# Patient Record
Sex: Male | Born: 1945
Health system: Southern US, Community
[De-identification: ages and names within clinical notes are randomized; demographics above are authoritative.]

## PROBLEM LIST (undated history)

## (undated) DIAGNOSIS — M1990 Unspecified osteoarthritis, unspecified site: Secondary | ICD-10-CM

## (undated) DIAGNOSIS — I10 Essential (primary) hypertension: Secondary | ICD-10-CM

## (undated) DIAGNOSIS — K219 Gastro-esophageal reflux disease without esophagitis: Secondary | ICD-10-CM

## (undated) DIAGNOSIS — I2510 Atherosclerotic heart disease of native coronary artery without angina pectoris: Secondary | ICD-10-CM

## (undated) DIAGNOSIS — K449 Diaphragmatic hernia without obstruction or gangrene: Secondary | ICD-10-CM

## (undated) DIAGNOSIS — E785 Hyperlipidemia, unspecified: Secondary | ICD-10-CM

## (undated) DIAGNOSIS — J189 Pneumonia, unspecified organism: Secondary | ICD-10-CM

## (undated) DIAGNOSIS — R3911 Hesitancy of micturition: Secondary | ICD-10-CM

## (undated) DIAGNOSIS — K222 Esophageal obstruction: Secondary | ICD-10-CM

## (undated) DIAGNOSIS — K4090 Unilateral inguinal hernia, without obstruction or gangrene, not specified as recurrent: Secondary | ICD-10-CM

## (undated) DIAGNOSIS — D126 Benign neoplasm of colon, unspecified: Secondary | ICD-10-CM

## (undated) DIAGNOSIS — B958 Unspecified staphylococcus as the cause of diseases classified elsewhere: Secondary | ICD-10-CM

## (undated) DIAGNOSIS — I4891 Unspecified atrial fibrillation: Secondary | ICD-10-CM

## (undated) DIAGNOSIS — R51 Headache: Secondary | ICD-10-CM

## (undated) DIAGNOSIS — K5790 Diverticulosis of intestine, part unspecified, without perforation or abscess without bleeding: Secondary | ICD-10-CM

## (undated) DIAGNOSIS — I499 Cardiac arrhythmia, unspecified: Secondary | ICD-10-CM

## (undated) DIAGNOSIS — N400 Enlarged prostate without lower urinary tract symptoms: Secondary | ICD-10-CM

## (undated) DIAGNOSIS — R0600 Dyspnea, unspecified: Secondary | ICD-10-CM

## (undated) HISTORY — DX: Gastro-esophageal reflux disease without esophagitis: K21.9

## (undated) HISTORY — PX: CARDIOVASCULAR STRESS TEST: SHX262

## (undated) HISTORY — DX: Benign neoplasm of colon, unspecified: D12.6

## (undated) HISTORY — DX: Diaphragmatic hernia without obstruction or gangrene: K44.9

## (undated) HISTORY — DX: Atherosclerotic heart disease of native coronary artery without angina pectoris: I25.10

## (undated) HISTORY — PX: ESOPHAGEAL DILATION: SHX303

## (undated) HISTORY — PX: COLONOSCOPY: SHX174

## (undated) HISTORY — PX: POLYPECTOMY: SHX149

## (undated) HISTORY — DX: Essential (primary) hypertension: I10

## (undated) HISTORY — DX: Hyperlipidemia, unspecified: E78.5

## (undated) HISTORY — DX: Unspecified atrial fibrillation: I48.91

## (undated) HISTORY — DX: Esophageal obstruction: K22.2

## (undated) HISTORY — DX: Unspecified staphylococcus as the cause of diseases classified elsewhere: B95.8

## (undated) HISTORY — DX: Unspecified osteoarthritis, unspecified site: M19.90

## (undated) HISTORY — PX: US ECHOCARDIOGRAPHY: HXRAD669

## (undated) HISTORY — DX: Diverticulosis of intestine, part unspecified, without perforation or abscess without bleeding: K57.90

## (undated) HISTORY — PX: KNEE SURGERY: SHX244

## (undated) HISTORY — DX: Unilateral inguinal hernia, without obstruction or gangrene, not specified as recurrent: K40.90

---
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
## 2001-12-31 ENCOUNTER — Encounter: Payer: Self-pay | Admitting: Gastroenterology

## 2002-06-30 ENCOUNTER — Encounter: Payer: Self-pay | Admitting: Gastroenterology

## 2004-12-05 HISTORY — PX: ANKLE ARTHROPLASTY: SUR68

## 2004-12-24 ENCOUNTER — Ambulatory Visit: Payer: Self-pay | Admitting: Gastroenterology

## 2004-12-27 ENCOUNTER — Ambulatory Visit: Payer: Self-pay | Admitting: Gastroenterology

## 2004-12-28 ENCOUNTER — Encounter: Payer: Self-pay | Admitting: Gastroenterology

## 2005-03-05 HISTORY — PX: CORONARY ANGIOPLASTY WITH STENT PLACEMENT: SHX49

## 2005-03-10 ENCOUNTER — Ambulatory Visit (HOSPITAL_COMMUNITY): Admission: RE | Admit: 2005-03-10 | Discharge: 2005-03-11 | Payer: Self-pay | Admitting: *Deleted

## 2005-03-10 HISTORY — PX: CORONARY STENT PLACEMENT: SHX1402

## 2005-04-05 ENCOUNTER — Ambulatory Visit: Payer: Self-pay | Admitting: Podiatry

## 2005-11-30 ENCOUNTER — Ambulatory Visit: Admission: RE | Admit: 2005-11-30 | Discharge: 2005-11-30 | Payer: Self-pay | Admitting: Surgery

## 2005-11-30 HISTORY — PX: HERNIA REPAIR: SHX51

## 2006-01-24 ENCOUNTER — Ambulatory Visit: Payer: Self-pay | Admitting: Gastroenterology

## 2006-11-04 HISTORY — PX: UMBILICAL HERNIA REPAIR: SHX196

## 2007-12-17 ENCOUNTER — Emergency Department (HOSPITAL_COMMUNITY): Admission: EM | Admit: 2007-12-17 | Discharge: 2007-12-17 | Payer: Self-pay | Admitting: Emergency Medicine

## 2008-01-15 ENCOUNTER — Ambulatory Visit: Payer: Self-pay | Admitting: Gastroenterology

## 2009-01-22 HISTORY — PX: CARDIOVASCULAR STRESS TEST: SHX262

## 2009-02-10 ENCOUNTER — Telehealth: Payer: Self-pay | Admitting: Gastroenterology

## 2009-02-12 ENCOUNTER — Encounter: Payer: Self-pay | Admitting: Gastroenterology

## 2009-03-02 DIAGNOSIS — I10 Essential (primary) hypertension: Secondary | ICD-10-CM | POA: Insufficient documentation

## 2009-03-02 DIAGNOSIS — K649 Unspecified hemorrhoids: Secondary | ICD-10-CM | POA: Insufficient documentation

## 2009-03-02 DIAGNOSIS — K828 Other specified diseases of gallbladder: Secondary | ICD-10-CM

## 2009-03-02 DIAGNOSIS — K219 Gastro-esophageal reflux disease without esophagitis: Secondary | ICD-10-CM

## 2009-03-02 DIAGNOSIS — K449 Diaphragmatic hernia without obstruction or gangrene: Secondary | ICD-10-CM | POA: Insufficient documentation

## 2009-03-02 DIAGNOSIS — K222 Esophageal obstruction: Secondary | ICD-10-CM

## 2009-03-02 DIAGNOSIS — E119 Type 2 diabetes mellitus without complications: Secondary | ICD-10-CM

## 2009-03-02 DIAGNOSIS — I2510 Atherosclerotic heart disease of native coronary artery without angina pectoris: Secondary | ICD-10-CM | POA: Insufficient documentation

## 2009-03-03 ENCOUNTER — Ambulatory Visit: Payer: Self-pay | Admitting: Gastroenterology

## 2009-03-03 DIAGNOSIS — M129 Arthropathy, unspecified: Secondary | ICD-10-CM | POA: Insufficient documentation

## 2009-03-05 DIAGNOSIS — D126 Benign neoplasm of colon, unspecified: Secondary | ICD-10-CM

## 2009-03-05 HISTORY — DX: Benign neoplasm of colon, unspecified: D12.6

## 2009-04-01 ENCOUNTER — Encounter: Payer: Self-pay | Admitting: Gastroenterology

## 2009-04-01 ENCOUNTER — Ambulatory Visit: Payer: Self-pay | Admitting: Gastroenterology

## 2009-04-03 ENCOUNTER — Encounter: Payer: Self-pay | Admitting: Gastroenterology

## 2009-05-01 ENCOUNTER — Ambulatory Visit: Payer: Self-pay | Admitting: Gastroenterology

## 2011-01-21 ENCOUNTER — Ambulatory Visit (INDEPENDENT_AMBULATORY_CARE_PROVIDER_SITE_OTHER): Payer: BC Managed Care – PPO | Admitting: Cardiology

## 2011-01-21 DIAGNOSIS — E7800 Pure hypercholesterolemia, unspecified: Secondary | ICD-10-CM

## 2011-01-21 DIAGNOSIS — E119 Type 2 diabetes mellitus without complications: Secondary | ICD-10-CM

## 2011-01-21 DIAGNOSIS — I2510 Atherosclerotic heart disease of native coronary artery without angina pectoris: Secondary | ICD-10-CM

## 2011-03-16 LAB — GLUCOSE, CAPILLARY
Glucose-Capillary: 100 mg/dL — ABNORMAL HIGH (ref 70–99)
Glucose-Capillary: 116 mg/dL — ABNORMAL HIGH (ref 70–99)

## 2011-04-19 NOTE — Assessment & Plan Note (Signed)
[facility]                         GASTROENTEROLOGY OFFICE NOTE   NAME:[name], [name]                      MRN:          [id number]  DATE:01/15/2008                            DOB:          12-30-1945    HISTORY:  [name] is doing well, but since I saw him has developed  cardiovascular difficulties that required a cardiac stenting by Dr.  Peter M. Sw[place]iland in April 2006.  He continued with acid reflux symptoms  which is managed by taking Prevacid, but he has had problems with his  insurance company, and has recently been on Omeprazole 20 mg twice  daily, with break-through reflux symptoms.  He does have progressive  solid food dysphagia at this time.  His last endoscopy is greater than  five years ago, and he had esophageal dilatation at that time.   In addition to his hypertension and coronary artery disease, he does  have diabetes. He is on multiple medications, including daily aspirin  and NSAIDs.  I have reviewed his multiple medications, and they are  listed in his chart.  He denies lower GI problems at this time.  He is  up-to-date on his colonoscopy, which was last done in December 2003.   The patient denies any hepatobiliary complaints, melena or hematochezia.  His appetite is good and his weight is stable.   PHYSICAL EXAMINATION:  VITAL SIGNS:  Weight 182 pounds, which is his  normal weight.  Blood pressure 136/70, pulse 80 and regular.  I did not  perform a general physical exam at this time.   RECOMMENDATIONS:  1. Switch from Omeprazole to Prevacid 30 mg daily.  2. He is probably going to be a good candidate for a dual-action, time      release Capodex tablet coming out in the next few weeks from [employer].  3. Outpatient endoscopy and esophageal dilatation for his dysphagia.  4. Continue other multiple medications as listed above.     Vania Rea. Jarold Motto, MD, [name], FAGA  Electronically Signed    DRP/MedQ  DD:  01/15/2008  DT: 01/16/2008  Job #: [id number]   cc:   Peter M. [place]il[place], M.D.  Kari Baars, M.D.

## 2011-04-22 NOTE — Cardiovascular Report (Signed)
[name], [name]               ACCOUNT NO.:  [id number]   MEDICAL RECORD NO.:  [id number]          PATIENT TYPE:  OIB   LOCATION:  2899                         FACILITY:  [facility]   PHYSICIAN:  Elmore Guise., M.D.DATE OF BIRTH:  [birth year]   DATE OF PROCEDURE:  03/10/2005  DATE OF DISCHARGE:                              CARDIAC CATHETERIZATION   PROCEDURE:  Cardiac catheterization.   INDICATIONS FOR PROCEDURE:  Chest pain and abnormal stress test.   PROCEDURE DESCRIPTION:  The patient was brought to the cardiac  catheterization lab after appropriate informed consent.  He was prepped and  draped in a sterile fashion.  Approximately 10 cc of 1% lidocaine was used  for local anesthesia.  A 6 French sheath was placed in the right femoral  artery without difficulty.  Coronary angiography and LV angiography were  then performed.  The patient was given 10 mg of IV labetalol secondary to  blood pressure in the 190-200 range, with a significant improvement in his  blood pressure.   FINDINGS:  1.  Left main:  Normal.  2.  LAD:  Proximal luminal irregularities, with a mid 30-40 stenosis prior      to bifurcation with a large diagonal.  The mid-LAD after the bifurcation      has a 40-50% stenosis, and the diagonal after the bifurcation and has      approximately a 40-50% stenosis.  3.  LCX:  Is codominant.  Proximal to midvessel with mild luminal      irregularities.  Distal circumflex has a 50-60% stenosis prior to      bifurcation to a small PDA.  4.  OM1:  Is a large vessel, with no significant disease.  5.  OM2:  Small to moderate-size vessel, with no significant disease.  6.  RCA:  Is codominant, with proximal luminal irregularities, mid 70-80%      stenosis, followed by a mid to distal 95% stenosis.  PDA and distal RCA      fills retrogradely via left-to-right collaterals.  7.  LV:  EF 50-55%.  LVEDP was 18 mmHg.  No pullback gradient across the      aortic valve noted.   IMPRESSION:  1.  Obstructive RCA disease.  2.  Moderate nonobstructive LCX and LAD disease.  3.  Low normal LV systolic function.   PLAN:  Elective PCI of RCA, and aggressive risk factor modification and  medical therapy.      TWK/MEDQ  D:  03/10/2005  T:  03/10/2005  Job:  [id number]

## 2011-04-22 NOTE — Cardiovascular Report (Signed)
[name], [name]               ACCOUNT NO.:  [id number]   MEDICAL RECORD NO.:  [id number]          PATIENT TYPE:  OIB   LOCATION:  2899                         FACILITY:  [facility]   PHYSICIAN:  Peter M. Swaziland, M.D.  DATE OF BIRTH:  03/22/1946   DATE OF PROCEDURE:  03/10/2005  DATE OF DISCHARGE:                              CARDIAC CATHETERIZATION   INDICATIONS FOR PROCEDURE:  A 65 year old white male with recent symptoms of  atypical chest pain and abnormal Cardiolite study.  Cardiac catheterization  performed by Dr. Reyes Ivan demonstrates moderate nonobstructive disease in LAD  and left circumflex territory.  He has sequential high-grade lesions in the  mid right coronary artery and the crux of the right coronary artery that  appear to be suitable for stenting.   EQUIPMENT USED:  6-French FR4 guide, a 0.014 high-torque floppy wire, 2.5 x  15 mm Maverick balloon, a 3.0 x 13 mm CYPHER stent, and a 3.0 x 18 mm CYPHER  stent.   MEDICATIONS:  Double bolus Integrilin followed by continuous IV infusion.  Heparin 6200 units IV.   CONTRAST:  70 mL of Omnipaque.  The patient was hemodynamically stable  throughout.   The sequential lesions in the right coronary artery were crossed easily with  the wire.  We initially pre dilated both lesions using the 2.5 x 15 mm  Maverick balloon dilating the distal lesion to 6 atmospheres and the more  proximal lesion to 10 atmospheres.  We then proceeded to stent both these  lesions.  The more distal lesion was stented using a 3.0 x 13 mm CYPHER  stent deployed to 11 atmospheres and post dilated to 16 atmospheres.  We  then stented the more proximal lesion using a 3.0 x 18 mm CYPHER dilating to  11 atmospheres for deployment and 16 atmospheres post deployment.  This  yielded excellent angiographic result at both sites with 0% residual  stenosis and TIMI grade 3 flow.   FINAL INTERPRETATION:  Successful stenting of the mid and crux of the right  coronary artery.      PMJ/MEDQ  D:  03/10/2005  T:  03/10/2005  Job:  [id number]   cc:   Lina Sar, M.D.

## 2011-04-22 NOTE — Op Note (Signed)
NAMEMARIO, [name]               ACCOUNT NO.:  [id number]   MEDICAL RECORD NO.:  [id number]          PATIENT TYPE:  AMB   LOCATION:  DFTL                         FACILITY:  [facility]   PHYSICIAN:  Wilmon Arms. Corliss Skains, M.D. DATE OF BIRTH:  04/02/1946   DATE OF PROCEDURE:  11/30/2005  DATE OF DISCHARGE:                                 OPERATIVE REPORT   PREOPERATIVE DIAGNOSIS:  Umbilical hernia.   POSTOPERATIVE DIAGNOSIS:  Umbilical hernia.   PROCEDURE:  Umbilical hernia repair.   SURGEON:  Wilmon Arms. Tsuei, M.D.   ANESTHESIA:  General endotracheal.   INDICATIONS:  The patient is a 65 year old male who presents with a painful  bulge at his umbilicus. On examination, the patient had a small umbilical  hernia. The patient also has a upper midline rectus diastasis, but this does  not appear to be bothering him.   DESCRIPTION OF PROCEDURE:  The patient was brought to the operating room and  placed in the supine position on the operating room table. After an adequate  level of general endotracheal anesthesia was obtained, the patient's abdomen  was prepped with Betadine and draped in a sterile fashion. A time out was  then taken to ensure the proper patient and proper procedure. The area  around the umbilicus was infiltrated with 0.25% Marcaine. A curvilinear  incision was made just above his umbilicus. Dissection was carried down into  the fascia. A hernia sac was encountered, and this was dissected free from  the umbilical skin. The hernia sac was dissected free from the edge of the  fascia and was reduced. The defect was very small, measuring less than 1 cm  in diameter. This was then closed with three interrupted 0 Novofil sutures.  These were tied down, and the defect was felt to be firmly closed. The wound  was closed with a deep layer of 3-0 Vicryl and a subcuticular layer of 4-0  Monocryl. Steri-Strips and a clean dressing were applied. The patient was  extubated and brought to the  recovery room in stable condition. All sponge,  instrument, and needle  counts were correct.      Wilmon Arms. Tsuei, M.D.  Electronically Signed     MKT/MEDQ  D:  11/30/2005  T:  11/30/2005  Job:  [id number]

## 2011-04-22 NOTE — H&P (Signed)
[name], [name]               ACCOUNT NO.:  [id number]   MEDICAL RECORD NO.:  [id number]          PATIENT TYPE:  AMB   LOCATION:  DFTL                         FACILITY:  [facility]   PHYSICIAN:  Wilmon Arms. Corliss Skains, M.D. DATE OF BIRTH:  November 16, 1946   DATE OF ADMISSION:  11/30/2005  DATE OF DISCHARGE:                                HISTORY & PHYSICAL   CHIEF COMPLAINT:  Umbilical hernia.   HISTORY OF PRESENT ILLNESS:  The patient is a 65 year old male with a past  medical history significant for coronary artery disease and diabetes who  presents with an occasional painful bulge in his umbilicus.  Patient notices  this mostly when he is working and bending over.  Patient works for himself  and occasionally has to lift heavy things.  He denies any obstructive  symptoms.   MEDICATIONS:  Metformin, Glipizide, aspirin, Protonix, Toprol, lisinopril,  Zyrtec, Flonase, and Vytorin.   ALLERGIES:  Patient states that he is allergic to HYDROCODONE and OXYCODONE  which make him vomit.   PAST MEDICAL HISTORY:  1.  Hiatal hernia.  2.  Gastroesophageal reflux disease.  3.  Diabetes type 2.  4.  Hypertension.  5.  Angina.  6.  Seasonal allergies.  7.  Hypercholesterolemia.   PAST SURGICAL HISTORY:  Cardiac catheterization with placement with two  stents.   SOCIAL HISTORY:  Nonsmoker.  Nondrinker.   FAMILY HISTORY:  Patient states that his father is deceased with TB of the  bone.  Mother is alive with coronary artery disease and Parkinson's.   REVIEW OF SYSTEMS:  Please see the attached review of systems sheet.   PHYSICAL EXAMINATION:  VITAL SIGNS:  Height 5 feet 9 inches, weight 187,  pulse 42, blood pressure 135/79, temperature 97.5.  GENERAL:  This is a well-developed, well-nourished male in no apparent  distress.  HEENT:  EOMI.  Sclerae anicteric.  LUNGS:  Clear to auscultation.  HEART:  Regular rate and rhythm.  No murmurs.  ABDOMEN:  Soft, nontender.  Good bowel sounds.  Patient  has upper midline  rectus diastasis.  He had a small umbilical hernia which is easily reduced.  Defect feels like it is less than 1 cm in diameter.   IMPRESSION:  1.  Umbilical hernia.  2.  Rectus diastasis.  3.  Coronary artery disease.  4.  Diabetes.   PLAN:  I explained to the patient that there is no good surgery to correct  the rectus diastasis.  However, I think his symptoms are mostly from an  umbilical hernia that may have some incarcerated omental fat.  Recommend a  repair either with primary suture repair or a mesh.  I explained the  benefits, the risks of procedure to the patient.  He understands and wishes  to proceed.      Wilmon Arms. Tsuei, M.D.  Electronically Signed     MKT/MEDQ  D:  11/30/2005  T:  11/30/2005  Job:  [id number]

## 2011-04-22 NOTE — Discharge Summary (Signed)
[name], [name]               ACCOUNT NO.:  [id number]   MEDICAL RECORD NO.:  [id number]          PATIENT TYPE:  OIB   LOCATION:  6532                         FACILITY:  [facility]   PHYSICIAN:  Elmore Guise., M.D.DATE OF BIRTH:  [birth year]   DATE OF ADMISSION:  03/10/2005  DATE OF DISCHARGE:  03/11/2005                                 DISCHARGE SUMMARY   DISCHARGE DIAGNOSES:  1.  Coronary artery disease.  2.  Status post successful PCI and stent placement to the RCA.  3.  Diabetes mellitus.  4.  Dyslipidemia.  5.  Hypertension   HISTORY OF PRESENT ILLNESS:  The patient is a very pleasant 65 year old  white male with a past medical history of diabetes, hypertension,  dyslipidemia who presented for evaluation of off-and-on exertional chest  pain. The patient had cardiac workup including stress Cardiolite showing a  inferior wall defect. The patient was admitted for further observation.   HOSPITAL COURSE:  The patient underwent cardiac cath on March 10, 2005  showing moderate, however nonobstructive disease on the left system with  severe obstructive lesions noted in the mid and distal RCA. He underwent  successful PCI of his RCA lesions. Postprocedure he has done well.  He had  no significant chest pain or shortness of breath after his procedure was  complete. His right femoral area has healed nicely with no hematoma and no  bruit.  He will be discharged home to continue the following medications.   DISCHARGE MEDICATIONS:  1.  Aspirin 81 mg daily.  2.  Plavix 75 mg daily.  3.  Toprol XL 12.5 mg daily.  4.  Altace 2.5 mg daily.  5.  Vytorin mg daily.  6.  Nitroglycerin p.r.n.  7.  Pain management is Tylenol Extra Strength one to two every 6 hours as      needed.   ACTIVITY:  The patient was instructed in no strenuous activity or heavy  lifting times 48 hours.  His diet will be an ADA 2000 calorie diet.  He can  resume his Metformin after 48 hours at his prior dose.  He  will follow up in  two weeks at [facility] at a scheduled appointment on March 25, 2005.  Discharge labs shows a hemoglobin of 14.1, platelets of 127 and BUN  and creatinine of 10, 0.9.     TWK/MEDQ  D:  03/11/2005  T:  03/11/2005  Job:  [id number]

## 2011-06-13 ENCOUNTER — Ambulatory Visit (INDEPENDENT_AMBULATORY_CARE_PROVIDER_SITE_OTHER): Payer: BC Managed Care – PPO | Admitting: Gastroenterology

## 2011-06-13 ENCOUNTER — Encounter: Payer: Self-pay | Admitting: Gastroenterology

## 2011-06-13 VITALS — BP 142/76 | HR 88 | Ht 69.0 in | Wt 163.0 lb

## 2011-06-13 DIAGNOSIS — Z860100 Personal history of colon polyps, unspecified: Secondary | ICD-10-CM | POA: Insufficient documentation

## 2011-06-13 DIAGNOSIS — K219 Gastro-esophageal reflux disease without esophagitis: Secondary | ICD-10-CM

## 2011-06-13 DIAGNOSIS — R195 Other fecal abnormalities: Secondary | ICD-10-CM

## 2011-06-13 MED ORDER — PEG-KCL-NACL-NASULF-NA ASC-C 100 G PO SOLR: 1.0000 | Freq: Once | ORAL | Status: DC

## 2011-06-13 NOTE — Patient Instructions (Signed)
Your procedure has been scheduled for 07/27/2011, please follow the seperate instructions.  Your prescription(s) have been sent to you pharmacy.

## 2011-06-13 NOTE — Progress Notes (Signed)
This is a extremely pleasant 65 year old Caucasian male that'll onset diabetes, coronary artery disease with previous angioplasty, hyperlipidemia, chronic GERD, and a history of chronic colon polyposis. He had a removal of a large polyp 2 years ago. Recent Hemoccult cards were guaiac positive but the patient was not anemic denies any gastrointestinal issues. He has had some voluntary weight loss. His acid reflux is well controlled with Prevacid 15 mg a day.  Current Medications, Allergies, Past Medical History, Past Surgical History, Family History and Social History were reviewed in [employer] record.  Pertinent Review of Systems Negative   Physical Exam: I cannot appreciate stigmata of chronic liver disease. Chest is clear he appears to be in irregular rhythm without murmurs gallops or rubs. There is no organomegaly, abdominal masses or tenderness. Bowel sounds are normal. Rectal exam was deferred. Peripheral extremities are unremarkable mental status is normal.    Assessment and Plan: Probable recurrent colonic polyposis. Recently the patient did have some erosive gastritis at the time of endoscopy, but is not on NSAIDs at this time. I have scheduled colonoscopy at his convenience, and is to continue all other medications per Dr. Clelia Croft. Encounter Diagnosis  Name Primary?  . Personal history of colonic polyps Yes

## 2011-06-14 ENCOUNTER — Encounter: Payer: Self-pay | Admitting: Gastroenterology

## 2011-07-27 ENCOUNTER — Encounter: Payer: Self-pay | Admitting: Gastroenterology

## 2011-07-27 ENCOUNTER — Ambulatory Visit (AMBULATORY_SURGERY_CENTER): Payer: BC Managed Care – PPO | Admitting: Gastroenterology

## 2011-07-27 DIAGNOSIS — Z86010 Personal history of colonic polyps: Secondary | ICD-10-CM

## 2011-07-27 DIAGNOSIS — D126 Benign neoplasm of colon, unspecified: Secondary | ICD-10-CM

## 2011-07-27 DIAGNOSIS — K5730 Diverticulosis of large intestine without perforation or abscess without bleeding: Secondary | ICD-10-CM

## 2011-07-27 DIAGNOSIS — R195 Other fecal abnormalities: Secondary | ICD-10-CM

## 2011-07-27 LAB — GLUCOSE, CAPILLARY: Glucose-Capillary: 112 mg/dL — ABNORMAL HIGH (ref 70–99)

## 2011-07-27 MED ORDER — SODIUM CHLORIDE 0.9 % IV SOLN: 500.0000 mL | INTRAVENOUS | Status: DC

## 2011-07-27 NOTE — Patient Instructions (Signed)
Discharge instructions given with verbal understanding.  Handouts on polyps and diverticulosis given.  Resume previous medications. 

## 2011-07-28 ENCOUNTER — Telehealth: Payer: Self-pay | Admitting: *Deleted

## 2011-07-28 NOTE — Telephone Encounter (Signed)

## 2011-08-01 ENCOUNTER — Encounter: Payer: Self-pay | Admitting: Gastroenterology

## 2011-08-25 LAB — COMPREHENSIVE METABOLIC PANEL
Alkaline Phosphatase: 50
BUN: 15
Chloride: 99
Creatinine, Ser: 1.11
Glucose, Bld: 162 — ABNORMAL HIGH
Potassium: 4.1
Total Bilirubin: 0.8
Total Protein: 6.7

## 2011-08-25 LAB — URINALYSIS, ROUTINE W REFLEX MICROSCOPIC
Ketones, ur: NEGATIVE
Nitrite: NEGATIVE
Protein, ur: NEGATIVE
pH: 8

## 2011-10-17 ENCOUNTER — Encounter (INDEPENDENT_AMBULATORY_CARE_PROVIDER_SITE_OTHER): Payer: Self-pay | Admitting: General Surgery

## 2011-10-17 ENCOUNTER — Encounter (INDEPENDENT_AMBULATORY_CARE_PROVIDER_SITE_OTHER): Payer: Self-pay | Admitting: Surgery

## 2011-10-18 ENCOUNTER — Encounter (INDEPENDENT_AMBULATORY_CARE_PROVIDER_SITE_OTHER): Payer: Self-pay | Admitting: General Surgery

## 2011-10-18 ENCOUNTER — Encounter (INDEPENDENT_AMBULATORY_CARE_PROVIDER_SITE_OTHER): Payer: Self-pay | Admitting: Surgery

## 2011-10-18 ENCOUNTER — Ambulatory Visit (INDEPENDENT_AMBULATORY_CARE_PROVIDER_SITE_OTHER): Payer: BC Managed Care – PPO | Admitting: Surgery

## 2011-10-18 VITALS — BP 166/70 | HR 80 | Temp 97.6°F | Resp 20 | Ht 68.5 in | Wt 155.0 lb

## 2011-10-18 DIAGNOSIS — K4090 Unilateral inguinal hernia, without obstruction or gangrene, not specified as recurrent: Secondary | ICD-10-CM | POA: Insufficient documentation

## 2011-10-18 NOTE — Progress Notes (Signed)
Chief Complaint  Patient presents with  . New Evaluation    eval of RIH     HPI [name] is a 65 y.o. male. This is a former patient of mine status post umbilical hernia repair in 2007. He presents with a one-year history of an enlarging mass in his right groin. This has become very large and uncomfortable. It remains reducible. He denies any obstructive symptoms. The patient was recently seen by Dr. Clelia Croft who referred him back for surgical evaluation. HPI  Past Medical History  Diagnosis Date  . Arthritis   . Diabetes mellitus   . CAD (coronary artery disease)   . Hypertension   . Hemorrhoids   . Esophageal stricture   . GERD (gastroesophageal reflux disease)   . Hiatal hernia   . Arthritis   . 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 Diverticulosis   . Hyperlipidemia   . Staph infection     Left ankle  . Hiatal hernia   . CHF (congestive heart failure)   . Hernia, inguinal, right     Past Surgical History  Procedure Date  . Tonsillectomy 1958  . Coronary angioplasty with stent placement 03/2005  . Knee surgery     Right  . Umbilical hernia repair 11/2006  . Colonoscopy   . Polypectomy   . Hernia repair 11/30/2005    umbilical hernia    Family History  Problem Relation Age of Onset  . Heart disease Mother   . Heart disease Sister   . Heart disease Brother   . Heart disease Maternal Grandfather   . Ovarian cancer Paternal Grandmother   . Colon cancer Neg Hx   . Esophageal cancer Neg Hx   . Stomach cancer Neg Hx     Social History History  Substance Use Topics  . Smoking status: Former Games developer  . Smokeless tobacco: Never Used  . Alcohol Use: No    Allergies  Allergen Reactions  . Codeine   . Ramipril     Current Outpatient Prescriptions  Medication Sig Dispense Refill  . calcium gluconate 500 MG tablet Take 500 mg by mouth daily.        . cetirizine (ZYRTEC) 10 MG tablet Take 10 mg by mouth daily.        . Coenzyme Q10 (CO Q-10 PO) Take by  mouth daily.        Marland Kitchen ezetimibe (ZETIA) 10 MG tablet Take 10 mg by mouth daily.        . fluticasone (FLONASE) 50 MCG/ACT nasal spray Place 2 sprays into the nose daily.        Marland Kitchen glipiZIDE (GLUCOTROL) 5 MG tablet Take 5 mg by mouth 2 (two) times daily before a meal.        . Magnesium (MAGNACAPS) 100 MG CAPS Take 1 capsule by mouth daily.        . metFORMIN (GLUCOPHAGE) 1000 MG tablet Take 1,000 mg by mouth 2 (two) times daily with a meal.        . Tamsulosin HCl (FLOMAX) 0.4 MG CAPS Take 0.4 mg by mouth daily.        . vitamin C (ASCORBIC ACID) 500 MG tablet Take 500 mg by mouth daily.        Marland Kitchen zinc gluconate 50 MG tablet Take 50 mg by mouth daily.        . Aspirin-Acetaminophen (GOODYS BODY PAIN PO) Take by mouth as needed.        . lansoprazole (  PREVACID) 15 MG capsule Take 15 mg by mouth daily.          Review of Systems Review of Systems  Constitutional: Negative for fever, chills and unexpected weight change.  HENT: Negative for hearing loss, congestion, sore throat, trouble swallowing and voice change.   Eyes: Negative for visual disturbance.  Respiratory: Negative for cough and wheezing.   Cardiovascular: Negative for chest pain, palpitations and leg swelling.  Gastrointestinal: Positive for abdominal pain. Negative for nausea, vomiting, diarrhea, constipation, blood in stool, abdominal distention, anal bleeding and rectal pain.  Genitourinary: Positive for difficulty urinating. Negative for hematuria.  Musculoskeletal: Positive for arthralgias.  Skin: Negative for rash and wound.  Neurological: Positive for headaches. Negative for seizures, syncope and weakness.  Hematological: Negative for adenopathy. Bruises/bleeds easily.  Psychiatric/Behavioral: Negative for confusion.    Blood pressure 166/70, pulse 80, temperature 97.6 F (36.4 C), temperature source Temporal, resp. rate 20, height 5' 8.5" (1.74 m), weight 155 lb (70.308 kg).  Physical Exam Physical Exam WDWN in  NAD HEENT:  EOMI, sclera anicteric Neck:  No masses, no thyromegaly Lungs:  CTA bilaterally; normal respiratory effort CV:  Regular rate and rhythm; no murmurs Abd:  +bowel sounds, soft, non-tender, no masses No recurrent umbilical hernia GU:  Bilateral descended testes; very large reducible right inguinal hernia.  No sign of left inguinal hernia Ext:  Well-perfused; no edema Skin:  Warm, dry; no sign of jaundice  Data Reviewed   Assessment    Large reducible right inguinal hernia    Plan    Cardiac clearance from Dr. [place] Recommend right inguinal hernia repair with mesh.   I described the procedure in detail.  The patient was given educational material. We discussed the risks and benefits including but not limited to bleeding, infection, chronic inguinal pain, nerve entrapment, hernia recurrence, mesh complications, hematoma formation, urinary retention, injury to the testicles, numbness in the groin, blood clots, injury to the surrounding structures, and anesthesia risk. We also discussed the typical post operative recovery course.  The likelihood of successful hernia repair and return to normal activity is good.        [name] K. 10/18/2011, 5:18 PM

## 2011-10-18 NOTE — Patient Instructions (Signed)
Hold your aspirin and herbal supplements for five days before surgery.

## 2011-11-03 ENCOUNTER — Encounter: Payer: Self-pay | Admitting: Cardiology

## 2011-11-07 ENCOUNTER — Telehealth (INDEPENDENT_AMBULATORY_CARE_PROVIDER_SITE_OTHER): Payer: Self-pay | Admitting: General Surgery

## 2011-11-07 ENCOUNTER — Encounter: Payer: Self-pay | Admitting: *Deleted

## 2011-11-07 ENCOUNTER — Ambulatory Visit (INDEPENDENT_AMBULATORY_CARE_PROVIDER_SITE_OTHER): Payer: BC Managed Care – PPO | Admitting: Cardiology

## 2011-11-07 ENCOUNTER — Other Ambulatory Visit (INDEPENDENT_AMBULATORY_CARE_PROVIDER_SITE_OTHER): Payer: Self-pay | Admitting: General Surgery

## 2011-11-07 ENCOUNTER — Encounter: Payer: Self-pay | Admitting: Cardiology

## 2011-11-07 VITALS — BP 142/80 | HR 66 | Ht 68.0 in | Wt 166.1 lb

## 2011-11-07 DIAGNOSIS — E785 Hyperlipidemia, unspecified: Secondary | ICD-10-CM | POA: Insufficient documentation

## 2011-11-07 DIAGNOSIS — I10 Essential (primary) hypertension: Secondary | ICD-10-CM | POA: Insufficient documentation

## 2011-11-07 DIAGNOSIS — E119 Type 2 diabetes mellitus without complications: Secondary | ICD-10-CM | POA: Insufficient documentation

## 2011-11-07 DIAGNOSIS — I2510 Atherosclerotic heart disease of native coronary artery without angina pectoris: Secondary | ICD-10-CM

## 2011-11-07 NOTE — Assessment & Plan Note (Signed)
Blood pressure control is acceptable. 

## 2011-11-07 NOTE — Patient Instructions (Signed)
Continue your medications.  We will clear you for hernia surgery.  I will see you again in 1 year.

## 2011-11-07 NOTE — Progress Notes (Signed)
[name] Date of Birth: 10-19-1946 Medical Record #[id number]  History of Present Illness: [name] is seen today for yearly followup and for preoperative clearance for inguinal hernia repair. He reports that he is doing very well from a cardiac standpoint. He denies any chest pain, shortness of breath, or palpitations. He is very active doing his yardwork. He is status post stenting of the right coronary in 2006 with a Cypher stent. His last nuclear stress test in February 2010 showed no ischemia. Ejection fraction was 51%.  Current Outpatient Prescriptions on File Prior to Visit  Medication Sig Dispense Refill  . Aspirin-Acetaminophen (GOODYS BODY PAIN PO) Take by mouth as needed.        . calcium gluconate 500 MG tablet Take 500 mg by mouth daily.        . cetirizine (ZYRTEC) 10 MG tablet Take 10 mg by mouth daily.        . Coenzyme Q10 (CO Q-10 PO) Take by mouth daily.        Marland Kitchen ezetimibe (ZETIA) 10 MG tablet Take 10 mg by mouth daily.        . fluticasone (FLONASE) 50 MCG/ACT nasal spray Place 2 sprays into the nose daily.        Marland Kitchen glipiZIDE (GLUCOTROL) 5 MG tablet Take 5 mg by mouth 2 (two) times daily before a meal.        . lansoprazole (PREVACID) 15 MG capsule Take 15 mg by mouth daily.        . Magnesium (MAGNACAPS) 100 MG CAPS Take 1 capsule by mouth daily.        . metFORMIN (GLUCOPHAGE) 1000 MG tablet Take 1,000 mg by mouth 2 (two) times daily with a meal.        . Tamsulosin HCl (FLOMAX) 0.4 MG CAPS Take 0.4 mg by mouth daily.        . vitamin C (ASCORBIC ACID) 500 MG tablet Take 500 mg by mouth daily.        Marland Kitchen zinc gluconate 50 MG tablet Take 50 mg by mouth daily.          Allergies  Allergen Reactions  . Codeine   . Ramipril     Past Medical History  Diagnosis Date  . Arthritis   . Diabetes mellitus   . CAD (coronary artery disease)   . Hypertension   . Hemorrhoids   . Esophageal stricture   . GERD (gastroesophageal reflux disease)   . Hiatal hernia     . Arthritis   . 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 Diverticulosis   . Hyperlipidemia   . Staph infection     Left ankle  . Hernia, inguinal, right     Past Surgical History  Procedure Date  . Tonsillectomy 1958  . Coronary angioplasty with stent placement 03/2005  . Knee surgery     Right  . Umbilical hernia repair 11/2006  . Colonoscopy   . Polypectomy   . Hernia repair 11/30/2005    umbilical hernia  . Coronary stent placement 03/10/2005    SUCCESSFUL STENTING OF THE MID AND CRUX OF THE RCA  . US echocardiography 02/25/2005    EF 50-55%  . Cardiovascular stress test 01/22/2009    EF 51%, NO ISCHEMIA    History  Smoking status  . Former Smoker  Smokeless tobacco  . Never Used    History  Alcohol Use No    Family History  Problem Relation Age of  Onset  . Heart disease Mother   . Heart disease Sister   . Heart disease Brother   . Heart disease Maternal Grandfather   . Ovarian cancer Paternal Grandmother   . Colon cancer Neg Hx   . Esophageal cancer Neg Hx   . Stomach cancer Neg Hx     Review of Systems: The review of systems is positive for significant bulge from his inguinal hernia on the right. He reports his last cholesterol was 169 with an A1c of 5.7%.  All other systems were reviewed and are negative.  Physical Exam: BP 142/80  Pulse 66  Ht 5\' 8"  (1.727 m)  Wt 166 lb 1.9 oz (75.352 kg)  BMI 25.26 kg/m2 The patient is alert and oriented x 3.  The mood and affect are normal.  The skin is warm and dry.  Color is normal.  The HEENT exam reveals that the sclera are nonicteric.  The mucous membranes are moist.  The carotids are 2+ without bruits.  There is no thyromegaly.  There is no JVD.  The lungs are clear.  The chest wall is non tender.  The heart exam reveals a regular rate with a normal S1 and S2.  There are no murmurs, gallops, or rubs.  The PMI is not displaced.   Abdominal exam reveals good bowel sounds.  There is no guarding or rebound.  There  is no hepatosplenomegaly or tenderness.  There are no masses.  Exam of the legs reveal no clubbing, cyanosis, or edema.  The legs are without rashes.  The distal pulses are intact.  Cranial nerves II - XII are intact.  Motor and sensory functions are intact.  The gait is normal.  LABORATORY DATA: ECG today demonstrates normal sinus rhythm with Q waves in leads 3 and aVF. 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  Assessment / Plan:

## 2011-11-07 NOTE — Telephone Encounter (Signed)
Called pt and he is aware that I got cardiac clearance from Dr Swaziland and that I sent orders up to scheduling

## 2011-11-07 NOTE — Assessment & Plan Note (Signed)
He is status post remote stenting of the right coronary in 2006. His stress Myoview study in February 2010 was unremarkable. He is asymptomatic. I will recommend continued risk factor modification. He will be cleared for his planned hernia repair. I'll plan a followup again in one year.

## 2011-11-09 ENCOUNTER — Encounter: Payer: Self-pay | Admitting: *Deleted

## 2011-11-09 ENCOUNTER — Encounter (INDEPENDENT_AMBULATORY_CARE_PROVIDER_SITE_OTHER): Payer: Self-pay

## 2011-11-21 ENCOUNTER — Encounter (HOSPITAL_COMMUNITY)
Admission: RE | Admit: 2011-11-21 | Discharge: 2011-11-21 | Payer: BC Managed Care – PPO | Source: Ambulatory Visit | Attending: Surgery | Admitting: Surgery

## 2011-11-21 ENCOUNTER — Encounter (HOSPITAL_COMMUNITY): Payer: Self-pay | Admitting: Pharmacy Technician

## 2011-11-21 ENCOUNTER — Encounter (HOSPITAL_COMMUNITY): Payer: Self-pay

## 2011-11-21 ENCOUNTER — Encounter (HOSPITAL_COMMUNITY)
Admission: RE | Admit: 2011-11-21 | Discharge: 2011-11-21 | Disposition: A | Discharge status: Home / Self Care | Payer: BC Managed Care – PPO | Source: Ambulatory Visit | Attending: Anesthesiology | Admitting: Anesthesiology

## 2011-11-21 HISTORY — DX: Cardiac arrhythmia, unspecified: I49.9

## 2011-11-21 HISTORY — DX: Headache: R51

## 2011-11-21 HISTORY — DX: Hesitancy of micturition: R39.11

## 2011-11-21 HISTORY — DX: Benign prostatic hyperplasia without lower urinary tract symptoms: N40.0

## 2011-11-21 LAB — SURGICAL PCR SCREEN: MRSA, PCR: NEGATIVE

## 2011-11-21 LAB — CBC
MCH: 28.6 pg (ref 26.0–34.0)
Platelets: 174 10*3/uL (ref 150–400)
RBC: 4.75 MIL/uL (ref 4.22–5.81)
WBC: 6.8 10*3/uL (ref 4.0–10.5)

## 2011-11-21 LAB — BASIC METABOLIC PANEL
Calcium: 9.9 mg/dL (ref 8.4–10.5)
GFR calc non Af Amer: 86 mL/min — ABNORMAL LOW (ref >90)
Glucose, Bld: 76 mg/dL (ref 70–99)
Sodium: 142 mEq/L (ref 135–145)

## 2011-11-21 NOTE — Pre-Procedure Instructions (Addendum)
20 CHAVIS TESSLER  11/21/2011   Your procedure is scheduled on: 11-28-2011 @ 7:30 AM Report to [facility] at 5:30 AM .  Call this number if you have problems the morning of surgery: [phone]   Remember:   Do not eat food:After Midnight.  May have clear liquids: up to 4 Hours before arrival. Until 1:30AM  Clear liquids include soda, tea, black coffee, apple or grape juice, broth.  Take these medicines the morning of surgery with A SIP OF WATER:flonase, previcid, Metoprolol   Do not wear jewelry, make-up or nail polish.  Do not wear lotions, powders, or perfumes. You may wear deodorant.  Do not shave 48 hours prior to surgery.  Do not bring valuables to the hospital.  Contacts, dentures or bridgework may not be worn into surgery.  Leave suitcase in the car. After surgery it may be brought to your room.  For patients admitted to the hospital, checkout time is 11:00 AM the day of discharge.   Patients discharged the day of surgery will not be allowed to drive home.  Name and phone number of your driver:  Special Instructions: CHG Shower Use Special Wash: 1/2 bottle night before surgery and 1/2 bottle morning of surgery.   Please read over the following fact sheets that you were given: Pain Booklet, Coughing and Deep Breathing, MRSA Information and Surgical Site Infection Prevention

## 2011-11-21 NOTE — Progress Notes (Addendum)
Requested stress test from [facility]. Has cardiac clearance from Dr. Swaziland on file.   Left for anesthesiology to review.

## 2011-11-22 NOTE — Consult Note (Signed)
Anesthesia:  Patient is a 65 year old male scheduled for right IHR on 11/28/11.  His history includes DM2, HTN, esophageal stricture, GERD, HH, HLD, headaches, BPH, former smoker, and CAD s/p RCA Cypher stent 2006.  His last stress test was done in February 2010 and showed diaphragmatic attenuation artifact, primarily fixed inferior wall defect likely secondary to attenuation but old infarct cannot be excluded, normal LV systolic function with EF 51%.  His last echo and cath were in 2006 (copies on chart).  He saw Dr. Swaziland for routine follow-up and surgical clearance on 11/07/11.  EKG then showed SR with PACs and changes c/w inferior infarct, age undetermined.  Labs acceptable for OR.  CXR showing stable mild cardiomegaly and chronic bronchitis, but no definite  active process noted.  Plan to proceed.

## 2011-11-27 MED ORDER — CEFAZOLIN SODIUM 1-5 GM-% IV SOLN
1.0000 g | INTRAVENOUS | Status: AC
  Administered 2011-11-28: 1 g via INTRAVENOUS
  Filled 2011-11-27: qty 50

## 2011-11-28 ENCOUNTER — Other Ambulatory Visit (INDEPENDENT_AMBULATORY_CARE_PROVIDER_SITE_OTHER): Payer: Self-pay | Admitting: Surgery

## 2011-11-28 ENCOUNTER — Ambulatory Visit (HOSPITAL_COMMUNITY): Payer: BC Managed Care – PPO | Admitting: Vascular Surgery

## 2011-11-28 ENCOUNTER — Encounter (HOSPITAL_COMMUNITY): Payer: Self-pay | Admitting: Vascular Surgery

## 2011-11-28 ENCOUNTER — Ambulatory Visit (HOSPITAL_COMMUNITY)
Admission: RE | Admit: 2011-11-28 | Discharge: 2011-11-28 | Disposition: A | Discharge status: Home / Self Care | Payer: BC Managed Care – PPO | Source: Ambulatory Visit | Attending: Surgery | Admitting: Surgery

## 2011-11-28 ENCOUNTER — Encounter (HOSPITAL_COMMUNITY): Payer: Self-pay | Admitting: *Deleted

## 2011-11-28 ENCOUNTER — Encounter (HOSPITAL_COMMUNITY)
Admission: RE | Disposition: A | Discharge status: Home / Self Care | Payer: Self-pay | Source: Ambulatory Visit | Attending: Surgery

## 2011-11-28 DIAGNOSIS — I2510 Atherosclerotic heart disease of native coronary artery without angina pectoris: Secondary | ICD-10-CM | POA: Insufficient documentation

## 2011-11-28 DIAGNOSIS — I4891 Unspecified atrial fibrillation: Secondary | ICD-10-CM | POA: Insufficient documentation

## 2011-11-28 DIAGNOSIS — E119 Type 2 diabetes mellitus without complications: Secondary | ICD-10-CM | POA: Insufficient documentation

## 2011-11-28 DIAGNOSIS — Z01812 Encounter for preprocedural laboratory examination: Secondary | ICD-10-CM | POA: Insufficient documentation

## 2011-11-28 DIAGNOSIS — K449 Diaphragmatic hernia without obstruction or gangrene: Secondary | ICD-10-CM | POA: Insufficient documentation

## 2011-11-28 DIAGNOSIS — I10 Essential (primary) hypertension: Secondary | ICD-10-CM | POA: Insufficient documentation

## 2011-11-28 DIAGNOSIS — K4090 Unilateral inguinal hernia, without obstruction or gangrene, not specified as recurrent: Secondary | ICD-10-CM

## 2011-11-28 DIAGNOSIS — Z01818 Encounter for other preprocedural examination: Secondary | ICD-10-CM | POA: Insufficient documentation

## 2011-11-28 DIAGNOSIS — K219 Gastro-esophageal reflux disease without esophagitis: Secondary | ICD-10-CM | POA: Insufficient documentation

## 2011-11-28 HISTORY — PX: INGUINAL HERNIA REPAIR: SHX194

## 2011-11-28 LAB — GLUCOSE, CAPILLARY: Glucose-Capillary: 104 mg/dL — ABNORMAL HIGH (ref 70–99)

## 2011-11-28 SURGERY — REPAIR, HERNIA, INGUINAL, ADULT
Anesthesia: General | Site: Groin | Laterality: Right | Wound class: Clean

## 2011-11-28 MED ORDER — ONDANSETRON HCL 4 MG/2ML IJ SOLN
INTRAMUSCULAR | Status: DC | PRN
  Administered 2011-11-28: 4 mg via INTRAVENOUS

## 2011-11-28 MED ORDER — PROPOFOL 10 MG/ML IV EMUL
INTRAVENOUS | Status: DC | PRN
  Administered 2011-11-28: 40 mg via INTRAVENOUS
  Administered 2011-11-28: 50 mg via INTRAVENOUS
  Administered 2011-11-28: 200 mg via INTRAVENOUS

## 2011-11-28 MED ORDER — EPHEDRINE SULFATE 50 MG/ML IJ SOLN
INTRAMUSCULAR | Status: DC | PRN
  Administered 2011-11-28 (×5): 5 mg via INTRAVENOUS

## 2011-11-28 MED ORDER — LACTATED RINGERS IV SOLN
INTRAVENOUS | Status: DC | PRN
  Administered 2011-11-28 (×2): via INTRAVENOUS

## 2011-11-28 MED ORDER — OXYCODONE-ACETAMINOPHEN 5-325 MG PO TABS: 1.0000 | ORAL_TABLET | ORAL | Status: DC | PRN

## 2011-11-28 MED ORDER — DEXTROSE 50 % IV SOLN
INTRAVENOUS | Status: DC | PRN
  Administered 2011-11-28: 12.5 mL via INTRAVENOUS

## 2011-11-28 MED ORDER — OXYCODONE-ACETAMINOPHEN 5-325 MG PO TABS: 1.0000 | ORAL_TABLET | ORAL | Status: AC | PRN

## 2011-11-28 MED ORDER — HYDROMORPHONE HCL PF 1 MG/ML IJ SOLN: 1.0000 mg | INTRAMUSCULAR | Status: DC | PRN

## 2011-11-28 MED ORDER — DROPERIDOL 2.5 MG/ML IJ SOLN
INTRAMUSCULAR | Status: DC | PRN
  Administered 2011-11-28: 0.625 mg via INTRAVENOUS

## 2011-11-28 MED ORDER — TRAMADOL HCL 50 MG PO TABS: 50.0000 mg | ORAL_TABLET | Freq: Four times a day (QID) | ORAL | Status: DC | PRN

## 2011-11-28 MED ORDER — ONDANSETRON HCL 4 MG/2ML IJ SOLN: 4.0000 mg | INTRAMUSCULAR | Status: DC | PRN

## 2011-11-28 MED ORDER — ONDANSETRON HCL 4 MG/2ML IJ SOLN: 4.0000 mg | Freq: Once | INTRAMUSCULAR | Status: DC | PRN

## 2011-11-28 MED ORDER — LIDOCAINE HCL (CARDIAC) 20 MG/ML IV SOLN
INTRAVENOUS | Status: DC | PRN
  Administered 2011-11-28: 50 mg via INTRAVENOUS

## 2011-11-28 MED ORDER — BUPIVACAINE LIPOSOME 1.3 % IJ SUSP
20.0000 mL | INTRAMUSCULAR | Status: AC
  Administered 2011-11-28: 20 mL
  Filled 2011-11-28: qty 20

## 2011-11-28 MED ORDER — HYDROMORPHONE HCL PF 1 MG/ML IJ SOLN
0.2500 mg | INTRAMUSCULAR | Status: DC | PRN
  Administered 2011-11-28 (×2): 0.5 mg via INTRAVENOUS

## 2011-11-28 MED ORDER — 0.9 % SODIUM CHLORIDE (POUR BTL) OPTIME
TOPICAL | Status: DC | PRN
  Administered 2011-11-28: 1000 mL

## 2011-11-28 MED ORDER — FENTANYL CITRATE 0.05 MG/ML IJ SOLN
INTRAMUSCULAR | Status: DC | PRN
  Administered 2011-11-28: 25 ug via INTRAVENOUS
  Administered 2011-11-28: 50 ug via INTRAVENOUS
  Administered 2011-11-28: 25 ug via INTRAVENOUS
  Administered 2011-11-28 (×3): 50 ug via INTRAVENOUS

## 2011-11-28 MED ORDER — SCOPOLAMINE 1 MG/3DAYS TD PT72
MEDICATED_PATCH | TRANSDERMAL | Status: DC | PRN
  Administered 2011-11-28: 1 via TRANSDERMAL

## 2011-11-28 SURGICAL SUPPLY — 53 items
APL SKNCLS STERI-STRIP NONHPOA (GAUZE/BANDAGES/DRESSINGS) ×1
BENZOIN TINCTURE PRP APPL 2/3 (GAUZE/BANDAGES/DRESSINGS) ×2 IMPLANT
BLADE SURG 15 STRL LF DISP TIS (BLADE) ×1 IMPLANT
BLADE SURG 15 STRL SS (BLADE) ×2
BLADE SURG ROTATE 9660 (MISCELLANEOUS) ×1 IMPLANT
CHLORAPREP W/TINT 26ML (MISCELLANEOUS) ×2 IMPLANT
CLOTH BEACON ORANGE TIMEOUT ST (SAFETY) ×2 IMPLANT
COVER SURGICAL LIGHT HANDLE (MISCELLANEOUS) ×2 IMPLANT
DECANTER SPIKE VIAL GLASS SM (MISCELLANEOUS) ×2 IMPLANT
DRAIN PENROSE 1/2X12 LTX STRL (WOUND CARE) ×1 IMPLANT
DRAPE LAPAROSCOPIC ABDOMINAL (DRAPES) IMPLANT
DRAPE LAPAROTOMY TRNSV 102X78 (DRAPE) ×1 IMPLANT
DRAPE UTILITY 15X26 W/TAPE STR (DRAPE) ×4 IMPLANT
DRSG TEGADERM 4X4.75 (GAUZE/BANDAGES/DRESSINGS) ×2 IMPLANT
ELECT CAUTERY BLADE 6.4 (BLADE) ×2 IMPLANT
ELECT REM PT RETURN 9FT ADLT (ELECTROSURGICAL) ×2
ELECTRODE REM PT RTRN 9FT ADLT (ELECTROSURGICAL) ×1 IMPLANT
GAUZE SPONGE 4X4 16PLY XRAY LF (GAUZE/BANDAGES/DRESSINGS) ×2 IMPLANT
GLOVE BIO SURGEON STRL SZ7 (GLOVE) ×2 IMPLANT
GLOVE BIO SURGEON STRL SZ7.5 (GLOVE) ×2 IMPLANT
GLOVE BIOGEL PI IND STRL 7.0 (GLOVE) IMPLANT
GLOVE BIOGEL PI IND STRL 7.5 (GLOVE) ×2 IMPLANT
GLOVE BIOGEL PI INDICATOR 7.0 (GLOVE) ×1
GLOVE BIOGEL PI INDICATOR 7.5 (GLOVE) ×2
GLOVE SURG SS PI 6.5 STRL IVOR (GLOVE) ×1 IMPLANT
GOWN STRL NON-REIN LRG LVL3 (GOWN DISPOSABLE) ×5 IMPLANT
KIT BASIN OR (CUSTOM PROCEDURE TRAY) ×2 IMPLANT
KIT ROOM TURNOVER OR (KITS) ×2 IMPLANT
MESH ULTRAPRO 3X6 7.6X15CM (Mesh General) ×1 IMPLANT
NDL HYPO 25GX1X1/2 BEV (NEEDLE) ×1 IMPLANT
NEEDLE HYPO 25GX1X1/2 BEV (NEEDLE) ×2 IMPLANT
NS IRRIG 1000ML POUR BTL (IV SOLUTION) ×2 IMPLANT
PACK SURGICAL SETUP 50X90 (CUSTOM PROCEDURE TRAY) ×2 IMPLANT
PAD ARMBOARD 7.5X6 YLW CONV (MISCELLANEOUS) ×2 IMPLANT
PENCIL BUTTON HOLSTER BLD 10FT (ELECTRODE) ×2 IMPLANT
SPECIMEN JAR SMALL (MISCELLANEOUS) IMPLANT
SPONGE GAUZE 4X4 12PLY (GAUZE/BANDAGES/DRESSINGS) ×2 IMPLANT
SPONGE INTESTINAL PEANUT (DISPOSABLE) ×2 IMPLANT
STRIP CLOSURE SKIN 1/2X4 (GAUZE/BANDAGES/DRESSINGS) ×2 IMPLANT
SUT MNCRL AB 4-0 PS2 18 (SUTURE) ×2 IMPLANT
SUT PDS AB 0 CT 36 (SUTURE) IMPLANT
SUT PROLENE 2 0 SH DA (SUTURE) IMPLANT
SUT SILK 2 0 SH (SUTURE) IMPLANT
SUT SILK 3 0 (SUTURE) ×2
SUT SILK 3-0 18XBRD TIE 12 (SUTURE) ×1 IMPLANT
SUT VIC AB 2-0 SH 27 (SUTURE) ×4
SUT VIC AB 2-0 SH 27X BRD (SUTURE) ×2 IMPLANT
SUT VIC AB 3-0 SH 27 (SUTURE) ×2
SUT VIC AB 3-0 SH 27XBRD (SUTURE) ×1 IMPLANT
SUT VICRYL 0 UR6 27IN ABS (SUTURE) ×2 IMPLANT
SYR CONTROL 10ML LL (SYRINGE) ×2 IMPLANT
TOWEL OR 17X24 6PK STRL BLUE (TOWEL DISPOSABLE) ×2 IMPLANT
TOWEL OR 17X26 10 PK STRL BLUE (TOWEL DISPOSABLE) ×2 IMPLANT

## 2011-11-28 NOTE — Anesthesia Postprocedure Evaluation (Signed)
  Anesthesia Post-op Note  Patient: [name]  Procedure(s) Performed:  HERNIA REPAIR INGUINAL ADULT - right inguinal hernia repair with mesh  Patient Location: PACU  Anesthesia Type: General  Level of Consciousness: awake, alert , oriented and patient cooperative  Airway and Oxygen Therapy: Patient Spontanous Breathing and Patient connected to nasal cannula oxygen  Post-op Pain: mild  Post-op Assessment: Post-op Vital signs reviewed, Patient's Cardiovascular Status Stable, Respiratory Function Stable, Patent Airway, No signs of Nausea or vomiting and Pain level controlled  Post-op Vital Signs: stable  Complications: No apparent anesthesia complications

## 2011-11-28 NOTE — H&P (Signed)
H&P     Chief Complaint   Patient presents with   .  New Evaluation       eval of RIH         HPI [name] is a 65 y.o. male. This is a former patient of mine status post umbilical hernia repair in 2007. He presents with a one-year history of an enlarging mass in his right groin. This has become very large and uncomfortable. It remains reducible. He denies any obstructive symptoms. The patient was recently seen by Dr. Clelia Croft who referred him back for surgical evaluation. HPI    Past Medical History   Diagnosis  Date   .  Arthritis     .  Diabetes mellitus     .  CAD (coronary artery disease)     .  Hypertension     .  Hemorrhoids     .  Esophageal stricture     .  GERD (gastroesophageal reflux disease)     .  Hiatal hernia     .  Arthritis     .  Tubulovillous adenoma of colon  03/2009   .  Diverticulosis     .  Hyperlipidemia     .  Staph infection         Left ankle   .  Hiatal hernia     .  CHF (congestive heart failure)     .  Hernia, inguinal, right           Past Surgical History   Procedure  Date   .  Tonsillectomy  1958   .  Coronary angioplasty with stent placement  03/2005   .  Knee surgery         Right   .  Umbilical hernia repair  11/2006   .  Colonoscopy     .  Polypectomy     .  Hernia repair  11/30/2005       umbilical hernia         Family History   Problem  Relation  Age of Onset   .  Heart disease  Mother     .  Heart disease  Sister     .  Heart disease  Brother     .  Heart disease  Maternal Grandfather     .  Ovarian cancer  Paternal Grandmother     .  Colon cancer  Neg Hx     .  Esophageal cancer  Neg Hx     .  Stomach cancer  Neg Hx          Social History History   Substance Use Topics   .  Smoking status:  Former Games developer   .  Smokeless tobacco:  Never Used   .  Alcohol Use:  No         Allergies   Allergen  Reactions   .  Codeine     .  Ramipril           Current Outpatient Prescriptions     Medication  Sig  Dispense  Refill   .  calcium gluconate 500 MG tablet  Take 500 mg by mouth daily.           .  cetirizine (ZYRTEC) 10 MG tablet  Take 10 mg by mouth daily.           .  Coenzyme Q10 (CO Q-10 PO)  Take by mouth  daily.           .  ezetimibe (ZETIA) 10 MG tablet  Take 10 mg by mouth daily.           .  fluticasone (FLONASE) 50 MCG/ACT nasal spray  Place 2 sprays into the nose daily.           Marland Kitchen  glipiZIDE (GLUCOTROL) 5 MG tablet  Take 5 mg by mouth 2 (two) times daily before a meal.           .  Magnesium (MAGNACAPS) 100 MG CAPS  Take 1 capsule by mouth daily.           .  metFORMIN (GLUCOPHAGE) 1000 MG tablet  Take 1,000 mg by mouth 2 (two) times daily with a meal.           .  Tamsulosin HCl (FLOMAX) 0.4 MG CAPS  Take 0.4 mg by mouth daily.           .  vitamin C (ASCORBIC ACID) 500 MG tablet  Take 500 mg by mouth daily.           Marland Kitchen  zinc gluconate 50 MG tablet  Take 50 mg by mouth daily.           .  Aspirin-Acetaminophen (GOODYS BODY PAIN PO)  Take by mouth as needed.           .  lansoprazole (PREVACID) 15 MG capsule  Take 15 mg by mouth daily.                Review of Systems Review of Systems  Constitutional: Negative for fever, chills and unexpected weight change.  HENT: Negative for hearing loss, congestion, sore throat, trouble swallowing and voice change.   Eyes: Negative for visual disturbance.  Respiratory: Negative for cough and wheezing.   Cardiovascular: Negative for chest pain, palpitations and leg swelling.  Gastrointestinal: Positive for abdominal pain. Negative for nausea, vomiting, diarrhea, constipation, blood in stool, abdominal distention, anal bleeding and rectal pain.  Genitourinary: Positive for difficulty urinating. Negative for hematuria.  Musculoskeletal: Positive for arthralgias.  Skin: Negative for rash and wound.  Neurological: Positive for headaches. Negative for seizures, syncope and weakness.  Hematological: Negative for adenopathy.  Bruises/bleeds easily.  Psychiatric/Behavioral: Negative for confusion.     , height 5' 8.5" (1.74 m), weight 155 lb (70.308 kg).   Physical Exam WDWN in NAD HEENT:  EOMI, sclera anicteric Neck:  No masses, no thyromegaly Lungs:  CTA bilaterally; normal respiratory effort CV:  Regular rate and rhythm; no murmurs Abd:  +bowel sounds, soft, non-tender, no masses No recurrent umbilical hernia GU:  Bilateral descended testes; very large reducible right inguinal hernia.  No sign of left inguinal hernia Ext:  Well-perfused; no edema Skin:  Warm, dry; no sign of jaundice        Assessment    Large reducible right inguinal hernia     Plan    Cardiac clearance from Dr. Swaziland - received Recommend right inguinal hernia repair with mesh.   I described the procedure in detail.  The patient was given educational material. We discussed the risks and benefits including but not limited to bleeding, infection, chronic inguinal pain, nerve entrapment, hernia recurrence, mesh complications, hematoma formation, urinary retention, injury to the testicles, numbness in the groin, blood clots, injury to the surrounding structures, and anesthesia risk. We also discussed the typical post operative recovery course.  The likelihood of successful hernia repair and return  to normal activity is good.            [name]. Corliss Skains, MD, [facility]  11/28/2011 7:18 AM

## 2011-11-28 NOTE — Preoperative (Signed)
Beta Blockers   Reason not to administer Beta Blockers:Not Applicable 

## 2011-11-28 NOTE — Op Note (Signed)
Hernia, Open, Procedure Note  Indications: The patient presented with a history of a right, reducible inguinal hernia.    Pre-operative Diagnosis: right reducible inguinal hernia   Post-operative Diagnosis: same  Surgeon: Wynona Luna.   Assistants: none   Anesthesia: General LMA anesthesia  ASA Class: 3   Procedure Details  The patient was seen again in the Holding Room. The risks, benefits, complications, treatment options, and expected outcomes were discussed with the patient. The possibilities of reaction to medication, pulmonary aspiration, perforation of viscus, bleeding, recurrent infection, the need for additional procedures, and development of a complication requiring transfusion or further operation were discussed with the patient and/or family. The likelihood of success in repairing the hernia and returning the patient to their previous functional status is good.  There was concurrence with the proposed plan, and informed consent was obtained. The site of surgery was properly noted/marked. The patient was taken to the Operating Room, identified as [name], and the procedure verified as right inguinal hernia repair. A Time Out was held and the above information confirmed.  The patient was placed in the supine position and underwent induction of anesthesia. The lower abdomen and groin was prepped with Chloraprep and draped in the standard fashion, and 0.5% Marcaine with epinephrine was used to anesthetize the skin over the mid-portion of the inguinal canal. An oblique incision was made. Dissection was carried down through the subcutaneous tissue with cautery to the external oblique fascia.  We opened the external oblique fascia along the direction of its fibers to the external ring.  The spermatic cord was circumferentially dissected bluntly and retracted with a Penrose drain.  The floor of the inguinal canal was inspected and was lax.  We skeletonized the spermatic cord and  reduced a large indirect hernia sac.  The internal ring was tightened with a 2-0 Vicryl.  The floor of the inguinal canal was reinforced with a 0 Vicryl.  We used a 3 x 6 inch piece of Ultrapro mesh, which was cut into a keyhole shape.  This was secured with 2-0 Prolene, beginning at the pubic tubercle, running this along the internal oblique fascia superiorly and the shelving edge inferiorly.  The tails of the mesh were sutured together behind the spermatic cord.  The mesh was tucked underneath the external oblique fascia laterally.  The external oblique fascia was reapproximated with 2-0 Vicryl.  3-0 Vicryl was used to close the subcutaneous tissues and 4-0 Monocryl was used to close the skin in subcuticular fashion.  Benzoin and steri-strips were used to seal the incision.  A clean dressing was applied.  The patient was then extubated and brought to the recovery room in stable condition.  All sponge, instrument, and needle counts were correct prior to closure and at the conclusion of the case.   Estimated Blood Loss: Minimal                 Complications: None; patient tolerated the procedure well.         Disposition: PACU - hemodynamically stable.         Condition: stable

## 2011-11-28 NOTE — Anesthesia Preprocedure Evaluation (Addendum)
Anesthesia Evaluation  Patient identified by MRN, date of birth, ID band Patient awake and Patient confused    Reviewed: Allergy & Precautions, H&P , NPO status , Patient's Chart, lab work & pertinent test results, reviewed documented beta blocker date and time   History of Anesthesia Complications Negative for: history of anesthetic complications  Airway Mallampati: I TM Distance: >3 FB Neck ROM: full    Dental  (+) Teeth Intact and Dental Advisory Given   Pulmonary    Pulmonary exam normal       Cardiovascular hypertension, Pt. on home beta blockers + CAD (stents placed) + dysrhythmias Atrial Fibrillation regular Normal    Neuro/Psych  Headaches,    GI/Hepatic hiatal hernia, GERD-  Controlled,  Endo/Other  Diabetes mellitus-, Well Controlled, Type 2  Renal/GU      Musculoskeletal   Abdominal   Peds  Hematology   Anesthesia Other Findings   Reproductive/Obstetrics                         Anesthesia Physical Anesthesia Plan  ASA: III  Anesthesia Plan: General   Post-op Pain Management:    Induction: Intravenous  Airway Management Planned: LMA  Additional Equipment:   Intra-op Plan:   Post-operative Plan: Extubation in OR  Informed Consent:   Plan Discussed with: CRNA, Anesthesiologist and Surgeon  Anesthesia Plan Comments:         Anesthesia Quick Evaluation

## 2011-11-28 NOTE — Anesthesia Procedure Notes (Signed)
Procedure Name: LMA Insertion Date/Time: 11/28/2011 7:29 AM Performed by: Romie Minus Pre-anesthesia Checklist: Patient identified, Emergency Drugs available, Suction available and Patient being monitored Patient Re-evaluated:Patient Re-evaluated prior to inductionOxygen Delivery Method: Circle System Utilized Preoxygenation: Pre-oxygenation with 100% oxygen Intubation Type: IV induction Ventilation: Mask ventilation without difficulty LMA: LMA with gastric port inserted LMA Size: 4.0 Number of attempts: 1 Tube secured with: Tape Dental Injury: Teeth and Oropharynx as per pre-operative assessment

## 2011-11-28 NOTE — Progress Notes (Signed)
Dr. Jean Rosenthal notified of patient's CBG. OK to send to holding.

## 2011-11-28 NOTE — Transfer of Care (Signed)
Immediate Anesthesia Transfer of Care Note  Patient: [name]  Procedure(s) Performed:  HERNIA REPAIR INGUINAL ADULT - right inguinal hernia repair with mesh  Patient Location: PACU  Anesthesia Type: General  Level of Consciousness: awake  Airway & Oxygen Therapy: Patient Spontanous Breathing and Patient connected to nasal cannula oxygen  Post-op Assessment: Report given to PACU RN and Post -op Vital signs reviewed and stable  Post vital signs: stable  Complications: No apparent anesthesia complications

## 2011-11-30 LAB — GLUCOSE, CAPILLARY: Glucose-Capillary: 104 mg/dL — ABNORMAL HIGH (ref 70–99)

## 2011-12-01 ENCOUNTER — Encounter (HOSPITAL_COMMUNITY): Payer: Self-pay | Admitting: Surgery

## 2011-12-02 ENCOUNTER — Telehealth (INDEPENDENT_AMBULATORY_CARE_PROVIDER_SITE_OTHER): Payer: Self-pay

## 2011-12-02 NOTE — Telephone Encounter (Signed)
HERNIA REPAIR INGUINAL ON 11/28/2011- C/O pain at the right side of incision, a burning sensation-  patient reports being active, reduced swelling, no drainage, no bleeding, no fever. Bowel movements ok, urine stream ok but some residual.  (Reports taking  Flomax for urinary issues).  Patient does not take pain medicine due to nausea.  Per Dr. Johna Sheriff surgery still fresh expected pain. Patient advised to try OTC pain meds - tylenol, or  Advil continue with ice pack-- Patient advised to call back if further advise needed.

## 2011-12-14 ENCOUNTER — Other Ambulatory Visit: Payer: Self-pay | Admitting: *Deleted

## 2011-12-14 MED ORDER — METOPROLOL SUCCINATE ER 25 MG PO TB24: 37.5000 mg | ORAL_TABLET | Freq: Every day | ORAL | Status: DC

## 2011-12-20 ENCOUNTER — Ambulatory Visit (INDEPENDENT_AMBULATORY_CARE_PROVIDER_SITE_OTHER): Payer: BC Managed Care – PPO | Admitting: Surgery

## 2011-12-20 ENCOUNTER — Encounter (INDEPENDENT_AMBULATORY_CARE_PROVIDER_SITE_OTHER): Payer: Self-pay | Admitting: Surgery

## 2011-12-20 VITALS — BP 130/74 | HR 80 | Temp 97.8°F | Resp 18 | Ht 69.0 in | Wt 172.0 lb

## 2011-12-20 DIAGNOSIS — K4090 Unilateral inguinal hernia, without obstruction or gangrene, not specified as recurrent: Secondary | ICD-10-CM

## 2011-12-20 NOTE — Progress Notes (Signed)
S/p right inguinal hernia repair with mesh on 11/28/11 for an indirect hernia.  Doing well.  Minimal pain after surgery.  No sign of infection or recurrent hernia.  He may increase his level of activity slowly over the next 3 weeks and resume full activity in 3 weeks.  Follow-up PRN  Wilmon Arms. Corliss Skains, MD, [facility]  12/20/2011 11:22 AM

## 2011-12-20 NOTE — Patient Instructions (Signed)
You may slowly increase your level of activity over the next three weeks and resume full activity in 3 weeks.    Follow-up PRN

## 2012-08-14 ENCOUNTER — Other Ambulatory Visit: Payer: Self-pay | Admitting: Cardiology

## 2012-08-14 MED ORDER — METOPROLOL SUCCINATE ER 25 MG PO TB24: 37.5000 mg | ORAL_TABLET | Freq: Every day | ORAL | Status: DC

## 2012-08-23 ENCOUNTER — Other Ambulatory Visit: Payer: Self-pay | Admitting: *Deleted

## 2012-08-23 MED ORDER — METOPROLOL SUCCINATE ER 25 MG PO TB24: 37.5000 mg | ORAL_TABLET | Freq: Every day | ORAL | Status: DC

## 2012-08-23 NOTE — Telephone Encounter (Signed)
Refilled metoprolol 

## 2013-08-27 ENCOUNTER — Other Ambulatory Visit: Payer: Self-pay

## 2013-08-27 MED ORDER — METOPROLOL SUCCINATE ER 25 MG PO TB24: 37.5000 mg | ORAL_TABLET | Freq: Every day | ORAL | Status: DC

## 2014-03-08 ENCOUNTER — Other Ambulatory Visit: Payer: Self-pay | Admitting: Cardiology

## 2014-04-07 ENCOUNTER — Other Ambulatory Visit: Payer: Self-pay | Admitting: Cardiology

## 2014-05-09 ENCOUNTER — Telehealth: Payer: Self-pay | Admitting: *Deleted

## 2014-05-09 ENCOUNTER — Other Ambulatory Visit: Payer: Self-pay | Admitting: Cardiology

## 2014-05-09 NOTE — Telephone Encounter (Signed)
Can you please schedule this patient for an appointment so that I can refill her medication? Thanks, MI

## 2014-06-02 NOTE — Telephone Encounter (Signed)
New Message  I have called this pt and left numerous voicemails to schedule for refills. Pt has not returned my calls.

## 2014-07-18 ENCOUNTER — Encounter: Payer: Self-pay | Admitting: Internal Medicine

## 2014-11-17 ENCOUNTER — Ambulatory Visit (INDEPENDENT_AMBULATORY_CARE_PROVIDER_SITE_OTHER): Payer: BC Managed Care – PPO

## 2014-11-17 ENCOUNTER — Ambulatory Visit (INDEPENDENT_AMBULATORY_CARE_PROVIDER_SITE_OTHER): Payer: BC Managed Care – PPO | Admitting: Podiatry

## 2014-11-17 ENCOUNTER — Encounter: Payer: Self-pay | Admitting: Podiatry

## 2014-11-17 VITALS — BP 132/69 | HR 64 | Resp 16 | Ht 69.0 in | Wt 145.0 lb

## 2014-11-17 DIAGNOSIS — E119 Type 2 diabetes mellitus without complications: Secondary | ICD-10-CM

## 2014-11-17 DIAGNOSIS — L02619 Cutaneous abscess of unspecified foot: Secondary | ICD-10-CM

## 2014-11-17 DIAGNOSIS — L600 Ingrowing nail: Secondary | ICD-10-CM

## 2014-11-17 DIAGNOSIS — L03119 Cellulitis of unspecified part of limb: Secondary | ICD-10-CM

## 2014-11-17 DIAGNOSIS — Q828 Other specified congenital malformations of skin: Secondary | ICD-10-CM

## 2014-11-17 MED ORDER — CEPHALEXIN 500 MG PO CAPS: 500.0000 mg | ORAL_CAPSULE | Freq: Three times a day (TID) | ORAL | Status: DC

## 2014-11-17 MED ORDER — NEOMYCIN-POLYMYXIN-HC 3.5-10000-1 OT SOLN: OTIC | Status: DC

## 2014-11-17 NOTE — Progress Notes (Signed)
   Subjective:    Patient ID: [name], male    DOB: 1946/07/04, 68 y.o.   MRN: [id number]  HPI Comments: My right foot pain, i have something on the bottom of the heel , porokeratosis. And my  Two toes hurt . Right #2 and Right # 3 toenail , i think it is ingrown. Diabetes . Last a1c 6.1   Foot Pain      Review of Systems  HENT: Positive for hearing loss.        Sinus problems  Ringing in ears   Gastrointestinal: Positive for diarrhea.  Endocrine:       Diabetes   Musculoskeletal: Positive for back pain.  Allergic/Immunologic: Positive for environmental allergies and food allergies.  Hematological: Bruises/bleeds easily.  All other systems reviewed and are negative.      Objective:   Physical Exam: I have reviewed his past medical history medications out surgery social history reduces. Pulses are strongly palpable. Neurologic sensory is intact for some lusty monofilament. Degenerative flexors are intact bilateral muscles and was 5 over 5 dorsiflexion plantar flexion and inversion everters on physical musculatures intact. Orthopedic evaluation shows all joints is full range of motion without crepitation. 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  Plan: Chemical matrixectomy fibular border third digit right foot tolerated well after local anesthesia was administered. Debridement reactive hyperkeratosis plantar aspect of the left midfoot. A salicylic acid under occlusion will follow-up with him in 1 week.        Assessment & Plan:

## 2014-11-17 NOTE — Patient Instructions (Signed)

## 2014-11-26 ENCOUNTER — Ambulatory Visit (INDEPENDENT_AMBULATORY_CARE_PROVIDER_SITE_OTHER): Payer: BC Managed Care – PPO | Admitting: Podiatry

## 2014-11-26 VITALS — BP 131/69 | HR 59 | Resp 16

## 2014-11-26 DIAGNOSIS — Z9889 Other specified postprocedural states: Secondary | ICD-10-CM

## 2014-11-26 DIAGNOSIS — E119 Type 2 diabetes mellitus without complications: Secondary | ICD-10-CM

## 2014-11-26 DIAGNOSIS — Q828 Other specified congenital malformations of skin: Secondary | ICD-10-CM

## 2014-11-26 NOTE — Progress Notes (Signed)
He presents today for follow-up of matrixectomy third digit of the right foot. He denies fever chills nausea vomiting muscle aches and pains that the toe is quite tender. He states that he is continuing to soak on a regular basis.  Objective: Vital signs are stable he is alert and oriented 3 no erythema edema cellulitis drainage or odor third digit right foot. Appears to be healing normally.  Assessment: Well-healing surgical toe third digit right foot mild tenderness status post matrixectomy.  Plan: Discontinue Betadine so with Epsom salts warm water soaks covering the daily living at night.

## 2015-02-18 ENCOUNTER — Other Ambulatory Visit: Payer: Self-pay | Admitting: Physical Medicine and Rehabilitation

## 2015-02-18 DIAGNOSIS — M545 Low back pain: Secondary | ICD-10-CM

## 2015-03-06 ENCOUNTER — Ambulatory Visit
Admission: RE | Admit: 2015-03-06 | Discharge: 2015-03-06 | Disposition: A | Discharge status: Home / Self Care | Payer: BC Managed Care – PPO | Source: Ambulatory Visit | Attending: Physical Medicine and Rehabilitation | Admitting: Physical Medicine and Rehabilitation

## 2015-03-06 DIAGNOSIS — M545 Low back pain: Secondary | ICD-10-CM

## 2015-08-19 ENCOUNTER — Encounter: Payer: Self-pay | Admitting: Internal Medicine

## 2015-12-17 ENCOUNTER — Encounter: Payer: Self-pay | Admitting: Gastroenterology

## 2016-03-30 ENCOUNTER — Encounter: Payer: Self-pay | Admitting: Podiatry

## 2016-03-30 ENCOUNTER — Ambulatory Visit (INDEPENDENT_AMBULATORY_CARE_PROVIDER_SITE_OTHER): Payer: BC Managed Care – PPO | Admitting: Podiatry

## 2016-03-30 VITALS — BP 177/88 | HR 68 | Resp 18

## 2016-03-30 DIAGNOSIS — M79673 Pain in unspecified foot: Secondary | ICD-10-CM

## 2016-03-30 DIAGNOSIS — G5762 Lesion of plantar nerve, left lower limb: Secondary | ICD-10-CM

## 2016-03-30 NOTE — Progress Notes (Signed)
He presents today with chief complaint of pain on the plantar aspect of his left foot. He states that radiates out into his toes he states this been present for the past few weeks.  Objective: Vital signs are stable alert and oriented 3. Pulses are palpable. Neurologic sensorium does demonstrate a palpable Mulder's click third interdigital space left foot.  Assessment: Neuroma third interspace left foot.  Plan: I injected the area today third interspace left foot with Kenalog and local anesthetic discussed appropriate shoe gear stretching exercises and ice therapy follow-up with him in 1 month.

## 2016-04-10 ENCOUNTER — Encounter (HOSPITAL_COMMUNITY): Payer: Self-pay | Admitting: Family Medicine

## 2016-04-10 ENCOUNTER — Observation Stay (HOSPITAL_COMMUNITY)
Admission: EM | Admit: 2016-04-10 | Discharge: 2016-04-11 | Disposition: A | Discharge status: Home / Self Care | Payer: BC Managed Care – PPO | Attending: Internal Medicine | Admitting: Internal Medicine

## 2016-04-10 ENCOUNTER — Observation Stay (HOSPITAL_COMMUNITY): Payer: BC Managed Care – PPO

## 2016-04-10 DIAGNOSIS — E785 Hyperlipidemia, unspecified: Secondary | ICD-10-CM | POA: Diagnosis not present

## 2016-04-10 DIAGNOSIS — R55 Syncope and collapse: Secondary | ICD-10-CM | POA: Diagnosis not present

## 2016-04-10 DIAGNOSIS — Z955 Presence of coronary angioplasty implant and graft: Secondary | ICD-10-CM | POA: Insufficient documentation

## 2016-04-10 DIAGNOSIS — Z7982 Long term (current) use of aspirin: Secondary | ICD-10-CM | POA: Diagnosis not present

## 2016-04-10 DIAGNOSIS — Z7984 Long term (current) use of oral hypoglycemic drugs: Secondary | ICD-10-CM | POA: Insufficient documentation

## 2016-04-10 DIAGNOSIS — Z87891 Personal history of nicotine dependence: Secondary | ICD-10-CM | POA: Insufficient documentation

## 2016-04-10 DIAGNOSIS — E119 Type 2 diabetes mellitus without complications: Secondary | ICD-10-CM | POA: Diagnosis not present

## 2016-04-10 DIAGNOSIS — I10 Essential (primary) hypertension: Secondary | ICD-10-CM | POA: Insufficient documentation

## 2016-04-10 DIAGNOSIS — K219 Gastro-esophageal reflux disease without esophagitis: Secondary | ICD-10-CM | POA: Insufficient documentation

## 2016-04-10 DIAGNOSIS — I2510 Atherosclerotic heart disease of native coronary artery without angina pectoris: Secondary | ICD-10-CM | POA: Insufficient documentation

## 2016-04-10 LAB — URINALYSIS, ROUTINE W REFLEX MICROSCOPIC
Bilirubin Urine: NEGATIVE
GLUCOSE, UA: NEGATIVE mg/dL
HGB URINE DIPSTICK: NEGATIVE
Ketones, ur: 15 mg/dL — AB
Leukocytes, UA: NEGATIVE
NITRITE: NEGATIVE
PROTEIN: NEGATIVE mg/dL
Specific Gravity, Urine: 1.024 (ref 1.005–1.030)
pH: 5.5 (ref 5.0–8.0)

## 2016-04-10 LAB — BASIC METABOLIC PANEL
Anion gap: 16 — ABNORMAL HIGH (ref 5–15)
BUN: 17 mg/dL (ref 6–20)
CALCIUM: 9.1 mg/dL (ref 8.9–10.3)
CO2: 24 mmol/L (ref 22–32)
CREATININE: 1.18 mg/dL (ref 0.61–1.24)
Chloride: 97 mmol/L — ABNORMAL LOW (ref 101–111)
GFR calc Af Amer: >60 mL/min (ref >60)
GLUCOSE: 219 mg/dL — AB (ref 65–99)
Potassium: 4 mmol/L (ref 3.5–5.1)
SODIUM: 137 mmol/L (ref 135–145)

## 2016-04-10 LAB — TROPONIN I
Troponin I: <0.03 ng/mL (ref <0.031)
Troponin I: <0.03 ng/mL (ref <0.031)

## 2016-04-10 LAB — CBC
HEMATOCRIT: 42.1 % (ref 39.0–52.0)
Hemoglobin: 14.2 g/dL (ref 13.0–17.0)
MCH: 29.7 pg (ref 26.0–34.0)
MCHC: 33.7 g/dL (ref 30.0–36.0)
MCV: 88.1 fL (ref 78.0–100.0)
PLATELETS: 153 10*3/uL (ref 150–400)
RBC: 4.78 MIL/uL (ref 4.22–5.81)
RDW: 12.7 % (ref 11.5–15.5)
WBC: 8.9 10*3/uL (ref 4.0–10.5)

## 2016-04-10 LAB — I-STAT TROPONIN, ED: Troponin i, poc: 0 ng/mL (ref 0.00–0.08)

## 2016-04-10 LAB — CBG MONITORING, ED
Glucose-Capillary: 190 mg/dL — ABNORMAL HIGH (ref 65–99)
Glucose-Capillary: 205 mg/dL — ABNORMAL HIGH (ref 65–99)

## 2016-04-10 LAB — GLUCOSE, CAPILLARY: GLUCOSE-CAPILLARY: 136 mg/dL — AB (ref 65–99)

## 2016-04-10 MED ORDER — SODIUM CHLORIDE 0.9 % IV SOLN
INTRAVENOUS | Status: DC
  Administered 2016-04-10: 20:00:00 via INTRAVENOUS

## 2016-04-10 MED ORDER — ONDANSETRON HCL 4 MG/2ML IJ SOLN: 4.0000 mg | Freq: Four times a day (QID) | INTRAMUSCULAR | Status: DC | PRN

## 2016-04-10 MED ORDER — SODIUM CHLORIDE 0.9% FLUSH
3.0000 mL | Freq: Two times a day (BID) | INTRAVENOUS | Status: DC
  Administered 2016-04-11: 3 mL via INTRAVENOUS

## 2016-04-10 MED ORDER — ONDANSETRON HCL 4 MG PO TABS: 4.0000 mg | ORAL_TABLET | Freq: Four times a day (QID) | ORAL | Status: DC | PRN

## 2016-04-10 MED ORDER — ACETAMINOPHEN 650 MG RE SUPP: 650.0000 mg | Freq: Four times a day (QID) | RECTAL | Status: DC | PRN

## 2016-04-10 MED ORDER — SENNOSIDES-DOCUSATE SODIUM 8.6-50 MG PO TABS
1.0000 | ORAL_TABLET | Freq: Every evening | ORAL | Status: DC | PRN
  Administered 2016-04-11: 1 via ORAL
  Filled 2016-04-10: qty 1

## 2016-04-10 MED ORDER — COLESEVELAM HCL 625 MG PO TABS
1250.0000 mg | ORAL_TABLET | Freq: Three times a day (TID) | ORAL | Status: DC
  Administered 2016-04-10 – 2016-04-11 (×2): 1250 mg via ORAL
  Filled 2016-04-10 (×2): qty 2

## 2016-04-10 MED ORDER — ACETAMINOPHEN 325 MG PO TABS: 650.0000 mg | ORAL_TABLET | Freq: Four times a day (QID) | ORAL | Status: DC | PRN

## 2016-04-10 MED ORDER — METOPROLOL SUCCINATE ER 25 MG PO TB24
25.0000 mg | ORAL_TABLET | Freq: Every day | ORAL | Status: DC
  Administered 2016-04-10 – 2016-04-11 (×2): 25 mg via ORAL
  Filled 2016-04-10 (×2): qty 1

## 2016-04-10 MED ORDER — EZETIMIBE 10 MG PO TABS
10.0000 mg | ORAL_TABLET | Freq: Every day | ORAL | Status: DC
  Administered 2016-04-11: 10 mg via ORAL
  Filled 2016-04-10: qty 1

## 2016-04-10 MED ORDER — PANTOPRAZOLE SODIUM 20 MG PO TBEC
20.0000 mg | DELAYED_RELEASE_TABLET | Freq: Every day | ORAL | Status: DC
  Administered 2016-04-10 – 2016-04-11 (×2): 20 mg via ORAL
  Filled 2016-04-10 (×2): qty 1

## 2016-04-10 MED ORDER — TRAZODONE HCL 50 MG PO TABS: 25.0000 mg | ORAL_TABLET | Freq: Every evening | ORAL | Status: DC | PRN

## 2016-04-10 MED ORDER — SODIUM CHLORIDE 0.9 % IV BOLUS (SEPSIS)
1000.0000 mL | Freq: Once | INTRAVENOUS | Status: AC
Start: 2016-04-10 — End: 2016-04-10
  Administered 2016-04-10: 1000 mL via INTRAVENOUS

## 2016-04-10 MED ORDER — INSULIN ASPART 100 UNIT/ML ~~LOC~~ SOLN
0.0000 [IU] | Freq: Three times a day (TID) | SUBCUTANEOUS | Status: DC
Start: 2016-04-10 — End: 2016-04-11
  Administered 2016-04-11 (×2): 1 [IU] via SUBCUTANEOUS

## 2016-04-10 NOTE — ED Provider Notes (Signed)
CSN: [id number]     Arrival date & time 04/10/16  1231 History   First MD Initiated Contact with Patient 04/10/16 1347     Chief Complaint  Patient presents with  . Loss of Consciousness     (Consider location/radiation/quality/duration/timing/severity/associated sxs/prior Treatment) HPI  70 year old male presents after a syncopal episode. 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 Patient states that he all of a sudden felt dizzy which lasted a few minutes and then he passed out briefly. Patient states he thinks he was out for only 1 or 2 seconds if at all. Witnesses state that he grazed his head on the door and lowered himself to the ground. No headaches or obvious head injury. Patient is not sure where he hit his head. He denies any palpitations or feeling like his heart skipped beats. No chest pain or shortness of breath. No weakness or numbness. No blurry vision. Patient is a diabetic but this feels different than whenever his sugar goes low. He was given candy and a Coke after he woke up his sugar was not checked. Newly recent medication change is he was put on oral acyclovir about one week ago for a blister on his lip. Has been taking this somewhat but does not like taking a because it causes of indigestion. Patient states that regularly he has skipping heartbeats and his typical pulse rate is in the 50s-60s. Feels normal now.  Past Medical History  Diagnosis Date  . Arthritis   . Diabetes mellitus   . Hypertension   . Hemorrhoids   . Esophageal stricture   . GERD (gastroesophageal reflux disease)   . Hiatal hernia   . Arthritis   . 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 Diverticulosis   . Hyperlipidemia   . Staph infection     Left ankle  . Hernia, inguinal, right   . CAD (coronary artery disease)     stents placed  . Dysrhythmia     "extra beats"  . Headache(784.0)   . Urinary hesitancy   . BPH (benign prostatic hypertrophy)    Past Surgical History   Procedure Laterality Date  . Tonsillectomy  1958  . Coronary angioplasty with stent placement  03/2005  . Knee surgery      Right  . Umbilical hernia repair  11/2006  . Colonoscopy    . Polypectomy    . Hernia repair  [id number]    umbilical hernia  . Coronary stent placement  03/10/2005    SUCCESSFUL STENTING OF THE MID AND CRUX OF THE RCA  . Cardiovascular stress test  01/22/2009    EF 51%, NO ISCHEMIA  . US echocardiography    . Cardiovascular stress test    . Ankle arthroplasty  2006  . Esophageal dilation      multiple procedures  . Inguinal hernia repair  11/28/2011    Procedure: HERNIA REPAIR INGUINAL ADULT;  Surgeon: Imogene Burn. Georgette Dover, MD;  Location: [facility];  Service: General;  Laterality: Right;  right inguinal hernia repair with mesh   Family History  Problem Relation Age of Onset  . Heart disease Mother   . Heart disease Sister   . Heart disease Brother   . Heart disease Maternal Grandfather   . Ovarian cancer Paternal Grandmother   . Cancer Paternal Grandmother     cervical  . Colon cancer Neg Hx   . Esophageal cancer Neg Hx   . Stomach cancer Neg Hx    Social History  Substance Use Topics  . Smoking status: Former Research scientist (life sciences)  . Smokeless tobacco: Never Used  . Alcohol Use: No    Review of Systems  Respiratory: Negative for shortness of breath.   Cardiovascular: Negative for chest pain.  Gastrointestinal: Negative for vomiting and abdominal pain.  Musculoskeletal: Negative for neck pain.  Neurological: Positive for dizziness and syncope. Negative for weakness, numbness and headaches.  All other systems reviewed and are negative.     Allergies  Bismuth subsalicylate; Codeine; Morphine and related; and Ramipril  Home Medications   Prior to Admission medications   Medication Sig Start Date End Date Taking? Authorizing Provider  Aspirin-Acetaminophen (GOODYS BODY PAIN PO) Take 1 packet by mouth daily as needed.    Yes Historical Provider, MD  calcium  gluconate 500 MG tablet Take 500 mg by mouth daily.     Yes Historical Provider, MD  cetirizine (ZYRTEC) 10 MG tablet Take 10 mg by mouth daily.     Yes Historical Provider, MD  Coenzyme Q10 (CO Q-10 PO) Take by mouth daily.     Yes Historical Provider, MD  fluticasone (FLONASE) 50 MCG/ACT nasal spray Place 2 sprays into the nose daily.     Yes Historical Provider, MD  lansoprazole (PREVACID) 15 MG capsule Take 15 mg by mouth daily.     Yes Historical Provider, MD  lisinopril (PRINIVIL,ZESTRIL) 20 MG tablet Take 20 mg by mouth daily.     Yes Historical Provider, MD  Magnesium (MAGNACAPS) 100 MG CAPS Take 1 capsule by mouth daily.     Yes Historical Provider, MD  metFORMIN (GLUCOPHAGE) 1000 MG tablet Take 1,000 mg by mouth 2 (two) times daily with a meal.     Yes Historical Provider, MD  metoprolol succinate (TOPROL-XL) 25 MG 24 hr tablet TAKE 1 AND 1/2 TABLETS EVERY DAY   Yes Peter M Martinique, MD  Tamsulosin HCl (FLOMAX) 0.4 MG CAPS Take 0.4 mg by mouth daily.     Yes Historical Provider, MD  triamcinolone cream (KENALOG) 0.1 % Apply 1 application topically 2 (two) times daily as needed. For itching   Yes Historical Provider, MD  vitamin C (ASCORBIC ACID) 500 MG tablet Take 500 mg by mouth daily.     Yes Historical Provider, MD  zinc gluconate 50 MG tablet Take 50 mg by mouth daily.     Yes Historical Provider, MD  cephALEXin (KEFLEX) 500 MG capsule Take 1 capsule (500 mg total) by mouth 3 (three) times daily. Patient not taking: Reported on 04/10/2016 11/17/14   Max T Hyatt, DPM  colesevelam (WELCHOL) 625 MG tablet Take 1,250 mg by mouth 3 (three) times daily.      Historical Provider, MD  ezetimibe (ZETIA) 10 MG tablet Take 10 mg by mouth daily.      Historical Provider, MD  glipiZIDE (GLUCOTROL) 5 MG tablet Take 5 mg by mouth 2 (two) times daily before a meal.      Historical Provider, MD  hydrocortisone (PROCTOZONE-HC) 2.5 % cream Apply 1 application topically 2 (two) times daily as needed.      Historical Provider, MD  neomycin-polymyxin-hydrocortisone (CORTISPORIN) otic solution APPLY TO AFFECTED TOE(S) TWICE DAILY AFTER SOAKING Patient not taking: Reported on 04/10/2016 11/17/14   Max T Hyatt, DPM   BP 134/61 mmHg  Pulse 60  Temp(Src) 98.3 F (36.8 C) (Oral)  Resp 18  SpO2 98% Physical Exam  Constitutional: He is oriented to person, place, and time. He appears well-developed and well-nourished.  HENT:  Head: Normocephalic  and atraumatic.  Right Ear: External ear normal.  Left Ear: External ear normal.  Nose: Nose normal.  Eyes: EOM are normal. Pupils are equal, round, and reactive to light. Right eye exhibits no discharge. Left eye exhibits no discharge.  Neck: Neck supple.  Cardiovascular: Normal rate, regular rhythm, normal heart sounds and intact distal pulses.   No murmur heard. Pulmonary/Chest: Effort normal and breath sounds normal.  Abdominal: Soft. He exhibits no distension. There is no tenderness.  Musculoskeletal: He exhibits no edema.  Neurological: He is alert and oriented to person, place, and time.  CN 2-12 grossly intact. 5/5 strength in all 4 extremities. Grossly normal sensation. Normal finger to nose  Skin: Skin is warm and dry. He is not diaphoretic.  Nursing note and vitals reviewed.   ED Course  Procedures (including critical care time) Labs Review Labs Reviewed  BASIC METABOLIC PANEL - Abnormal; Notable for the following:    Chloride 97 (*)    Glucose, Bld 219 (*)    Anion gap 16 (*)    All other components within normal limits  URINALYSIS, ROUTINE W REFLEX MICROSCOPIC (NOT AT [facility]) - Abnormal; Notable for the following:    Ketones, ur 15 (*)    All other components within normal limits  CBC  I-STAT TROPOININ, ED  CBG MONITORING, ED    Imaging Review No results found. I have personally reviewed and evaluated these images and lab results as part of my medical decision-making.   EKG Interpretation   Date/Time:  Sunday Apr 10 2016  12:43:23 EDT Ventricular Rate:  50 PR Interval:  154 QRS Duration: 82 QT Interval:  420 QTC Calculation: 382 R Axis:   -12 Text Interpretation:  Sinus rhythm with Blocked Premature atrial complexes  Inferior infarct , age undetermined Anterior infarct , age undetermined  Abnormal ECG Confirmed by Tomoko Sandra MD, Caprisha Bridgett [phone]) on 04/10/2016 1:18:33  PM      MDM   Final diagnoses:  Syncope, unspecified syncope type    Patient is hemodynamically stable. Chronically bradycardic, no signs of significant or pathologic bradycardia. Unclear why he passed out, but based on age and risk factors will admit for tele observation to hospitalist. He did not seem to hit his head hard and has no signs of trauma, neuro signs, or vomiting. No indication for head CT at this time.    Sherwood Gambler, MD 04/10/16 [id number]

## 2016-04-10 NOTE — ED Notes (Addendum)
Pt here for syncopal episode this am. sts he was at church and episode witnessed. sts bumped head on floor. No obvious hematomas. Took blood sugar at home after was 157. sts after soda and candy. Denies chest pain and SOB. Pt sts that he recently started taking acyclovir for a mouth sore and it made him feel funny.

## 2016-04-10 NOTE — H&P (Signed)
History and Physical    [name] [id number] DOB: 07-Jun-1946 DOA: 04/10/2016  Referring MD/NP/PA: EDP PCP: Marton Redwood, MD  Outpatient Specialists: Patient Care Team: Marton Redwood, MD as PCP - General (Internal Medicine)  Patient coming from:  Home   Chief Complaint: Syncope  HPI: [name] is a 70 y.o. 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 He reports sudden onset of dizziness, lasting a few seconds, and diaphoretic. He fell backwards hitting is head at the occipital area. He denies any palpitations or chest pain. He denies any shortness of breath. He did not take his diabetes med today, but his sugar was apparently low, his wife gave him a candy at home  At which time he was more alert. He reports increased allergy symptoms this season, in the setting ok a history of "inner ear problem". He reports similar episode about 20 years ago, but cannot recall what therapy he received. Denies lower extremity swelling Denies abdominal pain. Appetite is normal. Denies any dysuria. Denies abnormal skin rashes, or neuropathy. Denies any bleeding issues such as epistaxis, hematemesis, hematuria or hematochezia. Ambulating without difficulty. Wife reports he may have had some slurred speech after syncopal episode without recurrence. No new trips   ED Course:  BP 113/76 mmHg  Pulse 55  Temp(Src) 98.3 F (36.8 C) (Oral)  Resp 15  SpO2 95% Sinus rhythm with Blocked Premature atrial complexes Inferior infarct , age undetermined Anterior infarct . Tn 0.  CBC and CMET normal.  Review of Systems: As per HPI otherwise 10 point review of systems negative.   Past Medical History  Diagnosis Date  . Arthritis   . Diabetes mellitus   . Hypertension   . Hemorrhoids   . Esophageal stricture   . GERD (gastroesophageal reflux disease)   . Hiatal hernia    . Arthritis   . 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 Diverticulosis   . Hyperlipidemia   . Staph infection     Left ankle  . Hernia, inguinal, right   . CAD (coronary artery disease)     stents placed  . Dysrhythmia     "extra beats"  . Headache(784.0)   . Urinary hesitancy   . BPH (benign prostatic hypertrophy)     Past Surgical History  Procedure Laterality Date  . Tonsillectomy  1958  . Coronary angioplasty with stent placement  03/2005  . Knee surgery      Right  . Umbilical hernia repair  11/2006  . Colonoscopy    . Polypectomy    . Hernia repair  [id number]    umbilical hernia  . Coronary stent placement  03/10/2005    SUCCESSFUL STENTING OF THE MID AND CRUX OF THE RCA  . Cardiovascular stress test  01/22/2009    EF 51%, NO ISCHEMIA  . US echocardiography    . Cardiovascular stress test    . Ankle arthroplasty  2006  . Esophageal dilation      multiple procedures  . Inguinal hernia repair  11/28/2011    Procedure: HERNIA REPAIR INGUINAL ADULT;  Surgeon: Imogene Burn. Georgette Dover, MD;  Location: [facility];  Service: General;  Laterality: Right;  right inguinal hernia repair with mesh     reports that he has quit smoking. He has never used smokeless tobacco. He reports that he does not drink alcohol or use illicit  drugs.  Allergies  Allergen Reactions  . Bismuth Subsalicylate Nausea And Vomiting  . Codeine Nausea And Vomiting  . Morphine And Related Nausea And Vomiting  . Ramipril Rash    Family History  Problem Relation Age of Onset  . Heart disease Mother   . Heart disease Sister   . Heart disease Brother   . Heart disease Maternal Grandfather   . Ovarian cancer Paternal Grandmother   . Cancer Paternal Grandmother     cervical  . Colon cancer Neg Hx   . Esophageal cancer Neg Hx   . Stomach cancer Neg Hx     Family history reviewed and not pertinent (If you reviewed it)  Prior to Admission medications   Medication Sig Start Date End Date Taking?  Authorizing Provider  Aspirin-Acetaminophen (GOODYS BODY PAIN PO) Take 1 packet by mouth daily as needed.    Yes Historical Provider, MD  calcium gluconate 500 MG tablet Take 500 mg by mouth daily.     Yes Historical Provider, MD  cetirizine (ZYRTEC) 10 MG tablet Take 10 mg by mouth daily.     Yes Historical Provider, MD  Coenzyme Q10 (CO Q-10 PO) Take by mouth daily.     Yes Historical Provider, MD  fluticasone (FLONASE) 50 MCG/ACT nasal spray Place 2 sprays into the nose daily.     Yes Historical Provider, MD  lansoprazole (PREVACID) 15 MG capsule Take 15 mg by mouth daily.     Yes Historical Provider, MD  lisinopril (PRINIVIL,ZESTRIL) 20 MG tablet Take 20 mg by mouth daily.     Yes Historical Provider, MD  Magnesium (MAGNACAPS) 100 MG CAPS Take 1 capsule by mouth daily.     Yes Historical Provider, MD  metFORMIN (GLUCOPHAGE) 1000 MG tablet Take 1,000 mg by mouth 2 (two) times daily with a meal.     Yes Historical Provider, MD  metoprolol succinate (TOPROL-XL) 25 MG 24 hr tablet TAKE 1 AND 1/2 TABLETS EVERY DAY   Yes Tyler M Martinique, MD  Tamsulosin HCl (FLOMAX) 0.4 MG CAPS Take 0.4 mg by mouth daily.     Yes Historical Provider, MD  triamcinolone cream (KENALOG) 0.1 % Apply 1 application topically 2 (two) times daily as needed. For itching   Yes Historical Provider, MD  vitamin C (ASCORBIC ACID) 500 MG tablet Take 500 mg by mouth daily.     Yes Historical Provider, MD  zinc gluconate 50 MG tablet Take 50 mg by mouth daily.     Yes Historical Provider, MD  cephALEXin (KEFLEX) 500 MG capsule Take 1 capsule (500 mg total) by mouth 3 (three) times daily. Patient not taking: Reported on 04/10/2016 11/17/14   [name], DPM  colesevelam (WELCHOL) 625 MG tablet Take 1,250 mg by mouth 3 (three) times daily.      Historical Provider, MD  ezetimibe (ZETIA) 10 MG tablet Take 10 mg by mouth daily.      Historical Provider, MD  glipiZIDE (GLUCOTROL) 5 MG tablet Take 5 mg by mouth 2 (two) times daily before a  meal.      Historical Provider, MD  hydrocortisone (PROCTOZONE-HC) 2.5 % cream Apply 1 application topically 2 (two) times daily as needed.     Historical Provider, MD  neomycin-polymyxin-hydrocortisone (CORTISPORIN) otic solution APPLY TO AFFECTED TOE(S) TWICE DAILY AFTER SOAKING Patient not taking: Reported on 04/10/2016 11/17/14   Garrel Ridgel, DPM    Physical Exam:    Filed Vitals:   04/10/16 1246 04/10/16 1300 04/10/16 1400  04/10/16 1500  BP: 134/61 115/69 136/66 113/76  Pulse: 60 63 63 55  Temp: 98.3 F (36.8 C)     TempSrc: Oral     Resp: 18 18 15 15   SpO2: 98% 98% 100% 95%      Constitutional: NAD, calm, comfortable Filed Vitals:   04/10/16 1246 04/10/16 1300 04/10/16 1400 04/10/16 1500  BP: 134/61 115/69 136/66 113/76  Pulse: 60 63 63 55  Temp: 98.3 F (36.8 C)     TempSrc: Oral     Resp: 18 18 15 15   SpO2: 98% 98% 100% 95%   Eyes: PERRL, lids and conjunctivae normal ENMT: Mucous membranes are moist. Posterior pharynx clear of any exudate or lesions.Normal dentition.  Neck: normal, supple, no masses, no thyromegaly Respiratory: clear to auscultation bilaterally, no wheezing, no crackles. Normal respiratory effort. No accessory muscle use.  Cardiovascular: Regular rate and rhythm, no murmurs / rubs / gallops. No extremity edema. 2+ pedal pulses. No carotid bruits.  Abdomen: no tenderness, no masses palpated. No hepatosplenomegaly. Bowel sounds positive.  Musculoskeletal: no clubbing / cyanosis. No joint deformity upper and lower extremities. Good ROM, no contractures. Normal muscle tone.  Skin: no rashes, lower lip cold sore  ulcers. No induration Neurologic: CN 2-12 grossly intact. Sensation intact, DTR normal. Strength 5/5 in all 4.  Psychiatric: Normal judgment and insight. Alert and oriented x 3. Normal mood.     Labs on Admission: I have personally reviewed following labs and imaging studies  CBC:  Recent Labs Lab 04/10/16 1356  WBC 8.9  HGB 14.2  HCT  42.1  MCV 88.1  PLT 0000000    Basic Metabolic Panel:  Recent Labs Lab 04/10/16 1356  NA 137  K 4.0  CL 97*  CO2 24  GLUCOSE 219*  BUN 17  CREATININE 1.18  CALCIUM 9.1    GFR: CrCl cannot be calculated (Unknown ideal weight.).  Liver Function Tests: No results for input(s): AST, ALT, ALKPHOS, BILITOT, PROT, ALBUMIN in the last 168 hours. No results for input(s): LIPASE, AMYLASE in the last 168 hours. No results for input(s): AMMONIA in the last 168 hours.   Urine analysis:    Component Value Date/Time   COLORURINE YELLOW 04/10/2016 1350   APPEARANCEUR CLEAR 04/10/2016 1350   LABSPEC 1.024 04/10/2016 1350   PHURINE 5.5 04/10/2016 1350   GLUCOSEU NEGATIVE 04/10/2016 1350   HGBUR NEGATIVE 04/10/2016 1350   BILIRUBINUR NEGATIVE 04/10/2016 1350   KETONESUR 15* 04/10/2016 1350   PROTEINUR NEGATIVE 04/10/2016 1350   UROBILINOGEN 0.2 12/17/2007 0914   NITRITE NEGATIVE 04/10/2016 1350   LEUKOCYTESUR NEGATIVE 04/10/2016 1350    Sepsis Labs: @LABRCNTIP (procalcitonin:4,lacticidven:4) )No results found for this or any previous visit (from the past 240 hour(s)).   Radiological Exams on Admission: No results found.   Assessment/Plan Active Problems:   Syncope   Syncope, unknown  etiology in a patient with a history of DM,  and bradycardia, inner ear disease ?vasovagal event. Labs, EKG unrevealing. No further syncopal episodes. No seizures.  BP 113/76 mmHg  Pulse 55  Temp(Src) 98.3 F (36.8 C) (Oral)  Resp 15  SpO2 95% glucose 219 after taking some sugar at home prior to arrival  -admit for observation - Tele bed.   -Noncontrast CT scan of head  bilateral Carotid ultrasound  CXR   2 D echo     Hypertension BP 113/76 mmHg  Pulse 55  Temp(Src) 98.3 F (36.8 C) (Oral)  Resp 15  SpO2 95% Hold home anti-hypertensive medications  if BP less than 100 and P less than 55 .   Type II Diabetes Home regimen includes Current blood sugar level is  219 Hgb A1C Hold home  oral diabetic medications.  SSI Heart healthy carb modified diet.  Hyperlipidemia Continue home statins    DVT prophylaxis:SCD's  Code Status:   Full    Family Communication:  Discussed with wife  Disposition Plan: Expect patient to be discharged to home Consults called:    None Admission status: I Obs Tele    Shalandria Elsbernd E, PA-C [facility]   If 7PM-7AM, please contact night-coverage [email or web address] Password [facility]  04/10/2016, 3:45 PM

## 2016-04-10 NOTE — Progress Notes (Signed)
Received report on patient from [facility]

## 2016-04-11 ENCOUNTER — Observation Stay (HOSPITAL_BASED_OUTPATIENT_CLINIC_OR_DEPARTMENT_OTHER): Payer: BC Managed Care – PPO

## 2016-04-11 ENCOUNTER — Other Ambulatory Visit: Payer: Self-pay | Admitting: Student

## 2016-04-11 DIAGNOSIS — R55 Syncope and collapse: Secondary | ICD-10-CM

## 2016-04-11 DIAGNOSIS — K219 Gastro-esophageal reflux disease without esophagitis: Secondary | ICD-10-CM

## 2016-04-11 DIAGNOSIS — E119 Type 2 diabetes mellitus without complications: Secondary | ICD-10-CM

## 2016-04-11 DIAGNOSIS — I10 Essential (primary) hypertension: Secondary | ICD-10-CM | POA: Diagnosis not present

## 2016-04-11 LAB — ECHOCARDIOGRAM COMPLETE: WEIGHTICAEL: 2520 [oz_av]

## 2016-04-11 LAB — GLUCOSE, CAPILLARY
GLUCOSE-CAPILLARY: 149 mg/dL — AB (ref 65–99)
GLUCOSE-CAPILLARY: 135 mg/dL — AB (ref 65–99)
GLUCOSE-CAPILLARY: 127 mg/dL — AB (ref 65–99)

## 2016-04-11 LAB — HEMOGLOBIN A1C
Hgb A1c MFr Bld: 7.3 % — ABNORMAL HIGH (ref 4.8–5.6)
MEAN PLASMA GLUCOSE: 163 mg/dL

## 2016-04-11 LAB — TROPONIN I: Troponin I: <0.03 ng/mL (ref <0.031)

## 2016-04-11 MED ORDER — TAMSULOSIN HCL 0.4 MG PO CAPS: 0.4000 mg | ORAL_CAPSULE | Freq: Every day | ORAL | Status: DC

## 2016-04-11 MED ORDER — MAGNESIUM 100 MG PO CAPS: 1.0000 | ORAL_CAPSULE | Freq: Every day | ORAL | Status: DC

## 2016-04-11 MED ORDER — FLUTICASONE PROPIONATE 50 MCG/ACT NA SUSP
2.0000 | Freq: Every day | NASAL | Status: DC
  Administered 2016-04-11: 2 via NASAL
  Filled 2016-04-11: qty 16

## 2016-04-11 MED ORDER — CALCIUM CARBONATE 1250 (500 CA) MG PO TABS: 1.0000 | ORAL_TABLET | Freq: Every day | ORAL | Status: DC

## 2016-04-11 MED ORDER — LORATADINE 10 MG PO TABS
10.0000 mg | ORAL_TABLET | Freq: Every day | ORAL | Status: DC
  Administered 2016-04-11: 10 mg via ORAL
  Filled 2016-04-11: qty 1

## 2016-04-11 MED ORDER — CALCIUM GLUCONATE 500 MG PO TABS
500.0000 mg | ORAL_TABLET | Freq: Every day | ORAL | Status: DC
  Filled 2016-04-11 (×2): qty 1

## 2016-04-11 MED ORDER — ZINC GLUCONATE 50 MG PO TABS: 50.0000 mg | ORAL_TABLET | Freq: Every day | ORAL | Status: DC

## 2016-04-11 NOTE — Progress Notes (Signed)
*  PRELIMINARY RESULTS* Echocardiogram 2D Echocardiogram has been performed.  Leavy Cella 04/11/2016, 9:46 AM

## 2016-04-11 NOTE — Progress Notes (Signed)
Discharge instructions given. Pt verbalized understanding and all questions were answered.  

## 2016-04-11 NOTE — Progress Notes (Signed)
VASCULAR LAB PRELIMINARY  PRELIMINARY  PRELIMINARY  PRELIMINARY  Carotid duplex completed.    Preliminary report:  1-39% ICA plaquing.  Vertebral artery flow is antegrade.   Susana Duell, RVT 04/11/2016, 9:23 AM

## 2016-04-11 NOTE — Progress Notes (Signed)
Inpatient Diabetes Program Recommendations  AACE/ADA: New Consensus Statement on Inpatient Glycemic Control (2015)  Target Ranges:  Prepandial:   less than 140 mg/dL      Peak postprandial:   less than 180 mg/dL (1-2 hours)      Critically ill patients:  140 - 180 mg/dL   Review of Glycemic Control  Results for PRANIT, OWENSBY (MRN [id number]) as of 04/11/2016 16:25  Ref. Range 04/10/2016 13:51 04/10/2016 18:42 04/10/2016 20:36 04/11/2016 06:19 04/11/2016 11:11  Glucose-Capillary Latest Ref Range: 65-99 mg/dL 190 (H) 205 (H) 136 (H) 149 (H) 135 (H)   This coordinator met with patient to discuss possibility of hypoglycemia the morning of his event.  Pt states he did not take his Glipizide that morning because he did not eat.  He states he never eats on Sunday mornings and waits until he eats lunch to take his Glipizide.  He did not check his blood sugar that morning but he and his wife state "that was not it" (hypoglycemia).   No questions/concerns at the end of our visit.   Thank you  Raoul Pitch BSN, RN,CDE Inpatient Diabetes Coordinator [phone] (team pager)

## 2016-04-11 NOTE — Discharge Summary (Signed)
Physician Discharge Summary  [name] [id number] DOB: 08/01/1946 DOA: 04/10/2016  PCP: Marton Redwood, MD  Admit date: 04/10/2016 Discharge date: 04/11/2016  Time spent: 65 minutes  Recommendations for Outpatient Follow-up:  1. Follow-up with Marton Redwood, MD in 1-2 weeks. 2. Patient will be set up to pick up a 30 day event monitor at his cardiologist office on 04/14/2016. 3. Patient is to follow-up with his cardiologist, Dr. Peter Martinique on 08/25/2016 at 10:15 AM.   Discharge Diagnoses:  Principal Problem:   Pre-syncope Active Problems:   Essential hypertension   GERD   Hyperlipidemia   Diabetes ([place])   Discharge Condition: Stable and improved  Diet recommendation: carb modified  Filed Weights   04/11/16 0402  Weight: 71.442 kg (157 lb 8 oz)    History of present illness:  Per Dr Osie Bond is a 70 y.o. 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 He reported sudden onset of dizziness, lasting a few seconds, and diaphoretic. He fell backwards hitting is head at the occipital area. He denied any palpitations or chest pain. He denied any shortness of breath. He did not take his diabetes med today, but his sugar was apparently low, his wife gave him a candy at home At which time he was more alert. He reports increased allergy symptoms this season, in the setting ok a history of "inner ear problem". He reported similar episode about 20 years ago, but cannot recall what therapy he received. Denied lower extremity swelling Denied abdominal pain. Appetite is normal. Denied any dysuria. Denied abnormal skin rashes, or neuropathy. Denied any bleeding issues such as epistaxis, hematemesis, hematuria or hematochezia. Ambulating without difficulty. Wife reported he may have had some slurred speech after syncopal episode without  recurrence. 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 Patient however had told the admitting physician a was a syncopal episode however in discussion with the patient patient denied ever totally passing out. Patient did state that he felt sudden onset dizziness and went to the ground however did not lose any consciousness. Patient was admitted for further evaluation. Cardiac enzymes were cycled which were negative 3. CT head which was obtained was negative for any acute abnormalities. Patient did not have any focal neurological deficits. 2-D echo was obtained with an EF of 50-55% with no wall motion abnormalities. No significant valvular abnormalities. Patient improved clinically did not have any further episodes during the hospitalization. Patient did not have any signs of infection. Patient be discharged home and will be set up for a 30 day event monitor for further evaluation and will follow-up with his cardiologist as an outpatient. Patient will also follow-up with PCP in the outpatient setting. Patient be discharged home in stable and improved condition.  #2 hypertension Remained stable during the hospitalization. Patient's antihypertensive medications were held and will be resumed on discharge.  #3 type 2 diabetes Patient's oral hypoglycemic agents were held. Patient was maintained on a sliding scale insulin during the hospitalization. Outpatient follow-up.  Procedures:  2-D echo 04/11/2016  Carotid Dopplers 04/11/2016  CT head 04/10/2016  Chest x-ray 04/10/2016  Consultations:  None  Discharge Exam: Filed Vitals:   04/11/16 1003 04/11/16 1409  BP: 142/78 134/58  Pulse: 57 56  Temp:    Resp:  20  General: NAD Cardiovascular: RRR Respiratory: CTAB  Discharge Instructions   Discharge Instructions    Diet - low sodium heart healthy    Complete by:  As directed      Discharge  instructions    Complete by:  As directed   Follow up with cardiologist, as scheduled. Follow up with Marton Redwood, MD in 1-2 weeks.     Increase activity slowly    Complete by:  As directed           Current Discharge Medication List    CONTINUE these medications which have NOT CHANGED   Details  Aspirin-Acetaminophen (GOODYS BODY PAIN PO) Take 1 packet by mouth daily as needed.     calcium gluconate 500 MG tablet Take 500 mg by mouth daily.      cetirizine (ZYRTEC) 10 MG tablet Take 10 mg by mouth daily.      Coenzyme Q10 (CO Q-10 PO) Take by mouth daily.      fluticasone (FLONASE) 50 MCG/ACT nasal spray Place 2 sprays into the nose daily.      lansoprazole (PREVACID) 15 MG capsule Take 15 mg by mouth daily.      lisinopril (PRINIVIL,ZESTRIL) 20 MG tablet Take 20 mg by mouth daily.      Magnesium (MAGNACAPS) 100 MG CAPS Take 1 capsule by mouth daily.      metFORMIN (GLUCOPHAGE) 1000 MG tablet Take 1,000 mg by mouth 2 (two) times daily with a meal.      metoprolol succinate (TOPROL-XL) 25 MG 24 hr tablet TAKE 1 AND 1/2 TABLETS EVERY DAY Qty: 45 tablet, Refills: 0    Tamsulosin HCl (FLOMAX) 0.4 MG CAPS Take 0.4 mg by mouth daily.      triamcinolone cream (KENALOG) 0.1 % Apply 1 application topically 2 (two) times daily as needed. For itching    vitamin C (ASCORBIC ACID) 500 MG tablet Take 500 mg by mouth daily.      zinc gluconate 50 MG tablet Take 50 mg by mouth daily.      colesevelam (WELCHOL) 625 MG tablet Take 1,250 mg by mouth 3 (three) times daily.      ezetimibe (ZETIA) 10 MG tablet Take 10 mg by mouth daily.      glipiZIDE (GLUCOTROL) 5 MG tablet Take 5 mg by mouth 2 (two) times daily before a meal.      hydrocortisone (PROCTOZONE-HC) 2.5 % cream Apply 1 application topically 2 (two) times daily as needed.       STOP taking these medications     cephALEXin (KEFLEX) 500 MG capsule      neomycin-polymyxin-hydrocortisone (CORTISPORIN) otic solution         Allergies  Allergen Reactions  . Bismuth Subsalicylate Nausea And Vomiting  . Codeine Nausea And Vomiting  . Morphine And Related Nausea And Vomiting  . Ramipril Rash   Follow-up Information    Follow up with [facility] On 04/14/2016.   Specialty:  Cardiology   Why:  Appointment to pick up Event Monitor 04/14/2016 at 10:00AM. ([place], [place] FROM [place]!!!)   Contact information:   [address] [place] [phone]      Follow up with Peter Martinique, MD On 08/25/2016.   Specialty:  Cardiology   Why:  Cardiology Appointment with Dr. Martinique on 08/25/2016 at 10:15AM.  ([place]). Please call the office if needing to be seen sooner. If  event monitor shows arrhythmias, we will arrange closer follow-up.   Contact information:   [place] [address] [phone]       Follow up with Marton Redwood, MD. Schedule an appointment as soon as possible for a visit in 1 week.   Specialty:  Internal Medicine   Why:  f/u 1-2 weeks   Contact information:   [address] [place] [address] [phone]        The results of significant diagnostics from this hospitalization (including imaging, microbiology, ancillary and laboratory) are listed below for reference.    Significant Diagnostic Studies: X-ray Chest Pa And Lateral  04/10/2016  CLINICAL DATA:  Syncope EXAM: CHEST  2 VIEW COMPARISON:  11/21/2011 FINDINGS: Stable mild cardiac enlargement. Negative aortic and hilar contours. Calcified granuloma over the left lung. There is no edema, consolidation, effusion, or pneumothorax. IMPRESSION: Stable.  No evidence of acute disease. Electronically Signed   By: Monte Fantasia M.D.   On: 04/10/2016 17:57   Ct Head Wo Contrast  04/10/2016  CLINICAL DATA:  70 year old diabetic hypertensive male with syncope and collapse history feeling  dizzy. Initial encounter. EXAM: CT HEAD WITHOUT CONTRAST TECHNIQUE: Contiguous axial images were obtained from the base of the skull through the vertex without intravenous contrast. COMPARISON:  None. FINDINGS: No skull fracture or intracranial hemorrhage. Mild chronic microvascular changes without CT evidence of large acute infarct. Global mild atrophy without hydrocephalus. No intracranial mass lesion noted on this unenhanced exam. Vascular calcifications. Orbital structures unremarkable. Mastoid air cells, middle ear cavities and visualized paranasal sinuses are clear. IMPRESSION: No skull fracture or intracranial hemorrhage. Mild chronic microvascular changes without CT evidence of large acute infarct. Global mild atrophy. Electronically Signed   By: Genia Del M.D.   On: 04/10/2016 18:13    Microbiology: No results found for this or any previous visit (from the past 240 hour(s)).   Labs: Basic Metabolic Panel:  Recent Labs Lab 04/10/16 1356  NA 137  K 4.0  CL 97*  CO2 24  GLUCOSE 219*  BUN 17  CREATININE 1.18  CALCIUM 9.1   Liver Function Tests: No results for input(s): AST, ALT, ALKPHOS, BILITOT, PROT, ALBUMIN in the last 168 hours. No results for input(s): LIPASE, AMYLASE in the last 168 hours. No results for input(s): AMMONIA in the last 168 hours. CBC:  Recent Labs Lab 04/10/16 1356  WBC 8.9  HGB 14.2  HCT 42.1  MCV 88.1  PLT 153   Cardiac Enzymes:  Recent Labs Lab 04/10/16 1801 04/10/16 2200 04/11/16 0439  TROPONINI <0.03 <0.03 <0.03   BNP: BNP (last 3 results) No results for input(s): BNP in the last 8760 hours.  ProBNP (last 3 results) No results for input(s): PROBNP in the last 8760 hours.  CBG:  Recent Labs Lab 04/10/16 1351 04/10/16 1842 04/10/16 2036 04/11/16 0619 04/11/16 1111  GLUCAP 190* 205* 136* 149* 135*       Signed:  THOMPSON,DANIEL MD.  [facility] 04/11/2016, 4:35 PM

## 2016-04-14 ENCOUNTER — Ambulatory Visit (INDEPENDENT_AMBULATORY_CARE_PROVIDER_SITE_OTHER): Payer: BC Managed Care – PPO

## 2016-04-14 ENCOUNTER — Telehealth: Payer: Self-pay | Admitting: *Deleted

## 2016-04-14 DIAGNOSIS — R55 Syncope and collapse: Secondary | ICD-10-CM | POA: Diagnosis not present

## 2016-04-14 NOTE — Telephone Encounter (Signed)
Returned call back to pt. He denies feeling CP, SOB, palpitations, N/V, sweating at this time or at the time of the monitor strip. Appt scheduled for pt next week on 05/20/16 with Truitt Merle. Pt verbalized understanding to go to the emergency room or call 911 if he develops on going palpitations, CP, SOB, nausea, vomiting, or sweating or any other emergent problems. Pt to call us if he has any questions or concerns.

## 2016-04-14 NOTE — Telephone Encounter (Signed)
Follow Up ° °Pt returned call//  °

## 2016-04-14 NOTE — Telephone Encounter (Signed)
Received [employer] strip from medical records personnel.   Attempted to call pt. No answer. Left message to call back.   Strip taken to Dr Claiborne Billings, DOD. He advised for pt to be seen sometime in the next week with Dr Martinique or APP.

## 2016-04-18 ENCOUNTER — Telehealth: Payer: Self-pay | Admitting: Cardiology

## 2016-04-19 ENCOUNTER — Encounter: Payer: BC Managed Care – PPO | Admitting: Nurse Practitioner

## 2016-04-20 NOTE — Telephone Encounter (Signed)
Close encounter 

## 2016-04-21 ENCOUNTER — Ambulatory Visit (INDEPENDENT_AMBULATORY_CARE_PROVIDER_SITE_OTHER): Payer: BC Managed Care – PPO | Admitting: Cardiology

## 2016-04-21 ENCOUNTER — Encounter: Payer: Self-pay | Admitting: Cardiology

## 2016-04-21 VITALS — BP 138/76 | HR 52 | Ht 69.0 in | Wt 156.0 lb

## 2016-04-21 DIAGNOSIS — I480 Paroxysmal atrial fibrillation: Secondary | ICD-10-CM | POA: Diagnosis not present

## 2016-04-21 DIAGNOSIS — E785 Hyperlipidemia, unspecified: Secondary | ICD-10-CM | POA: Diagnosis not present

## 2016-04-21 DIAGNOSIS — I2510 Atherosclerotic heart disease of native coronary artery without angina pectoris: Secondary | ICD-10-CM

## 2016-04-21 DIAGNOSIS — I10 Essential (primary) hypertension: Secondary | ICD-10-CM | POA: Diagnosis not present

## 2016-04-21 DIAGNOSIS — E119 Type 2 diabetes mellitus without complications: Secondary | ICD-10-CM | POA: Diagnosis not present

## 2016-04-21 DIAGNOSIS — R55 Syncope and collapse: Secondary | ICD-10-CM

## 2016-04-21 DIAGNOSIS — I4891 Unspecified atrial fibrillation: Secondary | ICD-10-CM | POA: Insufficient documentation

## 2016-04-21 MED ORDER — RIVAROXABAN 20 MG PO TABS: 20.0000 mg | ORAL_TABLET | Freq: Every day | ORAL | Status: DC

## 2016-04-21 NOTE — Patient Instructions (Addendum)
Start Xarelto 20 mg once a day to reduce your risk of stroke.  Avoid nonsteroidal anti-inflammatory drugs like Ibuprofen.  I will follow up in 3 months

## 2016-04-21 NOTE — Progress Notes (Signed)
[name] Date of Birth: Dec 13, 1945 Medical Record [id number]  History of Present Illness: [name] is seen for post hospital follow up. He was last seen by me in 2012.  He is status post stenting of the right coronary in 2006 with a Cypher stent. His last nuclear stress test in February 2010 showed no ischemia. Ejection fraction was 51%.  Recently he was admitted with a near syncopal episode at church. He states he became very lightheaded and had to sit then lie down on the floor. He was responsive throughout and never lost consciousness. He was admitted. Ecg showed PACs and blocker PACs. Echo and carotid dopplers were unremarkable. CT of the head showed chronic white matter changes. He was DC home and an event monitor was placed. On 04/14/16 this demonstrated AFib with rate in the 70s. The patient was asymptomatic with this. He denies any further lightheadedness.   Current Outpatient Prescriptions on File Prior to Visit  Medication Sig Dispense Refill  . calcium gluconate 500 MG tablet Take 500 mg by mouth daily.      . cetirizine (ZYRTEC) 10 MG tablet Take 10 mg by mouth daily.      . Coenzyme Q10 (CO Q-10 PO) Take by mouth daily.      . fluticasone (FLONASE) 50 MCG/ACT nasal spray Place 2 sprays into the nose daily.      Marland Kitchen glipiZIDE (GLUCOTROL) 5 MG tablet Take 5 mg by mouth 2 (two) times daily before a meal.      . hydrocortisone (PROCTOZONE-HC) 2.5 % cream Apply 1 application topically 2 (two) times daily as needed.     . lansoprazole (PREVACID) 15 MG capsule Take 15 mg by mouth daily.      Marland Kitchen lisinopril (PRINIVIL,ZESTRIL) 20 MG tablet Take 20 mg by mouth daily.      . Magnesium (MAGNACAPS) 100 MG CAPS Take 1 capsule by mouth daily.      . metFORMIN (GLUCOPHAGE) 1000 MG tablet Take 1,000 mg by mouth 2 (two) times daily with a meal.      . metoprolol succinate (TOPROL-XL) 25 MG 24 hr tablet TAKE 1 AND 1/2 TABLETS EVERY DAY (Patient taking differently: TAKE 1/2 tablet in the morning  and at night.) 45 tablet 0  . Tamsulosin HCl (FLOMAX) 0.4 MG CAPS Take 0.4 mg by mouth daily.      Marland Kitchen triamcinolone cream (KENALOG) 0.1 % Apply 1 application topically 2 (two) times daily as needed. For itching    . vitamin C (ASCORBIC ACID) 500 MG tablet Take 500 mg by mouth daily.      Marland Kitchen zinc gluconate 50 MG tablet Take 50 mg by mouth daily.       No current facility-administered medications on file prior to visit.    Allergies  Allergen Reactions  . Bismuth Subsalicylate Nausea And Vomiting  . Codeine Nausea And Vomiting  . Morphine And Related Nausea And Vomiting  . Ramipril Rash    Past Medical History  Diagnosis Date  . Arthritis   . Diabetes mellitus   . Hypertension   . Hemorrhoids   . Esophageal stricture   . GERD (gastroesophageal reflux disease)   . Hiatal hernia   . Arthritis   . 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 Diverticulosis   . Hyperlipidemia   . Staph infection     Left ankle  . Hernia, inguinal, right   . CAD (coronary artery disease)     stents placed  . Dysrhythmia     "  extra beats"  . Headache(784.0)   . Urinary hesitancy   . BPH (benign prostatic hypertrophy)   . Atrial fibrillation [facility])     Past Surgical History  Procedure Laterality Date  . Tonsillectomy  1958  . Coronary angioplasty with stent placement  03/2005  . Knee surgery      Right  . Umbilical hernia repair  11/2006  . Colonoscopy    . Polypectomy    . Hernia repair  [id number]    umbilical hernia  . Coronary stent placement  03/10/2005    SUCCESSFUL STENTING OF THE MID AND CRUX OF THE RCA  . Cardiovascular stress test  01/22/2009    EF 51%, NO ISCHEMIA  . US echocardiography    . Cardiovascular stress test    . Ankle arthroplasty  2006  . Esophageal dilation      multiple procedures  . Inguinal hernia repair  11/28/2011    Procedure: HERNIA REPAIR INGUINAL ADULT;  Surgeon: Imogene Burn. Georgette Dover, MD;  Location: [facility] OR;  Service: General;  Laterality: Right;  right inguinal  hernia repair with mesh    History  Smoking status  . Former Smoker  Smokeless tobacco  . Never Used    History  Alcohol Use No    Family History  Problem Relation Age of Onset  . Heart disease Mother   . Heart disease Sister   . Heart disease Brother   . Heart disease Maternal Grandfather   . Ovarian cancer Paternal Grandmother   . Cancer Paternal Grandmother     cervical  . Colon cancer Neg Hx   . Esophageal cancer Neg Hx   . Stomach cancer Neg Hx     Review of Systems: The review of systems is positive for chronic back pain for which he gets periodic injection.  All other systems were reviewed and are negative.  Physical Exam: BP 138/76 mmHg  Pulse 52  Ht 5\' 9"  (1.753 m)  Wt 70.761 kg (156 lb)  BMI 23.03 kg/m2 The patient is alert and oriented x 3.  The mood and affect are normal.  The skin is warm and dry.  Color is normal.  The HEENT exam reveals that the sclera are nonicteric.  The mucous membranes are moist.  The carotids are 2+ without bruits.  There is no thyromegaly.  There is no JVD.  The lungs are clear.  The chest wall is non tender.  The heart exam reveals a regular rate with a normal S1 and S2.  There are no murmurs, gallops, or rubs.  The PMI is not displaced.   Abdominal exam reveals good bowel sounds.  There is no guarding or rebound.  There is no hepatosplenomegaly or tenderness.  There are no masses.  Exam of the legs reveal no clubbing, cyanosis, or edema.  The legs are without rashes.  The distal pulses are intact.  Cranial nerves II - XII are intact.  Motor and sensory functions are intact.  The gait is normal.  LABORATORY DATA: ECG today demonstrates normal sinus rhythm with PACs. Rate 52. Q waves in leads 3 and aVF. 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 Poor R wave progression. I have personally reviewed and interpreted this study.  Event monitor tracing from 04/14/16 reviewed and shows Afib with controlled rate.   Lab Results   Component Value Date   WBC 8.9 04/10/2016   HGB 14.2 04/10/2016   HCT 42.1 04/10/2016   PLT 153 04/10/2016   GLUCOSE 219*  04/10/2016   ALT 18 12/17/2007   AST 19 12/17/2007   NA 137 04/10/2016   K 4.0 04/10/2016   CL 97* 04/10/2016   CREATININE 1.18 04/10/2016   BUN 17 04/10/2016   CO2 24 04/10/2016   HGBA1C 7.3* 04/10/2016   Echo: 04/11/16:Study Conclusions  - Left ventricle: The cavity size was normal. Wall thickness was  increased in a pattern of mild LVH. Systolic function was normal.  The estimated ejection fraction was in the range of 50% to 55%.  LV apical false tendon. Wall motion was normal; there were no  regional wall motion abnormalities. Left ventricular diastolic  function parameters were normal. - Left atrium: The atrium was mildly dilated. - Right atrium: The atrium was mildly dilated. - Atrial septum: Poorly visualized. - Systemic veins: The IVC measures <2.1 cm, but does not collapse  >50%, suggesting an elevated RA Pressure of 8 mmHg.  Assessment / Plan: 1. Paroxysmal Afib. Asymptomatic at time arrhythmia recorded. Rate controlled on metoprolol. [place] vasc score of 3. Recommend long term anticoagulation to reduce risk of CVA. Will start Xarelto 20 mg daily. He should avoid ASA and NSAIDs.   2. Near syncope. ? Related to arrhythmia. Will continue event monitor for full 30 days to rule out bradycardia or pauses.   3. CAD with remote stent RCA. Asymptomatic.   4. DM type 2  5. HTN controlled.

## 2016-04-27 ENCOUNTER — Ambulatory Visit (INDEPENDENT_AMBULATORY_CARE_PROVIDER_SITE_OTHER): Payer: BC Managed Care – PPO | Admitting: Podiatry

## 2016-04-27 ENCOUNTER — Encounter: Payer: Self-pay | Admitting: Podiatry

## 2016-04-27 VITALS — BP 131/71 | HR 69 | Resp 18

## 2016-04-27 DIAGNOSIS — G5762 Lesion of plantar nerve, left lower limb: Secondary | ICD-10-CM

## 2016-04-27 NOTE — Progress Notes (Signed)
She presents today for follow-up of neuroma left foot. States the injection we provided last time did help some.  Objective: Vital signs stable alert and oriented 3. Pulses are palpable. Palpable click third interdigital space left foot.  Assessment: Neuroma thirdleft.  Plan: Injected dehydrated alcohol to the area today. Follow-up with her in 3 weeks.

## 2016-05-16 ENCOUNTER — Ambulatory Visit (INDEPENDENT_AMBULATORY_CARE_PROVIDER_SITE_OTHER): Payer: BC Managed Care – PPO | Admitting: Podiatry

## 2016-05-16 DIAGNOSIS — G5762 Lesion of plantar nerve, left lower limb: Secondary | ICD-10-CM

## 2016-05-16 DIAGNOSIS — E119 Type 2 diabetes mellitus without complications: Secondary | ICD-10-CM

## 2016-05-16 NOTE — Progress Notes (Signed)
He presents today for follow-up of his neuroma third digital space left foot. He states it is doing some better.  Objective: Vital signs are stable he is alert and oriented 3. Pulses are palpable. Palpable Mulder's click to the third interdigital space of the left foot.  Assessment: Neuroma third interspace left foot.  Plan: I injected his second dose of dehydrated alcohol today will follow up with him in 3 weeks for his third dose.

## 2016-06-01 ENCOUNTER — Other Ambulatory Visit: Payer: Self-pay | Admitting: Physician Assistant

## 2016-06-08 ENCOUNTER — Ambulatory Visit (INDEPENDENT_AMBULATORY_CARE_PROVIDER_SITE_OTHER): Payer: BC Managed Care – PPO | Admitting: Podiatry

## 2016-06-08 ENCOUNTER — Encounter: Payer: Self-pay | Admitting: Podiatry

## 2016-06-08 DIAGNOSIS — G5762 Lesion of plantar nerve, left lower limb: Secondary | ICD-10-CM | POA: Diagnosis not present

## 2016-06-08 NOTE — Progress Notes (Signed)
He presents today for follow-up of his neuroma third interdigital space of his left foot he states that it may be doing some better but is not well yet is still painful to step on it the wrong way. He states that the right foot is becoming painful as well.  Objective: Vital signs are stable alert and oriented 3. Pulses are palpable. Palpable Mulder's click to the third interdigital space of the left foot.  Assessment: Resolving neuroma third interdigital space left foot.  Plan: At this point I reinjected his left third interdigital space with his third dose of dehydrated alcohol and we will check the right foot next visit if it is still symptomatic.

## 2016-06-29 ENCOUNTER — Ambulatory Visit: Payer: BC Managed Care – PPO | Admitting: Podiatry

## 2016-07-13 ENCOUNTER — Ambulatory Visit (INDEPENDENT_AMBULATORY_CARE_PROVIDER_SITE_OTHER): Payer: BC Managed Care – PPO | Admitting: Podiatry

## 2016-07-13 ENCOUNTER — Encounter: Payer: Self-pay | Admitting: Podiatry

## 2016-07-13 DIAGNOSIS — G5762 Lesion of plantar nerve, left lower limb: Secondary | ICD-10-CM | POA: Diagnosis not present

## 2016-07-13 NOTE — Progress Notes (Signed)
He presents today for follow-up of his neuroma third interdigital space left foot. He has missed an alcohol injection because of his lip surgery for resection of cancer. He states that the neuroma is still very tender.  Objective: Vital signs are stable alert and oriented 3. Pulses are palpable. Neurologic sensorium is intact. Deep tendon reflexes are intact. He has a palpable Mulder's click to the third interdigital space of the left foot.  Assessment: Neuroma third interdigital space left foot.  Plan: Injected his fourth dose of dehydrated alcohol to the point of maximal tenderness today and I will follow-up with him in 3 weeks.

## 2016-08-03 ENCOUNTER — Encounter: Payer: Self-pay | Admitting: Podiatry

## 2016-08-03 ENCOUNTER — Ambulatory Visit (INDEPENDENT_AMBULATORY_CARE_PROVIDER_SITE_OTHER): Payer: BC Managed Care – PPO | Admitting: Podiatry

## 2016-08-03 DIAGNOSIS — G5762 Lesion of plantar nerve, left lower limb: Secondary | ICD-10-CM | POA: Diagnosis not present

## 2016-08-03 NOTE — Progress Notes (Signed)
He presents today for follow-up of his neuroma and dehydrated alcohol injections. He states that I can tell is still there and it is still painful by stepping correctly. It's much better than it was previously but at this point I was hoping it would be well.  Objective: Vital signs are stable he is alert and oriented 3. Palpable click third interdigital space of the left foot no open lesions or wounds.  Assessment: Neuroma third interdigital space left.  Plan: Injected his fifth dose of dehydrated alcohol today to the third interdigital space of the left foot. I will follow up with him in 3-4 weeks.

## 2016-08-12 ENCOUNTER — Encounter: Payer: Self-pay | Admitting: Cardiology

## 2016-08-22 ENCOUNTER — Encounter: Payer: Self-pay | Admitting: Podiatry

## 2016-08-22 ENCOUNTER — Ambulatory Visit (INDEPENDENT_AMBULATORY_CARE_PROVIDER_SITE_OTHER): Payer: BC Managed Care – PPO | Admitting: Podiatry

## 2016-08-22 DIAGNOSIS — G5762 Lesion of plantar nerve, left lower limb: Secondary | ICD-10-CM

## 2016-08-22 NOTE — Progress Notes (Signed)
He presents today with a chief complaint of neuroma third interdigital space of the left foot. He states that it feels a lot better. He states that he wasn't sure that he should come today.  Objective: Vital signs are stable alert and oriented 3. Pulses are palpable. Minimal pain on palpation third interdigital space of the left foot with a positive Mulder's click.  Assessment: Neuroma resolving approximately 95% left foot third interdigital space.  Plan: Injected the third interdigital space with dehydrated alcohol left foot. Follow up with him in 3-4 weeks if necessary.

## 2016-08-25 ENCOUNTER — Ambulatory Visit: Payer: BC Managed Care – PPO | Admitting: Cardiology

## 2016-08-28 NOTE — Progress Notes (Signed)
[name] Date of Birth: Jun 09, 1946 Medical Record [id number]  History of Present Illness: [name] is seen for follow up CAD.  He is status post stenting of the right coronary in 2006 with a Cypher stent. His last nuclear stress test in February 2010 showed no ischemia. Ejection fraction was 51%.  In May 2017 he was admitted with a near syncopal episode at church. He states he became very lightheaded and had to sit then lie down on the floor. He was responsive throughout and never lost consciousness. He was admitted. Ecg showed PACs and blocker PACs. Echo and carotid dopplers were unremarkable. CT of the head showed chronic white matter changes. He was DC home and an event monitor was placed. This showed one episode of paroxysmal Afib with controlled rate.  The patient was asymptomatic with this. He denies any further lightheadedness.   Current Outpatient Prescriptions on File Prior to Visit  Medication Sig Dispense Refill  . calcium gluconate 500 MG tablet Take 500 mg by mouth daily.      . cetirizine (ZYRTEC) 10 MG tablet Take 10 mg by mouth daily.      . Coenzyme Q10 (CO Q-10 PO) Take by mouth daily.      . fluticasone (FLONASE) 50 MCG/ACT nasal spray Place 2 sprays into the nose daily.      Marland Kitchen glipiZIDE (GLUCOTROL) 5 MG tablet Take 5 mg by mouth 2 (two) times daily before a meal.      . hydrocortisone (PROCTOZONE-HC) 2.5 % cream Apply 1 application topically 2 (two) times daily as needed.     . lansoprazole (PREVACID) 15 MG capsule Take 15 mg by mouth daily.      Marland Kitchen lisinopril (PRINIVIL,ZESTRIL) 20 MG tablet Take 20 mg by mouth daily.      . Magnesium (MAGNACAPS) 100 MG CAPS Take 1 capsule by mouth daily.      . metFORMIN (GLUCOPHAGE) 1000 MG tablet Take 1,000 mg by mouth 2 (two) times daily with a meal.      . metoprolol succinate (TOPROL-XL) 25 MG 24 hr tablet TAKE 1 AND 1/2 TABLETS EVERY DAY (Patient taking differently: TAKE 1/2 tablet in the morning and at night.) 45 tablet 0   . rivaroxaban (XARELTO) 20 MG TABS tablet Take 1 tablet (20 mg total) by mouth daily with supper. 30 tablet 11  . Tamsulosin HCl (FLOMAX) 0.4 MG CAPS Take 0.4 mg by mouth daily.      Marland Kitchen triamcinolone cream (KENALOG) 0.1 % Apply 1 application topically 2 (two) times daily as needed. For itching    . vitamin C (ASCORBIC ACID) 500 MG tablet Take 500 mg by mouth daily.      Marland Kitchen zinc gluconate 50 MG tablet Take 50 mg by mouth daily.       No current facility-administered medications on file prior to visit.     Allergies  Allergen Reactions  . Statins Anaphylaxis    Myalgias   . Bismuth Subsalicylate Nausea And Vomiting  . Codeine Nausea And Vomiting  . Morphine And Related Nausea And Vomiting  . Ramipril Rash    Past Medical History:  Diagnosis Date  . Arthritis   . Arthritis   . Atrial fibrillation ([place])   . BPH (benign prostatic hypertrophy)   . CAD (coronary artery disease)    stents placed  . Diabetes mellitus   . Diverticulosis   . Dysrhythmia    "extra beats"  . Esophageal stricture   . GERD (gastroesophageal reflux  disease)   . Headache(784.0)   . Hemorrhoids   . Hernia, inguinal, right   . Hiatal hernia   . Hyperlipidemia   . Hypertension   . Staph infection    Left ankle  . 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 Urinary hesitancy     Past Surgical History:  Procedure Laterality Date  . ANKLE ARTHROPLASTY  2006  . CARDIOVASCULAR STRESS TEST  01/22/2009   EF 51%, NO ISCHEMIA  . CARDIOVASCULAR STRESS TEST    . COLONOSCOPY    . CORONARY ANGIOPLASTY WITH STENT PLACEMENT  03/2005  . CORONARY STENT PLACEMENT  03/10/2005   SUCCESSFUL STENTING OF THE MID AND CRUX OF THE RCA  . ESOPHAGEAL DILATION     multiple procedures  . HERNIA REPAIR  [id number]   umbilical hernia  . INGUINAL HERNIA REPAIR  11/28/2011   Procedure: HERNIA REPAIR INGUINAL ADULT;  Surgeon: Imogene Burn. Georgette Dover, MD;  Location: [facility];  Service: General;  Laterality: Right;  right inguinal hernia repair  with mesh  . KNEE SURGERY     Right  . POLYPECTOMY    . TONSILLECTOMY  1958  . UMBILICAL HERNIA REPAIR  11/2006  . US ECHOCARDIOGRAPHY      History  Smoking Status  . Former Smoker  Smokeless Tobacco  . Never Used    History  Alcohol Use No    Family History  Problem Relation Age of Onset  . Heart disease Mother   . Heart disease Sister   . Heart disease Brother   . Heart disease Maternal Grandfather   . Ovarian cancer Paternal Grandmother   . Cancer Paternal Grandmother     cervical  . Colon cancer Neg Hx   . Esophageal cancer Neg Hx   . Stomach cancer Neg Hx     Review of Systems: The review of systems is positive for chronic back pain related to an old fall injury.  All other systems were reviewed and are negative.  Physical Exam: BP (!) 144/60 (BP Location: Right Arm, Cuff Size: Normal)   Pulse (!) 56   Ht 5\' 9"  (1.753 m)   Wt 160 lb 9.6 oz (72.8 kg)   BMI 23.72 kg/m  The patient is alert and oriented x 3.  The mood and affect are normal.  The skin is warm and dry.  Color is normal.  The HEENT exam reveals that the sclera are nonicteric.  The mucous membranes are moist.  The carotids are 2+ without bruits.  There is no thyromegaly.  There is no JVD.  The lungs are clear.  he heart exam reveals a regular rate with a normal S1 and S2.  There are no murmurs, gallops, or rubs.  The PMI is not displaced.   Abdominal exam reveals good bowel sounds.  There is no guarding or rebound.   There are no masses.  Exam of the legs reveal no clubbing, cyanosis, or edema.   The distal pulses are intact.  Cranial nerves II - XII are intact.  Motor and sensory functions are intact.  The gait is normal.  LABORATORY DATA:  Event monitor tracing from 04/14/16 reviewed and shows Afib with controlled rate.   Lab Results  Component Value Date   WBC 8.9 04/10/2016   HGB 14.2 04/10/2016   HCT 42.1 04/10/2016   PLT 153 04/10/2016   GLUCOSE 219 (H) 04/10/2016   ALT 18 12/17/2007   AST  19 12/17/2007   NA 137 04/10/2016   K  4.0 04/10/2016   CL 97 (L) 04/10/2016   CREATININE 1.18 04/10/2016   BUN 17 04/10/2016   CO2 24 04/10/2016   HGBA1C 7.3 (H) 04/10/2016   Labs reviewed from primary care dated 07/26/16: cholesterol 178, triglycerides 110, LDL 118, HDL 38. A1c 6.7%. CMET, CBC, and TSH were normal.  Echo: 04/11/16:Study Conclusions  - Left ventricle: The cavity size was normal. Wall thickness was  increased in a pattern of mild LVH. Systolic function was normal.  The estimated ejection fraction was in the range of 50% to 55%.  LV apical false tendon. Wall motion was normal; there were no  regional wall motion abnormalities. Left ventricular diastolic  function parameters were normal. - Left atrium: The atrium was mildly dilated. - Right atrium: The atrium was mildly dilated. - Atrial septum: Poorly visualized. - Systemic veins: The IVC measures <2.1 cm, but does not collapse  >50%, suggesting an elevated RA Pressure of 8 mmHg.  Assessment / Plan: 1. Paroxysmal Afib. Asymptomatic at time arrhythmia recorded. Rate controlled on metoprolol. [place] vasc score of 3. Recommend long term anticoagulation to reduce risk of CVA. Continue Xarelto 20 mg daily. He should avoid ASA and NSAIDs.   2. CAD with remote stent RCA. Asymptomatic.   3. DM type 2  4. HTN controlled.   5. Hyperlipidemia. States he has tried all cholesterol meds including statins which he cannot tolerate due to myalgias. Unable to get PCSK 9 inhibitor due to lack of insurance coverage.   Follow up in one year.

## 2016-08-29 ENCOUNTER — Ambulatory Visit (INDEPENDENT_AMBULATORY_CARE_PROVIDER_SITE_OTHER): Payer: BC Managed Care – PPO | Admitting: Cardiology

## 2016-08-29 ENCOUNTER — Encounter: Payer: Self-pay | Admitting: Cardiology

## 2016-08-29 VITALS — BP 144/60 | HR 56 | Ht 69.0 in | Wt 160.6 lb

## 2016-08-29 DIAGNOSIS — I480 Paroxysmal atrial fibrillation: Secondary | ICD-10-CM

## 2016-08-29 DIAGNOSIS — E785 Hyperlipidemia, unspecified: Secondary | ICD-10-CM | POA: Diagnosis not present

## 2016-08-29 DIAGNOSIS — I10 Essential (primary) hypertension: Secondary | ICD-10-CM | POA: Diagnosis not present

## 2016-08-29 DIAGNOSIS — I2510 Atherosclerotic heart disease of native coronary artery without angina pectoris: Secondary | ICD-10-CM | POA: Diagnosis not present

## 2016-08-29 NOTE — Patient Instructions (Signed)
Continue your current therapy  I will see you in one year   

## 2016-09-14 ENCOUNTER — Encounter (INDEPENDENT_AMBULATORY_CARE_PROVIDER_SITE_OTHER): Payer: BC Managed Care – PPO | Admitting: Podiatry

## 2016-09-14 NOTE — Progress Notes (Signed)
This encounter was created in error - please disregard.

## 2018-03-05 IMAGING — CT CT HEAD W/O CM
4 series · 16 of 47 positions shown, 18 images · non-contrast
Comparison: None.

CLINICAL DATA: 69-year-old diabetic hypertensive male with syncope
and collapse history feeling dizzy. Initial encounter.

EXAM:
CT HEAD WITHOUT CONTRAST
TECHNIQUE: Contiguous axial images were obtained from the base of the skull
through the vertex without intravenous contrast.

[Series 2: head without · axial · non-contrast · 0.42mm/px · z∈[-130,-20]mm · 7 of 30 slices shown, 9 images]
[im 4/30  brain]
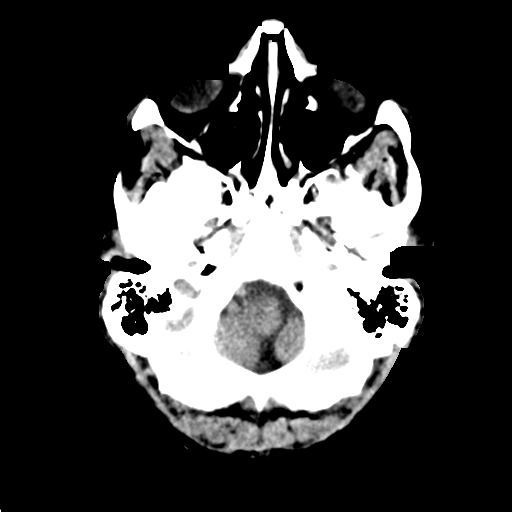
[im 4/30  bone]
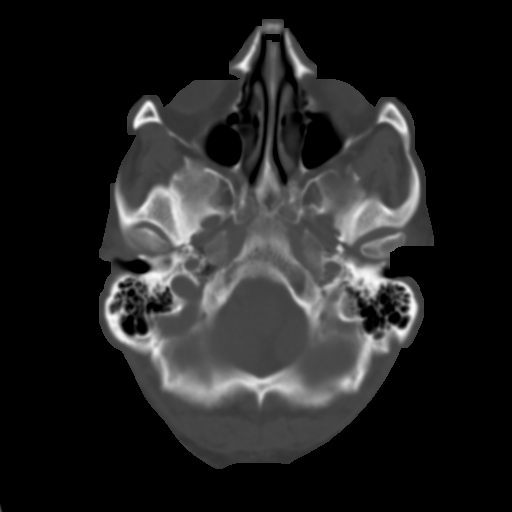
[im 8/30  brain]
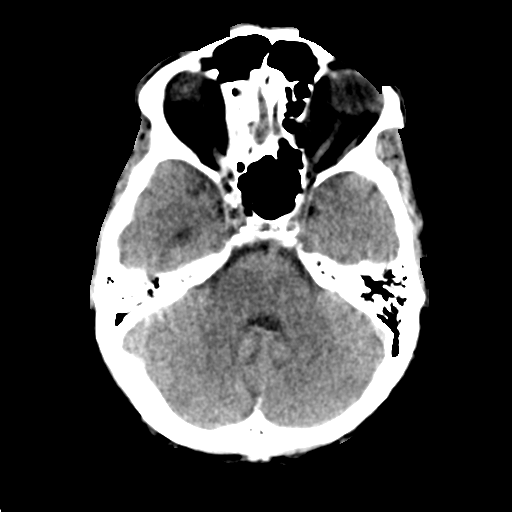
[im 11/30  brain]
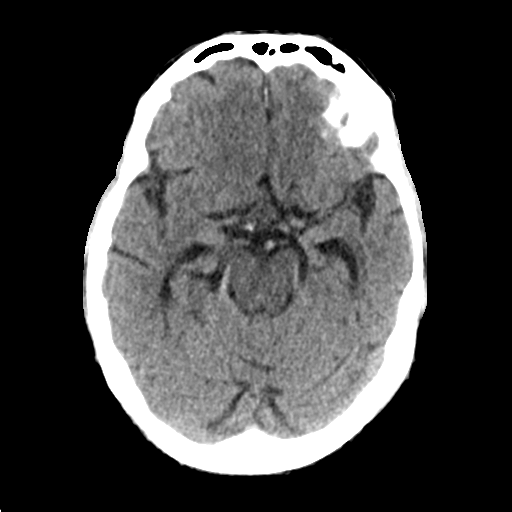
[im 15/30  brain]
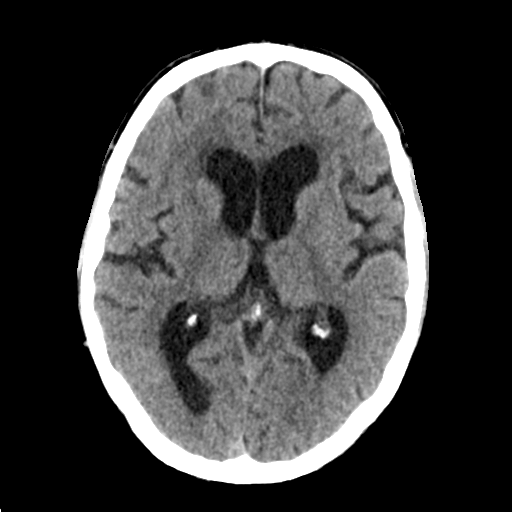
[im 19/30  brain]
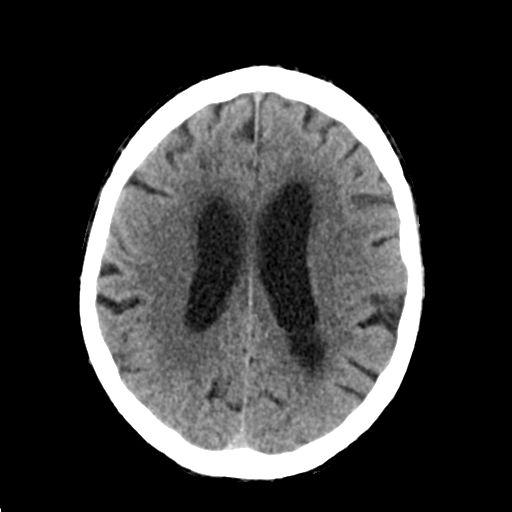
[im 19/30  bone]
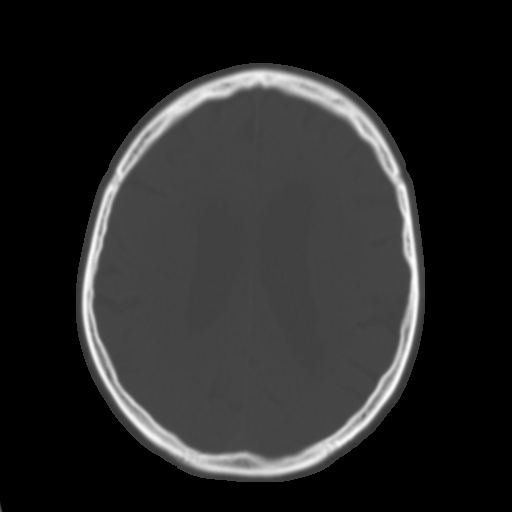
[im 22/30  brain]
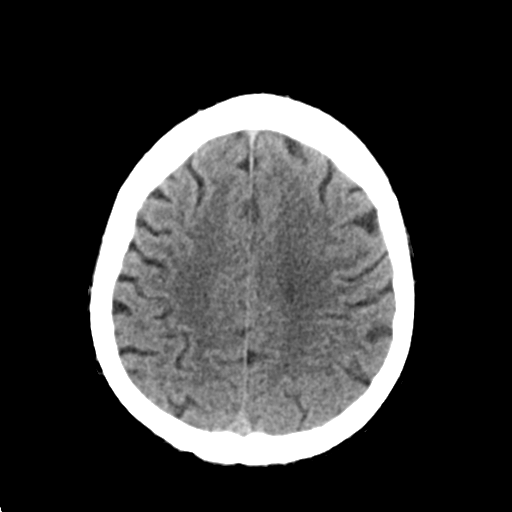
[im 26/30  brain]
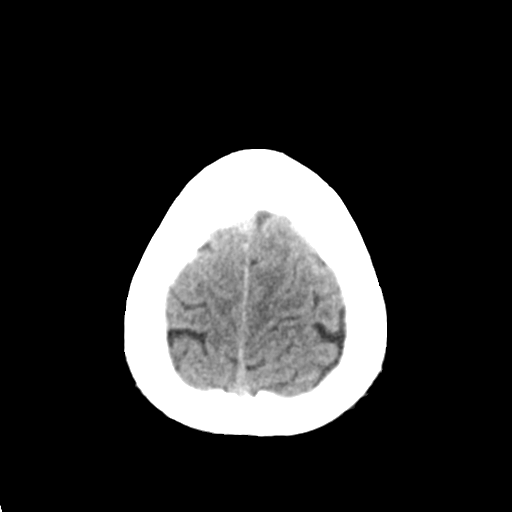

[Series 3: head bone · axial · 0.42mm/px · z∈[-131,-101]mm · 3 of 75 slices shown]
[im 8/75  bone]
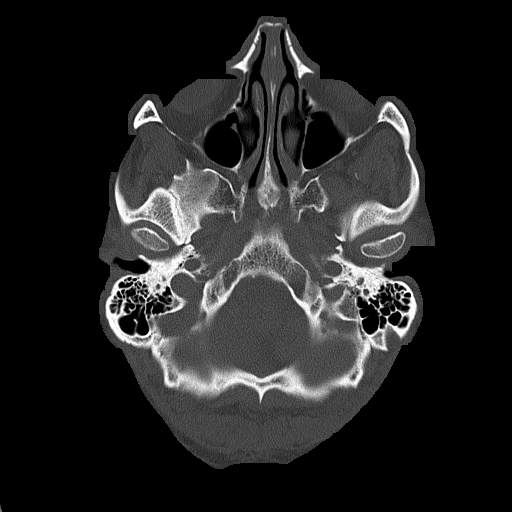
[im 15/75  bone]
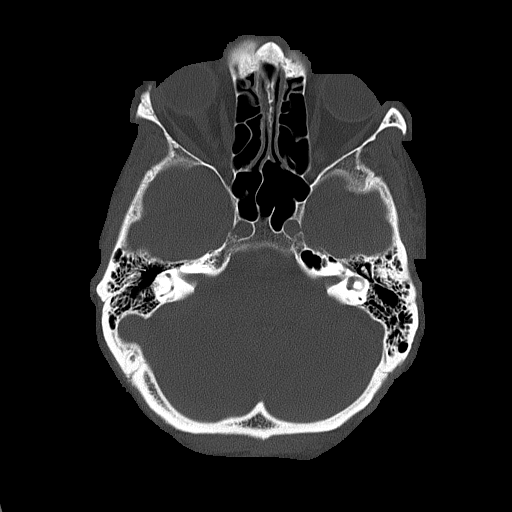
[im 23/75  bone]
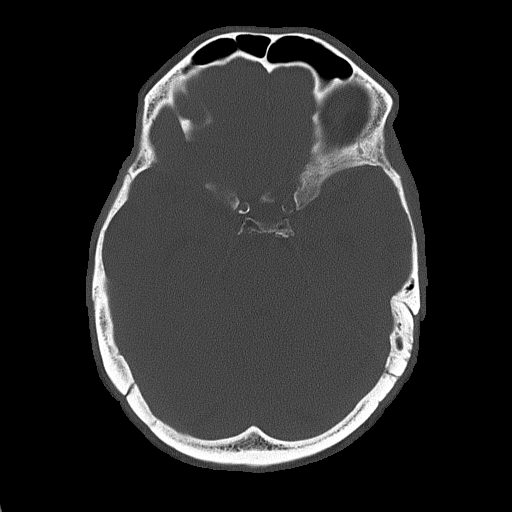

[Series 4: head without cor · coronal · non-contrast · 0.31mm/px · 3 of 68 slices shown]
[im 24/68  brain]
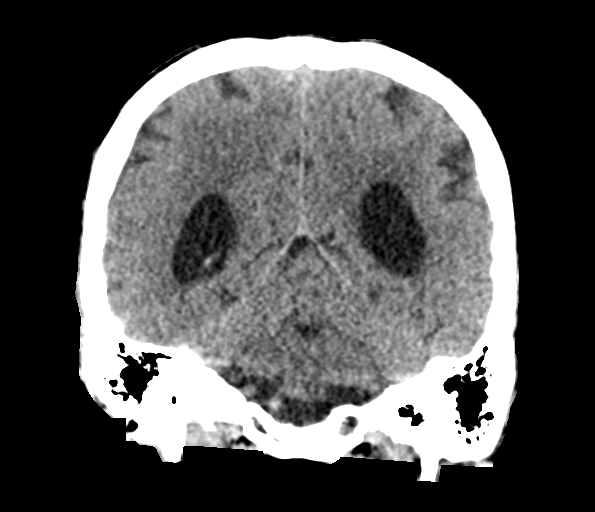
[im 31/68  brain]
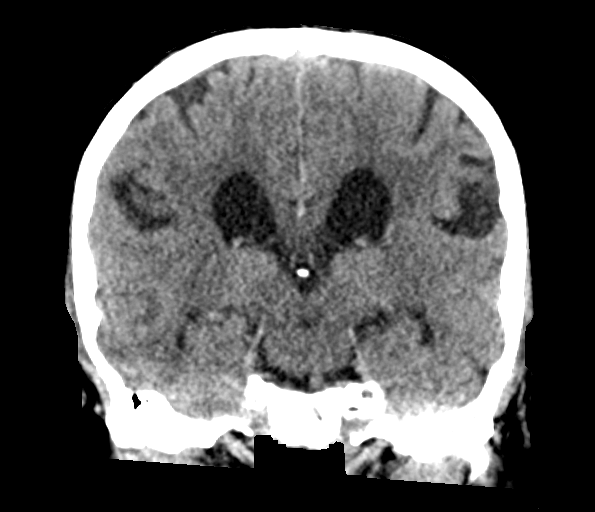
[im 37/68  brain]
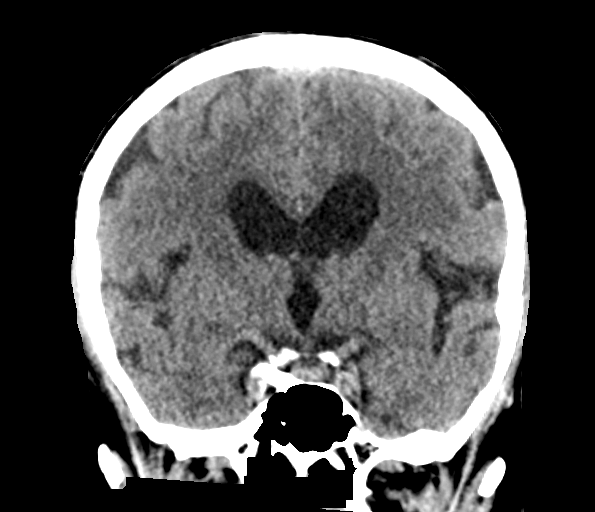

[Series 5: head without sag · sagittal · non-contrast · 0.31mm/px · 3 of 54 slices shown]
[im 18/54  brain]
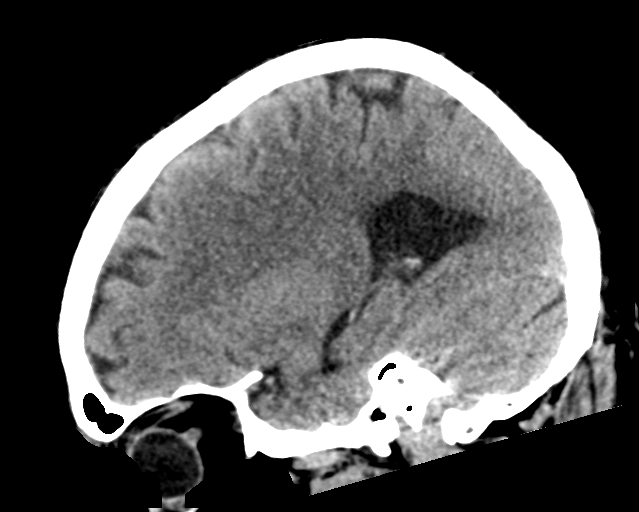
[im 27/54  brain]
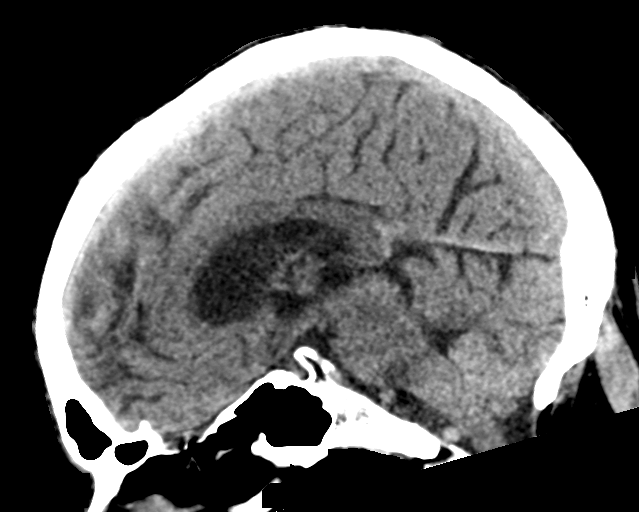
[im 36/54  brain]
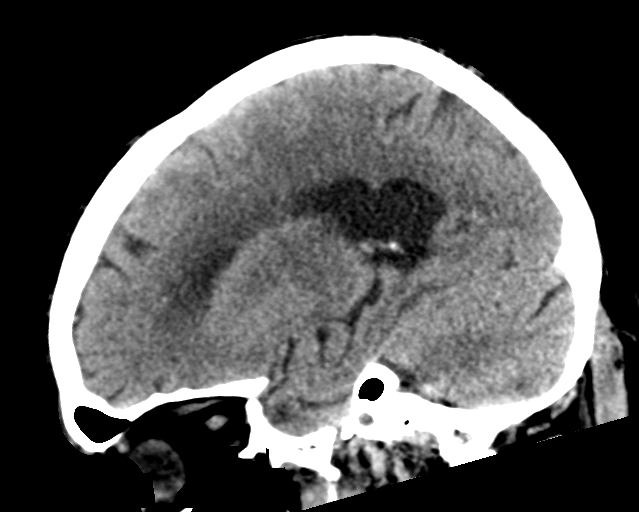

[16 of 47 positions shown; findings below may reference images not displayed]

FINDINGS: No skull fracture or intracranial hemorrhage.

Mild chronic microvascular changes without CT evidence of large
acute infarct.

Global mild atrophy without hydrocephalus.

No intracranial mass lesion noted on this unenhanced exam.

Vascular calcifications.

Orbital structures unremarkable.

Mastoid air cells, middle ear cavities and visualized paranasal
sinuses are clear.
IMPRESSION: No skull fracture or intracranial hemorrhage.

Mild chronic microvascular changes without CT evidence of large
acute infarct.

Global mild atrophy.

## 2019-07-15 ENCOUNTER — Telehealth: Payer: Self-pay | Admitting: Cardiology

## 2019-07-15 NOTE — Telephone Encounter (Signed)
Recall - 07-15-19 vm was full

## 2019-11-07 ENCOUNTER — Telehealth: Payer: Self-pay | Admitting: Cardiology

## 2019-11-07 NOTE — Telephone Encounter (Signed)
Spoke with pt's wife and pt's B/P has been creeping up Per wife pt recently saw PCP and adjusted meds to help lower B/P Wife is wanting appt Pt has not seen Dr Martinique since 2017 Appt made for 11/22/19 at 1:20 pm with Dr Martinique. Encouraged wife to keep B/p log and bring readings  to upcoming appt .Instructed wife if B/P remains high to call PCP since was just seen if more adjustments needed.Pt's wife verbalizes understanding .Will let Dr Martinique know .Adonis Housekeeper

## 2019-11-07 NOTE — Telephone Encounter (Signed)
Pt c/o BP issue: STAT if pt c/o blurred vision, one-sided weakness or slurred speech  1. What are your last 5 BP readings? 186/86 HR 61 2. Are you having any other symptoms (ex. Dizziness, headache, blurred vision, passed out)? Neck and shoulders hurting.  3. What is your BP issue? BP keeps gong up and down. Also patient wife said his primary states him on a medication. Patient would like to get appt with the doctor.

## 2019-11-15 ENCOUNTER — Telehealth: Payer: Self-pay | Admitting: Cardiology

## 2019-11-15 NOTE — Telephone Encounter (Signed)
Pts wife, Nevin Bloodgood,  is going to call Dr. Brigitte Pulse the pts PCP re: the pts BP... he has been managing the pts BP and Dr. Martinique has not seen him since 2017.. he will be re-established per their request at the previously scheduled appt 11/22/19.

## 2019-11-15 NOTE — Telephone Encounter (Signed)
New Message   Pt c/o BP issue: STAT if pt c/o blurred vision, one-sided weakness or slurred speech  1. What are your last 5 BP readings? 160/86 145/83 162/76 169/85 187/81   2. Are you having any other symptoms (ex. Dizziness, headache, blurred vision, passed out)? Blood sugar goes up  3. What is your BP issue?Patient's b/p is elevated

## 2019-11-20 NOTE — H&P (View-Only) (Signed)
[name] Date of Birth: [birth year] Medical Record [id number]  History of Present Illness: Mr. Collinsworth is seen for evaluation of multiple complaints. He was last seen by me in September 2017.He has a history of CAD.   He is status post stenting of the right coronary in 2006 with a Cypher stent. His last nuclear stress test in February 2010 showed no ischemia. Ejection fraction was 51%. He has a history of HTN, DM, and HLD.  In May 2017 he was admitted with a near syncopal episode at church. He states he became very lightheaded and had to sit then lie down on the floor. He was responsive throughout and never lost consciousness. He was admitted. Ecg showed PACs and blocker PACs. Echo and carotid dopplers were unremarkable. CT of the head showed chronic white matter changes. He was DC home and an event monitor was placed. This showed one episode of paroxysmal Afib with controlled rate.  The patient was asymptomatic with this. He was anticoagulated with Xarelto.   On follow up today most of history is supplied by his wife. Over the past month he complains of not feeling well, generalized malaise, nausea,, fleeting chest pain and shoulder pain. BP has been high up to 186/92. Recently placed on Toprol by Dr Brigitte Pulse - BP still fluctuating. He is spending a lot more time in bed which is unusual for him. Wife states memory is worse. He is no longer on Xarelto. Not on ASA.   Current Outpatient Medications on File Prior to Visit  Medication Sig Dispense Refill  . calcium gluconate 500 MG tablet Take 500 mg by mouth daily.      . cetirizine (ZYRTEC) 10 MG tablet Take 10 mg by mouth daily.      . Coenzyme Q10 (CO Q-10 PO) Take by mouth daily.      . fluticasone (FLONASE) 50 MCG/ACT nasal spray Place 2 sprays into the nose daily.      . lansoprazole (PREVACID) 15 MG capsule Take 15 mg by mouth daily.      Marland Kitchen lisinopril (PRINIVIL,ZESTRIL) 20 MG tablet Take 40 mg by mouth daily.      . Magnesium (MAGNACAPS)  100 MG CAPS Take 1 capsule by mouth daily.      . metFORMIN (GLUCOPHAGE) 1000 MG tablet Take 500 mg by mouth 2 (two) times daily with a meal.     . metoprolol succinate (TOPROL-XL) 25 MG 24 hr tablet TAKE 1 AND 1/2 TABLETS EVERY DAY (Patient taking differently: Take 25 mg by mouth daily. ) 45 tablet 0  . Tamsulosin HCl (FLOMAX) 0.4 MG CAPS Take 0.4 mg by mouth daily.      Marland Kitchen triamcinolone cream (KENALOG) 0.1 % Apply 1 application topically 2 (two) times daily as needed. For itching    . vitamin C (ASCORBIC ACID) 500 MG tablet Take 500 mg by mouth daily.      Marland Kitchen zinc gluconate 50 MG tablet Take 50 mg by mouth daily.       No current facility-administered medications on file prior to visit.    Allergies  Allergen Reactions  . Statins Anaphylaxis    Myalgias   . Bismuth Subsalicylate Nausea And Vomiting  . Codeine Nausea And Vomiting  . Morphine And Related Nausea And Vomiting  . Ramipril Rash    Past Medical History:  Diagnosis Date  . Arthritis   . Arthritis   . Atrial fibrillation ([place])   . BPH (benign prostatic hypertrophy)   . CAD (  coronary artery disease)    stents placed  . Diabetes mellitus   . Diverticulosis   . Dysrhythmia    "extra beats"  . Esophageal stricture   . GERD (gastroesophageal reflux disease)   . Headache(784.0)   . Hemorrhoids   . Hernia, inguinal, right   . Hiatal hernia   . Hyperlipidemia   . Hypertension   . Staph infection    Left ankle  . 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 Urinary hesitancy     Past Surgical History:  Procedure Laterality Date  . ANKLE ARTHROPLASTY  2006  . CARDIOVASCULAR STRESS TEST  01/22/2009   EF 51%, NO ISCHEMIA  . CARDIOVASCULAR STRESS TEST    . COLONOSCOPY    . CORONARY ANGIOPLASTY WITH STENT PLACEMENT  03/2005  . CORONARY STENT PLACEMENT  03/10/2005   SUCCESSFUL STENTING OF THE MID AND CRUX OF THE RCA  . ESOPHAGEAL DILATION     multiple procedures  . HERNIA REPAIR  [id number]   umbilical hernia  .  INGUINAL HERNIA REPAIR  11/28/2011   Procedure: HERNIA REPAIR INGUINAL ADULT;  Surgeon: Imogene Burn. Georgette Dover, MD;  Location: [facility];  Service: General;  Laterality: Right;  right inguinal hernia repair with mesh  . KNEE SURGERY     Right  . POLYPECTOMY    . TONSILLECTOMY  1958  . UMBILICAL HERNIA REPAIR  11/2006  . US ECHOCARDIOGRAPHY      Social History   Tobacco Use  Smoking Status Former Smoker  Smokeless Tobacco Never Used    Social History   Substance and Sexual Activity  Alcohol Use No  . Alcohol/week: 0.0 standard drinks    Family History  Problem Relation Age of Onset  . Heart disease Mother   . Heart disease Sister   . Heart disease Brother   . Heart disease Maternal Grandfather   . Ovarian cancer Paternal Grandmother   . Cancer Paternal Grandmother        cervical  . Colon cancer Neg Hx   . Esophageal cancer Neg Hx   . Stomach cancer Neg Hx     Review of Systems: The review of systems is positive for chronic back pain related to an old fall injury.  All other systems were reviewed and are negative.  Physical Exam: BP (!) 164/80   Pulse 60   Ht 5' 10.5" (1.791 m)   Wt 157 lb 9.6 oz (71.5 kg)   SpO2 98%   BMI 22.29 kg/m  GENERAL:  Well appearing WM in NAD HEENT:  PERRL, EOMI, sclera are clear. Oropharynx is clear. He is hard of hearing NECK:  No jugular venous distention, carotid upstroke brisk and symmetric, no bruits, no thyromegaly or adenopathy LUNGS:  Clear to auscultation bilaterally CHEST:  Unremarkable HEART:  RRR,  PMI not displaced or sustained,S1 and S2 within normal limits, no S3, no S4: no clicks, no rubs, no murmurs ABD:  Soft, nontender. BS +, no masses or bruits. No hepatomegaly, no splenomegaly EXT:  2 + pulses throughout, no edema, no cyanosis no clubbing SKIN:  Warm and dry.  No rashes NEURO:  Alert and oriented x 3. Cranial nerves II through XII intact. PSYCH:  Cognitively intact    LABORATORY DATA:  Ecg today shows NSR rate 60  with LAD, LVH and old inferior infarct. Chronic ST elevation in leads Avf, V1-3. I have personally reviewed and interpreted this study.   Lab Results  Component Value Date   WBC 8.9 04/10/2016  HGB 14.2 04/10/2016   HCT 42.1 04/10/2016   PLT 153 04/10/2016   GLUCOSE 219 (H) 04/10/2016   ALT 18 12/17/2007   AST 19 12/17/2007   NA 137 04/10/2016   K 4.0 04/10/2016   CL 97 (L) 04/10/2016   CREATININE 1.18 04/10/2016   BUN 17 04/10/2016   CO2 24 04/10/2016   HGBA1C 7.3 (H) 04/10/2016   Labs reviewed from primary care dated 07/26/16: cholesterol 178, triglycerides 110, LDL 118, HDL 38. A1c 6.7%. CMET, CBC, and TSH were normal. Dated 04/09/19: cholesterol 193, triglycerides 69, HDL 46, LDL 133. Creatinine 1.2. otherwise CMET, CBC, TSH normal.  Dated 10/23/19: A1c 6.7%.   Echo: 04/11/16:Study Conclusions  - Left ventricle: The cavity size was normal. Wall thickness was  increased in a pattern of mild LVH. Systolic function was normal.  The estimated ejection fraction was in the range of 50% to 55%.  LV apical false tendon. Wall motion was normal; there were no  regional wall motion abnormalities. Left ventricular diastolic  function parameters were normal. - Left atrium: The atrium was mildly dilated. - Right atrium: The atrium was mildly dilated. - Atrial septum: Poorly visualized. - Systemic veins: The IVC measures <2.1 cm, but does not collapse  >50%, suggesting an elevated RA Pressure of 8 mmHg.  Assessment / Plan: 1. Paroxysmal Afib. Asymptomatic at time arrhythmia recorded.  [place] vasc score of 3. For some reason Xarelto discontinued. No clear recurrence of Afib. In NSR today. States he had to have recurrent Moh's surgery and that's why it was stopped.   2. CAD with remote stent RCA. He is having a number of complaints including atypical chest pain, generalized malaise and fatigue. Will obtain a  Lexiscan myoview to assess ischemic risk. Recommend he resume ASA 81 mg daily  since he is no longer on anticoagulation  3. DM type 2. A1c 6.7%. management per primary care.   4. HTN poorly controlled. Recommend increase lisinopril to 40 mg daily. Continue Toprol XL  5. Hyperlipidemia. LDL 133. Goal < 70. States he has tried all cholesterol meds including statins which he cannot tolerate due to myalgias. Previously Unable to get PCSK 9 inhibitor due to lack of insurance coverage but this was several years ago. We should reloook at insurance coverage for this since it is probably much better now.   Follow up iTBD

## 2019-11-20 NOTE — Progress Notes (Signed)
[name] Date of Birth: [birth year] Medical Record [id number]  History of Present Illness: Mr. Mulloy is seen for evaluation of multiple complaints. He was last seen by me in September 2017.He has a history of CAD.   He is status post stenting of the right coronary in 2006 with a Cypher stent. His last nuclear stress test in February 2010 showed no ischemia. Ejection fraction was 51%. He has a history of HTN, DM, and HLD.  In May 2017 he was admitted with a near syncopal episode at church. He states he became very lightheaded and had to sit then lie down on the floor. He was responsive throughout and never lost consciousness. He was admitted. Ecg showed PACs and blocker PACs. Echo and carotid dopplers were unremarkable. CT of the head showed chronic white matter changes. He was DC home and an event monitor was placed. This showed one episode of paroxysmal Afib with controlled rate.  The patient was asymptomatic with this. He was anticoagulated with Xarelto.   On follow up today most of history is supplied by his wife. Over the past month he complains of not feeling well, generalized malaise, nausea,, fleeting chest pain and shoulder pain. BP has been high up to 186/92. Recently placed on Toprol by Dr Brigitte Pulse - BP still fluctuating. He is spending a lot more time in bed which is unusual for him. Wife states memory is worse. He is no longer on Xarelto. Not on ASA.   Current Outpatient Medications on File Prior to Visit  Medication Sig Dispense Refill  . calcium gluconate 500 MG tablet Take 500 mg by mouth daily.      . cetirizine (ZYRTEC) 10 MG tablet Take 10 mg by mouth daily.      . Coenzyme Q10 (CO Q-10 PO) Take by mouth daily.      . fluticasone (FLONASE) 50 MCG/ACT nasal spray Place 2 sprays into the nose daily.      . lansoprazole (PREVACID) 15 MG capsule Take 15 mg by mouth daily.      Marland Kitchen lisinopril (PRINIVIL,ZESTRIL) 20 MG tablet Take 40 mg by mouth daily.      . Magnesium (MAGNACAPS)  100 MG CAPS Take 1 capsule by mouth daily.      . metFORMIN (GLUCOPHAGE) 1000 MG tablet Take 500 mg by mouth 2 (two) times daily with a meal.     . metoprolol succinate (TOPROL-XL) 25 MG 24 hr tablet TAKE 1 AND 1/2 TABLETS EVERY DAY (Patient taking differently: Take 25 mg by mouth daily. ) 45 tablet 0  . Tamsulosin HCl (FLOMAX) 0.4 MG CAPS Take 0.4 mg by mouth daily.      Marland Kitchen triamcinolone cream (KENALOG) 0.1 % Apply 1 application topically 2 (two) times daily as needed. For itching    . vitamin C (ASCORBIC ACID) 500 MG tablet Take 500 mg by mouth daily.      Marland Kitchen zinc gluconate 50 MG tablet Take 50 mg by mouth daily.       No current facility-administered medications on file prior to visit.    Allergies  Allergen Reactions  . Statins Anaphylaxis    Myalgias   . Bismuth Subsalicylate Nausea And Vomiting  . Codeine Nausea And Vomiting  . Morphine And Related Nausea And Vomiting  . Ramipril Rash    Past Medical History:  Diagnosis Date  . Arthritis   . Arthritis   . Atrial fibrillation ([place])   . BPH (benign prostatic hypertrophy)   . CAD (  coronary artery disease)    stents placed  . Diabetes mellitus   . Diverticulosis   . Dysrhythmia    "extra beats"  . Esophageal stricture   . GERD (gastroesophageal reflux disease)   . Headache(784.0)   . Hemorrhoids   . Hernia, inguinal, right   . Hiatal hernia   . Hyperlipidemia   . Hypertension   . Staph infection    Left ankle  . 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 Urinary hesitancy     Past Surgical History:  Procedure Laterality Date  . ANKLE ARTHROPLASTY  2006  . CARDIOVASCULAR STRESS TEST  01/22/2009   EF 51%, NO ISCHEMIA  . CARDIOVASCULAR STRESS TEST    . COLONOSCOPY    . CORONARY ANGIOPLASTY WITH STENT PLACEMENT  03/2005  . CORONARY STENT PLACEMENT  03/10/2005   SUCCESSFUL STENTING OF THE MID AND CRUX OF THE RCA  . ESOPHAGEAL DILATION     multiple procedures  . HERNIA REPAIR  [id number]   umbilical hernia  .  INGUINAL HERNIA REPAIR  11/28/2011   Procedure: HERNIA REPAIR INGUINAL ADULT;  Surgeon: Imogene Burn. Georgette Dover, MD;  Location: [facility];  Service: General;  Laterality: Right;  right inguinal hernia repair with mesh  . KNEE SURGERY     Right  . POLYPECTOMY    . TONSILLECTOMY  1958  . UMBILICAL HERNIA REPAIR  11/2006  . US ECHOCARDIOGRAPHY      Social History   Tobacco Use  Smoking Status Former Smoker  Smokeless Tobacco Never Used    Social History   Substance and Sexual Activity  Alcohol Use No  . Alcohol/week: 0.0 standard drinks    Family History  Problem Relation Age of Onset  . Heart disease Mother   . Heart disease Sister   . Heart disease Brother   . Heart disease Maternal Grandfather   . Ovarian cancer Paternal Grandmother   . Cancer Paternal Grandmother        cervical  . Colon cancer Neg Hx   . Esophageal cancer Neg Hx   . Stomach cancer Neg Hx     Review of Systems: The review of systems is positive for chronic back pain related to an old fall injury.  All other systems were reviewed and are negative.  Physical Exam: BP (!) 164/80   Pulse 60   Ht 5' 10.5" (1.791 m)   Wt 157 lb 9.6 oz (71.5 kg)   SpO2 98%   BMI 22.29 kg/m  GENERAL:  Well appearing WM in NAD HEENT:  PERRL, EOMI, sclera are clear. Oropharynx is clear. He is hard of hearing NECK:  No jugular venous distention, carotid upstroke brisk and symmetric, no bruits, no thyromegaly or adenopathy LUNGS:  Clear to auscultation bilaterally CHEST:  Unremarkable HEART:  RRR,  PMI not displaced or sustained,S1 and S2 within normal limits, no S3, no S4: no clicks, no rubs, no murmurs ABD:  Soft, nontender. BS +, no masses or bruits. No hepatomegaly, no splenomegaly EXT:  2 + pulses throughout, no edema, no cyanosis no clubbing SKIN:  Warm and dry.  No rashes NEURO:  Alert and oriented x 3. Cranial nerves II through XII intact. PSYCH:  Cognitively intact    LABORATORY DATA:  Ecg today shows NSR rate 60  with LAD, LVH and old inferior infarct. Chronic ST elevation in leads Avf, V1-3. I have personally reviewed and interpreted this study.   Lab Results  Component Value Date   WBC 8.9 04/10/2016  HGB 14.2 04/10/2016   HCT 42.1 04/10/2016   PLT 153 04/10/2016   GLUCOSE 219 (H) 04/10/2016   ALT 18 12/17/2007   AST 19 12/17/2007   NA 137 04/10/2016   K 4.0 04/10/2016   CL 97 (L) 04/10/2016   CREATININE 1.18 04/10/2016   BUN 17 04/10/2016   CO2 24 04/10/2016   HGBA1C 7.3 (H) 04/10/2016   Labs reviewed from primary care dated 07/26/16: cholesterol 178, triglycerides 110, LDL 118, HDL 38. A1c 6.7%. CMET, CBC, and TSH were normal. Dated 04/09/19: cholesterol 193, triglycerides 69, HDL 46, LDL 133. Creatinine 1.2. otherwise CMET, CBC, TSH normal.  Dated 10/23/19: A1c 6.7%.   Echo: 04/11/16:Study Conclusions  - Left ventricle: The cavity size was normal. Wall thickness was  increased in a pattern of mild LVH. Systolic function was normal.  The estimated ejection fraction was in the range of 50% to 55%.  LV apical false tendon. Wall motion was normal; there were no  regional wall motion abnormalities. Left ventricular diastolic  function parameters were normal. - Left atrium: The atrium was mildly dilated. - Right atrium: The atrium was mildly dilated. - Atrial septum: Poorly visualized. - Systemic veins: The IVC measures <2.1 cm, but does not collapse  >50%, suggesting an elevated RA Pressure of 8 mmHg.  Assessment / Plan: 1. Paroxysmal Afib. Asymptomatic at time arrhythmia recorded.  Mali vasc score of 3. For some reason Xarelto discontinued. No clear recurrence of Afib. In NSR today. States he had to have recurrent Moh's surgery and that's why it was stopped.   2. CAD with remote stent RCA. He is having a number of complaints including atypical chest pain, generalized malaise and fatigue. Will obtain a  Lexiscan myoview to assess ischemic risk. Recommend he resume ASA 81 mg daily  since he is no longer on anticoagulation  3. DM type 2. A1c 6.7%. management per primary care.   4. HTN poorly controlled. Recommend increase lisinopril to 40 mg daily. Continue Toprol XL  5. Hyperlipidemia. LDL 133. Goal < 70. States he has tried all cholesterol meds including statins which he cannot tolerate due to myalgias. Previously Unable to get PCSK 9 inhibitor due to lack of insurance coverage but this was several years ago. We should reloook at insurance coverage for this since it is probably much better now.   Follow up iTBD

## 2019-11-22 ENCOUNTER — Encounter (INDEPENDENT_AMBULATORY_CARE_PROVIDER_SITE_OTHER): Payer: Self-pay

## 2019-11-22 ENCOUNTER — Encounter: Payer: Self-pay | Admitting: Cardiology

## 2019-11-22 ENCOUNTER — Telehealth (HOSPITAL_COMMUNITY): Payer: Self-pay | Admitting: *Deleted

## 2019-11-22 ENCOUNTER — Ambulatory Visit: Payer: BC Managed Care – PPO | Admitting: Cardiology

## 2019-11-22 ENCOUNTER — Other Ambulatory Visit: Payer: Self-pay

## 2019-11-22 VITALS — BP 164/80 | HR 60 | Ht 70.5 in | Wt 157.6 lb

## 2019-11-22 DIAGNOSIS — I25118 Atherosclerotic heart disease of native coronary artery with other forms of angina pectoris: Secondary | ICD-10-CM | POA: Diagnosis not present

## 2019-11-22 DIAGNOSIS — R072 Precordial pain: Secondary | ICD-10-CM

## 2019-11-22 DIAGNOSIS — I10 Essential (primary) hypertension: Secondary | ICD-10-CM

## 2019-11-22 DIAGNOSIS — E7800 Pure hypercholesterolemia, unspecified: Secondary | ICD-10-CM | POA: Diagnosis not present

## 2019-11-22 NOTE — Patient Instructions (Addendum)
Increase lisinopril to 40 mg daily  Start taking Aspirin 81 mg daily  We will schedule you for a nuclear stress test  Lexiscan   Follow up appointment in 3 months

## 2019-11-22 NOTE — Telephone Encounter (Signed)
Close encounter 

## 2019-11-25 ENCOUNTER — Telehealth: Payer: Self-pay

## 2019-11-25 NOTE — Telephone Encounter (Signed)
Received a call from patient's wife.She stated husband has a swollen tooth, right lower front tooth,jaw is swollen.No pain.Face itches.Stated he has appointment with dentist on Wed.Dentist advised to call Dr.Jordan to see if he will prescribe a antibiotic.Dr.Jordan advised to keep appointment with dentist.

## 2019-11-26 ENCOUNTER — Ambulatory Visit (HOSPITAL_COMMUNITY)
Admission: RE | Admit: 2019-11-26 | Discharge: 2019-11-26 | Disposition: A | Discharge status: Home / Self Care | Payer: BC Managed Care – PPO | Source: Ambulatory Visit | Attending: Cardiology | Admitting: Cardiology

## 2019-11-26 ENCOUNTER — Telehealth: Payer: Self-pay

## 2019-11-26 ENCOUNTER — Other Ambulatory Visit: Payer: Self-pay

## 2019-11-26 DIAGNOSIS — R072 Precordial pain: Secondary | ICD-10-CM

## 2019-11-26 DIAGNOSIS — I25118 Atherosclerotic heart disease of native coronary artery with other forms of angina pectoris: Secondary | ICD-10-CM

## 2019-11-26 DIAGNOSIS — Z01812 Encounter for preprocedural laboratory examination: Secondary | ICD-10-CM

## 2019-11-26 LAB — MYOCARDIAL PERFUSION IMAGING
LV dias vol: 148 mL (ref 62–150)
LV sys vol: 98 mL
Peak HR: 85 {beats}/min
Rest HR: 54 {beats}/min
SDS: 2
SRS: 5
SSS: 7
TID: 1.27

## 2019-11-26 MED ORDER — TECHNETIUM TC 99M TETROFOSMIN IV KIT
31.1000 | PACK | Freq: Once | INTRAVENOUS | Status: AC | PRN
  Administered 2019-11-26: 31.1 via INTRAVENOUS
  Filled 2019-11-26: qty 32

## 2019-11-26 MED ORDER — TECHNETIUM TC 99M TETROFOSMIN IV KIT
10.6000 | PACK | Freq: Once | INTRAVENOUS | Status: AC | PRN
  Administered 2019-11-26: 10.6 via INTRAVENOUS
  Filled 2019-11-26: qty 11

## 2019-11-26 MED ORDER — REGADENOSON 0.4 MG/5ML IV SOLN
0.4000 mg | Freq: Once | INTRAVENOUS | Status: AC
  Administered 2019-11-26: 0.4 mg via INTRAVENOUS

## 2019-11-26 NOTE — Telephone Encounter (Signed)
 @  [facility] [address] [place] [address] Dept: [phone] Loc: [phone]  [name]  11/26/2019  You are scheduled for a Cardiac Cath on Tuesday 12/03/19  with Dr.Varanasi.  1. Please arrive at the [facility] (Main Entrance A) at [facility]: [address] [place], [place] [address] at 5:30 am. (This time is two hours before your procedure to ensure your preparation). Free valet parking service is available.   Special note: Every effort is made to have your procedure done on time. Please understand that emergencies sometimes delay scheduled procedures.  2. Diet: Do not eat solid foods after midnight.  The patient may have clear liquids until 5am upon the day of the procedure.  3. Labs: You will need to have blood drawn bmet,cbc,pt on Thursday 11/28/19 at 10:30 am at [facility] office. You do not need to be               fasting.               Covid Test to be done Thursday 12/24 at 11:35 am at [facility] site,pre procedure line under brick awning.After covid test you will need               to quarantine until after cath.  4. Medication instructions in preparation for your procedure:     Hold Metformin morning of cath and Hold 2 days after cath.   On the morning of your procedure, take your Aspirin 81 mg and any morning medicines NOT listed above.  You may use sips of water.  5. Plan for one night stay--bring personal belongings. 6. Bring a current list of your medications and current insurance cards. 7. You MUST have a responsible person to drive you home. 8. Someone MUST be with you the first 24 hours after you arrive home or your discharge will be delayed. 9. Please wear clothes that are easy to get on and off and wear slip-on shoes.  Thank you for allowing [place] to care for you!   -- [name]

## 2019-11-27 ENCOUNTER — Other Ambulatory Visit: Payer: Self-pay | Admitting: Cardiology

## 2019-11-27 DIAGNOSIS — R9439 Abnormal result of other cardiovascular function study: Secondary | ICD-10-CM

## 2019-11-27 MED ORDER — SODIUM CHLORIDE 0.9% FLUSH: 3.0000 mL | Freq: Two times a day (BID) | INTRAVENOUS | Status: DC

## 2019-11-28 ENCOUNTER — Other Ambulatory Visit (HOSPITAL_COMMUNITY)
Admission: RE | Admit: 2019-11-28 | Discharge: 2019-11-28 | Disposition: A | Discharge status: Home / Self Care | Payer: BC Managed Care – PPO | Source: Ambulatory Visit | Attending: Interventional Cardiology | Admitting: Interventional Cardiology

## 2019-11-28 DIAGNOSIS — Z01812 Encounter for preprocedural laboratory examination: Secondary | ICD-10-CM | POA: Insufficient documentation

## 2019-11-28 DIAGNOSIS — Z20828 Contact with and (suspected) exposure to other viral communicable diseases: Secondary | ICD-10-CM | POA: Insufficient documentation

## 2019-11-28 LAB — CBC WITH DIFFERENTIAL/PLATELET
Basophils Absolute: 0 10*3/uL (ref 0.0–0.2)
Basos: 1 %
EOS (ABSOLUTE): 0.1 10*3/uL (ref 0.0–0.4)
Eos: 2 %
Hematocrit: 43.7 % (ref 37.5–51.0)
Hemoglobin: 14.9 g/dL (ref 13.0–17.7)
Immature Grans (Abs): 0 10*3/uL (ref 0.0–0.1)
Immature Granulocytes: 0 %
Lymphocytes Absolute: 1.3 10*3/uL (ref 0.7–3.1)
Lymphs: 24 %
MCH: 30.3 pg (ref 26.6–33.0)
MCHC: 34.1 g/dL (ref 31.5–35.7)
MCV: 89 fL (ref 79–97)
Monocytes Absolute: 0.4 10*3/uL (ref 0.1–0.9)
Monocytes: 8 %
Neutrophils Absolute: 3.6 10*3/uL (ref 1.4–7.0)
Neutrophils: 65 %
Platelets: 157 10*3/uL (ref 150–450)
RBC: 4.91 x10E6/uL (ref 4.14–5.80)
RDW: 12.4 % (ref 11.6–15.4)
WBC: 5.5 10*3/uL (ref 3.4–10.8)

## 2019-11-29 LAB — BASIC METABOLIC PANEL
BUN/Creatinine Ratio: 15 (ref 10–24)
BUN: 15 mg/dL (ref 8–27)
CO2: 24 mmol/L (ref 20–29)
Calcium: 9.3 mg/dL (ref 8.6–10.2)
Chloride: 102 mmol/L (ref 96–106)
Creatinine, Ser: 1.03 mg/dL (ref 0.76–1.27)
GFR calc Af Amer: 82 mL/min/{1.73_m2} (ref >59)
GFR calc non Af Amer: 71 mL/min/{1.73_m2} (ref >59)
Glucose: 136 mg/dL — ABNORMAL HIGH (ref 65–99)
Potassium: 4.3 mmol/L (ref 3.5–5.2)
Sodium: 142 mmol/L (ref 134–144)

## 2019-11-29 LAB — PT AND PTT
INR: 1.1 (ref 0.9–1.2)
Prothrombin Time: 11.5 s (ref 9.1–12.0)
aPTT: 26 s (ref 24–33)

## 2019-11-29 LAB — NOVEL CORONAVIRUS, NAA (HOSP ORDER, SEND-OUT TO REF LAB; TAT 18-24 HRS): SARS-CoV-2, NAA: NOT DETECTED

## 2019-12-02 ENCOUNTER — Telehealth: Payer: Self-pay

## 2019-12-02 NOTE — Telephone Encounter (Signed)
The following instructions were reviewed with the patient's spouse, his contact. The patient's spouse had no questions and verbalized understanding of the following instructions.  Pt contacted pre-catheterization scheduled at [facility] for:7:30 am Verified arrival time and place: [place] En[facility] A (:[facility]) at: 5:30 am   No solid food after midnight prior to cath, clear liquids until 5 AM day of procedure. Contrast allergy: No Verified no diabetes medications. Yes he is taking Metformin  Patient is instructed to hold metformin the morning of the procedure.  AM meds can be  taken pre-cath with sip of water including: ASA 81 mg  Confirmed patient has responsible adult to drive home post procedure and observe 24 hours after arriving home: Patient has a responsible party to drive him home and observe him for 24 hours after the procedure.  Currently, due to Covid-19 pandemic, only one support person will be allowed with patient. Must be the same support person for that patient's entire stay, will be screened and required to wear a mask. They will be asked to wait in the waiting room for the duration of the patient's stay.  Patients are required to wear a mask when they enter the hospital.      COVID-19 Pre-Screening Questions:  . In the past 7 to 10 days have you had a cough,  shortness of breath, headache, congestion, fever (100 or greater) body aches, chills, sore throat, or sudden loss of taste or sense of smell? No . Have you been around anyone with known Covid 19? No . Have you been around anyone who is awaiting Covid 19 test results in the past 7 to 10 days? No . Have you been around anyone who has been exposed to Covid 19, or has mentioned symptoms of Covid 19 within the past 7 to 10 days? No  If you have any concerns/questions about symptoms patients report during screening (either on the phone or at threshold). Contact the provider seeing the patient or DOD  for further guidance.  If neither are available contact a member of the leadership team.

## 2019-12-03 ENCOUNTER — Ambulatory Visit (HOSPITAL_COMMUNITY)
Admission: RE | Admit: 2019-12-03 | Discharge: 2019-12-03 | Disposition: A | Discharge status: Home / Self Care | Payer: BC Managed Care – PPO | Attending: Interventional Cardiology | Admitting: Interventional Cardiology

## 2019-12-03 ENCOUNTER — Encounter (HOSPITAL_COMMUNITY)
Admission: RE | Disposition: A | Discharge status: Home / Self Care | Payer: BC Managed Care – PPO | Source: Home / Self Care | Attending: Interventional Cardiology

## 2019-12-03 DIAGNOSIS — Z79899 Other long term (current) drug therapy: Secondary | ICD-10-CM | POA: Insufficient documentation

## 2019-12-03 DIAGNOSIS — R9439 Abnormal result of other cardiovascular function study: Secondary | ICD-10-CM | POA: Diagnosis present

## 2019-12-03 DIAGNOSIS — M1990 Unspecified osteoarthritis, unspecified site: Secondary | ICD-10-CM | POA: Insufficient documentation

## 2019-12-03 DIAGNOSIS — N400 Enlarged prostate without lower urinary tract symptoms: Secondary | ICD-10-CM | POA: Diagnosis not present

## 2019-12-03 DIAGNOSIS — Z8249 Family history of ischemic heart disease and other diseases of the circulatory system: Secondary | ICD-10-CM | POA: Insufficient documentation

## 2019-12-03 DIAGNOSIS — Z888 Allergy status to other drugs, medicaments and biological substances status: Secondary | ICD-10-CM | POA: Insufficient documentation

## 2019-12-03 DIAGNOSIS — E785 Hyperlipidemia, unspecified: Secondary | ICD-10-CM | POA: Diagnosis not present

## 2019-12-03 DIAGNOSIS — K219 Gastro-esophageal reflux disease without esophagitis: Secondary | ICD-10-CM | POA: Diagnosis not present

## 2019-12-03 DIAGNOSIS — Z955 Presence of coronary angioplasty implant and graft: Secondary | ICD-10-CM | POA: Insufficient documentation

## 2019-12-03 DIAGNOSIS — I10 Essential (primary) hypertension: Secondary | ICD-10-CM | POA: Insufficient documentation

## 2019-12-03 DIAGNOSIS — Z7984 Long term (current) use of oral hypoglycemic drugs: Secondary | ICD-10-CM | POA: Diagnosis not present

## 2019-12-03 DIAGNOSIS — E119 Type 2 diabetes mellitus without complications: Secondary | ICD-10-CM | POA: Insufficient documentation

## 2019-12-03 DIAGNOSIS — I480 Paroxysmal atrial fibrillation: Secondary | ICD-10-CM | POA: Diagnosis not present

## 2019-12-03 DIAGNOSIS — I2510 Atherosclerotic heart disease of native coronary artery without angina pectoris: Secondary | ICD-10-CM | POA: Insufficient documentation

## 2019-12-03 DIAGNOSIS — Z885 Allergy status to narcotic agent status: Secondary | ICD-10-CM | POA: Diagnosis not present

## 2019-12-03 DIAGNOSIS — Z87891 Personal history of nicotine dependence: Secondary | ICD-10-CM | POA: Diagnosis not present

## 2019-12-03 DIAGNOSIS — Z7901 Long term (current) use of anticoagulants: Secondary | ICD-10-CM | POA: Insufficient documentation

## 2019-12-03 HISTORY — PX: LEFT HEART CATH AND CORONARY ANGIOGRAPHY: CATH118249

## 2019-12-03 LAB — GLUCOSE, CAPILLARY: Glucose-Capillary: 134 mg/dL — ABNORMAL HIGH (ref 70–99)

## 2019-12-03 SURGERY — LEFT HEART CATH AND CORONARY ANGIOGRAPHY
Anesthesia: LOCAL

## 2019-12-03 MED ORDER — HYDRALAZINE HCL 20 MG/ML IJ SOLN: 10.0000 mg | INTRAMUSCULAR | Status: DC | PRN

## 2019-12-03 MED ORDER — HEPARIN SODIUM (PORCINE) 1000 UNIT/ML IJ SOLN
INTRAMUSCULAR | Status: DC | PRN
  Administered 2019-12-03: 3500 [IU] via INTRAVENOUS

## 2019-12-03 MED ORDER — SODIUM CHLORIDE 0.9 % WEIGHT BASED INFUSION
3.0000 mL/kg/h | INTRAVENOUS | Status: AC
  Administered 2019-12-03: 3 mL/kg/h via INTRAVENOUS

## 2019-12-03 MED ORDER — HEPARIN (PORCINE) IN NACL 1000-0.9 UT/500ML-% IV SOLN
INTRAVENOUS | Status: AC
  Filled 2019-12-03: qty 1000

## 2019-12-03 MED ORDER — LIDOCAINE HCL (PF) 1 % IJ SOLN
INTRAMUSCULAR | Status: DC | PRN
  Administered 2019-12-03: 2 mL via INTRADERMAL

## 2019-12-03 MED ORDER — VERAPAMIL HCL 2.5 MG/ML IV SOLN
INTRAVENOUS | Status: DC | PRN
  Administered 2019-12-03: 08:00:00 10 mL via INTRA_ARTERIAL

## 2019-12-03 MED ORDER — SODIUM CHLORIDE 0.9% FLUSH: 3.0000 mL | INTRAVENOUS | Status: DC | PRN

## 2019-12-03 MED ORDER — SODIUM CHLORIDE 0.9 % WEIGHT BASED INFUSION: 1.0000 mL/kg/h | INTRAVENOUS | Status: DC

## 2019-12-03 MED ORDER — ASPIRIN 81 MG PO CHEW: 81.0000 mg | CHEWABLE_TABLET | ORAL | Status: DC

## 2019-12-03 MED ORDER — MIDAZOLAM HCL 2 MG/2ML IJ SOLN
INTRAMUSCULAR | Status: AC
  Filled 2019-12-03: qty 2

## 2019-12-03 MED ORDER — LABETALOL HCL 5 MG/ML IV SOLN: 10.0000 mg | INTRAVENOUS | Status: DC | PRN

## 2019-12-03 MED ORDER — SODIUM CHLORIDE 0.9 % IV SOLN: INTRAVENOUS | Status: AC

## 2019-12-03 MED ORDER — SODIUM CHLORIDE 0.9 % IV SOLN: 250.0000 mL | INTRAVENOUS | Status: DC | PRN

## 2019-12-03 MED ORDER — FENTANYL CITRATE (PF) 100 MCG/2ML IJ SOLN
INTRAMUSCULAR | Status: DC | PRN
  Administered 2019-12-03: 25 ug via INTRAVENOUS

## 2019-12-03 MED ORDER — SODIUM CHLORIDE 0.9% FLUSH: 3.0000 mL | Freq: Two times a day (BID) | INTRAVENOUS | Status: DC

## 2019-12-03 MED ORDER — ACETAMINOPHEN 325 MG PO TABS: 650.0000 mg | ORAL_TABLET | ORAL | Status: DC | PRN

## 2019-12-03 MED ORDER — ONDANSETRON HCL 4 MG/2ML IJ SOLN: 4.0000 mg | Freq: Four times a day (QID) | INTRAMUSCULAR | Status: DC | PRN

## 2019-12-03 MED ORDER — VERAPAMIL HCL 2.5 MG/ML IV SOLN
INTRAVENOUS | Status: AC
  Filled 2019-12-03: qty 2

## 2019-12-03 MED ORDER — LIDOCAINE HCL (PF) 1 % IJ SOLN
INTRAMUSCULAR | Status: AC
  Filled 2019-12-03: qty 30

## 2019-12-03 MED ORDER — HEPARIN SODIUM (PORCINE) 1000 UNIT/ML IJ SOLN
INTRAMUSCULAR | Status: AC
  Filled 2019-12-03: qty 1

## 2019-12-03 MED ORDER — HEPARIN (PORCINE) IN NACL 1000-0.9 UT/500ML-% IV SOLN
INTRAVENOUS | Status: DC | PRN
  Administered 2019-12-03 (×2): 500 mL

## 2019-12-03 MED ORDER — IOHEXOL 350 MG/ML SOLN
INTRAVENOUS | Status: DC | PRN
  Administered 2019-12-03: 45 mL via INTRACARDIAC

## 2019-12-03 MED ORDER — MIDAZOLAM HCL 2 MG/2ML IJ SOLN
INTRAMUSCULAR | Status: DC | PRN
  Administered 2019-12-03: 2 mg via INTRAVENOUS

## 2019-12-03 MED ORDER — METFORMIN HCL 500 MG PO TABS: 500.0000 mg | ORAL_TABLET | Freq: Two times a day (BID) | ORAL | Status: DC

## 2019-12-03 MED ORDER — FENTANYL CITRATE (PF) 100 MCG/2ML IJ SOLN
INTRAMUSCULAR | Status: AC
  Filled 2019-12-03: qty 2

## 2019-12-03 SURGICAL SUPPLY — 12 items
CATH 5FR JL3.5 JR4 ANG PIG MP (CATHETERS) ×2 IMPLANT
DEVICE RAD COMP TR BAND LRG (VASCULAR PRODUCTS) ×1 IMPLANT
GLIDESHEATH SLEND SS 6F .021 (SHEATH) ×1 IMPLANT
GUIDEWIRE INQWIRE 1.5J.035X260 (WIRE) IMPLANT
INQWIRE 1.5J .035X260CM (WIRE) ×2
KIT HEART LEFT (KITS) ×2 IMPLANT
PACK CARDIAC CATHETERIZATION (CUSTOM PROCEDURE TRAY) ×2 IMPLANT
PROTECTION STATION PRESSURIZED (MISCELLANEOUS) ×2
STATION PROTECTION PRESSURIZED (MISCELLANEOUS) IMPLANT
TRANSDUCER W/STOPCOCK (MISCELLANEOUS) ×2 IMPLANT
TUBING CIL FLEX 10 FLL-RA (TUBING) ×2 IMPLANT
VALVE MANIFOLD 3 PORT W/RA/ON (MISCELLANEOUS) ×1 IMPLANT

## 2019-12-03 NOTE — Interval H&P Note (Signed)
Cath Lab Visit (complete for each Cath Lab visit)  Clinical Evaluation Leading to the Procedure:   ACS: No.  Non-ACS:    Anginal Classification: CCS II  Anti-ischemic medical therapy: Minimal Therapy (1 class of medications)  Non-Invasive Test Results: Intermediate-risk stress test findings: cardiac mortality 1-3%/year  Prior CABG: No previous CABG      History and Physical Interval Note:  12/03/2019 7:38 AM  [name]  has presented today for surgery, with the diagnosis of abnormal myoview.  The various methods of treatment have been discussed with the patient and family. After consideration of risks, benefits and other options for treatment, the patient has consented to  Procedure(s): LEFT HEART CATH AND CORONARY ANGIOGRAPHY (N/A) as a surgical intervention.  The patient's history has been reviewed, patient examined, no change in status, stable for surgery.  I have reviewed the patient's chart and labs.  Questions were answered to the patient's satisfaction.     [name]

## 2019-12-03 NOTE — Discharge Instructions (Signed)
Radial Site Care ° °This sheet gives you information about how to care for yourself after your procedure. Your health care provider may also give you more specific instructions. If you have problems or questions, contact your health care provider. °What can I expect after the procedure? °After the procedure, it is common to have: °· Bruising and tenderness at the catheter insertion area. °Follow these instructions at home: °Medicines °· Take over-the-counter and prescription medicines only as told by your health care provider. °Insertion site care °· Follow instructions from your health care provider about how to take care of your insertion site. Make sure you: °? Wash your hands with soap and water before you change your bandage (dressing). If soap and water are not available, use hand sanitizer. °? Change your dressing as told by your health care provider. °? Leave stitches (sutures), skin glue, or adhesive strips in place. These skin closures may need to stay in place for 2 weeks or longer. If adhesive strip edges start to loosen and curl up, you may trim the loose edges. Do not remove adhesive strips completely unless your health care provider tells you to do that. °· Check your insertion site every day for signs of infection. Check for: °? Redness, swelling, or pain. °? Fluid or blood. °? Pus or a bad smell. °? Warmth. °· Do not take baths, swim, or use a hot tub until your health care provider approves. °· You may shower 24-48 hours after the procedure, or as directed by your health care provider. °? Remove the dressing and gently wash the site with plain soap and water. °? Pat the area dry with a clean towel. °? Do not rub the site. That could cause bleeding. °· Do not apply powder or lotion to the site. °Activity ° °· For 24 hours after the procedure, or as directed by your health care provider: °? Do not flex or bend the affected arm. °? Do not push or pull heavy objects with the affected arm. °? Do not  drive yourself home from the hospital or clinic. You may drive 24 hours after the procedure unless your health care provider tells you not to. °? Do not operate machinery or power tools. °· Do not lift anything that is heavier than 10 lb (4.5 kg), or the limit that you are told, until your health care provider says that it is safe. °· Ask your health care provider when it is okay to: °? Return to work or school. °? Resume usual physical activities or sports. °? Resume sexual activity. °General instructions °· If the catheter site starts to bleed, raise your arm and put firm pressure on the site. If the bleeding does not stop, get help right away. This is a medical emergency. °· If you went home on the same day as your procedure, a responsible adult should be with you for the first 24 hours after you arrive home. °· Keep all follow-up visits as told by your health care provider. This is important. °Contact a health care provider if: °· You have a fever. °· You have redness, swelling, or yellow drainage around your insertion site. °Get help right away if: °· You have unusual pain at the radial site. °· The catheter insertion area swells very fast. °· The insertion area is bleeding, and the bleeding does not stop when you hold steady pressure on the area. °· Your arm or hand becomes pale, cool, tingly, or numb. °These symptoms may represent a serious problem   that is an emergency. Do not wait to see if the symptoms will go away. Get medical help right away. Call your local emergency services (911 in the [place] Do not drive yourself to the hospital. °Summary °· After the procedure, it is common to have bruising and tenderness at the site. °· Follow instructions from your health care provider about how to take care of your radial site wound. Check the wound every day for signs of infection. °· Do not lift anything that is heavier than 10 lb (4.5 kg), or the limit that you are told, until your health care provider says  that it is safe. °This information is not intended to replace advice given to you by your health care provider. Make sure you discuss any questions you have with your health care provider. °Document Released: 12/24/2010 Document Revised: 12/27/2017 Document Reviewed: 12/27/2017 °Elsevier Patient Education © 2020 [employer] ° °

## 2019-12-03 NOTE — Progress Notes (Signed)
Discharge instructions reviewed with patient and family. Verbalized understanding. 

## 2019-12-04 ENCOUNTER — Other Ambulatory Visit: Payer: Self-pay | Admitting: Cardiothoracic Surgery

## 2019-12-04 ENCOUNTER — Institutional Professional Consult (permissible substitution): Payer: BC Managed Care – PPO | Admitting: Cardiothoracic Surgery

## 2019-12-04 ENCOUNTER — Other Ambulatory Visit: Payer: Self-pay

## 2019-12-04 VITALS — BP 180/88 | HR 64 | Temp 97.7°F | Resp 20 | Ht 71.0 in | Wt 145.0 lb

## 2019-12-04 DIAGNOSIS — I2510 Atherosclerotic heart disease of native coronary artery without angina pectoris: Secondary | ICD-10-CM

## 2019-12-04 DIAGNOSIS — I200 Unstable angina: Secondary | ICD-10-CM | POA: Diagnosis not present

## 2019-12-04 MED ORDER — METOPROLOL SUCCINATE ER 25 MG PO TB24: 25.0000 mg | ORAL_TABLET | Freq: Every day | ORAL | 1 refills | Status: DC

## 2019-12-04 MED ORDER — NITROGLYCERIN ER 2.5 MG PO CPCR: 2.5000 mg | ORAL_CAPSULE | Freq: Two times a day (BID) | ORAL | 1 refills | Status: DC

## 2019-12-04 NOTE — Progress Notes (Signed)
PCP is Marton Redwood, MD Referring Provider is Marton Redwood, MD  No chief complaint on file.  Patient examined, images of coronary arteriograms performed yesterday personally reviewed and discussed with patient and wife.  HPI: 73 year old diabetic with positive family history of CAD/CABG presents with recent diagnosis by cath of severe three-vessel coronary artery disease with mild-moderate LV dysfunction.  The patient has been having symptoms of upper back pain with exertion as well as generalized easy fatigue and decreased exercise tolerance over the past 30 days.  The patient was seen by his cardiologist Dr. Martinique and a stress test was performed.  This was abnormal with significant ischemic changes in the inferior wall and apex.  Subsequent cardiac catheterization performed yesterday demonstrated proximal 80% RCA stenosis with 2 stents in the mid vessel, proximal 80% stenosis of the LAD and diagonal, and proximal 80% to 90% stenosis of the circumflex OM1.  LVEF 45% with LVEDP 15. Patient presents today for valuation of CABG.  Past Medical History:  Diagnosis Date  . Arthritis   . Arthritis   . Atrial fibrillation ([place])   . BPH (benign prostatic hypertrophy)   . CAD (coronary artery disease)    stents placed  . Diabetes mellitus   . Diverticulosis   . Dysrhythmia    "extra beats"  . Esophageal stricture   . GERD (gastroesophageal reflux disease)   . Headache(784.0)   . Hemorrhoids   . Hernia, inguinal, right   . Hiatal hernia   . Hyperlipidemia   . Hypertension   . Staph infection    Left ankle  . 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 Urinary hesitancy     Past Surgical History:  Procedure Laterality Date  . ANKLE ARTHROPLASTY  2006  . CARDIOVASCULAR STRESS TEST  01/22/2009   EF 51%, NO ISCHEMIA  . CARDIOVASCULAR STRESS TEST    . COLONOSCOPY    . CORONARY ANGIOPLASTY WITH STENT PLACEMENT  03/2005  . CORONARY STENT PLACEMENT  03/10/2005   SUCCESSFUL STENTING OF THE MID  AND CRUX OF THE RCA  . ESOPHAGEAL DILATION     multiple procedures  . HERNIA REPAIR  [id number]   umbilical hernia  . INGUINAL HERNIA REPAIR  11/28/2011   Procedure: HERNIA REPAIR INGUINAL ADULT;  Surgeon: Imogene Burn. Georgette Dover, MD;  Location: [facility];  Service: General;  Laterality: Right;  right inguinal hernia repair with mesh  . KNEE SURGERY     Right  . LEFT HEART CATH AND CORONARY ANGIOGRAPHY N/A 12/03/2019   Procedure: LEFT HEART CATH AND CORONARY ANGIOGRAPHY;  Surgeon: Jettie Booze, MD;  Location: [facility];  Service: Cardiovascular;  Laterality: N/A;  . POLYPECTOMY    . TONSILLECTOMY  1958  . UMBILICAL HERNIA REPAIR  11/2006  . US ECHOCARDIOGRAPHY      Family History  Problem Relation Age of Onset  . Heart disease Mother   . Heart disease Sister   . Heart disease Brother   . Heart disease Maternal Grandfather   . Ovarian cancer Paternal Grandmother   . Cancer Paternal Grandmother        cervical  . Colon cancer Neg Hx   . Esophageal cancer Neg Hx   . Stomach cancer Neg Hx     Social History Social History   Tobacco Use  . Smoking status: Former Research scientist (life sciences)  . Smokeless tobacco: Never Used  Substance Use Topics  . Alcohol use: No    Alcohol/week: 0.0 standard drinks  . Drug use: No  Current Outpatient Medications  Medication Sig Dispense Refill  . acetaminophen (TYLENOL) 650 MG CR tablet Take 1,300 mg by mouth every 8 (eight) hours as needed for pain.    Marland Kitchen amoxicillin (AMOXIL) 500 MG capsule Take 500 mg by mouth 3 (three) times daily.    . APPLE CIDER VINEGAR PO Take 2 capsules by mouth daily.    Marland Kitchen aspirin EC 81 MG tablet Take 1 tablet (81 mg total) by mouth daily.    . B Complex-C (B-COMPLEX WITH VITAMIN C) tablet Take 3 tablets by mouth daily with breakfast.    . cetirizine (ZYRTEC) 10 MG tablet Take 10 mg by mouth daily.      . Coenzyme Q10 (CO Q-10) 200 MG CAPS Take 200 mg by mouth daily after supper.     . fluticasone (FLONASE) 50 MCG/ACT nasal  spray Place 2 sprays into both nostrils daily as needed for allergies.     . Garlic [id number] MG TABS Take 500 mg by mouth daily with supper.    . hydrocortisone (ANUSOL-HC) 2.5 % rectal cream Place 1 application rectally 2 (two) times daily.    . lansoprazole (PREVACID) 15 MG capsule Take 15 mg by mouth daily.      Marland Kitchen lisinopril (PRINIVIL,ZESTRIL) 20 MG tablet Take 40 mg by mouth daily.     Derrill Memo ON 12/05/2019] metFORMIN (GLUCOPHAGE) 500 MG tablet Take 1 tablet (500 mg total) by mouth 2 (two) times daily with a meal.    . metoprolol succinate (TOPROL-XL) 25 MG 24 hr tablet TAKE 1 AND 1/2 TABLETS EVERY DAY (Patient taking differently: Take 25 mg by mouth daily. Takes only 25 mg daily) 45 tablet 0  . montelukast (SINGULAIR) 10 MG tablet Take 10 mg by mouth at bedtime.    . Multiple Minerals-Vitamins (CALCIUM-MAGNESIUM-ZINC-D3) TABS Take 3 tablets by mouth daily with lunch.    . olopatadine (PATANOL) 0.1 % ophthalmic solution Place 1 drop into both eyes 2 (two) times daily as needed for allergies.    . Omega-3 Fatty Acids (FISH OIL PO) Take 1 capsule by mouth daily.    . Tamsulosin HCl (FLOMAX) 0.4 MG CAPS Take 0.4 mg by mouth daily.      . Turmeric 500 MG CAPS Take 500 mg by mouth daily.     Current Facility-Administered Medications  Medication Dose Route Frequency Provider Last Rate Last Admin  . sodium chloride flush (NS) 0.9 % injection 3 mL  3 mL Intravenous Q12H Martinique, Lorine Iannaccone M, MD        Allergies  Allergen Reactions  . Statins Anaphylaxis    Myalgias   . Bismuth Subsalicylate Nausea And Vomiting  . Codeine Nausea And Vomiting  . Morphine And Related Nausea And Vomiting  . Ramipril Rash    Review of Systems   Positive family history of CAD-CABG in his mother Noninsulin-dependent type 2 diabetes last A1c 7.1 Tolerated general anesthesia for recent right inguinal hernia and umbilical hernia repair Patient retired and lives with his wife works at the [employer]  [employer] remotely No previous thoracic trauma Previous dislocation of right ankle which was reduced with closed manipulation Significant worsening and short-term memory, no discrete episode of stroke Weight has been stable No productive cough fever headache shortness of breath loss of taste smell consistent with Covid infection  BP (!) 180/88   Pulse 64   Temp 97.7 F (36.5 C) (Skin)   Resp 20   Ht 5\' 11"  (1.803 m)   Wt 145  lb (65.8 kg)   SpO2 95% Comment: RA  BMI 20.22 kg/m  Physical Exam      Exam    General- alert and comfortable    Neck- no JVD, no cervical adenopathy palpable, no carotid bruit   Lungs- clear without rales, wheezes   Cor- regular rate and rhythm, no murmur , gallop   Abdomen- soft, non-tender   Extremities - warm, non-tender, minimal edema   Neuro- oriented, appropriate, no focal weakness Skin-area of recent Mohs surgery on the dorsum of the right hand healing well  Diagnostic Tests: Coronary xerograms reviewed personally and discussed with patient and wife.  Severe three-vessel coronary disease with mild-moderate LV dysfunction Impression: Patient would benefit from multivessel CABG with bypass graft to the LAD diagonal OM1 and posterior descending.  Patient will stop taking fish oil tablets. He had recent significant jaw swelling and was treated with oral antibiotics by his dentist-took his last antibiotic today.  Patient understands that because of the Covid pandemic hospital beds and especially ICU rooms are an issue.  However his coronary anatomy and correlating symptoms are very concerning for accelerating angina and I feel that we need to proceed with scheduling surgery to prevent progression to myocardial infarction.  We will get a note from his dentist to make sure that he has no active dental infection-abscess, obtain head CT scan because of his recent significant change in short-term memory-last CT scan was 3 years ago which showed evidence of  microvascular changes and atrophy. Plan: Return after CT scan to set a date of surgery.  Len Childs, MD [facility] [phone]

## 2019-12-04 NOTE — Addendum Note (Signed)
Addended by: Floyde Parkins MD, Rhilynn Preyer on: 12/04/2019 02:05 PM   Modules accepted: Orders

## 2019-12-10 ENCOUNTER — Telehealth: Payer: Self-pay

## 2019-12-10 DIAGNOSIS — I25119 Atherosclerotic heart disease of native coronary artery with unspecified angina pectoris: Secondary | ICD-10-CM

## 2019-12-10 MED ORDER — NITROGLYCERIN 0.4 MG SL SUBL: 0.4000 mg | SUBLINGUAL_TABLET | SUBLINGUAL | 1 refills | Status: DC | PRN

## 2019-12-10 NOTE — Telephone Encounter (Signed)
-----   Message from Ivin Poot, MD sent at 12/10/2019  9:46 AM EST ----- Regarding: RE: face swelling and NTG RX Contact: [phone] The NTG option from his pharmacy was only capsules- if you can change it to sublingual form please do so. Otherwise no change. ----- Message ----- From: Marylen Ponto, LPN Sent: [id number]  [id number] AM EST To: Ivin Poot, MD Subject: face swelling and NTG RX                       [name] was give a new RX last week for NTG. to  take PRN for chest pain. The RX sent to the pharm was capsules.  Did you  want him to have capsules for sublingual strips?  ALSO he did see his Dentist about teeth/gums. There was no abscess and the dentist will fax over this report when completed. He did develop face swelling again over the weekend but this time on the left side of his face. He denies and SOB but does have some itching on his waist area. He took a benadryl  and Zyrtec, this helped with the itching but not the swelling. The only new medication is the Metoprolol XL/ succ 25 mg po daily. Stated this back on Dec 3rd or 4th/ He was taking the Metoprolol tartrate but was changed to the Succinate. No other changes in medications. He is going to contact his Allergist today-Dr Donneta Romberg for possible appt. Please advise  thanks Linden Dolin

## 2019-12-15 ENCOUNTER — Encounter (HOSPITAL_COMMUNITY)
Admission: EM | Disposition: A | Discharge status: Home / Self Care | Payer: Self-pay | Source: Home / Self Care | Attending: Emergency Medicine

## 2019-12-15 ENCOUNTER — Emergency Department (HOSPITAL_COMMUNITY)
Admission: EM | Admit: 2019-12-15 | Discharge: 2019-12-15 | Disposition: A | Discharge status: Home / Self Care | Payer: BC Managed Care – PPO | Attending: Emergency Medicine | Admitting: Emergency Medicine

## 2019-12-15 ENCOUNTER — Other Ambulatory Visit: Payer: Self-pay

## 2019-12-15 DIAGNOSIS — R42 Dizziness and giddiness: Secondary | ICD-10-CM | POA: Diagnosis not present

## 2019-12-15 DIAGNOSIS — E119 Type 2 diabetes mellitus without complications: Secondary | ICD-10-CM | POA: Insufficient documentation

## 2019-12-15 DIAGNOSIS — R079 Chest pain, unspecified: Secondary | ICD-10-CM | POA: Insufficient documentation

## 2019-12-15 DIAGNOSIS — Z955 Presence of coronary angioplasty implant and graft: Secondary | ICD-10-CM | POA: Insufficient documentation

## 2019-12-15 DIAGNOSIS — I25119 Atherosclerotic heart disease of native coronary artery with unspecified angina pectoris: Secondary | ICD-10-CM | POA: Diagnosis not present

## 2019-12-15 DIAGNOSIS — Z20822 Contact with and (suspected) exposure to covid-19: Secondary | ICD-10-CM | POA: Diagnosis not present

## 2019-12-15 DIAGNOSIS — Z87891 Personal history of nicotine dependence: Secondary | ICD-10-CM | POA: Insufficient documentation

## 2019-12-15 DIAGNOSIS — I10 Essential (primary) hypertension: Secondary | ICD-10-CM | POA: Insufficient documentation

## 2019-12-15 LAB — COMPREHENSIVE METABOLIC PANEL
ALT: 19 U/L (ref 0–44)
AST: 20 U/L (ref 15–41)
Albumin: 3.9 g/dL (ref 3.5–5.0)
Alkaline Phosphatase: 59 U/L (ref 38–126)
Anion gap: 12 (ref 5–15)
BUN: 15 mg/dL (ref 8–23)
CO2: 26 mmol/L (ref 22–32)
Calcium: 9.3 mg/dL (ref 8.9–10.3)
Chloride: 98 mmol/L (ref 98–111)
Creatinine, Ser: 1.13 mg/dL (ref 0.61–1.24)
GFR calc Af Amer: >60 mL/min (ref >60)
GFR calc non Af Amer: >60 mL/min (ref >60)
Glucose, Bld: 227 mg/dL — ABNORMAL HIGH (ref 70–99)
Potassium: 4 mmol/L (ref 3.5–5.1)
Sodium: 136 mmol/L (ref 135–145)
Total Bilirubin: 0.7 mg/dL (ref 0.3–1.2)
Total Protein: 6.3 g/dL — ABNORMAL LOW (ref 6.5–8.1)

## 2019-12-15 LAB — PROTIME-INR
INR: 1.1 (ref 0.8–1.2)
Prothrombin Time: 14 seconds (ref 11.4–15.2)

## 2019-12-15 LAB — LIPID PANEL
Cholesterol: 234 mg/dL — ABNORMAL HIGH (ref 0–200)
HDL: 49 mg/dL (ref >40)
LDL Cholesterol: 149 mg/dL — ABNORMAL HIGH (ref 0–99)
Total CHOL/HDL Ratio: 4.8 RATIO
Triglycerides: 182 mg/dL — ABNORMAL HIGH (ref <150)
VLDL: 36 mg/dL (ref 0–40)

## 2019-12-15 LAB — CBC WITH DIFFERENTIAL/PLATELET
Abs Immature Granulocytes: 0.03 10*3/uL (ref 0.00–0.07)
Basophils Absolute: 0 10*3/uL (ref 0.0–0.1)
Basophils Relative: 1 %
Eosinophils Absolute: 0.1 10*3/uL (ref 0.0–0.5)
Eosinophils Relative: 1 %
HCT: 41.8 % (ref 39.0–52.0)
Hemoglobin: 14 g/dL (ref 13.0–17.0)
Immature Granulocytes: 0 %
Lymphocytes Relative: 17 %
Lymphs Abs: 1.2 10*3/uL (ref 0.7–4.0)
MCH: 30.3 pg (ref 26.0–34.0)
MCHC: 33.5 g/dL (ref 30.0–36.0)
MCV: 90.5 fL (ref 80.0–100.0)
Monocytes Absolute: 0.5 10*3/uL (ref 0.1–1.0)
Monocytes Relative: 7 %
Neutro Abs: 5.1 10*3/uL (ref 1.7–7.7)
Neutrophils Relative %: 74 %
Platelets: 171 10*3/uL (ref 150–400)
RBC: 4.62 MIL/uL (ref 4.22–5.81)
RDW: 12.1 % (ref 11.5–15.5)
WBC: 6.9 10*3/uL (ref 4.0–10.5)
nRBC: 0 % (ref 0.0–0.2)

## 2019-12-15 LAB — RESPIRATORY PANEL BY RT PCR (FLU A&B, COVID)
Influenza A by PCR: NEGATIVE
Influenza B by PCR: NEGATIVE
SARS Coronavirus 2 by RT PCR: NEGATIVE

## 2019-12-15 LAB — TROPONIN I (HIGH SENSITIVITY)
Troponin I (High Sensitivity): 4 ng/L (ref <18)
Troponin I (High Sensitivity): 4 ng/L (ref <18)

## 2019-12-15 LAB — APTT: aPTT: 26 seconds (ref 24–36)

## 2019-12-15 SURGERY — CORONARY/GRAFT ACUTE MI REVASCULARIZATION
Anesthesia: LOCAL

## 2019-12-15 MED ORDER — SODIUM CHLORIDE 0.9 % IV SOLN: INTRAVENOUS | Status: DC

## 2019-12-15 MED ORDER — ASPIRIN 81 MG PO CHEW: 324.0000 mg | CHEWABLE_TABLET | Freq: Once | ORAL | Status: AC

## 2019-12-15 MED ORDER — ASPIRIN 81 MG PO CHEW
CHEWABLE_TABLET | ORAL | Status: AC
  Administered 2019-12-15: 20:00:00 324 mg via ORAL
  Filled 2019-12-15: qty 4

## 2019-12-15 MED ORDER — HEPARIN SODIUM (PORCINE) 5000 UNIT/ML IJ SOLN: 60.0000 [IU]/kg | Freq: Once | INTRAMUSCULAR | Status: DC

## 2019-12-15 NOTE — ED Provider Notes (Signed)
[facility] Emergency Department Provider Note MRN:  [id number]  Arrival date & time: 12/15/19     Chief Complaint   Chest Pain   History of Present Illness   [name] is a 74 y.o. year-old male with a history of CAD, hypertension presenting to the ED with chief complaint of chest pain.  Sudden onset chest pain with lightheadedness occurring at about 6 PM.  Pain was moderate to severe, lasting a few moments and then resolving.  Denies shortness of breath, denies headache or vision change, no diaphoresis, no nausea, no vomiting, no diarrhea, no abdominal pain.  Patient currently pain-free and feels well.  Review of Systems  A complete 10 system review of systems was obtained and all systems are negative except as noted in the HPI and PMH.   Patient's Health History    Past Medical History:  Diagnosis Date  . Arthritis   . Arthritis   . Atrial fibrillation ([place])   . BPH (benign prostatic hypertrophy)   . CAD (coronary artery disease)    stents placed  . Diabetes mellitus   . Diverticulosis   . Dysrhythmia    "extra beats"  . Esophageal stricture   . GERD (gastroesophageal reflux disease)   . Headache(784.0)   . Hemorrhoids   . Hernia, inguinal, right   . Hiatal hernia   . Hyperlipidemia   . Hypertension   . Staph infection    Left ankle  . 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 Urinary hesitancy     Past Surgical History:  Procedure Laterality Date  . ANKLE ARTHROPLASTY  2006  . CARDIOVASCULAR STRESS TEST  01/22/2009   EF 51%, NO ISCHEMIA  . CARDIOVASCULAR STRESS TEST    . COLONOSCOPY    . CORONARY ANGIOPLASTY WITH STENT PLACEMENT  03/2005  . CORONARY STENT PLACEMENT  03/10/2005   SUCCESSFUL STENTING OF THE MID AND CRUX OF THE RCA  . ESOPHAGEAL DILATION     multiple procedures  . HERNIA REPAIR  [id number]   umbilical hernia  . INGUINAL HERNIA REPAIR  11/28/2011   Procedure: HERNIA REPAIR INGUINAL ADULT;  Surgeon: Imogene Burn.  Georgette Dover, MD;  Location: [facility];  Service: General;  Laterality: Right;  right inguinal hernia repair with mesh  . KNEE SURGERY     Right  . LEFT HEART CATH AND CORONARY ANGIOGRAPHY N/A 12/03/2019   Procedure: LEFT HEART CATH AND CORONARY ANGIOGRAPHY;  Surgeon: Jettie Booze, MD;  Location: [facility];  Service: Cardiovascular;  Laterality: N/A;  . POLYPECTOMY    . TONSILLECTOMY  1958  . UMBILICAL HERNIA REPAIR  11/2006  . US ECHOCARDIOGRAPHY      Family History  Problem Relation Age of Onset  . Heart disease Mother   . Heart disease Sister   . Heart disease Brother   . Heart disease Maternal Grandfather   . Ovarian cancer Paternal Grandmother   . Cancer Paternal Grandmother        cervical  . Colon cancer Neg Hx   . Esophageal cancer Neg Hx   . Stomach cancer Neg Hx     Social History   Socioeconomic History  . Marital status: Married    Spouse name: Not on file  . Number of children: 3  . Years of education: Not on file  . Highest education level: Not on file  Occupational History  . Occupation: Retired  Tobacco Use  . Smoking status: Former Research scientist (life sciences)  . Smokeless tobacco:  Never Used  Substance and Sexual Activity  . Alcohol use: No    Alcohol/week: 0.0 standard drinks  . Drug use: No  . Sexual activity: Not on file  Other Topics Concern  . Not on file  Social History Narrative  . Not on file   Social Determinants of Health   Financial Resource Strain:   . Difficulty of Paying Living Expenses: Not on file  Food Insecurity:   . Worried About Charity fundraiser in the Last Year: Not on file  . Ran Out of Food in the Last Year: Not on file  Transportation Needs:   . Lack of Transportation (Medical): Not on file  . Lack of Transportation (Non-Medical): Not on file  Physical Activity:   . Days of Exercise per Week: Not on file  . Minutes of Exercise per Session: Not on file  Stress:   . Feeling of Stress : Not on file  Social Connections:   .  Frequency of Communication with Friends and Family: Not on file  . Frequency of Social Gatherings with Friends and Family: Not on file  . Attends Religious Services: Not on file  . Active Member of Clubs or Organizations: Not on file  . Attends Archivist Meetings: Not on file  . Marital Status: Not on file  Intimate Partner Violence:   . Fear of Current or Ex-Partner: Not on file  . Emotionally Abused: Not on file  . Physically Abused: Not on file  . Sexually Abused: Not on file     Physical Exam  Vital Signs and Nursing Notes reviewed Vitals:   12/15/19 2000 12/15/19 2100  BP: 137/69 (!) 144/76  Pulse: 70 62  Resp: 16 19  Temp:    SpO2: 96% 99%    CONSTITUTIONAL: Well-appearing, NAD NEURO:  Alert and oriented x 3, no focal deficits EYES:  eyes equal and reactive ENT/NECK:  no LAD, no JVD CARDIO: Regular rate, well-perfused, normal S1 and S2 PULM:  CTAB no wheezing or rhonchi GI/GU:  normal bowel sounds, non-distended, non-tender MSK/SPINE:  No gross deformities, no edema SKIN:  no rash, atraumatic PSYCH:  Appropriate speech and behavior  Diagnostic and Interventional Summary    EKG Interpretation  Date/Time:  Sunday December 15 2019 18:46:28 EST Ventricular Rate:  73 PR Interval:  168 QRS Duration: 114 QT Interval:  376 QTC Calculation: 414 R Axis:   -47 Text Interpretation: Normal sinus rhythm hyperacute t waves ste lead v2 Confirmed by Gerlene Fee [id number]) on 12/15/2019 7:20:56 PM      Labs Reviewed  COMPREHENSIVE METABOLIC PANEL - Abnormal; Notable for the following components:      Result Value   Glucose, Bld 227 (*)    Total Protein 6.3 (*)    All other components within normal limits  LIPID PANEL - Abnormal; Notable for the following components:   Cholesterol 234 (*)    Triglycerides 182 (*)    LDL Cholesterol 149 (*)    All other components within normal limits  RESPIRATORY PANEL BY RT PCR (FLU A&B, COVID)  CBC WITH DIFFERENTIAL/PLATELET    PROTIME-INR  APTT  TROPONIN I (HIGH SENSITIVITY)  TROPONIN I (HIGH SENSITIVITY)    No orders to display    Medications  0.9 %  sodium chloride infusion ( Intravenous Stopped 12/15/19 2149)  aspirin chewable tablet 324 mg (324 mg Oral Given 12/15/19 2015)     Procedures  /  Critical Care .Critical Care Performed by: Maudie Flakes, MD  Authorized by: Maudie Flakes, MD   Critical care provider statement:    Critical care time (minutes):  45   Critical care was necessary to treat or prevent imminent or life-threatening deterioration of the following conditions: Concern for acute coronary syndrome.   Critical care was time spent personally by me on the following activities:  Discussions with consultants, evaluation of patient's response to treatment, examination of patient, ordering and performing treatments and interventions, ordering and review of laboratory studies, ordering and review of radiographic studies, pulse oximetry, re-evaluation of patient's condition, obtaining history from patient or surrogate and review of old charts    ED Course and Medical Decision Making  I have reviewed the triage vital signs, the nursing notes, and pertinent available records from the EMR.  Pertinent labs & imaging results that were available during my care of the patient were reviewed by me and considered in my medical decision making (see below for details).     Chest pain with concerning EKG in triage, code STEMI initiated in triage.  Patient is now pain-free, repeat EKG is much improved.  Discussed case with cardiology, they feel that the hyperacute T waves are something to be taken seriously but currently feels that emergent PCI not needed, will cancel code STEMI and await troponin.  Troponins are negative x2.  Patient has had no pain during his ED stay.  We discussed EKG with cardiology.  Patient has had similar T wave findings in prior EKGs months ago.  He has known CAD and he is being  scheduled for bypass.  Appropriate for him to have strict return precautions and simply follow-up with his cardiologist.  I stressed the importance of him returning to the emergency department with any chest discomfort.  Barth Kirks. Sedonia Small, [place] [email or web address]  Final Clinical Impressions(s) / ED Diagnoses     ICD-10-CM   1. Chest pain, unspecified type  R07.9   2. 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  Your exam/testing today is overall reassuring.  As discussed, it is very important that you follow-up with your cardiologist.  They are planning to do a bypass surgery.  Please return to the emergency department with any return of chest pain.  Please return to the Emergency Department if you experience any worsening of your condition.  We encourage you to follow up with a primary care provider.  Thank you for allowing [place] to be a part of your care.       Maudie Flakes, MD 12/15/19 [phone]

## 2019-12-15 NOTE — Discharge Instructions (Addendum)
You were evaluated in the Emergency Department and after careful evaluation, we did not find any emergent condition requiring admission or further testing in the hospital.  Your exam/testing today is overall reassuring.  As discussed, it is very important that you follow-up with your cardiologist.  They are planning to do a bypass surgery.  Please return to the emergency department with any return of chest pain.  Please return to the Emergency Department if you experience any worsening of your condition.  We encourage you to follow up with a primary care provider.  Thank you for allowing [place] to be a part of your care.

## 2019-12-15 NOTE — ED Notes (Signed)
ED Provider at bedside. 

## 2019-12-15 NOTE — ED Notes (Signed)
Cancel code stem

## 2019-12-15 NOTE — Progress Notes (Signed)
Cardiology Moonlighter Note  Drs Martinique and Nils Pyle, just FYI your patient [name] was seen in the ED tonight after an episode of chest pain. His ECG was similar to baseline, his troponin was normal x 2, and he had no recurrent CP in the ED. HR was in the 60's so not much room for increased beta blockade. Just letting you know as I see he has known multi-vessel CAD and is being scheduled for CABG in the very near future.   Marcie Mowers, MD Cardiology Fellow, PGY-7

## 2019-12-15 NOTE — ED Notes (Signed)
Patient verbalizes understanding of discharge instructions. Opportunity for questioning and answers were provided. Armband removed by staff, pt discharged from ED.  

## 2019-12-15 NOTE — ED Triage Notes (Signed)
Onset 5 or 5:30p pt had one episode of chest pain, felt like someone was grabbing him and jumped on him,  lasted 15-20 seconds.  Pt had been working in yard.   No other symptoms noted.   Pt took NTG x 1.  Code stemi called at triage

## 2019-12-16 ENCOUNTER — Other Ambulatory Visit: Payer: Self-pay | Admitting: *Deleted

## 2019-12-16 DIAGNOSIS — I2510 Atherosclerotic heart disease of native coronary artery without angina pectoris: Secondary | ICD-10-CM

## 2019-12-17 ENCOUNTER — Telehealth: Payer: Self-pay | Admitting: Cardiology

## 2019-12-17 NOTE — Telephone Encounter (Signed)
Patient's wife calling wanting to know if the patient needs his appointment 1/21 with Dr. Martinique. She states he has surgery 1/15 and does not know if he will be seen by Dr. Martinique in the hospital or if the patient will be well enough for the appointment. She would like a call back with advice on whether or not to keep the appointment.

## 2019-12-17 NOTE — Pre-Procedure Instructions (Signed)
[facility] - [place], [place] - [address] [place] [address] Phone: [phone] Fax: [phone]      Your procedure is scheduled on Friday, January 16th.  Report to [facility] Main Entrance "A" at 5:30 A.M., and check in at the Admitting office.  Call this number if you have problems the morning of surgery:  [phone]  Call [phone] if you have any questions prior to your surgery date Monday-Friday 8am-4pm    Remember:  Do not eat or drink after midnight the night before your surgery    Take these medicines the morning of surgery with A SIP OF WATER  cetirizine (ZYRTEC)  lansoprazole (PREVACID) olopatadine (PATANOL)-as needed fluticasone (FLONASE)-as needed acetaminophen (TYLENOL)-as needed nitroGLYCERIN (NITROSTAT)-if needed; please let nurse know if you had to use this.   As of today, STOP taking any Aleve, Naproxen, Ibuprofen, Motrin, Advil, Goody's, BC's, all herbal medications, fish oil, and all vitamins.   WHAT DO I DO ABOUT MY DIABETES MEDICATION?   Marland Kitchen Do not take oral diabetes medicines (pills) the morning of surgery- metFORMIN (GLUCOPHAGE).    HOW TO MANAGE YOUR DIABETES BEFORE AND AFTER SURGERY  Why is it important to control my blood sugar before and after surgery? . Improving blood sugar levels before and after surgery helps healing and can limit problems. . A way of improving blood sugar control is eating a healthy diet by: o  Eating less sugar and carbohydrates o  Increasing activity/exercise o  Talking with your doctor about reaching your blood sugar goals . High blood sugars (greater than 180 mg/dL) can raise your risk of infections and slow your recovery, so you will need to focus on controlling your diabetes during the weeks before surgery. . Make sure that the doctor who takes care of your diabetes knows about your planned surgery including the date and location.  How do I manage my blood sugar before  surgery? . Check your blood sugar at least 4 times a day, starting 2 days before surgery, to make sure that the level is not too high or low. . Check your blood sugar the morning of your surgery when you wake up and every 2 hours until you get to the Short Stay unit. o If your blood sugar is less than 70 mg/dL, you will need to treat for low blood sugar: - Do not take insulin. - Treat a low blood sugar (less than 70 mg/dL) with  cup of clear juice (cranberry or apple), 4 glucose tablets, OR glucose gel. - Recheck blood sugar in 15 minutes after treatment (to make sure it is greater than 70 mg/dL). If your blood sugar is not greater than 70 mg/dL on recheck, call [phone] for further instructions. . Report your blood sugar to the short stay nurse when you get to Short Stay.  . If you are admitted to the hospital after surgery: o Your blood sugar will be checked by the staff and you will probably be given insulin after surgery (instead of oral diabetes medicines) to make sure you have good blood sugar levels. o The goal for blood sugar control after surgery is 80-180 mg/dL.     The Morning of Surgery  Do not wear jewelry.  Do not wear lotions, powders, or colognes, or deodorant  Men may shave face and neck.  Do not bring valuables to the hospital.  [facility] is not responsible for any belongings or valuables.  If you are a  smoker, DO NOT Smoke 24 hours prior to surgery  If you wear a CPAP at night please bring your mask, tubing, and machine the morning of surgery   Remember that you must have someone to transport you home after your surgery, and remain with you for 24 hours if you are discharged the same day.   Please bring cases for contacts, glasses, hearing aids, dentures or bridgework because it cannot be worn into surgery.    Leave your suitcase in the car.  After surgery it may be brought to your room.  For patients admitted to the hospital, discharge time will be  determined by your treatment team.  Patients discharged the day of surgery will not be allowed to drive home.    Special instructions:   [facility]- Preparing For Surgery  Before surgery, you can play an important role. Because skin is not sterile, your skin needs to be as free of germs as possible. You can reduce the number of germs on your skin by washing with CHG (chlorahexidine gluconate) Soap before surgery.  CHG is an antiseptic cleaner which kills germs and bonds with the skin to continue killing germs even after washing.    Oral Hygiene is also important to reduce your risk of infection.  Remember - BRUSH YOUR TEETH THE MORNING OF SURGERY WITH YOUR REGULAR TOOTHPASTE  Please do not use if you have an allergy to CHG or antibacterial soaps. If your skin becomes reddened/irritated stop using the CHG.  Do not shave (including legs and underarms) for at least 48 hours prior to first CHG shower. It is OK to shave your face.  Please follow these instructions carefully.   1. Shower the NIGHT BEFORE SURGERY and the MORNING OF SURGERY with CHG Soap.   2. If you chose to wash your hair, wash your hair first as usual with your normal shampoo.  3. After you shampoo, rinse your hair and body thoroughly to remove the shampoo.  4. Use CHG as you would any other liquid soap. You can apply CHG directly to the skin and wash gently with a scrungie or a clean washcloth.   5. Apply the CHG Soap to your body ONLY FROM THE NECK DOWN.  Do not use on open wounds or open sores. Avoid contact with your eyes, ears, mouth and genitals (private parts). Wash Face and genitals (private parts)  with your normal soap.   6. Wash thoroughly, paying special attention to the area where your surgery will be performed.  7. Thoroughly rinse your body with warm water from the neck down.  8. DO NOT shower/wash with your normal soap after using and rinsing off the CHG Soap.  9. Pat yourself dry with a CLEAN  TOWEL.  10. Wear CLEAN PAJAMAS to bed the night before surgery, wear comfortable clothes the morning of surgery  11. Place CLEAN SHEETS on your bed the night of your first shower and DO NOT SLEEP WITH PETS.    Day of Surgery:  Please shower the morning of surgery with the CHG soap Do not apply any deodorants/lotions. Please wear clean clothes to the hospital/surgery center.   Remember to brush your teeth WITH YOUR REGULAR TOOTHPASTE.   Please read over the following fact sheets that you were given.

## 2019-12-17 NOTE — Telephone Encounter (Signed)
Pts wife, Nevin Bloodgood called wanting to know if pt should keep his 1/21 appt with Dr. [place] being that he is scheduled for surgery on 1/15 I advised pt and his wife to keep appt for now and give [place] a call post surgery to let us know how pt is feeling. Informed Nevin Bloodgood that we could always switch pts appt to virtual if necessary. She agreed.

## 2019-12-18 ENCOUNTER — Ambulatory Visit
Admission: RE | Admit: 2019-12-18 | Discharge: 2019-12-18 | Disposition: A | Discharge status: Home / Self Care | Payer: BC Managed Care – PPO | Source: Ambulatory Visit | Attending: Cardiothoracic Surgery | Admitting: Cardiothoracic Surgery

## 2019-12-18 ENCOUNTER — Encounter (HOSPITAL_COMMUNITY): Payer: Self-pay

## 2019-12-18 ENCOUNTER — Other Ambulatory Visit (HOSPITAL_COMMUNITY)
Admission: RE | Admit: 2019-12-18 | Discharge: 2019-12-18 | Disposition: A | Discharge status: Home / Self Care | Payer: BC Managed Care – PPO | Source: Ambulatory Visit | Attending: Cardiothoracic Surgery | Admitting: Cardiothoracic Surgery

## 2019-12-18 ENCOUNTER — Other Ambulatory Visit: Payer: Self-pay

## 2019-12-18 ENCOUNTER — Ambulatory Visit (INDEPENDENT_AMBULATORY_CARE_PROVIDER_SITE_OTHER): Payer: Self-pay | Admitting: Cardiothoracic Surgery

## 2019-12-18 ENCOUNTER — Encounter: Payer: Self-pay | Admitting: Cardiothoracic Surgery

## 2019-12-18 ENCOUNTER — Ambulatory Visit (HOSPITAL_BASED_OUTPATIENT_CLINIC_OR_DEPARTMENT_OTHER)
Admission: RE | Admit: 2019-12-18 | Discharge: 2019-12-18 | Disposition: A | Discharge status: Home / Self Care | Payer: BC Managed Care – PPO | Source: Ambulatory Visit | Attending: Cardiothoracic Surgery | Admitting: Cardiothoracic Surgery

## 2019-12-18 ENCOUNTER — Ambulatory Visit (HOSPITAL_COMMUNITY)
Admission: RE | Admit: 2019-12-18 | Discharge: 2019-12-18 | Disposition: A | Discharge status: Home / Self Care | Payer: BC Managed Care – PPO | Source: Ambulatory Visit | Attending: Cardiothoracic Surgery | Admitting: Cardiothoracic Surgery

## 2019-12-18 ENCOUNTER — Encounter (HOSPITAL_COMMUNITY)
Admission: RE | Admit: 2019-12-18 | Discharge: 2019-12-18 | Disposition: A | Discharge status: Home / Self Care | Payer: BC Managed Care – PPO | Source: Ambulatory Visit | Attending: Cardiothoracic Surgery | Admitting: Cardiothoracic Surgery

## 2019-12-18 VITALS — BP 160/78 | HR 68 | Temp 97.7°F | Resp 20 | Ht 70.0 in | Wt 158.0 lb

## 2019-12-18 DIAGNOSIS — Z7984 Long term (current) use of oral hypoglycemic drugs: Secondary | ICD-10-CM | POA: Insufficient documentation

## 2019-12-18 DIAGNOSIS — K219 Gastro-esophageal reflux disease without esophagitis: Secondary | ICD-10-CM | POA: Diagnosis not present

## 2019-12-18 DIAGNOSIS — Z87891 Personal history of nicotine dependence: Secondary | ICD-10-CM | POA: Diagnosis not present

## 2019-12-18 DIAGNOSIS — Z96669 Presence of unspecified artificial ankle joint: Secondary | ICD-10-CM | POA: Insufficient documentation

## 2019-12-18 DIAGNOSIS — I2510 Atherosclerotic heart disease of native coronary artery without angina pectoris: Secondary | ICD-10-CM | POA: Diagnosis present

## 2019-12-18 DIAGNOSIS — M1990 Unspecified osteoarthritis, unspecified site: Secondary | ICD-10-CM | POA: Insufficient documentation

## 2019-12-18 DIAGNOSIS — I10 Essential (primary) hypertension: Secondary | ICD-10-CM | POA: Insufficient documentation

## 2019-12-18 DIAGNOSIS — I480 Paroxysmal atrial fibrillation: Secondary | ICD-10-CM | POA: Insufficient documentation

## 2019-12-18 DIAGNOSIS — N400 Enlarged prostate without lower urinary tract symptoms: Secondary | ICD-10-CM | POA: Insufficient documentation

## 2019-12-18 DIAGNOSIS — Z01818 Encounter for other preprocedural examination: Secondary | ICD-10-CM | POA: Insufficient documentation

## 2019-12-18 DIAGNOSIS — Z79899 Other long term (current) drug therapy: Secondary | ICD-10-CM | POA: Insufficient documentation

## 2019-12-18 DIAGNOSIS — I200 Unstable angina: Secondary | ICD-10-CM

## 2019-12-18 DIAGNOSIS — Z7982 Long term (current) use of aspirin: Secondary | ICD-10-CM | POA: Diagnosis not present

## 2019-12-18 DIAGNOSIS — Z20822 Contact with and (suspected) exposure to covid-19: Secondary | ICD-10-CM | POA: Diagnosis not present

## 2019-12-18 DIAGNOSIS — E119 Type 2 diabetes mellitus without complications: Secondary | ICD-10-CM | POA: Diagnosis not present

## 2019-12-18 DIAGNOSIS — E785 Hyperlipidemia, unspecified: Secondary | ICD-10-CM | POA: Insufficient documentation

## 2019-12-18 DIAGNOSIS — J8410 Pulmonary fibrosis, unspecified: Secondary | ICD-10-CM | POA: Diagnosis not present

## 2019-12-18 HISTORY — DX: Dyspnea, unspecified: R06.00

## 2019-12-18 HISTORY — DX: Pneumonia, unspecified organism: J18.9

## 2019-12-18 LAB — URINALYSIS, ROUTINE W REFLEX MICROSCOPIC
Bilirubin Urine: NEGATIVE
Glucose, UA: 150 mg/dL — AB
Hgb urine dipstick: NEGATIVE
Ketones, ur: NEGATIVE mg/dL
Leukocytes,Ua: NEGATIVE
Nitrite: NEGATIVE
Protein, ur: NEGATIVE mg/dL
Specific Gravity, Urine: 1.012 (ref 1.005–1.030)
pH: 6 (ref 5.0–8.0)

## 2019-12-18 LAB — COMPREHENSIVE METABOLIC PANEL
ALT: 21 U/L (ref 0–44)
AST: 18 U/L (ref 15–41)
Albumin: 4.1 g/dL (ref 3.5–5.0)
Alkaline Phosphatase: 59 U/L (ref 38–126)
Anion gap: 12 (ref 5–15)
BUN: 17 mg/dL (ref 8–23)
CO2: 24 mmol/L (ref 22–32)
Calcium: 9.3 mg/dL (ref 8.9–10.3)
Chloride: 104 mmol/L (ref 98–111)
Creatinine, Ser: 1.03 mg/dL (ref 0.61–1.24)
GFR calc Af Amer: >60 mL/min (ref >60)
GFR calc non Af Amer: >60 mL/min (ref >60)
Glucose, Bld: 151 mg/dL — ABNORMAL HIGH (ref 70–99)
Potassium: 3.8 mmol/L (ref 3.5–5.1)
Sodium: 140 mmol/L (ref 135–145)
Total Bilirubin: 0.9 mg/dL (ref 0.3–1.2)
Total Protein: 6.5 g/dL (ref 6.5–8.1)

## 2019-12-18 LAB — PROTIME-INR
INR: 1.1 (ref 0.8–1.2)
Prothrombin Time: 14 seconds (ref 11.4–15.2)

## 2019-12-18 LAB — CBC
HCT: 41.1 % (ref 39.0–52.0)
Hemoglobin: 14 g/dL (ref 13.0–17.0)
MCH: 30.2 pg (ref 26.0–34.0)
MCHC: 34.1 g/dL (ref 30.0–36.0)
MCV: 88.6 fL (ref 80.0–100.0)
Platelets: 153 10*3/uL (ref 150–400)
RBC: 4.64 MIL/uL (ref 4.22–5.81)
RDW: 12.1 % (ref 11.5–15.5)
WBC: 6.9 10*3/uL (ref 4.0–10.5)
nRBC: 0 % (ref 0.0–0.2)

## 2019-12-18 LAB — ECHOCARDIOGRAM COMPLETE
Height: 70 in
Weight: 2528 oz

## 2019-12-18 LAB — HEMOGLOBIN A1C
Hgb A1c MFr Bld: 7.4 % — ABNORMAL HIGH (ref 4.8–5.6)
Mean Plasma Glucose: 165.68 mg/dL

## 2019-12-18 LAB — APTT: aPTT: 26 seconds (ref 24–36)

## 2019-12-18 LAB — BLOOD GAS, ARTERIAL
Acid-Base Excess: 2.3 mmol/L — ABNORMAL HIGH (ref 0.0–2.0)
Bicarbonate: 26.2 mmol/L (ref 20.0–28.0)
Drawn by: 421801
FIO2: 21
O2 Saturation: 97.2 %
Patient temperature: 37
pCO2 arterial: 40 mmHg (ref 32.0–48.0)
pH, Arterial: 7.433 (ref 7.350–7.450)
pO2, Arterial: 91.6 mmHg (ref 83.0–108.0)

## 2019-12-18 LAB — ABO/RH: ABO/RH(D): B POS

## 2019-12-18 LAB — SURGICAL PCR SCREEN
MRSA, PCR: NEGATIVE
Staphylococcus aureus: NEGATIVE

## 2019-12-18 LAB — SARS CORONAVIRUS 2 (TAT 6-24 HRS): SARS Coronavirus 2: NEGATIVE

## 2019-12-18 LAB — GLUCOSE, CAPILLARY: Glucose-Capillary: 124 mg/dL — ABNORMAL HIGH (ref 70–99)

## 2019-12-18 MED ORDER — IOPAMIDOL (ISOVUE-300) INJECTION 61%
75.0000 mL | Freq: Once | INTRAVENOUS | Status: AC | PRN
  Administered 2019-12-18: 75 mL via INTRAVENOUS

## 2019-12-18 NOTE — Research (Addendum)
[facility] Study   Reviewed of history and physical by Radio producer.   Yes No Inclusion  [x]  []  Institutional Review Board (IRB)/Independent Ethics Committee (IEC)-approved written informed consent and privacy language as per national regulations (e.g., [place] for [place] sites) must be obtained from the subject or legally authorized representative prior to any study-related procedures (including withdrawal of prohibited medication).  [x]  []  Subject agrees not to participate in another interventional study after signing the informed consent form (ICF) and until the end of study (EoS) visit has been completed.   [x]  []  Subject is ? 75 years of age at time of screening (visit   [x]  []  Subject undergoing non-emergent open chest cardiovascular surgery with use of CPB (i.e., CABG and/or valve surgery [including aortic root and ascending aorta surgery, without circulatory arrest]) within 4 weeks of screening (visit 1).  [x]  []  Subject has moderate/high risk of developing AKI following surgery: [x]  Only CABG, or single valve surgery: subject requires to have at least 2 AKI risk factors at screening [] Combined CABG and surgery for 1 or more valves: subject requires to have at least 1 AKI risk factor at screening [] Surgery for more than 1 cardiac valve: subject requires to have at least 1 AKI risk factor at screening [] Surgery for aortic root or ascending aorta without circulatory arrest: subject requires to have at least 1 AKI risk factor at screening  AKI risk factors: [x]  Age at screening of ? 70 years []  Documented history of eGFR < 60 mL/min per 1.73 m2 as per Modification of Diet in Renal Disease Study (MDRD) or CKD-EPI equation (or documented measured GFR assessment) [] o History needs to be present for at least 90 days prior to screening. Both SCr and eGFR need to be documented in the chart, and [] o eGFR at screening or  baseline needs to be < 60 mL/min per 1.73 m2, as well as per CKD-EPI equation. []  Documented history of congestive heart failure requiring hospitalization. This condition should exist for at least 90 days prior to screening. []  Documented history of diabetes mellitus (type 1 or 2) of ? 90 days prior to screening, and subject is on active antidiabetic medication treatment for ? 90 days. []  Documented history of proteinuria/albuminuria at any time point before screening [] o Urinary dipstick result of ? 2+, OR [] o Documented urinary albumin creatinine ratio measurement of ? 300 mg/g, or [] o Documented total quantity of protein in a 24-hour urine collection test ? 0.3 g/day.  [x]  []  Subject must have the ability, in the opinion of the investigator, and willingness to return for all scheduled visits and perform all assessments.  []  []  A male subject is eligible to participate if she is not pregnant see [Appendix 12.3 Contraception Requirements] and at least 1 of the following conditions applies: []  Not a woman of childbearing potential (WOCBP) as defined in [Appendix 12.3 Contraception Requirements] OR []  WOCBP who agrees to follow the contraceptive guidance as defined in [Appendix 12.3 Contraception Requirements] throughout the treatment period and for at least 30 days after the final study drug administration.  []  []  Male subject must agree not to breastfeed starting at screening and throughout the study period, and for 30 days after the final study drug administration.  []  []  Male subject must not donate ova starting at screening and throughout the study period, and for 30 days after the final study drug administration.  [x]  []  A male subject with male partner(s) of childbearing potential  must agree to use contraception as detailed in [Appendix 12.3 Contraception Requirements] during the treatment period and for at least 30 days after the final study drug administration.  '[x]'$  '[]'$  A male  subject must not donate sperm during the treatment period and for at least 30 days after the final study drug administration.  '[x]'$  '[]'$  Male subject with a pregnant or breastfeeding partner(s) must agree to remain abstinent or use a condom for the duration of the pregnancy or time partner is breastfeeding throughout the study period and for 30 days after the final study drug administration.    Exclusion  '[]'$  '[x]'$  Subject has received investigational drug within 30 days or 5 half-lives, whichever is longer, prior to screening.  '[]'$  '[x]'$  Subject has use of RRT within 30 days prior to screening.  '[]'$  '[x]'$  Subject has a known history of eGFR < 30 mL/min per 1.73 m2 as per CKD-EPI or MDRD equation at any time before screening.  '[]'$  '[x]'$  Subject has a prior kidney transplantation.  '[]'$  '[x]'$  Subject has a known or suspected glomerulonephritis (other than Diabetic Kidney Disease).  '[]'$  '[x]'$  Subject has confirmed or treated endocarditis, or other current active infection requiring antibiotic treatment, within 30 days prior to screening.  '[]'$  '[x]'$  Subject is using prohibited medications as specified in the concomitant medication section of the protocol [Section 5.1.3.1 Prohibited and Restricted Treatment].  '[]'$  '[x]'$  Subject has a prior history of intravenous drug abuse within 1 year prior to screening.  '[]'$  '[x]'$  Subject has a known chronic liver disorder with Child-Pugh B or C classification.  '[]'$  '[x]'$  Subject has any of the following abnormal liver or kidney function parameters (as assessed in visit 1 sample. If 1 of results of central or local laboratory testing fulfill the criterion, the subjects will be eligible.): '[]'$  Alanine aminotransferase (ALT), aspartate aminotransferase (AST) to > 2 times the upper limit of normal (ULN) or bilirubin increased to > 1.5 times the ULN. '[]'$  eGFR < 30 mL/min per 1.73 m2 as per CKD-EPI equation.  '[]'$  '[x]'$  Subject has use of left ventricular assist device (LVAD), intra-aortic balloon pump (IABP) or  other cardiac devices, or catecholamines within 7 days prior to screening.  '[]'$  '[x]'$  Subject has surgery scheduled to be performed without CPB (i.e., "Off-Pump" surgery).  '[]'$  '[x]'$  Subject has surgery scheduled to be performed under conditions of circulatory arrest, including planned deep hypothermic circulatory arrest.  '[]'$  '[x]'$  Subject has surgery scheduled for aortic dissection.  '[]'$  '[x]'$  Subject has surgery for a condition that is immediately life-threatening as per the discretion of the investigator.  '[]'$  '[x]'$  Subject has surgery scheduled to correct major congenital heart defect.   '[]'$  '[x]'$  Subject has current or previous malignant disease. Subjects with a history of cancer are considered eligible if the subject has undergone therapy and the subject has been considered disease free or progression free for at least 5 years. Subject with completely excised basal cell carcinoma or squamous cell carcinoma of the skin and completely excised cervical cancer in situ are also considered eligible.    Preoperatively at the Day of Surgery:  '[]'$  '[x]'$  Exclusion criteria 1 to 17 are applicable at this time.  '[]'$  '[x]'$  Subject has AKI (any stage) present at presurgery baseline at the discretion of the investigator.  '[]'$  '[x]'$  Subject has known or suspected infection/sepsis at time of presurgery baseline at the discretion of investigator.    Perioperative Exclusion Criteria:  '[]'$  '[x]'$  Subject requires Extracorporeal Membrane Oxygenation (ECMO) during or after completion  of surgery.  '[]'$  '[x]'$  Subject requires ventricular assist device (VAD) during or after completion of surgery.  '[]'$  '[x]'$  Subject has surgery performed "Off-Pump" at any time during surgery.    General:  '[]'$  '[x]'$  Subject has other condition, which, in the investigator's opinion, makes the subject unsuitable for study participation.  '[]'$  '[x]'$  Male subject who is pregnant or lactating or has a positive pregnancy test within 72 hours prior to screening and/or randomization,  has been pregnant within 6 months before screening assessment or breastfeeding within 3 months before screening or who is planning to become pregnant within the total study period.  '[]'$  '[x]'$  Subject has a known or suspected hypersensitivity to [id number] or any components of the formulation used.  '[]'$  '[x]'$  Subject is an employee of the [employer] or the Musician ([employer]) involved in the study.   [id number]  MOBILITY:    I HAVE NO PROBLEMS WALKING '[x]'$   I HAVE SLIGHT PROBLEMS WALKING '[]'$   I HAVE MODERATE PROBLEMS WALKING '[]'$   I HAVE SEVERE PROBLEMS WALKING '[]'$   I AM UNABLE TO WALK  '[]'$     SELF-CARE:   I HAVE NO PROBLEMS WASHING OR DRESSING MYSELF  '[x]'$   I HAVE SLIGHT PROBLEMS WASHING OR DRESSING MYSELF  '[]'$   I HAVE MODERATE PROBLEMS WASHING OR DRESSING MYSELF '[]'$   I HAVE SEVERE PROBLEMS WASHING OR DRESSING MYSELF  '[]'$   I HAVE SEVERE PROBLEMS WASHING OR DRESSING MYSELF  '[]'$   I AM UNABLE TO WASH OR DRESS MYSELF '[]'$     USUAL ACTIVITIES: (E.G. WORK/STUDY/HOUSEWORK/FAMILY OR LEISURE ACTIVITIES.    I HAVE NO PROBLEMS DOING MY USUAL ACTIVITIES '[x]'$   I HAVE SLIGHT PROBLEMS DOING MY USUAL ACTIVITIES '[]'$   I HAVE MODERATE PROBLEMS DOING MY USUAL ACTIVIITIES '[]'$   I HAVE SEVERE PROBLEMS DOING MY USUAL ACTIVITIES '[]'$   I AM UNABLE TO DO MY USUAL ACTIVITIES '[]'$     PAIN /DISCOMFORT   I HAVE NO PAIN OR DISCOMFORT '[x]'$   I HAVE SLIGHT PAIN OR DISCOMFORT '[]'$   I HAVE MODERATE PAIN OR DISCOMFORT '[]'$   I HAVE SEVERE PAIN OR DISCOMFORT '[]'$   I HAVE EXTREME PAIN OR DISCOMFORT '[]'$     ANXIETY/DEPRESSION   I AM NOT ANXIOUS OR DEPRESSED '[x]'$   I AM SLIGHTLY ANXIOUS OR DEPRESSED '[]'$   I AM MODERATELY ANXIOUS OR DREPRESSED '[]'$   I AM SEVERELY ANXIOUS OR DEPRESSED '[]'$   I AM EXTREMELY ANXIOUS OR DEPRESSED '[]'$     SCALE OF 0-100 HOW WOULD YOU RATE TODAY?  0 IS THE WORSE AND 100 IS THE BEST HEALTH YOU CAN IMAGINE: 80

## 2019-12-18 NOTE — Progress Notes (Signed)
  Echocardiogram 2D Echocardiogram has been performed.  [name] 12/18/2019, 4:04 PM

## 2019-12-18 NOTE — Research (Signed)
[employer] Informed Consent   Subject Name: [name]  Subject met inclusion and exclusion criteria.  The informed consent form, study requirements and expectations were reviewed with the subject and questions and concerns were addressed prior to the signing of the consent form.  The subject verbalized understanding of the trail requirements.  The subject agreed to participate in the [employer] trial and signed the informed consent.  The informed consent was obtained prior to performance of any protocol-specific procedures for the subject.  A copy of the signed informed consent was given to the subject and a copy was placed in the subject's medical record.  ,  12/18/2019, 1000 

## 2019-12-18 NOTE — Progress Notes (Signed)
PCP - Marton Redwood Cardiologist - [place]  Chest x-ray - 12/18/19 EKG - 12/15/19 Stress Test - 11/26/19 ECHO - 12/18/19 pending Cardiac Cath - 12/03/19  Sleep Study - na CPAP -   Fasting Blood Sugar - 118? Not sure states he doesn't take if after eating Checks Blood Sugar several _ times a week  Blood Thinner Instructions: Aspirin Instructions: not to take DOS    COVID TEST- 12/18/19   Anesthesia review:   Patient denies shortness of breath, fever, cough and chest pain at PAT appointment   All instructions explained to the patient, with a verbal understanding of the material. Patient agrees to go over the instructions while at home for a better understanding. Patient also instructed to self quarantine after being tested for COVID-19. The opportunity to ask questions was provided.

## 2019-12-18 NOTE — Progress Notes (Signed)
PCP is Marton Redwood, MD Referring Provider is Jettie Booze, MD  Chief Complaint  Patient presents with  . Coronary Artery Disease    f/u after Chest CT, further discuss surgery    HPI: Patient presents for further discussion and for pending multivessel coronary bypass grafting.  He was diagnosed with moderate-severe three-vessel CAD after a positive stress test.  He had some facial-jaw swelling occurring on each side which appeared to be inflammatory possibly related to parotid duct stones.  These resolved with a course of antibiotics and time.  He was evaluated by his dentist and found to have no dental disease.  During this time he had chest pain at home associated with fatigue and decreased exercise tolerance.  The pain did not resolve so he presented to the ED approximately 4 days ago.  No acute EKG changes were noted and cardiac enzymes were not elevated.  He was discharged home and completed his preoperative evaluation earlier this week.  Correlating with the onset of his chest discomfort and fatigue has been short-term memory loss.  The patient underwent a brain CT which showed no evidence of stroke but was a few small vessel disease.  CT scan of the chest showed no significant pulmonary disease with mild-moderate atherosclerotic thoracic aortic disease mainly in the mid to distal arch and proximal descending thoracic aorta.  There did not appear to be significant calcification of the ascending aorta.  Patient is now scheduled for surgery on January 15.  He will have his pre-CABG Dopplers today.  I discussed the procedure of CABG in detail with the patient and his wife including the plan to do four-vessel bypass grafting, the location of the surgical incisions, and expected postoperative recovery.  We discussed the potential risks to him including the risks of MI, stroke, bleeding, infection, organ failure, death.  We discussed that bypass surgery is the best long-term treatment for his  coronary disease related to diabetes hypertension and dyslipidemia.  Past Medical History:  Diagnosis Date  . Arthritis   . Arthritis   . Atrial fibrillation ([place])   . BPH (benign prostatic hypertrophy)   . CAD (coronary artery disease)    stents placed  . Diabetes mellitus   . Diverticulosis   . Dyspnea    when exerting self  . Dysrhythmia    "extra beats"  . Esophageal stricture   . GERD (gastroesophageal reflux disease)   . Headache(784.0)   . Hemorrhoids   . Hernia, inguinal, right   . Hiatal hernia   . Hyperlipidemia   . Hypertension   . Pneumonia   . Staph infection    Left ankle  . 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 Urinary hesitancy     Past Surgical History:  Procedure Laterality Date  . ANKLE ARTHROPLASTY  2006  . CARDIOVASCULAR STRESS TEST  01/22/2009   EF 51%, NO ISCHEMIA  . CARDIOVASCULAR STRESS TEST    . COLONOSCOPY    . CORONARY ANGIOPLASTY WITH STENT PLACEMENT  03/2005  . CORONARY STENT PLACEMENT  03/10/2005   SUCCESSFUL STENTING OF THE MID AND CRUX OF THE RCA  . ESOPHAGEAL DILATION     multiple procedures  . HERNIA REPAIR  [id number]   umbilical hernia  . INGUINAL HERNIA REPAIR  11/28/2011   Procedure: HERNIA REPAIR INGUINAL ADULT;  Surgeon: Imogene Burn. Georgette Dover, MD;  Location: [facility];  Service: General;  Laterality: Right;  right inguinal hernia repair with mesh  . KNEE SURGERY  Right  . LEFT HEART CATH AND CORONARY ANGIOGRAPHY N/A 12/03/2019   Procedure: LEFT HEART CATH AND CORONARY ANGIOGRAPHY;  Surgeon: Jettie Booze, MD;  Location: [facility];  Service: Cardiovascular;  Laterality: N/A;  . POLYPECTOMY    . TONSILLECTOMY  1958  . UMBILICAL HERNIA REPAIR  11/2006  . US ECHOCARDIOGRAPHY      Family History  Problem Relation Age of Onset  . Heart disease Mother   . Heart disease Sister   . Heart disease Brother   . Heart disease Maternal Grandfather   . Ovarian cancer Paternal Grandmother   . Cancer Paternal Grandmother         cervical  . Colon cancer Neg Hx   . Esophageal cancer Neg Hx   . Stomach cancer Neg Hx     Social History Social History   Tobacco Use  . Smoking status: Former Smoker    Packs/day: 1.00    Years: 22.00    Pack years: 22.00    Types: Cigarettes, Pipe    Quit date: 12/17/2014    Years since quitting: 5.0  . Smokeless tobacco: Never Used  Substance Use Topics  . Alcohol use: No    Alcohol/week: 0.0 standard drinks  . Drug use: No    Current Outpatient Medications  Medication Sig Dispense Refill  . acetaminophen (TYLENOL) 650 MG CR tablet Take 1,300 mg by mouth every 8 (eight) hours as needed for pain.    . APPLE CIDER VINEGAR PO Take 2 capsules by mouth daily.    Marland Kitchen aspirin EC 81 MG tablet Take 1 tablet (81 mg total) by mouth daily.    . B Complex-C (B-COMPLEX WITH VITAMIN C) tablet Take 3 tablets by mouth daily with breakfast.    . cetirizine (ZYRTEC) 10 MG tablet Take 10 mg by mouth daily.      . Coenzyme Q10 (CO Q-10) 200 MG CAPS Take 200 mg by mouth daily after supper.     . fluticasone (FLONASE) 50 MCG/ACT nasal spray Place 2 sprays into both nostrils daily as needed for allergies.     . Garlic [id number] MG TABS Take 500 mg by mouth daily with supper.    . hydrocortisone (ANUSOL-HC) 2.5 % rectal cream Place 1 application rectally 2 (two) times daily.    . lansoprazole (PREVACID) 15 MG capsule Take 15 mg by mouth daily.      Marland Kitchen lisinopril (ZESTRIL) 40 MG tablet Take 40 mg by mouth daily.    . metFORMIN (GLUCOPHAGE) 500 MG tablet Take 1 tablet (500 mg total) by mouth 2 (two) times daily with a meal.    . metoprolol succinate (TOPROL-XL) 25 MG 24 hr tablet Take 1 tablet (25 mg total) by mouth daily. Takes only 25 mg daily (Patient taking differently: Take 25 mg by mouth every evening. ) 40 tablet 1  . Multiple Minerals-Vitamins (CALCIUM-MAGNESIUM-ZINC-D3) TABS Take 3 tablets by mouth daily with lunch.    . nitroGLYCERIN (NITROSTAT) 0.4 MG SL tablet Place 1 tablet (0.4 mg total)  under the tongue every 5 (five) minutes as needed for chest pain. 25 tablet 1  . olopatadine (PATANOL) 0.1 % ophthalmic solution Place 1 drop into both eyes 2 (two) times daily as needed for allergies.    . Tamsulosin HCl (FLOMAX) 0.4 MG CAPS Take 0.4 mg by mouth daily after supper.     . Turmeric 500 MG CAPS Take 500 mg by mouth daily.     Current Facility-Administered Medications  Medication  Dose Route Frequency Provider Last Rate Last Admin  . sodium chloride flush (NS) 0.9 % injection 3 mL  3 mL Intravenous Q12H Martinique, Iriel Nason M, MD        Allergies  Allergen Reactions  . Statins Anaphylaxis    Myalgias   . Bismuth Subsalicylate Nausea And Vomiting  . Codeine Nausea And Vomiting  . Morphine And Related Nausea And Vomiting  . Altace [Ramipril] Rash    Review of Systems   No change from initial consultation Jaw swelling has resolved as noted above and dental exam was noted remarkable  BP (!) 160/78   Pulse 68   Temp 97.7 F (36.5 C) (Skin)   Resp 20   Ht 5\' 10"  (1.778 m)   Wt 158 lb (71.7 kg)   SpO2 95% Comment: RA  BMI 22.67 kg/m  Physical Exam      Exam    General- alert and comfortable    Neck- no JVD, no cervical adenopathy palpable, no carotid bruit   Lungs- clear without rales, wheezes   Cor- regular rate and rhythm, no murmur , gallop   Abdomen- soft, non-tender   Extremities - warm, non-tender, minimal edema   Neuro- oriented, appropriate, no focal weakness   Diagnostic Tests: Preoperative scans and their findings discussed with patient and wife.  ABG, electrolytes and CBC all satisfactory.  ABO group B positive   Impression: Moderate to severe symptomatic three-vessel CAD with evidence of unstable angina. Diabetes Hypertension Dyslipidemia with untoward reaction to statins Short-term memory deficit  Plan: Multivessel CABG planned on January 15 at [place], MD [facility] [phone]

## 2019-12-18 NOTE — Progress Notes (Signed)
Pre CABG studies completed. Preliminary results can be found under CV proc under chart review.  12/18/2019 4:10 PM  Zeda Gangwer, K., RDMS, RVT

## 2019-12-19 MED ORDER — SODIUM CHLORIDE 0.9 % IV SOLN
INTRAVENOUS | Status: DC
  Filled 2019-12-19 (×2): qty 30

## 2019-12-19 MED ORDER — SODIUM CHLORIDE 0.9 % IV SOLN
1.5000 g | INTRAVENOUS | Status: AC
  Administered 2019-12-20: 1.5 g via INTRAVENOUS
  Filled 2019-12-19 (×2): qty 1.5

## 2019-12-19 MED ORDER — VANCOMYCIN HCL 1250 MG/250ML IV SOLN
1250.0000 mg | INTRAVENOUS | Status: AC
  Administered 2019-12-20: 1250 mg via INTRAVENOUS
  Filled 2019-12-19 (×2): qty 250

## 2019-12-19 MED ORDER — NOREPINEPHRINE 4 MG/250ML-% IV SOLN
0.0000 ug/min | INTRAVENOUS | Status: DC
  Filled 2019-12-19 (×2): qty 250

## 2019-12-19 MED ORDER — INSULIN REGULAR(HUMAN) IN NACL 100-0.9 UT/100ML-% IV SOLN
INTRAVENOUS | Status: AC
  Administered 2019-12-20: 6 [IU]/h via INTRAVENOUS
  Filled 2019-12-19 (×2): qty 100

## 2019-12-19 MED ORDER — MILRINONE LACTATE IN DEXTROSE 20-5 MG/100ML-% IV SOLN
0.3000 ug/kg/min | INTRAVENOUS | Status: AC
  Administered 2019-12-20: .25 ug/kg/min via INTRAVENOUS
  Filled 2019-12-19 (×2): qty 100

## 2019-12-19 MED ORDER — TRANEXAMIC ACID (OHS) PUMP PRIME SOLUTION
2.0000 mg/kg | INTRAVENOUS | Status: DC
  Filled 2019-12-19 (×2): qty 1.43

## 2019-12-19 MED ORDER — PHENYLEPHRINE HCL-NACL 20-0.9 MG/250ML-% IV SOLN
30.0000 ug/min | INTRAVENOUS | Status: AC
  Administered 2019-12-20: 30 ug/min via INTRAVENOUS
  Filled 2019-12-19 (×2): qty 250

## 2019-12-19 MED ORDER — NITROGLYCERIN IN D5W 200-5 MCG/ML-% IV SOLN
2.0000 ug/min | INTRAVENOUS | Status: AC
  Administered 2019-12-20: 4 ug/min via INTRAVENOUS
  Filled 2019-12-19 (×2): qty 250

## 2019-12-19 MED ORDER — TRANEXAMIC ACID (OHS) BOLUS VIA INFUSION
15.0000 mg/kg | INTRAVENOUS | Status: AC
  Administered 2019-12-20: 1075.5 mg via INTRAVENOUS
  Filled 2019-12-19: qty 1076

## 2019-12-19 MED ORDER — MAGNESIUM SULFATE 50 % IJ SOLN
40.0000 meq | INTRAMUSCULAR | Status: DC
  Filled 2019-12-19 (×3): qty 9.85

## 2019-12-19 MED ORDER — POTASSIUM CHLORIDE 2 MEQ/ML IV SOLN
80.0000 meq | INTRAVENOUS | Status: DC
  Filled 2019-12-19 (×2): qty 40

## 2019-12-19 MED ORDER — EPINEPHRINE PF 1 MG/ML IJ SOLN
0.0000 ug/min | INTRAVENOUS | Status: DC
  Filled 2019-12-19 (×2): qty 4

## 2019-12-19 MED ORDER — TRANEXAMIC ACID 1000 MG/10ML IV SOLN
1.5000 mg/kg/h | INTRAVENOUS | Status: AC
  Administered 2019-12-20: 09:00:00 1.5 mg/kg/h via INTRAVENOUS
  Filled 2019-12-19 (×2): qty 25

## 2019-12-19 MED ORDER — DEXMEDETOMIDINE HCL IN NACL 400 MCG/100ML IV SOLN
0.1000 ug/kg/h | INTRAVENOUS | Status: AC
  Administered 2019-12-20: .3 ug/kg/h via INTRAVENOUS
  Filled 2019-12-19 (×2): qty 100

## 2019-12-19 MED ORDER — PLASMA-LYTE 148 IV SOLN
INTRAVENOUS | Status: DC
  Filled 2019-12-19 (×2): qty 2.5

## 2019-12-19 MED ORDER — SODIUM CHLORIDE 0.9 % IV SOLN
750.0000 mg | INTRAVENOUS | Status: AC
  Administered 2019-12-20: 750 mg via INTRAVENOUS
  Filled 2019-12-19 (×2): qty 750

## 2019-12-19 NOTE — Progress Notes (Signed)
Anesthesia Chart Review:  Case: [id number] Date/Time: 12/20/19 0715   Procedures:      CORONARY ARTERY BYPASS GRAFTING (CABG) (N/A Chest)     TRANSESOPHAGEAL ECHOCARDIOGRAM (TEE) (N/A )   Anesthesia type: General   Pre-op diagnosis: CAD   Location: [facility] OR ROOM 14 / [facility]   Surgeons: Ivin Poot, MD      DISCUSSION: Patient is a 74 year old male scheduled for the above procedure.  History includes former smoker (quit 12/17/14), CAD (s/p Cypher stent x2 RCA 03/10/05; severe 3V CAD 12/03/19), PAF (2017), HTN, DM2, HLD, BPH, exertional dyspnea, GERD, hiatal hernia, esophageal stricture (s/p dilatation).  12/18/2019 presurgical COVID-19 test was negative.  Anesthesia team to evaluate on the day of surgery.   VS: BP (!) 157/80   Pulse 60   Temp 36.9 C (Oral)   Resp 17   Ht 5\' 10"  (1.778 m)   Wt 71.4 kg   SpO2 100%   BMI 22.58 kg/m    PROVIDERS: Marton Redwood, MD is PCP  Martinique, Peter, MD is cardiologist   LABS: Labs reviewed: Acceptable for surgery. (all labs ordered are listed, but only abnormal results are displayed)  Labs Reviewed  GLUCOSE, CAPILLARY - Abnormal; Notable for the following components:      Result Value   Glucose-Capillary 124 (*)    All other components within normal limits  BLOOD GAS, ARTERIAL - Abnormal; Notable for the following components:   Acid-Base Excess 2.3 (*)    All other components within normal limits  COMPREHENSIVE METABOLIC PANEL - Abnormal; Notable for the following components:   Glucose, Bld 151 (*)    All other components within normal limits  HEMOGLOBIN A1C - Abnormal; Notable for the following components:   Hgb A1c MFr Bld 7.4 (*)    All other components within normal limits  URINALYSIS, ROUTINE W REFLEX MICROSCOPIC - Abnormal; Notable for the following components:   Glucose, UA 150 (*)    All other components within normal limits  SURGICAL PCR SCREEN  APTT  CBC  PROTIME-INR  TYPE AND SCREEN  ABO/RH     IMAGES: CXR  12/18/19: IMPRESSION: 1. No acute findings. 2. Severe three-vessel coronary artery atherosclerosis. 3. Sequela of prior granulomatous disease. 4. Aortic Atherosclerosis (ICD10-I70.0).    EKG: 12/15/19: Sinus rhythm Incomplete left bundle branch block Left ventricular hypertrophy Inferior infarct, age indeterminate Anterior Q waves, possibly due to LVH Confirmed by Ward, Cyril Mourning [phone]) on 12/16/2019 9:27:58 AM   CV: Carotid US 12/18/19: Summary: Right Carotid: Velocities in the right ICA are consistent with a 1-39% stenosis. Left Carotid: Velocities in the left ICA are consistent with a 1-39% stenosis. Vertebrals:  Bilateral vertebral arteries demonstrate antegrade flow. Subclavians: Normal flow hemodynamics were seen in bilateral subclavian              arteries.   Echo 12/18/19: IMPRESSIONS  1. Left ventricular ejection fraction, by visual estimation, is 55 to 60%. The left ventricle has normal function. There is no left ventricular hypertrophy.  2. Left ventricular diastolic parameters are indeterminate.  3. The left ventricle has no regional wall motion abnormalities.  4. Global right ventricle has normal systolic function.The right ventricular size is normal. No increase in right ventricular wall thickness.  5. Left atrial size was mildly dilated.  6. Right atrial size was mildly dilated.  7. The mitral valve is normal in structure. No evidence of mitral valve regurgitation. No evidence of mitral stenosis.  8. The tricuspid valve is normal in  structure.  9. The aortic valve is normal in structure. Aortic valve regurgitation is not visualized. No evidence of aortic valve sclerosis or stenosis. 10. The pulmonic valve was normal in structure. Pulmonic valve regurgitation is not visualized. 11. The atrial septum is grossly normal.   Cardiac cath 12/03/19 (done for symptoms and hight risk stress test 11/26/19: inferior infarct with mild peri-infarct ischemic, newly depressed EF  34%):  2nd Mrg lesion is 80% stenosed.  Dist Cx lesion is 90% stenosed.  Mid LAD-1 lesion is 80% stenosed.  Mid LAD-2 lesion is 75% stenosed.  Prox RCA lesion is 75% stenosed.  RPAV lesion is 95% stenosed.  Mid RCA lesion is 30% stenosed.  Dist RCA lesion is 30% stenosed.  Prox LAD lesion is 25% stenosed.  The left ventricular systolic function is normal.  The left ventricular ejection fraction is 50-55% by visual estimate.  LV end diastolic pressure is normal.  There is no aortic valve stenosis. Severe three vessel CAD.  Will refer for CABG as outpatient given minimal symptoms.    Cardiac event monitor 04/14/16-05/13/10: Study Highlights  NSR  PACs  Paroxysmal atrial fibrillation. Variable ventricular response.    Past Medical History:  Diagnosis Date  . Arthritis   . Arthritis   . Atrial fibrillation ([place])   . BPH (benign prostatic hypertrophy)   . CAD (coronary artery disease)    stents placed  . Diabetes mellitus   . Diverticulosis   . Dyspnea    when exerting self  . Dysrhythmia    "extra beats"  . Esophageal stricture   . GERD (gastroesophageal reflux disease)   . Headache(784.0)   . Hemorrhoids   . Hernia, inguinal, right   . Hiatal hernia   . Hyperlipidemia   . Hypertension   . Pneumonia   . Staph infection    Left ankle  . 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 Urinary hesitancy     Past Surgical History:  Procedure Laterality Date  . ANKLE ARTHROPLASTY  2006  . CARDIOVASCULAR STRESS TEST  01/22/2009   EF 51%, NO ISCHEMIA  . CARDIOVASCULAR STRESS TEST    . COLONOSCOPY    . CORONARY ANGIOPLASTY WITH STENT PLACEMENT  03/2005  . CORONARY STENT PLACEMENT  03/10/2005   SUCCESSFUL STENTING OF THE MID AND CRUX OF THE RCA  . ESOPHAGEAL DILATION     multiple procedures  . HERNIA REPAIR  [id number]   umbilical hernia  . INGUINAL HERNIA REPAIR  11/28/2011   Procedure: HERNIA REPAIR INGUINAL ADULT;  Surgeon: Imogene Burn. Georgette Dover, MD;  Location:  [facility];  Service: General;  Laterality: Right;  right inguinal hernia repair with mesh  . KNEE SURGERY     Right  . LEFT HEART CATH AND CORONARY ANGIOGRAPHY N/A 12/03/2019   Procedure: LEFT HEART CATH AND CORONARY ANGIOGRAPHY;  Surgeon: Jettie Booze, MD;  Location: [place] [facility];  Service: Cardiovascular;  Laterality: N/A;  . POLYPECTOMY    . TONSILLECTOMY  1958  . UMBILICAL HERNIA REPAIR  11/2006  . US ECHOCARDIOGRAPHY      MEDICATIONS: . acetaminophen (TYLENOL) 650 MG CR tablet  . APPLE CIDER VINEGAR PO  . aspirin EC 81 MG tablet  . B Complex-C (B-COMPLEX WITH VITAMIN C) tablet  . cetirizine (ZYRTEC) 10 MG tablet  . Coenzyme Q10 (CO Q-10) 200 MG CAPS  . fluticasone (FLONASE) 50 MCG/ACT nasal spray  . Garlic [id number] MG TABS  . hydrocortisone (ANUSOL-HC) 2.5 % rectal cream  . lansoprazole (  PREVACID) 15 MG capsule  . lisinopril (ZESTRIL) 40 MG tablet  . metFORMIN (GLUCOPHAGE) 500 MG tablet  . metoprolol succinate (TOPROL-XL) 25 MG 24 hr tablet  . Multiple Minerals-Vitamins (CALCIUM-MAGNESIUM-ZINC-D3) TABS  . nitroGLYCERIN (NITROSTAT) 0.4 MG SL tablet  . olopatadine (PATANOL) 0.1 % ophthalmic solution  . Tamsulosin HCl (FLOMAX) 0.4 MG CAPS  . Turmeric 500 MG CAPS   . sodium chloride flush (NS) 0.9 % injection 3 mL    Myra Gianotti, PA-C Surgical Short Stay/Anesthesiology [facility] Phone [phone] [facility] Phone [phone] 12/19/2019 9:37 AM

## 2019-12-19 NOTE — Anesthesia Preprocedure Evaluation (Addendum)
Anesthesia Evaluation  Patient identified by MRN, date of birth, ID band Patient awake    Reviewed: Allergy & Precautions, NPO status , Patient's Chart, lab work & pertinent test results, reviewed documented beta blocker date and time   Airway Mallampati: I  TM Distance: >3 FB Neck ROM: Full    Dental  (+) Teeth Intact, Dental Advisory Given, Chipped   Pulmonary former smoker,    breath sounds clear to auscultation       Cardiovascular hypertension, Pt. on medications and Pt. on home beta blockers + CAD and + Cardiac Stents  + dysrhythmias Atrial Fibrillation  Rhythm:Regular Rate:Normal     Neuro/Psych  Headaches, negative psych ROS   GI/Hepatic hiatal hernia, GERD  Medicated,  Endo/Other  diabetes, Type 2, Oral Hypoglycemic Agents  Renal/GU      Musculoskeletal  (+) Arthritis ,   Abdominal Normal abdominal exam  (+)   Peds  Hematology negative hematology ROS (+)   Anesthesia Other Findings   Reproductive/Obstetrics                            Echo: 1. Left ventricular ejection fraction, by visual estimation, is 55 to 60%. The left ventricle has normal function. There is no left ventricular hypertrophy.  2. Left ventricular diastolic parameters are indeterminate.  3. The left ventricle has no regional wall motion abnormalities.  4. Global right ventricle has normal systolic function.The right ventricular size is normal. No increase in right ventricular wall thickness.  5. Left atrial size was mildly dilated.  6. Right atrial size was mildly dilated.  7. The mitral valve is normal in structure. No evidence of mitral valve regurgitation. No evidence of mitral stenosis.  8. The tricuspid valve is normal in structure.  9. The aortic valve is normal in structure. Aortic valve regurgitation is not visualized. No evidence of aortic valve sclerosis or stenosis. 10. The pulmonic valve was normal in  structure. Pulmonic valve regurgitation is not visualized. 11. The atrial septum is grossly normal.  Anesthesia Physical Anesthesia Plan  ASA: IV  Anesthesia Plan: General   Post-op Pain Management:    Induction: Intravenous  PONV Risk Score and Plan: Ondansetron and Treatment may vary due to age or medical condition  Airway Management Planned: Oral ETT  Additional Equipment: Arterial line, CVP, PA Cath, TEE and Ultrasound Guidance Line Placement  Intra-op Plan:   Post-operative Plan: Post-operative intubation/ventilation  Informed Consent: I have reviewed the patients History and Physical, chart, labs and discussed the procedure including the risks, benefits and alternatives for the proposed anesthesia with the patient or authorized representative who has indicated his/her understanding and acceptance.       Plan Discussed with: CRNA  Anesthesia Plan Comments: (PAT note written 12/19/2019 by Myra Gianotti, PA-C. )      Anesthesia Quick Evaluation

## 2019-12-19 NOTE — Progress Notes (Deleted)
[name] Date of Birth: Apr 12, 1946 Medical Record [id number]  History of Present Illness: [name] is seen for evaluation of multiple complaints. He was last seen by me in September 2017.He has a history of CAD.   He is status post stenting of the right coronary in 2006 with a Cypher stent. His last nuclear stress test in February 2010 showed no ischemia. Ejection fraction was 51%. He has a history of HTN, DM, and HLD.  In May 2017 he was admitted with a near syncopal episode at church. He states he became very lightheaded and had to sit then lie down on the floor. He was responsive throughout and never lost consciousness. He was admitted. Ecg showed PACs and blocker PACs. Echo and carotid dopplers were unremarkable. CT of the head showed chronic white matter changes. He was DC home and an event monitor was placed. This showed one episode of paroxysmal Afib with controlled rate.  The patient was asymptomatic with this. He was anticoagulated with Xarelto.   In December he complained of not feeling well, generalized malaise, nausea,, fleeting chest pain and shoulder pain. BP was high up to 186/92. Placed on Toprol by Dr Brigitte Pulse - BP still fluctuating. He is spending a lot more time in bed which is unusual for him. Wife states memory is worse. He is no longer on Xarelto. Not on ASA. He underwent a lexiscan Myoview showing significant ischemia in the inferior wall and apex. EF 34%. He underwent cardiac cath showing severe 3 vessel obstructive CAD with EF 50-55%. He was seen by Dr Prescott Gum with plans for CABG on Jan 15. Pre op Echo and arterial dopplers as noted below.   Current Outpatient Medications on File Prior to Visit  Medication Sig Dispense Refill  . acetaminophen (TYLENOL) 650 MG CR tablet Take 1,300 mg by mouth every 8 (eight) hours as needed for pain.    . APPLE CIDER VINEGAR PO Take 2 capsules by mouth daily.    Marland Kitchen aspirin EC 81 MG tablet Take 1 tablet (81 mg total) by mouth daily.     . B Complex-C (B-COMPLEX WITH VITAMIN C) tablet Take 3 tablets by mouth daily with breakfast.    . cetirizine (ZYRTEC) 10 MG tablet Take 10 mg by mouth daily.      . Coenzyme Q10 (CO Q-10) 200 MG CAPS Take 200 mg by mouth daily after supper.     . fluticasone (FLONASE) 50 MCG/ACT nasal spray Place 2 sprays into both nostrils daily as needed for allergies.     . Garlic [id number] MG TABS Take 500 mg by mouth daily with supper.    . hydrocortisone (ANUSOL-HC) 2.5 % rectal cream Place 1 application rectally 2 (two) times daily.    . lansoprazole (PREVACID) 15 MG capsule Take 15 mg by mouth daily.      Marland Kitchen lisinopril (ZESTRIL) 40 MG tablet Take 40 mg by mouth daily.    . metFORMIN (GLUCOPHAGE) 500 MG tablet Take 1 tablet (500 mg total) by mouth 2 (two) times daily with a meal.    . metoprolol succinate (TOPROL-XL) 25 MG 24 hr tablet Take 1 tablet (25 mg total) by mouth daily. Takes only 25 mg daily (Patient taking differently: Take 25 mg by mouth every evening. ) 40 tablet 1  . Multiple Minerals-Vitamins (CALCIUM-MAGNESIUM-ZINC-D3) TABS Take 3 tablets by mouth daily with lunch.    . nitroGLYCERIN (NITROSTAT) 0.4 MG SL tablet Place 1 tablet (0.4 mg total) under the tongue every  5 (five) minutes as needed for chest pain. 25 tablet 1  . olopatadine (PATANOL) 0.1 % ophthalmic solution Place 1 drop into both eyes 2 (two) times daily as needed for allergies.    . Tamsulosin HCl (FLOMAX) 0.4 MG CAPS Take 0.4 mg by mouth daily after supper.     . Turmeric 500 MG CAPS Take 500 mg by mouth daily.     Current Facility-Administered Medications on File Prior to Visit  Medication Dose Route Frequency Provider Last Rate Last Admin  . [START ON 12/20/2019] cefUROXime (ZINACEF) 1.5 g in sodium chloride 0.9 % 100 mL IVPB  1.5 g Intravenous To OR Prescott Gum, Collier Salina, MD      . Derrill Memo ON 12/20/2019] cefUROXime (ZINACEF) 750 mg in sodium chloride 0.9 % 100 mL IVPB  750 mg Intravenous To OR Prescott Gum, Collier Salina, MD      . Derrill Memo ON  12/20/2019] dexmedetomidine (PRECEDEX) 400 MCG/100ML (4 mcg/mL) infusion  0.1-0.7 mcg/kg/hr Intravenous To OR Prescott Gum, Collier Salina, MD      . Derrill Memo ON 12/20/2019] EPINEPHrine (ADRENALIN) 4 mg in dextrose 5 % 250 mL (0.016 mg/mL) infusion  0-10 mcg/min Intravenous To OR Prescott Gum, Collier Salina, MD      . Derrill Memo ON 12/20/2019] heparin 2,500 Units, papaverine 30 mg in electrolyte-148 (PLASMALYTE-148) 500 mL irrigation   Irrigation To OR Prescott Gum, Collier Salina, MD      . Derrill Memo ON 12/20/2019] heparin 30,000 units/NS 1000 mL solution for CELLSAVER   Other To OR Prescott Gum, Collier Salina, MD      . Derrill Memo ON 12/20/2019] insulin regular, human (MYXREDLIN) 100 units/ 100 mL infusion   Intravenous To OR Prescott Gum, Collier Salina, MD      . Derrill Memo ON 12/20/2019] magnesium sulfate (IV Push/IM) injection 40 mEq  40 mEq Other To OR Prescott Gum, Collier Salina, MD      . Derrill Memo ON 12/20/2019] milrinone (PRIMACOR) 20 MG/100 ML (0.2 mg/mL) infusion  0.3 mcg/kg/min Intravenous To OR Prescott Gum, Collier Salina, MD      . Derrill Memo ON 12/20/2019] nitroGLYCERIN 50 mg in dextrose 5 % 250 mL (0.2 mg/mL) infusion  2-200 mcg/min Intravenous To OR Prescott Gum, Collier Salina, MD      . Derrill Memo ON 12/20/2019] norepinephrine (LEVOPHED) 4mg  in 26mL premix infusion  0-40 mcg/min Intravenous To OR Prescott Gum, Collier Salina, MD      . Derrill Memo ON 12/20/2019] phenylephrine (NEOSYNEPHRINE) 20-0.9 MG/250ML-% infusion  30-200 mcg/min Intravenous To OR Prescott Gum, Collier Salina, MD      . Derrill Memo ON 12/20/2019] potassium chloride injection 80 mEq  80 mEq Other To OR Prescott Gum, Collier Salina, MD      . sodium chloride flush (NS) 0.9 % injection 3 mL  3 mL Intravenous Q12H Martin[place], Trenity Pha M, MD      . Derrill Memo ON 12/20/2019] tranexamic acid (CYKLOKAPRON) 2,500 mg in sodium chloride 0.9 % 250 mL (10 mg/mL) infusion  1.5 mg/kg/hr Intravenous To OR Prescott Gum, Collier Salina, MD      . Derrill Memo ON 12/20/2019] tranexamic acid (CYKLOKAPRON) bolus via infusion - over 30 minutes 1,075.5 mg  15 mg/kg Intravenous To OR Prescott Gum, Collier Salina, MD      . Derrill Memo ON 12/20/2019]  tranexamic acid (CYKLOKAPRON) pump prime solution 143 mg  2 mg/kg Intracatheter To OR Prescott Gum, Collier Salina, MD      . Derrill Memo ON 12/20/2019] vancomycin (VANCOREADY) IVPB 1250 mg/250 mL  1,250 mg Intravenous To OR Ivin Poot, MD        Allergies  Allergen Reactions  .  Statins Anaphylaxis    Myalgias   . Bismuth Subsalicylate Nausea And Vomiting  . Codeine Nausea And Vomiting  . Morphine And Related Nausea And Vomiting  . Altace [Ramipril] Rash    Past Medical History:  Diagnosis Date  . Arthritis   . Arthritis   . Atrial fibrillation ([place])   . BPH (benign prostatic hypertrophy)   . CAD (coronary artery disease)    stents placed  . Diabetes mellitus   . Diverticulosis   . Dyspnea    when exerting self  . Dysrhythmia    "extra beats"  . Esophageal stricture   . GERD (gastroesophageal reflux disease)   . Headache(784.0)   . Hemorrhoids   . Hernia, inguinal, right   . Hiatal hernia   . Hyperlipidemia   . Hypertension   . Pneumonia   . Staph infection    Left ankle  . 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 Urinary hesitancy     Past Surgical History:  Procedure Laterality Date  . ANKLE ARTHROPLASTY  2006  . CARDIOVASCULAR STRESS TEST  01/22/2009   EF 51%, NO ISCHEMIA  . CARDIOVASCULAR STRESS TEST    . COLONOSCOPY    . CORONARY ANGIOPLASTY WITH STENT PLACEMENT  03/2005  . CORONARY STENT PLACEMENT  03/10/2005   SUCCESSFUL STENTING OF THE MID AND CRUX OF THE RCA  . ESOPHAGEAL DILATION     multiple procedures  . HERNIA REPAIR  [id number]   umbilical hernia  . INGUINAL HERNIA REPAIR  11/28/2011   Procedure: HERNIA REPAIR INGUINAL ADULT;  Surgeon: Imogene Burn. Georgette Dover, MD;  Location: [facility];  Service: General;  Laterality: Right;  right inguinal hernia repair with mesh  . KNEE SURGERY     Right  . LEFT HEART CATH AND CORONARY ANGIOGRAPHY N/A 12/03/2019   Procedure: LEFT HEART CATH AND CORONARY ANGIOGRAPHY;  Surgeon: Jettie Booze, MD;  Location: [facility];   Service: Cardiovascular;  Laterality: N/A;  . POLYPECTOMY    . TONSILLECTOMY  1958  . UMBILICAL HERNIA REPAIR  11/2006  . US ECHOCARDIOGRAPHY      Social History   Tobacco Use  Smoking Status Former Smoker  . Packs/day: 1.00  . Years: 22.00  . Pack years: 22.00  . Types: Cigarettes, Pipe  . Quit date: 12/17/2014  . Years since quitting: 5.0  Smokeless Tobacco Never Used    Social History   Substance and Sexual Activity  Alcohol Use No  . Alcohol/week: 0.0 standard drinks    Family History  Problem Relation Age of Onset  . Heart disease Mother   . Heart disease Sister   . Heart disease Brother   . Heart disease Maternal Grandfather   . Ovarian cancer Paternal Grandmother   . Cancer Paternal Grandmother        cervical  . Colon cancer Neg Hx   . Esophageal cancer Neg Hx   . Stomach cancer Neg Hx     Review of Systems: The review of systems is positive for chronic back pain related to an old fall injury.  All other systems were reviewed and are negative.  Physical Exam: There were no vitals taken for this visit. GENERAL:  Well appearing WM in NAD HEENT:  PERRL, EOMI, sclera are clear. Oropharynx is clear. He is hard of hearing NECK:  No jugular venous distention, carotid upstroke brisk and symmetric, no bruits, no thyromegaly or adenopathy LUNGS:  Clear to auscultation bilaterally CHEST:  Unremarkable HEART:  RRR,  PMI not displaced or sustained,S1 and S2 within normal limits, no S3, no S4: no clicks, no rubs, no murmurs ABD:  Soft, nontender. BS +, no masses or bruits. No hepatomegaly, no splenomegaly EXT:  2 + pulses throughout, no edema, no cyanosis no clubbing SKIN:  Warm and dry.  No rashes NEURO:  Alert and oriented x 3. Cranial nerves II through XII intact. PSYCH:  Cognitively intact    LABORATORY DATA:  Ecg today shows NSR rate 60 with LAD, LVH and old inferior infarct. Chronic ST elevation in leads Avf, V1-3. I have personally reviewed and  interpreted this study.   Lab Results  Component Value Date   WBC 6.9 12/18/2019   HGB 14.0 12/18/2019   HCT 41.1 12/18/2019   PLT 153 12/18/2019   GLUCOSE 151 (H) 12/18/2019   CHOL 234 (H) 12/15/2019   TRIG 182 (H) 12/15/2019   HDL 49 12/15/2019   LDLCALC 149 (H) 12/15/2019   ALT 21 12/18/2019   AST 18 12/18/2019   NA 140 12/18/2019   K 3.8 12/18/2019   CL 104 12/18/2019   CREATININE 1.03 12/18/2019   BUN 17 12/18/2019   CO2 24 12/18/2019   INR 1.1 12/18/2019   HGBA1C 7.4 (H) 12/18/2019   Labs reviewed from primary care dated 07/26/16: cholesterol 178, triglycerides 110, LDL 118, HDL 38. A1c 6.7%. CMET, CBC, and TSH were normal. Dated 04/09/19: cholesterol 193, triglycerides 69, HDL 46, LDL 133. Creatinine 1.2. otherwise CMET, CBC, TSH normal.  Dated 10/23/19: A1c 6.7%.   Echo: 04/11/16:Study Conclusions  - Left ventricle: The cavity size was normal. Wall thickness was  increased in a pattern of mild LVH. Systolic function was normal.  The estimated ejection fraction was in the range of 50% to 55%.  LV apical false tendon. Wall motion was normal; there were no  regional wall motion abnormalities. Left ventricular diastolic  function parameters were normal. - Left atrium: The atrium was mildly dilated. - Right atrium: The atrium was mildly dilated. - Atrial septum: Poorly visualized. - Systemic veins: The IVC measures <2.1 cm, but does not collapse  >50%, suggesting an elevated RA Pressure of 8 mmHg.  Myoview 11/26/19: Study Highlights    The left ventricular ejection fraction is moderately decreased (30-44%).  Nuclear stress EF: 34%.  There was no ST segment deviation noted during stress.  Defect 1: There is a medium defect of severe severity present in the basal inferior, mid inferior and apical inferior location.  Findings consistent with prior myocardial infarction with peri-infarct ischemia.  This is a high risk study.   Abnormal, high risk stress  nuclear study with prior inferior infarct and mild peri-infarct ischemia.  Gated ejection fraction 34% with global hypokinesis and inferior akinesis.  Moderate left ventricular enlargement.  Study interpreted as high risk due to reduced LV function.   Cardiac cath 12/03/19:  LEFT HEART CATH AND CORONARY ANGIOGRAPHY  Conclusion    2nd Mrg lesion is 80% stenosed.  Dist Cx lesion is 90% stenosed.  Mid LAD-1 lesion is 80% stenosed.  Mid LAD-2 lesion is 75% stenosed.  Prox RCA lesion is 75% stenosed.  RPAV lesion is 95% stenosed.  Mid RCA lesion is 30% stenosed.  Dist RCA lesion is 30% stenosed.  Prox LAD lesion is 25% stenosed.  The left ventricular systolic function is normal.  The left ventricular ejection fraction is 50-55% by visual estimate.  LV end diastolic pressure is normal.  There is no aortic valve stenosis.   Severe  three vessel CAD.  Will refer for CABG as outpatient given minimal symptoms. I discussed the findings with the wife and explained to her that his activity should be restricted.      Echo 12/18/19:IMPRESSIONS    1. Left ventricular ejection fraction, by visual estimation, is 55 to 60%. The left ventricle has normal function. There is no left ventricular hypertrophy.  2. Left ventricular diastolic parameters are indeterminate.  3. The left ventricle has no regional wall motion abnormalities.  4. Global right ventricle has normal systolic function.The right ventricular size is normal. No increase in right ventricular wall thickness.  5. Left atrial size was mildly dilated.  6. Right atrial size was mildly dilated.  7. The mitral valve is normal in structure. No evidence of mitral valve regurgitation. No evidence of mitral stenosis.  8. The tricuspid valve is normal in structure.  9. The aortic valve is normal in structure. Aortic valve regurgitation is not visualized. No evidence of aortic valve sclerosis or stenosis. 10. The pulmonic valve was  normal in structure. Pulmonic valve regurgitation is not visualized. 11. The atrial septum is grossly normal.   Carotid and LE arterial dopplers 12/18/19:  Summary: Right Carotid: Velocities in the right ICA are consistent with a 1-39% stenosis.  Left Carotid: Velocities in the left ICA are consistent with a 1-39% stenosis. Vertebrals:  Bilateral vertebral arteries demonstrate antegrade flow. Subclavians: Normal flow hemodynamics were seen in bilateral subclavian              arteries.  Right ABI: Resting right ankle-brachial index is within normal range. No evidence of significant right lower extremity arterial disease. Left ABI: Resting left ankle-brachial index is within normal range. No evidence of significant left lower extremity arterial disease. Right Upper Extremity: Doppler waveforms remain within normal limits with right radial compression. Doppler waveform obliterate with right ulnar compression. Left Upper Extremity: Doppler waveforms remain within normal limits with left radial compression. Doppler waveform obliterate with left ulnar compression.      Electronically signed by Harold Barban MD on 12/18/2019 at 8:30:50 PM.  Assessment / Plan: 1. Paroxysmal Afib. Asymptomatic at time arrhythmia recorded.  Mali vasc score of 3. For some reason Xarelto discontinued. No clear recurrence of Afib. In NSR today. States he had to have recurrent Moh's surgery and that's why it was stopped.   2. CAD with remote stent RCA. More recent progressive symptoms with cardiac cath showing severe 3 vessel obstructive CAD. CABG planned for January 15.   3. DM type 2. A1c 6.7%. management per primary care.   4. HTN poorly controlled. Recommend increase lisinopril to 40 mg daily. Continue Toprol XL  5. Hyperlipidemia. LDL 133. Goal < 70. States he has tried all cholesterol meds including statins which he cannot tolerate due to myalgias. Previously Unable to get PCSK 9 inhibitor due to lack of  insurance coverage but this was several years ago. We should reloook at insurance coverage for this since it is probably much better now.

## 2019-12-20 ENCOUNTER — Inpatient Hospital Stay (HOSPITAL_COMMUNITY): Payer: BC Managed Care – PPO | Admitting: Vascular Surgery

## 2019-12-20 ENCOUNTER — Inpatient Hospital Stay (HOSPITAL_COMMUNITY): Payer: BC Managed Care – PPO

## 2019-12-20 ENCOUNTER — Inpatient Hospital Stay (HOSPITAL_COMMUNITY): Payer: BC Managed Care – PPO | Admitting: Certified Registered"

## 2019-12-20 ENCOUNTER — Inpatient Hospital Stay (HOSPITAL_COMMUNITY)
Admission: RE | Disposition: A | Discharge status: Home / Self Care | Payer: Self-pay | Source: Home / Self Care | Attending: Cardiothoracic Surgery

## 2019-12-20 ENCOUNTER — Inpatient Hospital Stay (HOSPITAL_COMMUNITY)
Admission: RE | Admit: 2019-12-20 | Discharge: 2019-12-27 | DRG: 236 | Disposition: A | Discharge status: Home / Self Care | Payer: BC Managed Care – PPO | Attending: Cardiothoracic Surgery | Admitting: Cardiothoracic Surgery

## 2019-12-20 ENCOUNTER — Other Ambulatory Visit: Payer: Self-pay

## 2019-12-20 ENCOUNTER — Encounter (HOSPITAL_COMMUNITY): Payer: Self-pay | Admitting: Cardiothoracic Surgery

## 2019-12-20 DIAGNOSIS — Z87891 Personal history of nicotine dependence: Secondary | ICD-10-CM

## 2019-12-20 DIAGNOSIS — Z8249 Family history of ischemic heart disease and other diseases of the circulatory system: Secondary | ICD-10-CM

## 2019-12-20 DIAGNOSIS — Z951 Presence of aortocoronary bypass graft: Secondary | ICD-10-CM

## 2019-12-20 DIAGNOSIS — L03113 Cellulitis of right upper limb: Secondary | ICD-10-CM | POA: Diagnosis not present

## 2019-12-20 DIAGNOSIS — I25110 Atherosclerotic heart disease of native coronary artery with unstable angina pectoris: Principal | ICD-10-CM | POA: Diagnosis present

## 2019-12-20 DIAGNOSIS — E8770 Fluid overload, unspecified: Secondary | ICD-10-CM | POA: Diagnosis not present

## 2019-12-20 DIAGNOSIS — Z79899 Other long term (current) drug therapy: Secondary | ICD-10-CM | POA: Diagnosis not present

## 2019-12-20 DIAGNOSIS — D62 Acute posthemorrhagic anemia: Secondary | ICD-10-CM | POA: Diagnosis not present

## 2019-12-20 DIAGNOSIS — E119 Type 2 diabetes mellitus without complications: Secondary | ICD-10-CM | POA: Diagnosis present

## 2019-12-20 DIAGNOSIS — Z09 Encounter for follow-up examination after completed treatment for conditions other than malignant neoplasm: Secondary | ICD-10-CM

## 2019-12-20 DIAGNOSIS — R413 Other amnesia: Secondary | ICD-10-CM | POA: Diagnosis present

## 2019-12-20 DIAGNOSIS — Z888 Allergy status to other drugs, medicaments and biological substances status: Secondary | ICD-10-CM

## 2019-12-20 DIAGNOSIS — N400 Enlarged prostate without lower urinary tract symptoms: Secondary | ICD-10-CM | POA: Diagnosis present

## 2019-12-20 DIAGNOSIS — Z7984 Long term (current) use of oral hypoglycemic drugs: Secondary | ICD-10-CM

## 2019-12-20 DIAGNOSIS — E785 Hyperlipidemia, unspecified: Secondary | ICD-10-CM | POA: Diagnosis present

## 2019-12-20 DIAGNOSIS — K222 Esophageal obstruction: Secondary | ICD-10-CM | POA: Diagnosis present

## 2019-12-20 DIAGNOSIS — I480 Paroxysmal atrial fibrillation: Secondary | ICD-10-CM | POA: Diagnosis present

## 2019-12-20 DIAGNOSIS — K219 Gastro-esophageal reflux disease without esophagitis: Secondary | ICD-10-CM | POA: Diagnosis present

## 2019-12-20 DIAGNOSIS — Z885 Allergy status to narcotic agent status: Secondary | ICD-10-CM

## 2019-12-20 DIAGNOSIS — I2510 Atherosclerotic heart disease of native coronary artery without angina pectoris: Secondary | ICD-10-CM

## 2019-12-20 DIAGNOSIS — I10 Essential (primary) hypertension: Secondary | ICD-10-CM | POA: Diagnosis present

## 2019-12-20 DIAGNOSIS — Z955 Presence of coronary angioplasty implant and graft: Secondary | ICD-10-CM | POA: Diagnosis not present

## 2019-12-20 HISTORY — PX: CORONARY ARTERY BYPASS GRAFT: SHX141

## 2019-12-20 HISTORY — PX: TEE WITHOUT CARDIOVERSION: SHX5443

## 2019-12-20 LAB — POCT I-STAT 7, (LYTES, BLD GAS, ICA,H+H)
Acid-Base Excess: 7 mmol/L — ABNORMAL HIGH (ref 0.0–2.0)
Acid-Base Excess: 2 mmol/L (ref 0.0–2.0)
Acid-base deficit: 1 mmol/L (ref 0.0–2.0)
Acid-base deficit: 2 mmol/L (ref 0.0–2.0)
Bicarbonate: 32.5 mmol/L — ABNORMAL HIGH (ref 20.0–28.0)
Bicarbonate: 26.8 mmol/L (ref 20.0–28.0)
Bicarbonate: 24.1 mmol/L (ref 20.0–28.0)
Bicarbonate: 23.1 mmol/L (ref 20.0–28.0)
Bicarbonate: 24.7 mmol/L (ref 20.0–28.0)
Calcium, Ion: 1.03 mmol/L — ABNORMAL LOW (ref 1.15–1.40)
Calcium, Ion: 1.02 mmol/L — ABNORMAL LOW (ref 1.15–1.40)
Calcium, Ion: 1.05 mmol/L — ABNORMAL LOW (ref 1.15–1.40)
Calcium, Ion: 1.04 mmol/L — ABNORMAL LOW (ref 1.15–1.40)
Calcium, Ion: 1.11 mmol/L — ABNORMAL LOW (ref 1.15–1.40)
HCT: 26 % — ABNORMAL LOW (ref 39.0–52.0)
HCT: 24 % — ABNORMAL LOW (ref 39.0–52.0)
HCT: 25 % — ABNORMAL LOW (ref 39.0–52.0)
HCT: 27 % — ABNORMAL LOW (ref 39.0–52.0)
HCT: 29 % — ABNORMAL LOW (ref 39.0–52.0)
Hemoglobin: 8.8 g/dL — ABNORMAL LOW (ref 13.0–17.0)
Hemoglobin: 8.2 g/dL — ABNORMAL LOW (ref 13.0–17.0)
Hemoglobin: 8.5 g/dL — ABNORMAL LOW (ref 13.0–17.0)
Hemoglobin: 9.2 g/dL — ABNORMAL LOW (ref 13.0–17.0)
Hemoglobin: 9.9 g/dL — ABNORMAL LOW (ref 13.0–17.0)
O2 Saturation: 100 %
O2 Saturation: 100 %
O2 Saturation: 96 %
O2 Saturation: 99 %
O2 Saturation: 98 %
Patient temperature: 36.1
Patient temperature: 37.5
Patient temperature: 38
Potassium: 3.4 mmol/L — ABNORMAL LOW (ref 3.5–5.1)
Potassium: 3.3 mmol/L — ABNORMAL LOW (ref 3.5–5.1)
Potassium: 3.4 mmol/L — ABNORMAL LOW (ref 3.5–5.1)
Potassium: 3.7 mmol/L (ref 3.5–5.1)
Potassium: 4.1 mmol/L (ref 3.5–5.1)
Sodium: 143 mmol/L (ref 135–145)
Sodium: 143 mmol/L (ref 135–145)
Sodium: 145 mmol/L (ref 135–145)
Sodium: 143 mmol/L (ref 135–145)
Sodium: 140 mmol/L (ref 135–145)
TCO2: 34 mmol/L — ABNORMAL HIGH (ref 22–32)
TCO2: 28 mmol/L (ref 22–32)
TCO2: 25 mmol/L (ref 22–32)
TCO2: 24 mmol/L (ref 22–32)
TCO2: 26 mmol/L (ref 22–32)
pCO2 arterial: 52.2 mmHg — ABNORMAL HIGH (ref 32.0–48.0)
pCO2 arterial: 44.2 mmHg (ref 32.0–48.0)
pCO2 arterial: 39.4 mmHg (ref 32.0–48.0)
pCO2 arterial: 40.7 mmHg (ref 32.0–48.0)
pCO2 arterial: 39.5 mmHg (ref 32.0–48.0)
pH, Arterial: 7.401 (ref 7.350–7.450)
pH, Arterial: 7.391 (ref 7.350–7.450)
pH, Arterial: 7.39 (ref 7.350–7.450)
pH, Arterial: 7.365 (ref 7.350–7.450)
pH, Arterial: 7.409 (ref 7.350–7.450)
pO2, Arterial: 498 mmHg — ABNORMAL HIGH (ref 83.0–108.0)
pO2, Arterial: 271 mmHg — ABNORMAL HIGH (ref 83.0–108.0)
pO2, Arterial: 81 mmHg — ABNORMAL LOW (ref 83.0–108.0)
pO2, Arterial: 127 mmHg — ABNORMAL HIGH (ref 83.0–108.0)
pO2, Arterial: 118 mmHg — ABNORMAL HIGH (ref 83.0–108.0)

## 2019-12-20 LAB — GLUCOSE, CAPILLARY
Glucose-Capillary: 174 mg/dL — ABNORMAL HIGH (ref 70–99)
Glucose-Capillary: 83 mg/dL (ref 70–99)
Glucose-Capillary: 147 mg/dL — ABNORMAL HIGH (ref 70–99)
Glucose-Capillary: 134 mg/dL — ABNORMAL HIGH (ref 70–99)
Glucose-Capillary: 130 mg/dL — ABNORMAL HIGH (ref 70–99)
Glucose-Capillary: 108 mg/dL — ABNORMAL HIGH (ref 70–99)
Glucose-Capillary: 157 mg/dL — ABNORMAL HIGH (ref 70–99)
Glucose-Capillary: 196 mg/dL — ABNORMAL HIGH (ref 70–99)
Glucose-Capillary: 136 mg/dL — ABNORMAL HIGH (ref 70–99)
Glucose-Capillary: 126 mg/dL — ABNORMAL HIGH (ref 70–99)
Glucose-Capillary: 110 mg/dL — ABNORMAL HIGH (ref 70–99)

## 2019-12-20 LAB — POCT I-STAT, CHEM 8
BUN: 16 mg/dL (ref 8–23)
BUN: 14 mg/dL (ref 8–23)
BUN: 15 mg/dL (ref 8–23)
BUN: 13 mg/dL (ref 8–23)
BUN: 13 mg/dL (ref 8–23)
BUN: 14 mg/dL (ref 8–23)
Calcium, Ion: 1.15 mmol/L (ref 1.15–1.40)
Calcium, Ion: 1.03 mmol/L — ABNORMAL LOW (ref 1.15–1.40)
Calcium, Ion: 1.18 mmol/L (ref 1.15–1.40)
Calcium, Ion: 1 mmol/L — ABNORMAL LOW (ref 1.15–1.40)
Calcium, Ion: 1.03 mmol/L — ABNORMAL LOW (ref 1.15–1.40)
Calcium, Ion: 1.04 mmol/L — ABNORMAL LOW (ref 1.15–1.40)
Chloride: 100 mmol/L (ref 98–111)
Chloride: 100 mmol/L (ref 98–111)
Chloride: 103 mmol/L (ref 98–111)
Chloride: 99 mmol/L (ref 98–111)
Chloride: 100 mmol/L (ref 98–111)
Chloride: 100 mmol/L (ref 98–111)
Creatinine, Ser: 1 mg/dL (ref 0.61–1.24)
Creatinine, Ser: 0.8 mg/dL (ref 0.61–1.24)
Creatinine, Ser: 0.9 mg/dL (ref 0.61–1.24)
Creatinine, Ser: 0.7 mg/dL (ref 0.61–1.24)
Creatinine, Ser: 0.7 mg/dL (ref 0.61–1.24)
Creatinine, Ser: 0.8 mg/dL (ref 0.61–1.24)
Glucose, Bld: 193 mg/dL — ABNORMAL HIGH (ref 70–99)
Glucose, Bld: 101 mg/dL — ABNORMAL HIGH (ref 70–99)
Glucose, Bld: 117 mg/dL — ABNORMAL HIGH (ref 70–99)
Glucose, Bld: 119 mg/dL — ABNORMAL HIGH (ref 70–99)
Glucose, Bld: 121 mg/dL — ABNORMAL HIGH (ref 70–99)
Glucose, Bld: 141 mg/dL — ABNORMAL HIGH (ref 70–99)
HCT: 32 % — ABNORMAL LOW (ref 39.0–52.0)
HCT: 28 % — ABNORMAL LOW (ref 39.0–52.0)
HCT: 30 % — ABNORMAL LOW (ref 39.0–52.0)
HCT: 26 % — ABNORMAL LOW (ref 39.0–52.0)
HCT: 23 % — ABNORMAL LOW (ref 39.0–52.0)
HCT: 26 % — ABNORMAL LOW (ref 39.0–52.0)
Hemoglobin: 10.9 g/dL — ABNORMAL LOW (ref 13.0–17.0)
Hemoglobin: 10.2 g/dL — ABNORMAL LOW (ref 13.0–17.0)
Hemoglobin: 9.5 g/dL — ABNORMAL LOW (ref 13.0–17.0)
Hemoglobin: 8.8 g/dL — ABNORMAL LOW (ref 13.0–17.0)
Hemoglobin: 7.8 g/dL — ABNORMAL LOW (ref 13.0–17.0)
Hemoglobin: 8.8 g/dL — ABNORMAL LOW (ref 13.0–17.0)
Potassium: 3.8 mmol/L (ref 3.5–5.1)
Potassium: 3.2 mmol/L — ABNORMAL LOW (ref 3.5–5.1)
Potassium: 3.4 mmol/L — ABNORMAL LOW (ref 3.5–5.1)
Potassium: 3.8 mmol/L (ref 3.5–5.1)
Potassium: 3.4 mmol/L — ABNORMAL LOW (ref 3.5–5.1)
Potassium: 3.8 mmol/L (ref 3.5–5.1)
Sodium: 139 mmol/L (ref 135–145)
Sodium: 142 mmol/L (ref 135–145)
Sodium: 142 mmol/L (ref 135–145)
Sodium: 140 mmol/L (ref 135–145)
Sodium: 141 mmol/L (ref 135–145)
Sodium: 143 mmol/L (ref 135–145)
TCO2: 33 mmol/L — ABNORMAL HIGH (ref 22–32)
TCO2: 28 mmol/L (ref 22–32)
TCO2: 30 mmol/L (ref 22–32)
TCO2: 28 mmol/L (ref 22–32)
TCO2: 27 mmol/L (ref 22–32)
TCO2: 29 mmol/L (ref 22–32)

## 2019-12-20 LAB — CBC
HCT: 29.2 % — ABNORMAL LOW (ref 39.0–52.0)
HCT: 31.1 % — ABNORMAL LOW (ref 39.0–52.0)
Hemoglobin: 9.9 g/dL — ABNORMAL LOW (ref 13.0–17.0)
Hemoglobin: 10.7 g/dL — ABNORMAL LOW (ref 13.0–17.0)
MCH: 30.4 pg (ref 26.0–34.0)
MCH: 30.4 pg (ref 26.0–34.0)
MCHC: 33.9 g/dL (ref 30.0–36.0)
MCHC: 34.4 g/dL (ref 30.0–36.0)
MCV: 89.6 fL (ref 80.0–100.0)
MCV: 88.4 fL (ref 80.0–100.0)
Platelets: 100 10*3/uL — ABNORMAL LOW (ref 150–400)
Platelets: 129 10*3/uL — ABNORMAL LOW (ref 150–400)
RBC: 3.26 MIL/uL — ABNORMAL LOW (ref 4.22–5.81)
RBC: 3.52 MIL/uL — ABNORMAL LOW (ref 4.22–5.81)
RDW: 12.1 % (ref 11.5–15.5)
RDW: 12.3 % (ref 11.5–15.5)
WBC: 10.4 10*3/uL (ref 4.0–10.5)
WBC: 12.1 10*3/uL — ABNORMAL HIGH (ref 4.0–10.5)
nRBC: 0 % (ref 0.0–0.2)
nRBC: 0 % (ref 0.0–0.2)

## 2019-12-20 LAB — BASIC METABOLIC PANEL
Anion gap: 7 (ref 5–15)
BUN: 14 mg/dL (ref 8–23)
CO2: 23 mmol/L (ref 22–32)
Calcium: 7.2 mg/dL — ABNORMAL LOW (ref 8.9–10.3)
Chloride: 109 mmol/L (ref 98–111)
Creatinine, Ser: 1.08 mg/dL (ref 0.61–1.24)
GFR calc Af Amer: >60 mL/min (ref >60)
GFR calc non Af Amer: >60 mL/min (ref >60)
Glucose, Bld: 154 mg/dL — ABNORMAL HIGH (ref 70–99)
Potassium: 4 mmol/L (ref 3.5–5.1)
Sodium: 139 mmol/L (ref 135–145)

## 2019-12-20 LAB — PROTIME-INR
INR: 1.7 — ABNORMAL HIGH (ref 0.8–1.2)
Prothrombin Time: 19.4 seconds — ABNORMAL HIGH (ref 11.4–15.2)

## 2019-12-20 LAB — HEMOGLOBIN AND HEMATOCRIT, BLOOD
HCT: 26.6 % — ABNORMAL LOW (ref 39.0–52.0)
Hemoglobin: 9.2 g/dL — ABNORMAL LOW (ref 13.0–17.0)

## 2019-12-20 LAB — APTT: aPTT: 30 seconds (ref 24–36)

## 2019-12-20 LAB — PREPARE RBC (CROSSMATCH)

## 2019-12-20 LAB — ECHO INTRAOPERATIVE TEE
Height: 70 in
Weight: 2527.99 oz

## 2019-12-20 LAB — MAGNESIUM: Magnesium: 2.6 mg/dL — ABNORMAL HIGH (ref 1.7–2.4)

## 2019-12-20 LAB — PLATELET COUNT: Platelets: 112 10*3/uL — ABNORMAL LOW (ref 150–400)

## 2019-12-20 SURGERY — CORONARY ARTERY BYPASS GRAFTING (CABG)
Anesthesia: General | Site: Chest

## 2019-12-20 MED ORDER — NICARDIPINE HCL IN NACL 20-0.86 MG/200ML-% IV SOLN
5.0000 mg/h | INTRAVENOUS | Status: DC
  Filled 2019-12-20: qty 200

## 2019-12-20 MED ORDER — LACTATED RINGERS IV SOLN: INTRAVENOUS | Status: DC | PRN

## 2019-12-20 MED ORDER — INSULIN REGULAR(HUMAN) IN NACL 100-0.9 UT/100ML-% IV SOLN: INTRAVENOUS | Status: DC

## 2019-12-20 MED ORDER — SODIUM CHLORIDE 0.9 % IV SOLN
1.5000 g | Freq: Two times a day (BID) | INTRAVENOUS | Status: AC
  Administered 2019-12-20 – 2019-12-22 (×4): 1.5 g via INTRAVENOUS
  Filled 2019-12-20 (×4): qty 1.5

## 2019-12-20 MED ORDER — MIDAZOLAM HCL (PF) 10 MG/2ML IJ SOLN
INTRAMUSCULAR | Status: AC
  Filled 2019-12-20: qty 2

## 2019-12-20 MED ORDER — HEMOSTATIC AGENTS (NO CHARGE) OPTIME
TOPICAL | Status: DC | PRN
  Administered 2019-12-20: 1 via TOPICAL

## 2019-12-20 MED ORDER — DEXTROSE 50 % IV SOLN: 0.0000 mL | INTRAVENOUS | Status: DC | PRN

## 2019-12-20 MED ORDER — LIDOCAINE 2% (20 MG/ML) 5 ML SYRINGE
INTRAMUSCULAR | Status: AC
  Filled 2019-12-20: qty 5

## 2019-12-20 MED ORDER — METOPROLOL TARTRATE 12.5 MG HALF TABLET
12.5000 mg | ORAL_TABLET | Freq: Once | ORAL | Status: DC
  Filled 2019-12-20: qty 1

## 2019-12-20 MED ORDER — MIDAZOLAM HCL 2 MG/2ML IJ SOLN: 2.0000 mg | INTRAMUSCULAR | Status: DC | PRN

## 2019-12-20 MED ORDER — FENTANYL CITRATE (PF) 100 MCG/2ML IJ SOLN
50.0000 ug | INTRAMUSCULAR | Status: DC | PRN
  Administered 2019-12-20: 50 ug via INTRAVENOUS
  Administered 2019-12-20 – 2019-12-21 (×3): 100 ug via INTRAVENOUS
  Administered 2019-12-21: 50 ug via INTRAVENOUS
  Administered 2019-12-21: 100 ug via INTRAVENOUS
  Administered 2019-12-21: 50 ug via INTRAVENOUS
  Administered 2019-12-21: 100 ug via INTRAVENOUS
  Filled 2019-12-20 (×8): qty 2

## 2019-12-20 MED ORDER — METOPROLOL TARTRATE 5 MG/5ML IV SOLN: 2.5000 mg | INTRAVENOUS | Status: DC | PRN

## 2019-12-20 MED ORDER — METOPROLOL TARTRATE 12.5 MG HALF TABLET
12.5000 mg | ORAL_TABLET | Freq: Two times a day (BID) | ORAL | Status: DC
  Administered 2019-12-21 – 2019-12-26 (×12): 12.5 mg via ORAL
  Filled 2019-12-20 (×12): qty 1

## 2019-12-20 MED ORDER — ROCURONIUM BROMIDE 10 MG/ML (PF) SYRINGE
PREFILLED_SYRINGE | INTRAVENOUS | Status: AC
  Filled 2019-12-20: qty 10

## 2019-12-20 MED ORDER — SODIUM CHLORIDE 0.9% FLUSH
3.0000 mL | Freq: Two times a day (BID) | INTRAVENOUS | Status: DC
  Administered 2019-12-21 – 2019-12-27 (×6): 3 mL via INTRAVENOUS

## 2019-12-20 MED ORDER — STUDY - ASTELLAS - ASP1128 OR PLACEBO 50 MG/10 ML IJ VIAL
400.0000 mL/h | INTRAVENOUS | Status: AC
  Administered 2019-12-21 – 2019-12-22 (×2): 400 mL/h via INTRAVENOUS
  Filled 2019-12-20 (×3): qty 30

## 2019-12-20 MED ORDER — FAMOTIDINE IN NACL 20-0.9 MG/50ML-% IV SOLN
20.0000 mg | Freq: Two times a day (BID) | INTRAVENOUS | Status: AC
  Administered 2019-12-20 (×2): 20 mg via INTRAVENOUS
  Filled 2019-12-20: qty 50

## 2019-12-20 MED ORDER — BISACODYL 10 MG RE SUPP
10.0000 mg | Freq: Every day | RECTAL | Status: DC
  Filled 2019-12-20: qty 1

## 2019-12-20 MED ORDER — SODIUM CHLORIDE 0.45 % IV SOLN
INTRAVENOUS | Status: DC | PRN
  Administered 2019-12-20: 10 mL/h via INTRAVENOUS

## 2019-12-20 MED ORDER — NITROGLYCERIN IN D5W 200-5 MCG/ML-% IV SOLN: 0.0000 ug/min | INTRAVENOUS | Status: DC

## 2019-12-20 MED ORDER — BISACODYL 5 MG PO TBEC
10.0000 mg | DELAYED_RELEASE_TABLET | Freq: Every day | ORAL | Status: DC
  Administered 2019-12-21 – 2019-12-24 (×3): 10 mg via ORAL
  Filled 2019-12-20 (×5): qty 2

## 2019-12-20 MED ORDER — TAMSULOSIN HCL 0.4 MG PO CAPS
0.4000 mg | ORAL_CAPSULE | Freq: Every day | ORAL | Status: DC
  Administered 2019-12-21 – 2019-12-26 (×6): 0.4 mg via ORAL
  Filled 2019-12-20 (×6): qty 1

## 2019-12-20 MED ORDER — PROTAMINE SULFATE 10 MG/ML IV SOLN
INTRAVENOUS | Status: DC | PRN
  Administered 2019-12-20: 50 mg via INTRAVENOUS
  Administered 2019-12-20 (×2): 25 mg via INTRAVENOUS
  Administered 2019-12-20: 10 mg via INTRAVENOUS
  Administered 2019-12-20 (×2): 25 mg via INTRAVENOUS

## 2019-12-20 MED ORDER — PROPOFOL 10 MG/ML IV BOLUS
INTRAVENOUS | Status: AC
  Filled 2019-12-20: qty 40

## 2019-12-20 MED ORDER — LIDOCAINE 2% (20 MG/ML) 5 ML SYRINGE
INTRAMUSCULAR | Status: DC | PRN
  Administered 2019-12-20: 40 mg via INTRAVENOUS

## 2019-12-20 MED ORDER — LACTATED RINGERS IV SOLN: 500.0000 mL | Freq: Once | INTRAVENOUS | Status: DC | PRN

## 2019-12-20 MED ORDER — ACETAMINOPHEN 10 MG/ML IV SOLN
1000.0000 mg | Freq: Once | INTRAVENOUS | Status: AC
  Administered 2019-12-20: 21:00:00 1000 mg via INTRAVENOUS
  Filled 2019-12-20: qty 100

## 2019-12-20 MED ORDER — ACETAMINOPHEN 160 MG/5ML PO SOLN
1000.0000 mg | Freq: Four times a day (QID) | ORAL | Status: AC
  Filled 2019-12-20: qty 40.6

## 2019-12-20 MED ORDER — SODIUM CHLORIDE (PF) 0.9 % IJ SOLN
OROMUCOSAL | Status: DC | PRN
  Administered 2019-12-20 (×3): 4 mL via TOPICAL

## 2019-12-20 MED ORDER — ARTIFICIAL TEARS OPHTHALMIC OINT
TOPICAL_OINTMENT | OPHTHALMIC | Status: DC | PRN
  Administered 2019-12-20: 1 via OPHTHALMIC

## 2019-12-20 MED ORDER — CHLORHEXIDINE GLUCONATE 0.12 % MT SOLN
15.0000 mL | OROMUCOSAL | Status: AC
  Administered 2019-12-20: 15 mL via OROMUCOSAL

## 2019-12-20 MED ORDER — SODIUM CHLORIDE 0.9% IV SOLUTION
Freq: Once | INTRAVENOUS | Status: AC
  Administered 2019-12-20: 10 mL/h via INTRAVENOUS

## 2019-12-20 MED ORDER — DEXMEDETOMIDINE HCL IN NACL 400 MCG/100ML IV SOLN
0.0000 ug/kg/h | INTRAVENOUS | Status: DC
  Administered 2019-12-20: 0.4 ug/kg/h via INTRAVENOUS
  Filled 2019-12-20: qty 100

## 2019-12-20 MED ORDER — ACETAMINOPHEN 500 MG PO TABS
1000.0000 mg | ORAL_TABLET | Freq: Four times a day (QID) | ORAL | Status: AC
  Administered 2019-12-21 – 2019-12-25 (×19): 1000 mg via ORAL
  Filled 2019-12-20 (×18): qty 2

## 2019-12-20 MED ORDER — POTASSIUM CHLORIDE 10 MEQ/50ML IV SOLN
10.0000 meq | INTRAVENOUS | Status: AC
  Administered 2019-12-20 (×3): 10 meq via INTRAVENOUS

## 2019-12-20 MED ORDER — THIAMINE HCL 100 MG/ML IJ SOLN
100.0000 mg | Freq: Every day | INTRAMUSCULAR | Status: DC
  Filled 2019-12-20: qty 2

## 2019-12-20 MED ORDER — LACTATED RINGERS IV SOLN: INTRAVENOUS | Status: DC

## 2019-12-20 MED ORDER — CHLORHEXIDINE GLUCONATE 0.12 % MT SOLN
15.0000 mL | Freq: Once | OROMUCOSAL | Status: AC
  Administered 2019-12-20: 15 mL via OROMUCOSAL
  Filled 2019-12-20: qty 15

## 2019-12-20 MED ORDER — ORAL CARE MOUTH RINSE
15.0000 mL | Freq: Two times a day (BID) | OROMUCOSAL | Status: DC
  Administered 2019-12-20 – 2019-12-27 (×10): 15 mL via OROMUCOSAL

## 2019-12-20 MED ORDER — MILRINONE LACTATE IN DEXTROSE 20-5 MG/100ML-% IV SOLN
0.1250 ug/kg/min | INTRAVENOUS | Status: DC
  Administered 2019-12-21 (×2): 0.25 ug/kg/min via INTRAVENOUS
  Administered 2019-12-22: 0.125 ug/kg/min via INTRAVENOUS
  Filled 2019-12-20 (×3): qty 100

## 2019-12-20 MED ORDER — SODIUM CHLORIDE 0.9 % IV SOLN: 250.0000 mL | INTRAVENOUS | Status: DC

## 2019-12-20 MED ORDER — HEPARIN SODIUM (PORCINE) 1000 UNIT/ML IJ SOLN
INTRAMUSCULAR | Status: DC | PRN
  Administered 2019-12-20: 24000 [IU] via INTRAVENOUS
  Administered 2019-12-20: 2000 [IU] via INTRAVENOUS

## 2019-12-20 MED ORDER — ACETAMINOPHEN 650 MG RE SUPP
650.0000 mg | Freq: Once | RECTAL | Status: AC
  Administered 2019-12-20: 650 mg via RECTAL

## 2019-12-20 MED ORDER — 0.9 % SODIUM CHLORIDE (POUR BTL) OPTIME
TOPICAL | Status: DC | PRN
  Administered 2019-12-20: 10:00:00 6000 mL

## 2019-12-20 MED ORDER — PHENYLEPHRINE HCL-NACL 20-0.9 MG/250ML-% IV SOLN: 0.0000 ug/min | INTRAVENOUS | Status: DC

## 2019-12-20 MED ORDER — CHLORHEXIDINE GLUCONATE 4 % EX LIQD: 30.0000 mL | CUTANEOUS | Status: DC

## 2019-12-20 MED ORDER — METOPROLOL TARTRATE 25 MG/10 ML ORAL SUSPENSION: 12.5000 mg | Freq: Two times a day (BID) | ORAL | Status: DC

## 2019-12-20 MED ORDER — VANCOMYCIN HCL IN DEXTROSE 1-5 GM/200ML-% IV SOLN
1000.0000 mg | Freq: Two times a day (BID) | INTRAVENOUS | Status: AC
  Administered 2019-12-20 – 2019-12-21 (×2): 1000 mg via INTRAVENOUS
  Filled 2019-12-20 (×2): qty 200

## 2019-12-20 MED ORDER — ARTIFICIAL TEARS OPHTHALMIC OINT
TOPICAL_OINTMENT | OPHTHALMIC | Status: AC
  Filled 2019-12-20: qty 3.5

## 2019-12-20 MED ORDER — PANTOPRAZOLE SODIUM 40 MG PO TBEC
40.0000 mg | DELAYED_RELEASE_TABLET | Freq: Every day | ORAL | Status: DC
  Administered 2019-12-22 – 2019-12-27 (×6): 40 mg via ORAL
  Filled 2019-12-20 (×6): qty 1

## 2019-12-20 MED ORDER — CHLORHEXIDINE GLUCONATE CLOTH 2 % EX PADS
6.0000 | MEDICATED_PAD | Freq: Every day | CUTANEOUS | Status: DC
  Administered 2019-12-20 – 2019-12-24 (×4): 6 via TOPICAL

## 2019-12-20 MED ORDER — ROCURONIUM BROMIDE 10 MG/ML (PF) SYRINGE
PREFILLED_SYRINGE | INTRAVENOUS | Status: DC | PRN
  Administered 2019-12-20: 30 mg via INTRAVENOUS
  Administered 2019-12-20: 60 mg via INTRAVENOUS
  Administered 2019-12-20: 50 mg via INTRAVENOUS
  Administered 2019-12-20: 40 mg via INTRAVENOUS
  Administered 2019-12-20 (×2): 50 mg via INTRAVENOUS
  Administered 2019-12-20: 20 mg via INTRAVENOUS

## 2019-12-20 MED ORDER — SODIUM CHLORIDE 0.9% FLUSH: 3.0000 mL | INTRAVENOUS | Status: DC | PRN

## 2019-12-20 MED ORDER — ROCURONIUM BROMIDE 100 MG/10ML IV SOLN: INTRAVENOUS | Status: DC | PRN

## 2019-12-20 MED ORDER — SODIUM CHLORIDE 0.9 % IV SOLN
INTRAVENOUS | Status: DC
  Administered 2019-12-20: 10 mL/h via INTRAVENOUS

## 2019-12-20 MED ORDER — ONDANSETRON HCL 4 MG/2ML IJ SOLN
4.0000 mg | Freq: Four times a day (QID) | INTRAMUSCULAR | Status: DC | PRN
  Administered 2019-12-21: 4 mg via INTRAVENOUS
  Filled 2019-12-20: qty 2

## 2019-12-20 MED ORDER — VANCOMYCIN HCL IN DEXTROSE 1-5 GM/200ML-% IV SOLN: 1000.0000 mg | Freq: Once | INTRAVENOUS | Status: DC

## 2019-12-20 MED ORDER — FENTANYL CITRATE (PF) 250 MCG/5ML IJ SOLN
INTRAMUSCULAR | Status: AC
  Filled 2019-12-20: qty 25

## 2019-12-20 MED ORDER — ALBUMIN HUMAN 5 % IV SOLN
250.0000 mL | INTRAVENOUS | Status: AC | PRN
  Administered 2019-12-20: 12.5 g via INTRAVENOUS

## 2019-12-20 MED ORDER — ACETAMINOPHEN 160 MG/5ML PO SOLN: 650.0000 mg | Freq: Once | ORAL | Status: AC

## 2019-12-20 MED ORDER — MIDAZOLAM HCL 5 MG/5ML IJ SOLN
INTRAMUSCULAR | Status: DC | PRN
  Administered 2019-12-20: 2 mg via INTRAVENOUS
  Administered 2019-12-20: 3 mg via INTRAVENOUS
  Administered 2019-12-20: 2 mg via INTRAVENOUS
  Administered 2019-12-20: 3 mg via INTRAVENOUS

## 2019-12-20 MED ORDER — STUDY - ASTELLAS - ASP1128 OR PLACEBO 50 MG/10 ML IJ VIAL
400.0000 mL/h | Freq: Once | INTRAVENOUS | Status: AC
  Administered 2019-12-20: 20:00:00 400 mL/h via INTRAVENOUS
  Filled 2019-12-20: qty 30

## 2019-12-20 MED ORDER — PROPOFOL 10 MG/ML IV BOLUS
INTRAVENOUS | Status: DC | PRN
  Administered 2019-12-20: 80 mg via INTRAVENOUS

## 2019-12-20 MED ORDER — FENTANYL CITRATE (PF) 250 MCG/5ML IJ SOLN
INTRAMUSCULAR | Status: DC | PRN
  Administered 2019-12-20 (×3): 100 ug via INTRAVENOUS
  Administered 2019-12-20: 150 ug via INTRAVENOUS
  Administered 2019-12-20: 100 ug via INTRAVENOUS
  Administered 2019-12-20: 50 ug via INTRAVENOUS
  Administered 2019-12-20 (×2): 150 ug via INTRAVENOUS
  Administered 2019-12-20: 75 ug via INTRAVENOUS
  Administered 2019-12-20: 200 ug via INTRAVENOUS
  Administered 2019-12-20: 75 ug via INTRAVENOUS

## 2019-12-20 MED ORDER — MAGNESIUM SULFATE 4 GM/100ML IV SOLN
4.0000 g | Freq: Once | INTRAVENOUS | Status: AC
  Administered 2019-12-20: 4 g via INTRAVENOUS
  Filled 2019-12-20: qty 100

## 2019-12-20 MED ORDER — DOCUSATE SODIUM 100 MG PO CAPS
200.0000 mg | ORAL_CAPSULE | Freq: Every day | ORAL | Status: DC
  Administered 2019-12-21 – 2019-12-27 (×6): 200 mg via ORAL
  Filled 2019-12-20 (×6): qty 2

## 2019-12-20 MED ORDER — ALBUMIN HUMAN 5 % IV SOLN: INTRAVENOUS | Status: DC | PRN

## 2019-12-20 SURGICAL SUPPLY — 94 items
ADAPTER CARDIO PERF ANTE/RETRO (ADAPTER) ×4 IMPLANT
ADPR PRFSN 84XANTGRD RTRGD (ADAPTER) ×2
AGENT HMST KT MTR STRL THRMB (HEMOSTASIS) ×2
BAG DECANTER FOR FLEXI CONT (MISCELLANEOUS) ×4 IMPLANT
BASKET HEART  (ORDER IN 25'S) (MISCELLANEOUS) ×1
BASKET HEART (ORDER IN 25'S) (MISCELLANEOUS) ×1
BASKET HEART (ORDER IN 25S) (MISCELLANEOUS) ×2 IMPLANT
BLADE CLIPPER SURG (BLADE) ×4 IMPLANT
BLADE STERNUM SYSTEM 6 (BLADE) ×4 IMPLANT
BLADE SURG 12 STRL SS (BLADE) ×4 IMPLANT
BNDG ELASTIC 4X5.8 VLCR STR LF (GAUZE/BANDAGES/DRESSINGS) ×4 IMPLANT
BNDG ELASTIC 6X5.8 VLCR STR LF (GAUZE/BANDAGES/DRESSINGS) ×4 IMPLANT
BNDG GAUZE ELAST 4 BULKY (GAUZE/BANDAGES/DRESSINGS) ×4 IMPLANT
CANISTER SUCT 3000ML PPV (MISCELLANEOUS) ×4 IMPLANT
CANNULA GUNDRY RCSP 15FR (MISCELLANEOUS) ×4 IMPLANT
CATH CPB KIT VANTRIGT (MISCELLANEOUS) ×4 IMPLANT
CATH ROBINSON RED A/P 18FR (CATHETERS) ×12 IMPLANT
CATH THORACIC 28FR RT ANG (CATHETERS) ×2 IMPLANT
CATH THORACIC 36FR RT ANG (CATHETERS) ×4 IMPLANT
CLIP FOGARTY SPRING 6M (CLIP) ×2 IMPLANT
DRAIN CHANNEL 32F RND 10.7 FF (WOUND CARE) ×4 IMPLANT
DRAPE CARDIOVASCULAR INCISE (DRAPES) ×4
DRAPE SLUSH/WARMER DISC (DRAPES) ×4 IMPLANT
DRAPE SRG 135X102X78XABS (DRAPES) ×2 IMPLANT
DRSG AQUACEL AG ADV 3.5X14 (GAUZE/BANDAGES/DRESSINGS) ×4 IMPLANT
ELECT BLADE 4.0 EZ CLEAN MEGAD (MISCELLANEOUS) ×4
ELECT BLADE 6.5 EXT (BLADE) ×2 IMPLANT
ELECT CAUTERY BLADE 6.4 (BLADE) ×4 IMPLANT
ELECT REM PT RETURN 9FT ADLT (ELECTROSURGICAL) ×8
ELECTRODE BLDE 4.0 EZ CLN MEGD (MISCELLANEOUS) ×2 IMPLANT
ELECTRODE REM PT RTRN 9FT ADLT (ELECTROSURGICAL) ×4 IMPLANT
FELT TEFLON 1X6 (MISCELLANEOUS) ×6 IMPLANT
GAUZE SPONGE 4X4 12PLY STRL (GAUZE/BANDAGES/DRESSINGS) ×8 IMPLANT
GLOVE BIO SURGEON STRL SZ7.5 (GLOVE) ×12 IMPLANT
GLOVE BIOGEL PI IND STRL 6 (GLOVE) IMPLANT
GLOVE BIOGEL PI IND STRL 7.0 (GLOVE) IMPLANT
GLOVE BIOGEL PI INDICATOR 6 (GLOVE) ×10
GLOVE BIOGEL PI INDICATOR 7.0 (GLOVE) ×8
GOWN STRL REUS W/ TWL LRG LVL3 (GOWN DISPOSABLE) ×8 IMPLANT
GOWN STRL REUS W/TWL LRG LVL3 (GOWN DISPOSABLE) ×40
HEMOSTAT POWDER SURGIFOAM 1G (HEMOSTASIS) ×12 IMPLANT
HEMOSTAT SURGICEL 2X14 (HEMOSTASIS) ×4 IMPLANT
INSERT FOGARTY XLG (MISCELLANEOUS) IMPLANT
KIT BASIN OR (CUSTOM PROCEDURE TRAY) ×4 IMPLANT
KIT SUCTION CATH 14FR (SUCTIONS) ×4 IMPLANT
KIT TURNOVER KIT B (KITS) ×4 IMPLANT
KIT VASOVIEW HEMOPRO 2 VH 4000 (KITS) ×4 IMPLANT
LEAD PACING MYOCARDI (MISCELLANEOUS) ×6 IMPLANT
MARKER GRAFT CORONARY BYPASS (MISCELLANEOUS) ×12 IMPLANT
NS IRRIG 1000ML POUR BTL (IV SOLUTION) ×22 IMPLANT
PACK E OPEN HEART (SUTURE) ×4 IMPLANT
PACK OPEN HEART (CUSTOM PROCEDURE TRAY) ×4 IMPLANT
PAD ARMBOARD 7.5X6 YLW CONV (MISCELLANEOUS) ×4 IMPLANT
PAD ELECT DEFIB RADIOL ZOLL (MISCELLANEOUS) ×4 IMPLANT
PENCIL BUTTON HOLSTER BLD 10FT (ELECTRODE) ×4 IMPLANT
POSITIONER HEAD DONUT 9IN (MISCELLANEOUS) ×4 IMPLANT
PUNCH AORTIC ROTATE 4.0MM (MISCELLANEOUS) IMPLANT
PUNCH AORTIC ROTATE 4.5MM 8IN (MISCELLANEOUS) ×2 IMPLANT
PUNCH AORTIC ROTATE 5MM 8IN (MISCELLANEOUS) IMPLANT
SURGIFLO W/THROMBIN 8M KIT (HEMOSTASIS) ×4 IMPLANT
SUT BONE WAX W31G (SUTURE) ×4 IMPLANT
SUT MNCRL AB 4-0 PS2 18 (SUTURE) IMPLANT
SUT PROLENE 3 0 SH DA (SUTURE) IMPLANT
SUT PROLENE 3 0 SH1 36 (SUTURE) IMPLANT
SUT PROLENE 4 0 RB 1 (SUTURE) ×4
SUT PROLENE 4 0 SH DA (SUTURE) ×4 IMPLANT
SUT PROLENE 4-0 RB1 .5 CRCL 36 (SUTURE) ×2 IMPLANT
SUT PROLENE 5 0 C 1 36 (SUTURE) IMPLANT
SUT PROLENE 6 0 C 1 30 (SUTURE) ×6 IMPLANT
SUT PROLENE 6 0 CC (SUTURE) ×20 IMPLANT
SUT PROLENE 8 0 BV175 6 (SUTURE) IMPLANT
SUT PROLENE BLUE 7 0 (SUTURE) ×6 IMPLANT
SUT SILK  1 MH (SUTURE)
SUT SILK 1 MH (SUTURE) IMPLANT
SUT SILK 2 0 SH CR/8 (SUTURE) ×2 IMPLANT
SUT SILK 3 0 SH CR/8 (SUTURE) IMPLANT
SUT STEEL 6MS V (SUTURE) ×6 IMPLANT
SUT STEEL SZ 6 DBL 3X14 BALL (SUTURE) ×4 IMPLANT
SUT VIC AB 1 CTX 36 (SUTURE) ×8
SUT VIC AB 1 CTX36XBRD ANBCTR (SUTURE) ×4 IMPLANT
SUT VIC AB 2-0 CT1 27 (SUTURE) ×8
SUT VIC AB 2-0 CT1 TAPERPNT 27 (SUTURE) IMPLANT
SUT VIC AB 2-0 CTX 27 (SUTURE) IMPLANT
SUT VIC AB 3-0 X1 27 (SUTURE) ×4 IMPLANT
SYSTEM SAHARA CHEST DRAIN ATS (WOUND CARE) ×4 IMPLANT
TAPE CLOTH SURG 4X10 WHT LF (GAUZE/BANDAGES/DRESSINGS) ×2 IMPLANT
TAPE PAPER 2X10 WHT MICROPORE (GAUZE/BANDAGES/DRESSINGS) ×2 IMPLANT
TOWEL GREEN STERILE (TOWEL DISPOSABLE) ×4 IMPLANT
TOWEL GREEN STERILE FF (TOWEL DISPOSABLE) ×2 IMPLANT
TRAY FOLEY SLVR 16FR TEMP STAT (SET/KITS/TRAYS/PACK) ×4 IMPLANT
TUBE SUCT INTRACARD DLP 20F (MISCELLANEOUS) ×2 IMPLANT
TUBING LAP HI FLOW INSUFFLATIO (TUBING) ×4 IMPLANT
UNDERPAD 30X30 (UNDERPADS AND DIAPERS) ×4 IMPLANT
WATER STERILE IRR 1000ML POUR (IV SOLUTION) ×8 IMPLANT

## 2019-12-20 NOTE — Anesthesia Procedure Notes (Signed)
Arterial Line Insertion Start/End1/15/2021 7:00 AM, 12/20/2019 7:10 AM Performed by: Griffin Dakin, CRNA, CRNA  Patient location: Pre-op. Preanesthetic checklist: patient identified, IV checked, site marked, risks and benefits discussed, surgical consent, monitors and equipment checked, pre-op evaluation, timeout performed and anesthesia consent Lidocaine 1% used for infiltration Left, radial was placed Catheter size: 20 Fr Hand hygiene performed  and maximum sterile barriers used   Attempts: 1 Procedure performed without using ultrasound guided technique. Following insertion, dressing applied. Post procedure assessment: normal and unchanged  Patient tolerated the procedure well with no immediate complications.

## 2019-12-20 NOTE — Research (Addendum)
[place] Study   Pre Infusion labs collected @ [facility] given @ [phone]  EOI labs collected @ 2005  2-4 hour post infusions labs collected @ 1005  4-12 hour post infusion labs collected @ [phone] 1/16

## 2019-12-20 NOTE — Research (Addendum)
[place] Study  Visit 2  Pre Surgery labs collected @ [phone] Pre Surgery urine collected @ 0815.  Pre Infusion labs collected @ [facility] given @ 1944  EOI labs collected @ 2005  2-4 hour post infusions labs collected @ 1005  4-12 hour post infusion labs collected @ 0325 1/16  ABNORMAL LABS   [x]    No change or change not clinically significant. NO FOLLOW UP REQUIRED. []    Change clinically significant and attributable to disease or management. NO FOLLOW UP REQUIRED. []    Change clinically significant and possibly attributable to study medication. NO FOLLOW UP REQUIRED. []    Change clinically significant and attributable to study medication. FOLLOW UP REQUIRED. []    Apparent lab error. []    Unevaluable.

## 2019-12-20 NOTE — Progress Notes (Signed)
Patient ID: [name], male   DOB: 1946-08-04, 74 y.o.   MRN: [id number] EVENING ROUNDS NOTE :     [address]       [place] [address]             [phone]                 Day of Surgery Procedure(s) (LRB): CORONARY ARTERY BYPASS GRAFTING (CABG) times four, using left internal mammary artery and left greater saphenous vein (N/A) TRANSESOPHAGEAL ECHOCARDIOGRAM (TEE) (N/A)  Total Length of Stay:  LOS: 0 days  BP 104/70   Pulse 80   Temp 97.9 F (36.6 C)   Resp 16   Ht 5\' 10"  (1.778 m)   Wt 71.7 kg   SpO2 100%   BMI 22.67 kg/m   .Intake/Output      01/14 0701 - 01/15 0700 01/15 0701 - 01/16 0700   I.V. (mL/kg)  3019.1 (42.1)   Blood  308   IV Piggyback  688.2   Total Intake(mL/kg)  4015.3 (56)   Urine (mL/kg/hr)  1645 (2.4)   Blood  600   Chest Tube  134   Total Output  2379   Net  +1636.3          . sodium chloride Stopped (12/20/19 1447)  . [START ON 12/21/2019] sodium chloride    . sodium chloride    . albumin human    . cefUROXime (ZINACEF)  IV    . dexmedetomidine (PRECEDEX) IV infusion 0.4 mcg/kg/hr (12/20/19 1601)  . famotidine (PEPCID) IV Stopped (12/20/19 1427)  . insulin 0.1 Units/hr (12/20/19 1419)  . lactated ringers    . lactated ringers    . magnesium sulfate 20 mL/hr at 12/20/19 1500  . milrinone 0.25 mcg/kg/min (12/20/19 1426)  . niCARDipine    . nitroGLYCERIN    . phenylephrine (NEO-SYNEPHRINE) Adult infusion 15 mcg/min (12/20/19 1418)  . potassium chloride 10 mEq (12/20/19 1607)  . vancomycin       Lab Results  Component Value Date   WBC 10.4 12/20/2019   HGB 9.9 (L) 12/20/2019   HCT 29.2 (L) 12/20/2019   PLT 100 (L) 12/20/2019   GLUCOSE 121 (H) 12/20/2019   CHOL 234 (H) 12/15/2019   TRIG 182 (H) 12/15/2019   HDL 49 12/15/2019   LDLCALC 149 (H) 12/15/2019   ALT 21 12/18/2019   AST 18 12/18/2019   NA 143 12/20/2019   K 3.3 (L) 12/20/2019   CL 100 12/20/2019   CREATININE 0.80 12/20/2019   BUN 13  12/20/2019   CO2 24 12/18/2019   INR 1.7 (H) 12/20/2019   HGBA1C 7.4 (H) 12/18/2019   Stable postop 200 ml total from chest tubes CI 2-4 by flow stack     [name] MD  Beeper [phone] Office [phone] 12/20/2019 4:41 PM

## 2019-12-20 NOTE — Transfer of Care (Signed)
Immediate Anesthesia Transfer of Care Note  Patient: [name]  Procedure(s) Performed: CORONARY ARTERY BYPASS GRAFTING (CABG) times four, using left internal mammary artery and left greater saphenous vein (N/A Chest) TRANSESOPHAGEAL ECHOCARDIOGRAM (TEE) (N/A )  Patient Location: ICU  Anesthesia Type:General  Level of Consciousness: Patient remains intubated per anesthesia plan  Airway & Oxygen Therapy: Patient remains intubated per anesthesia plan  Post-op Assessment: Report given to RN and Post -op Vital signs reviewed and stable  Post vital signs: Reviewed and stable  Last Vitals:  Vitals Value Taken Time  BP 91/63 12/20/19 1335  Temp    Pulse 85 12/20/19 1343  Resp 12 12/20/19 1343  SpO2 98 % 12/20/19 1343  Vitals shown include unvalidated device data.  Last Pain:  Vitals:   12/20/19 0625  TempSrc:   PainSc: 0-No pain      Patients Stated Pain Goal: 2 ([id number])  Complications: No apparent anesthesia complications

## 2019-12-20 NOTE — Research (Addendum)
[facility] Research Study  Nephro check #1 @ 1600 0.12  Nephro check # 2 @ [id number] 0.31  Patient will be in the randomization arm of the study.

## 2019-12-20 NOTE — Brief Op Note (Signed)
12/20/2019  8:17 AM  PATIENT:  [name]  74 y.o. male  PRE-OPERATIVE DIAGNOSIS:  CORONARY ARTERY DISEASE  POST-OPERATIVE DIAGNOSIS:  CORONARY ARTERY DISEASE  PROCEDURE:  Procedure(s): CORONARY ARTERY BYPASS GRAFTING (CABG) times four, using left internal mammary artery and left greater saphenous vein (N/A) TRANSESOPHAGEAL ECHOCARDIOGRAM (TEE) (N/A) LIMA-LAD SVG-DIAG SVG-OM SVG-PD  SURGEON:  Surgeon(s) and Role:    Ivin Poot, MD - Primary  PHYSICIAN ASSISTANT: Andalyn Heckstall PA-C  ANESTHESIA:   general  EBL:  600 mL   BLOOD ADMINISTERED:none  DRAINS: ROUTINE PLEURAL AND MEDIASTINAL CHEST TUBES   LOCAL MEDICATIONS USED:  NONE  SPECIMEN:  No Specimen  DISPOSITION OF SPECIMEN:  N/A  COUNTS:  YES  TOURNIQUET:  * No tourniquets in log *  DICTATION: .Other Dictation: Dictation Number [id number]  PLAN OF CARE: Admit to inpatient   PATIENT DISPOSITION:  ICU - intubated and hemodynamically stable.   Delay start of Pharmacological VTE agent (>24hrs) due to surgical blood loss or risk of bleeding: yes

## 2019-12-20 NOTE — H&P (Signed)
PCP is Marton Redwood, MD Referring Provider is No ref. provider found  No chief complaint on file.   HPI: Patient presents for further discussion and for pending multivessel coronary bypass grafting.  He was diagnosed with moderate-severe three-vessel CAD after a positive stress test.  He had some facial-jaw swelling occurring on each side which appeared to be inflammatory possibly related to parotid duct stones.  These resolved with a course of antibiotics and time.  He was evaluated by his dentist and found to have no dental disease.  During this time he had chest pain at home associated with fatigue and decreased exercise tolerance.  The pain did not resolve so he presented to the ED approximately 4 days ago.  No acute EKG changes were noted and cardiac enzymes were not elevated.  He was discharged home and completed his preoperative evaluation earlier this week.  Correlating with the onset of his chest discomfort and fatigue has been short-term memory loss.  The patient underwent a brain CT which showed no evidence of stroke but was a few small vessel disease.  CT scan of the chest showed no significant pulmonary disease with mild-moderate atherosclerotic thoracic aortic disease mainly in the mid to distal arch and proximal descending thoracic aorta.  There did not appear to be significant calcification of the ascending aorta.  Patient is now scheduled for surgery on January 15.  He will have his pre-CABG Dopplers today.  I discussed the procedure of CABG in detail with the patient and his wife including the plan to do four-vessel bypass grafting, the location of the surgical incisions, and expected postoperative recovery.  We discussed the potential risks to him including the risks of MI, stroke, bleeding, infection, organ failure, death.  We discussed that bypass surgery is the best long-term treatment for his coronary disease related to diabetes hypertension and dyslipidemia.  Past Medical History:   Diagnosis Date  . Arthritis   . Arthritis   . Atrial fibrillation ([place])   . BPH (benign prostatic hypertrophy)   . CAD (coronary artery disease)    stents placed  . Diabetes mellitus   . Diverticulosis   . Dyspnea    when exerting self  . Dysrhythmia    "extra beats"  . Esophageal stricture   . GERD (gastroesophageal reflux disease)   . Headache(784.0)   . Hemorrhoids   . Hernia, inguinal, right   . Hiatal hernia   . Hyperlipidemia   . Hypertension   . Pneumonia   . Staph infection    Left ankle  . 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 Urinary hesitancy     Past Surgical History:  Procedure Laterality Date  . ANKLE ARTHROPLASTY  2006  . CARDIOVASCULAR STRESS TEST  01/22/2009   EF 51%, NO ISCHEMIA  . CARDIOVASCULAR STRESS TEST    . COLONOSCOPY    . CORONARY ANGIOPLASTY WITH STENT PLACEMENT  03/2005  . CORONARY STENT PLACEMENT  03/10/2005   SUCCESSFUL STENTING OF THE MID AND CRUX OF THE RCA  . ESOPHAGEAL DILATION     multiple procedures  . HERNIA REPAIR  [id number]   umbilical hernia  . INGUINAL HERNIA REPAIR  11/28/2011   Procedure: HERNIA REPAIR INGUINAL ADULT;  Surgeon: Imogene Burn. Georgette Dover, MD;  Location: [facility];  Service: General;  Laterality: Right;  right inguinal hernia repair with mesh  . KNEE SURGERY     Right  . LEFT HEART CATH AND CORONARY ANGIOGRAPHY N/A 12/03/2019   Procedure: LEFT HEART CATH  AND CORONARY ANGIOGRAPHY;  Surgeon: Jettie Booze, MD;  Location: [facility];  Service: Cardiovascular;  Laterality: N/A;  . POLYPECTOMY    . TONSILLECTOMY  1958  . UMBILICAL HERNIA REPAIR  11/2006  . US ECHOCARDIOGRAPHY      Family History  Problem Relation Age of Onset  . Heart disease Mother   . Heart disease Sister   . Heart disease Brother   . Heart disease Maternal Grandfather   . Ovarian cancer Paternal Grandmother   . Cancer Paternal Grandmother        cervical  . Colon cancer Neg Hx   . Esophageal cancer Neg Hx   . Stomach cancer  Neg Hx     Social History Social History   Tobacco Use  . Smoking status: Former Smoker    Packs/day: 1.00    Years: 22.00    Pack years: 22.00    Types: Cigarettes, Pipe    Quit date: 12/17/2014    Years since quitting: 5.0  . Smokeless tobacco: Never Used  Substance Use Topics  . Alcohol use: No    Alcohol/week: 0.0 standard drinks  . Drug use: No    Current Facility-Administered Medications  Medication Dose Route Frequency Provider Last Rate Last Admin  . 0.45 % sodium chloride infusion   Intravenous Continuous PRN Jadene Pierini E, PA-C   Stopped at 12/20/19 1447  . [START ON 12/21/2019] 0.9 %  sodium chloride infusion  250 mL Intravenous Continuous Gold, Wayne E, PA-C      . 0.9 %  sodium chloride infusion   Intravenous Continuous Gold, Wayne E, PA-C      . [START ON 12/21/2019] acetaminophen (TYLENOL) tablet 1,000 mg  1,000 mg Oral Q6H Gold, Wayne E, PA-C       Or  . [START ON 12/21/2019] acetaminophen (TYLENOL) 160 MG/5ML solution 1,000 mg  1,000 mg Per Tube Q6H Gold, Wayne E, PA-C      . acetaminophen (TYLENOL) 160 MG/5ML solution 650 mg  650 mg Per Tube Once Gold, Wayne E, PA-C       Or  . acetaminophen (TYLENOL) suppository 650 mg  650 mg Rectal Once Gold, Wayne E, PA-C      . albumin human 5 % solution 12.5 g  250 mL Intravenous Q15 min PRN Gold, Wayne E, PA-C      . [START ON 12/21/2019] bisacodyl (DULCOLAX) EC tablet 10 mg  10 mg Oral Daily Gold, Wayne E, PA-C       Or  . Derrill Memo ON 12/21/2019] bisacodyl (DULCOLAX) suppository 10 mg  10 mg Rectal Daily Gold, Wayne E, PA-C      . cefUROXime (ZINACEF) 1.5 g in sodium chloride 0.9 % 100 mL IVPB  1.5 g Intravenous Q12H Gold, Wayne E, PA-C      . dexmedetomidine (PRECEDEX) 400 MCG/100ML (4 mcg/mL) infusion  0-0.7 mcg/kg/hr Intravenous Continuous Gold, Wayne E, PA-C 12.55 mL/hr at 12/20/19 1416 0.7 mcg/kg/hr at 12/20/19 1416  . dextrose 50 % solution 0-50 mL  0-50 mL Intravenous PRN Gold, Wayne E, PA-C      . [START ON 12/21/2019]  docusate sodium (COLACE) capsule 200 mg  200 mg Oral Daily Gold, Wayne E, PA-C      . famotidine (PEPCID) IVPB 20 mg premix  20 mg Intravenous Q12H Gold, Wilder Glade, PA-C   Stopped at 12/20/19 1427  . fentaNYL (SUBLIMAZE) injection 50-100 mcg  50-100 mcg Intravenous Q2H PRN Gold, Wayne E, PA-C      .  insulin regular, human (MYXREDLIN) 100 units/ 100 mL infusion   Intravenous Continuous Gold, Wayne E, PA-C 0.1 mL/hr at 12/20/19 1419 0.1 Units/hr at 12/20/19 1419  . lactated ringers infusion   Intravenous Continuous Gold, Wayne E, PA-C      . lactated ringers infusion   Intravenous Continuous Gold, Wayne E, PA-C      . magnesium sulfate IVPB 4 g 100 mL  4 g Intravenous Once Gold, Wayne E, PA-C 20 mL/hr at 12/20/19 1500 Rate Verify at 12/20/19 1500  . metoprolol tartrate (LOPRESSOR) tablet 12.5 mg  12.5 mg Oral BID Gold, Wayne E, PA-C       Or  . metoprolol tartrate (LOPRESSOR) 25 mg/10 mL oral suspension 12.5 mg  12.5 mg Per Tube BID Gold, Wayne E, PA-C      . metoprolol tartrate (LOPRESSOR) injection 2.5-5 mg  2.5-5 mg Intravenous Q2H PRN Gold, Wayne E, PA-C      . midazolam (VERSED) injection 2 mg  2 mg Intravenous Q1H PRN Gold, Wayne E, PA-C      . milrinone (PRIMACOR) 20 MG/100 ML (0.2 mg/mL) infusion  0.25 mcg/kg/min Intravenous Continuous Gold, Wayne E, PA-C 5.38 mL/hr at 12/20/19 1426 0.25 mcg/kg/min at 12/20/19 1426  . nicardipine (CARDENE) 20mg  in 0.86% saline 258ml IV infusion (0.1 mg/ml)  5 mg/hr Intravenous Continuous Prescott Gum, Collier Salina, MD      . nitroGLYCERIN 50 mg in dextrose 5 % 250 mL (0.2 mg/mL) infusion  0-100 mcg/min Intravenous Titrated Gold, Wayne E, PA-C      . ondansetron (ZOFRAN) injection 4 mg  4 mg Intravenous Q6H PRN Gold, Wayne E, PA-C      . [START ON 12/22/2019] pantoprazole (PROTONIX) EC tablet 40 mg  40 mg Oral Daily Gold, Wayne E, PA-C      . phenylephrine (NEOSYNEPHRINE) 20-0.9 MG/250ML-% infusion  0-100 mcg/min Intravenous Titrated Gold, Wayne E, PA-C 11.25 mL/hr at  12/20/19 1418 15 mcg/min at 12/20/19 1418  . potassium chloride 10 mEq in 50 mL *CENTRAL LINE* IVPB  10 mEq Intravenous Q1 Hr x 3 Gold, Wayne E, PA-C 50 mL/hr at 12/20/19 1500 Rate Verify at 12/20/19 1500  . [START ON 12/21/2019] sodium chloride flush (NS) 0.9 % injection 3 mL  3 mL Intravenous Q12H Gold, Wayne E, PA-C      . [START ON 12/21/2019] sodium chloride flush (NS) 0.9 % injection 3 mL  3 mL Intravenous PRN Gold, Wayne E, PA-C      . [START ON 12/21/2019] tamsulosin (FLOMAX) capsule 0.4 mg  0.4 mg Oral QPC supper Prescott Gum, Collier Salina, MD      . thiamine (B-1) injection 100 mg  100 mg Intravenous Daily Prescott Gum, Collier Salina, MD      . vancomycin (VANCOCIN) IVPB 1000 mg/200 mL premix  1,000 mg Intravenous Q12H Ivin Poot, MD        Allergies  Allergen Reactions  . Statins Anaphylaxis    Myalgias   . Bismuth Subsalicylate Nausea And Vomiting  . Codeine Nausea And Vomiting  . Morphine And Related Nausea And Vomiting  . Altace [Ramipril] Rash    Review of Systems   No change from initial consultation Jaw swelling has resolved as noted above and dental exam was noted remarkable  BP 103/71   Pulse 80   Temp (!) 97.5 F (36.4 C)   Resp 12   Ht 5\' 10"  (1.778 m)   Wt 71.7 kg   SpO2 100%   BMI 22.67 kg/m  Physical  Exam      Exam    General- alert and comfortable    Neck- no JVD, no cervical adenopathy palpable, no carotid bruit   Lungs- clear without rales, wheezes   Cor- regular rate and rhythm, no murmur , gallop   Abdomen- soft, non-tender   Extremities - warm, non-tender, minimal edema   Neuro- oriented, appropriate, no focal weakness   Diagnostic Tests: Preoperative scans and their findings discussed with patient and wife.  ABG, electrolytes and CBC all satisfactory.  ABO group B positive   Impression: Moderate to severe symptomatic three-vessel CAD with evidence of unstable angina. Diabetes Hypertension Dyslipidemia with untoward reaction to statins Short-term  memory deficit  Plan: Multivessel CABG planned on January 15 at [place] III, MD [facility] [phone]

## 2019-12-20 NOTE — Anesthesia Procedure Notes (Signed)
Procedure Name: Intubation Date/Time: 12/20/2019 8:16 AM Performed by: Griffin Dakin, CRNA Pre-anesthesia Checklist: Patient identified, Emergency Drugs available, Suction available and Patient being monitored Patient Re-evaluated:Patient Re-evaluated prior to induction Oxygen Delivery Method: Circle system utilized Preoxygenation: Pre-oxygenation with 100% oxygen Induction Type: IV induction Ventilation: Mask ventilation without difficulty and Oral airway inserted - appropriate to patient size Laryngoscope Size: Mac and 4 Grade View: Grade II Tube type: Oral Tube size: 8.0 mm Number of attempts: 1 Airway Equipment and Method: Stylet and Oral airway Placement Confirmation: ETT inserted through vocal cords under direct vision,  positive ETCO2 and breath sounds checked- equal and bilateral Secured at: 21 cm Tube secured with: Tape Dental Injury: Teeth and Oropharynx as per pre-operative assessment

## 2019-12-20 NOTE — Progress Notes (Signed)
Pre Procedure note for inpatients:   [name] has been scheduled for Procedure(s): CORONARY ARTERY BYPASS GRAFTING (CABG) (N/A) TRANSESOPHAGEAL ECHOCARDIOGRAM (TEE) (N/A) today. The various methods of treatment have been discussed with the patient. After consideration of the risks, benefits and treatment options the patient has consented to the planned procedure.   The patient has been seen and labs reviewed. There are no changes in the patient's condition to prevent proceeding with the planned procedure today.  Recent labs:  Lab Results  Component Value Date   WBC 6.9 12/18/2019   HGB 14.0 12/18/2019   HCT 41.1 12/18/2019   PLT 153 12/18/2019   GLUCOSE 151 (H) 12/18/2019   CHOL 234 (H) 12/15/2019   TRIG 182 (H) 12/15/2019   HDL 49 12/15/2019   LDLCALC 149 (H) 12/15/2019   ALT 21 12/18/2019   AST 18 12/18/2019   NA 140 12/18/2019   K 3.8 12/18/2019   CL 104 12/18/2019   CREATININE 1.03 12/18/2019   BUN 17 12/18/2019   CO2 24 12/18/2019   INR 1.1 12/18/2019   HGBA1C 7.4 (H) 12/18/2019    Len Childs, MD 12/20/2019 7:50 AM

## 2019-12-20 NOTE — Anesthesia Procedure Notes (Signed)
Central Venous Catheter Insertion Performed by: Belinda Block, MD, anesthesiologist Start/End1/15/2021 6:40 AM, 12/20/2019 6:53 AM Patient location: Pre-op. Preanesthetic checklist: patient identified, IV checked, site marked, risks and benefits discussed, surgical consent, monitors and equipment checked, pre-op evaluation and timeout performed Position: Trendelenburg Lidocaine 1% used for infiltration and patient sedated Hand hygiene performed , maximum sterile barriers used  and Seldinger technique used PA cath was placed.Sheath introducer Swan type:thermodilution Procedure performed using ultrasound guided technique. Ultrasound Notes:anatomy identified and image(s) printed for medical record Attempts: 1 Following insertion, dressing applied and line sutured. Post procedure assessment: blood return through all ports  Patient tolerated the procedure well with no immediate complications.

## 2019-12-20 NOTE — Procedures (Signed)
Extubation Procedure Note  Patient Details:   Name: [name] DOB: 09-02-1946 MRN: [id number]   Airway Documentation:  Airway 8 mm (Active)  Secured at (cm) 23 cm 12/20/19 1400  Measured From Lips 12/20/19 1400  Secured Location Right 12/20/19 1326  Secured By Pink Tape 12/20/19 1400  Site Condition Dry 12/20/19 1400   Vent end date: (not recorded) Vent end time: (not recorded)   Evaluation  O2 sats: stable throughout Complications: No apparent complications Patient did tolerate procedure well. Bilateral Breath Sounds: Clear, Diminished   Yes, VC 1.2 L  NIF -[id number] [name]  Gonzella Lex 12/20/2019, 6:08 PM

## 2019-12-20 NOTE — Op Note (Signed)
NAME: [name], [name]. MEDICAL RECORD [id number] ACCOUNT [id number] DATE OF BIRTH:15-Dec-1945 FACILITY: [facility] LOCATION: [facility] PHYSICIAN:Othell Jaime VAN TRIGT III, MD  OPERATIVE REPORT  DATE OF PROCEDURE:  12/20/2019  OPERATIONS: 1.  Coronary artery bypass grafting x4 (left internal mammary artery to left anterior descending, saphenous vein graft to right coronary artery, saphenous vein graft to obtuse marginal 1, saphenous vein graft to diagonal). 2.  Endoscopic harvest of left leg greater saphenous vein.  SURGEON:  Ivin Poot, MD  ASSISTANT:  Jadene Pierini, PA-C  PREOPERATIVE DIAGNOSIS:  Unstable angina with severe 3-vessel coronary artery disease.  POSTOPERATIVE DIAGNOSIS:  Unstable angina with severe 3-vessel coronary artery disease.  ANESTHESIA:  General by Dr. Suella Broad.  CLINICAL NOTE:  The patient is a 74 year old diabetic who presents with history of progressive chest pain and decreasing exercise tolerance.  After a positive stress test, he underwent cardiac catheterization.  This demonstrated severe 3-vessel coronary  artery disease and he was recommended for coronary bypass surgery.  I saw the patient in consultation after reviewing his coronary angiograms and agreed with the recommendation for CABG.  I discussed the procedure in detail with the patient and wife,  including the expected benefits to include improved survival and relief of symptoms and preservation of LV function, the alternatives to surgical therapy, and the risks involved with surgery.  The patient and wife understood the risks to include stroke,  bleeding, infection, blood transfusion, postoperative organ failure and death.  After reviewing these issues, the patient and wife demonstrated an understanding and agreed to proceed with surgery under what I felt was an informed consent.  OPERATIVE FINDINGS: 1.  Preserved left ventricular function. 2.  No blood products required for the surgery.   3.  Vein was  exposed in the right leg, but not harvested because of its small size.  Adequate vein was harvested from the left leg.  DESCRIPTION OF PROCEDURE:  The patient was brought from preop holding where informed consent was documented and final issues addressed.  The patient was placed supine on the operating table and general anesthesia was induced.  The patient remained  stable.  Because of the patient's history of esophageal stricture, a transesophageal echo probe was not placed as recommended by the anesthesia team.  The patient was prepped and draped as a sterile field.  A proper time-out was performed.  A sternal  incision was made as the saphenous vein was harvested from the left leg.  The left internal mammary artery was harvested as a pedicle graft from its origin at the subclavian vessels.  It was a 1.5 mm vessel with good flow.  The sternal retractor was  placed and the pericardium was opened and suspended.  Pursestrings were placed in the ascending aorta and right atrium.  After heparin was administered and ACT was therapeutic, the patient was cannulated and placed on bypass.  The coronaries were  identified for grafting.  The LAD, diagonal, OM1 and RCA were found to be adequate targets.  The distal circumflex was too small to graft.  Cardioplegia cannulas were placed, both antegrade and retrograde cold blood cardioplegia.  The patient was cooled to 35 degrees and the crossclamp was applied.  One L of cold blood cardioplegia was delivered in split doses between the antegrade aortic and retrograde coronary sinus catheters.  There was good cardioplegic arrest and ____  temperature dropped less than 14 degrees.  Cardioplegia was delivered every 20 minutes.  The distal coronary anastomoses were performed.  First,  distal anastomosis was the distal RCA.  There was a proximal 90% stenosis.  Reverse saphenous vein was sewn end-to-side with running 7-0 Prolene with good flow through the graft.  The second  distal  anastomosis was then placed to the OM branch of the circumflex.  This had a proximal 80% stenosis.  A reverse saphenous vein was sewn end-to-side with running 7-0 Prolene with good flow through the graft.  Cardioplegia was redosed.  The third distal anastomosis was then placed in the diagonal branch of the LAD.  There was an ostial 80% stenosis.  A reverse saphenous vein was sewn end-to-side with running 7-0 Prolene with good flow through the graft.  Cardioplegia was redosed.  The fourth distal anastomosis was the distal third of the LAD.  The LAD had a proximal 90% stenosis.  The left IMA pedicle was brought through an opening in the left lateral pericardium.  It was brought down onto the LAD and sewn end-to-side with running  8-0 Prolene.  There was good flow through the anastomosis after briefly releasing the pedicle bulldog on the mammary artery.  The bulldog was reapplied.  While crossclamp was still in place, 3 proximal vein anastomoses were performed on the ascending aorta using a 4.5 mm punch and running 6-0 Prolene.  Prior to tying down the final proximal anastomosis, air was vented from the coronaries with a dose of  retrograde warm blood cardioplegia.  The crossclamp was removed.  The veins were deaired and opened and each had good flow.  Hemostasis was documented at the proximal and distal sites.  The patient was rewarmed and reperfused.  Temporary pacing wires were applied.  The lungs were reexpanded and the ventilator was  resumed.  The patient was weaned from cardiopulmonary bypass without difficulty.  Cardiac output was normal at 5 L per minute.  Protamine was administered slowly without adverse reaction.  The cannulas were removed.  The mediastinum was irrigated.  Hemostasis was adequate.  The superior pericardial fat was closed over the aorta and vein grafts.  The anterior mediastinum and left  pleural chest tubes were placed and brought out through separate incisions.  The  sternum was closed with interrupted steel wire.  The pectoralis fascia was closed with a running #1 Vicryl.  Subcutaneous and skin layers were closed with running Vicryl and sterile dressings were applied.  The patient was then transferred to ICU in  stable condition.  Total cardiopulmonary bypass time was 120 minutes.  VN/NUANCE  D:12/20/2019 T:12/20/2019 JOB:[id number]

## 2019-12-21 ENCOUNTER — Inpatient Hospital Stay (HOSPITAL_COMMUNITY): Payer: BC Managed Care – PPO

## 2019-12-21 LAB — GLUCOSE, CAPILLARY
Glucose-Capillary: 123 mg/dL — ABNORMAL HIGH (ref 70–99)
Glucose-Capillary: 127 mg/dL — ABNORMAL HIGH (ref 70–99)
Glucose-Capillary: 130 mg/dL — ABNORMAL HIGH (ref 70–99)
Glucose-Capillary: 139 mg/dL — ABNORMAL HIGH (ref 70–99)
Glucose-Capillary: 143 mg/dL — ABNORMAL HIGH (ref 70–99)
Glucose-Capillary: 144 mg/dL — ABNORMAL HIGH (ref 70–99)
Glucose-Capillary: 145 mg/dL — ABNORMAL HIGH (ref 70–99)
Glucose-Capillary: 146 mg/dL — ABNORMAL HIGH (ref 70–99)
Glucose-Capillary: 148 mg/dL — ABNORMAL HIGH (ref 70–99)
Glucose-Capillary: 148 mg/dL — ABNORMAL HIGH (ref 70–99)
Glucose-Capillary: 152 mg/dL — ABNORMAL HIGH (ref 70–99)
Glucose-Capillary: 156 mg/dL — ABNORMAL HIGH (ref 70–99)
Glucose-Capillary: 156 mg/dL — ABNORMAL HIGH (ref 70–99)
Glucose-Capillary: 162 mg/dL — ABNORMAL HIGH (ref 70–99)
Glucose-Capillary: 183 mg/dL — ABNORMAL HIGH (ref 70–99)

## 2019-12-21 LAB — HEPATIC FUNCTION PANEL
ALT: 16 U/L (ref 0–44)
AST: 38 U/L (ref 15–41)
Albumin: 3.1 g/dL — ABNORMAL LOW (ref 3.5–5.0)
Alkaline Phosphatase: 32 U/L — ABNORMAL LOW (ref 38–126)
Bilirubin, Direct: 0.2 mg/dL (ref 0.0–0.2)
Indirect Bilirubin: 1 mg/dL — ABNORMAL HIGH (ref 0.3–0.9)
Total Bilirubin: 1.2 mg/dL (ref 0.3–1.2)
Total Protein: 4.3 g/dL — ABNORMAL LOW (ref 6.5–8.1)

## 2019-12-21 LAB — BASIC METABOLIC PANEL
Anion gap: 8 (ref 5–15)
Anion gap: 10 (ref 5–15)
BUN: 17 mg/dL (ref 8–23)
BUN: 21 mg/dL (ref 8–23)
CO2: 23 mmol/L (ref 22–32)
CO2: 25 mmol/L (ref 22–32)
Calcium: 7.2 mg/dL — ABNORMAL LOW (ref 8.9–10.3)
Calcium: 7.6 mg/dL — ABNORMAL LOW (ref 8.9–10.3)
Chloride: 106 mmol/L (ref 98–111)
Chloride: 101 mmol/L (ref 98–111)
Creatinine, Ser: 1.07 mg/dL (ref 0.61–1.24)
Creatinine, Ser: 1.21 mg/dL (ref 0.61–1.24)
GFR calc Af Amer: >60 mL/min (ref >60)
GFR calc Af Amer: >60 mL/min (ref >60)
GFR calc non Af Amer: >60 mL/min (ref >60)
GFR calc non Af Amer: 59 mL/min — ABNORMAL LOW (ref >60)
Glucose, Bld: 154 mg/dL — ABNORMAL HIGH (ref 70–99)
Glucose, Bld: 159 mg/dL — ABNORMAL HIGH (ref 70–99)
Potassium: 4 mmol/L (ref 3.5–5.1)
Potassium: 3.7 mmol/L (ref 3.5–5.1)
Sodium: 137 mmol/L (ref 135–145)
Sodium: 136 mmol/L (ref 135–145)

## 2019-12-21 LAB — MAGNESIUM: Magnesium: 2.3 mg/dL (ref 1.7–2.4)

## 2019-12-21 LAB — CBC
HCT: 27.9 % — ABNORMAL LOW (ref 39.0–52.0)
HCT: 30 % — ABNORMAL LOW (ref 39.0–52.0)
Hemoglobin: 9.6 g/dL — ABNORMAL LOW (ref 13.0–17.0)
Hemoglobin: 10 g/dL — ABNORMAL LOW (ref 13.0–17.0)
MCH: 30.6 pg (ref 26.0–34.0)
MCH: 30.2 pg (ref 26.0–34.0)
MCHC: 34.4 g/dL (ref 30.0–36.0)
MCHC: 33.3 g/dL (ref 30.0–36.0)
MCV: 88.9 fL (ref 80.0–100.0)
MCV: 90.6 fL (ref 80.0–100.0)
Platelets: 107 10*3/uL — ABNORMAL LOW (ref 150–400)
Platelets: 118 10*3/uL — ABNORMAL LOW (ref 150–400)
RBC: 3.14 MIL/uL — ABNORMAL LOW (ref 4.22–5.81)
RBC: 3.31 MIL/uL — ABNORMAL LOW (ref 4.22–5.81)
RDW: 12.4 % (ref 11.5–15.5)
RDW: 12.7 % (ref 11.5–15.5)
WBC: 9.9 10*3/uL (ref 4.0–10.5)
WBC: 14.8 10*3/uL — ABNORMAL HIGH (ref 4.0–10.5)
nRBC: 0 % (ref 0.0–0.2)
nRBC: 0 % (ref 0.0–0.2)

## 2019-12-21 MED ORDER — INFLUENZA VAC A&B SA ADJ QUAD 0.5 ML IM PRSY
0.5000 mL | PREFILLED_SYRINGE | INTRAMUSCULAR | Status: DC | PRN
  Filled 2019-12-21: qty 0.5

## 2019-12-21 MED ORDER — INSULIN ASPART 100 UNIT/ML ~~LOC~~ SOLN
0.0000 [IU] | SUBCUTANEOUS | Status: DC
  Administered 2019-12-21: 4 [IU] via SUBCUTANEOUS
  Administered 2019-12-21 – 2019-12-22 (×4): 2 [IU] via SUBCUTANEOUS
  Administered 2019-12-22 (×2): 4 [IU] via SUBCUTANEOUS
  Administered 2019-12-22: 12 [IU] via SUBCUTANEOUS
  Administered 2019-12-23: 4 [IU] via SUBCUTANEOUS

## 2019-12-21 MED ORDER — ENOXAPARIN SODIUM 30 MG/0.3ML ~~LOC~~ SOLN
30.0000 mg | Freq: Every day | SUBCUTANEOUS | Status: DC
  Administered 2019-12-21 – 2019-12-26 (×6): 30 mg via SUBCUTANEOUS
  Filled 2019-12-21 (×6): qty 0.3

## 2019-12-21 MED ORDER — THIAMINE HCL 100 MG PO TABS
100.0000 mg | ORAL_TABLET | Freq: Every day | ORAL | Status: DC
  Administered 2019-12-21 – 2019-12-27 (×7): 100 mg via ORAL
  Filled 2019-12-21 (×7): qty 1

## 2019-12-21 MED ORDER — INSULIN DETEMIR 100 UNIT/ML ~~LOC~~ SOLN
10.0000 [IU] | Freq: Once | SUBCUTANEOUS | Status: AC
  Administered 2019-12-21: 11:00:00 10 [IU] via SUBCUTANEOUS
  Filled 2019-12-21: qty 0.1

## 2019-12-21 MED ORDER — INSULIN DETEMIR 100 UNIT/ML ~~LOC~~ SOLN
10.0000 [IU] | Freq: Every day | SUBCUTANEOUS | Status: DC
  Administered 2019-12-22 – 2019-12-24 (×3): 10 [IU] via SUBCUTANEOUS
  Filled 2019-12-21 (×4): qty 0.1

## 2019-12-21 NOTE — Progress Notes (Signed)
EVENING ROUNDS NOTE :     [address]       [place] [address]             [phone]                 1 Day Post-Op Procedure(s) (LRB): CORONARY ARTERY BYPASS GRAFTING (CABG) times four, using left internal mammary artery and left greater saphenous vein (N/A) TRANSESOPHAGEAL ECHOCARDIOGRAM (TEE) (N/A)  Total Length of Stay:  LOS: 1 day  BP (!) 118/95   Pulse 82   Temp 100 F (37.8 C) (Oral)   Resp 13   Ht 5\' 10"  (1.778 m)   Wt 72.3 kg   SpO2 98%   BMI 22.87 kg/m   .Intake/Output      01/15 0701 - 01/16 0700 01/16 0701 - 01/17 0700   P.O.  240   I.V. (mL/kg) 3643.6 (50.4) 106.4 (1.5)   Blood 308    IV Piggyback 1450.4 300.1   Total Intake(mL/kg) 5402 (74.7) 646.4 (8.9)   Urine (mL/kg/hr) 2405 (1.4) 330 (0.4)   Emesis/NG output  0   Blood 600    Chest Tube 524 260   Total Output 3529 590   Net +1873 +56.4        Emesis Occurrence  1 x     . cefUROXime (ZINACEF)  IV Stopped (12/21/19 1135)  . lactated ringers    . lactated ringers    . milrinone 0.25 mcg/kg/min (12/21/19 1800)  . niCARDipine    . nitroGLYCERIN    . phenylephrine (NEO-SYNEPHRINE) Adult infusion Stopped (12/21/19 0609)     Lab Results  Component Value Date   WBC 14.8 (H) 12/21/2019   HGB 10.0 (L) 12/21/2019   HCT 30.0 (L) 12/21/2019   PLT 118 (L) 12/21/2019   GLUCOSE 159 (H) 12/21/2019   CHOL 234 (H) 12/15/2019   TRIG 182 (H) 12/15/2019   HDL 49 12/15/2019   LDLCALC 149 (H) 12/15/2019   ALT 16 12/21/2019   AST 38 12/21/2019   NA 136 12/21/2019   K 3.7 12/21/2019   CL 101 12/21/2019   CREATININE 1.21 12/21/2019   BUN 21 12/21/2019   CO2 25 12/21/2019   INR 1.7 (H) 12/20/2019   HGBA1C 7.4 (H) 12/18/2019    Up to chair  Stable day Found to be in AFIB On loprssor bid   Tyler Isaac MD  Beeper [phone] Office [phone] 12/21/2019 6:46 PM

## 2019-12-21 NOTE — Progress Notes (Signed)
Patient ID: [name], male   DOB: November 14, 1946, 74 y.o.   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, using left internal mammary artery and left greater saphenous vein (N/A) TRANSESOPHAGEAL ECHOCARDIOGRAM (TEE) (N/A)  Total Length of Stay:  LOS: 1 day   Subjective: , Patient extubated last night without difficulty, he is awake alert neurologically intact  Objective: Vital signs in last 24 hours: Temp:  [96.8 F (36 C)-100.6 F (38.1 C)] 100 F (37.8 C) (01/16 0800) Pulse Rate:  [79-97] 88 (01/16 0800) Cardiac Rhythm: Normal sinus rhythm (01/16 0800) Resp:  [12-27] 21 (01/16 0800) BP: (83-110)/(52-76) 102/62 (01/16 0800) SpO2:  [95 %-100 %] 97 % (01/16 0800) Arterial Line BP: (87-161)/(48-82) 117/55 (01/16 0800) FiO2 (%):  [40 %-50 %] 40 % (01/15 1727) Weight:  [72.3 kg] 72.3 kg (01/16 0445)  Filed Weights   12/20/19 0604 12/21/19 0445  Weight: 71.7 kg 72.3 kg    Weight change: 0.632 kg   Hemodynamic parameters for last 24 hours: PAP: (16-34)/(10-22) 28/15 CVP:  [0 mmHg-16 mmHg] 11 mmHg CO:  [2.3 L/min-5.5 L/min] 5.5 L/min CI:  [2.3 L/min/m2-4.4 L/min/m2] 2.9 L/min/m2  Intake/Output from previous day: 01/15 0701 - 01/16 0700 In: 5402 [I.V.:3643.6; Blood:308; IV Piggyback:1450.4] Out: 3529 [Urine:2405; Blood:600; Chest Tube:524]  Intake/Output this shift: Total I/O In: 51.8 [I.V.:51.8] Out: -   Current Meds: Scheduled Meds: . acetaminophen  1,000 mg Oral Q6H   Or  . acetaminophen (TYLENOL) oral liquid 160 mg/5 mL  1,000 mg Per Tube Q6H  . bisacodyl  10 mg Oral Daily   Or  . bisacodyl  10 mg Rectal Daily  . Chlorhexidine Gluconate Cloth  6 each Topical Daily  . docusate sodium  200 mg Oral Daily  . mouth rinse  15 mL Mouth Rinse BID  . metoprolol tartrate  12.5 mg  Oral BID   Or  . metoprolol tartrate  12.5 mg Per Tube BID  . [START ON 12/22/2019] pantoprazole  40 mg Oral Daily  . sodium chloride flush  3 mL Intravenous Q12H  . STUDY - [facility] - [id number] or placebo 100 mg in dextrose 5% 100 mL (1 mg/mL) IV infusion (PI-Owen)  400 mL/hr Intravenous Q24H  . tamsulosin  0.4 mg Oral QPC supper  . thiamine  100 mg Intravenous Daily   Continuous Infusions: . sodium chloride 20 mL/hr at 12/21/19 0800  . sodium chloride    . sodium chloride 10 mL/hr (12/20/19 1130)  . albumin human 12.5 g (12/20/19 2226)  . cefUROXime (ZINACEF)  IV Stopped (12/20/19 2251)  . dexmedetomidine (PRECEDEX) IV infusion Stopped (12/21/19 0437)  . insulin 0.8 Units/hr (12/21/19 0606)  . lactated ringers    . lactated ringers    . milrinone 0.25 mcg/kg/min (12/21/19 0800)  . niCARDipine    . nitroGLYCERIN    . phenylephrine (NEO-SYNEPHRINE) Adult infusion Stopped (12/21/19 [id number])  . vancomycin 1,000 mg (12/21/19 0802)   PRN Meds:.sodium chloride, albumin human, dextrose, fentaNYL (SUBLIMAZE) injection, metoprolol tartrate, midazolam, ondansetron (ZOFRAN) IV, sodium chloride flush  General appearance: alert, cooperative and no distress Neurologic: intact Heart: regular rate and rhythm, S1, S2 normal, no murmur,  click, rub or gallop Lungs: clear to auscultation bilaterally Abdomen: soft, non-tender; bowel sounds normal; no masses,  no organomegaly Extremities: extremities normal, atraumatic, no cyanosis or edema and Homans sign is negative, no sign of DVT Wound: Dressings intact sternum stable  Lab Results: CBC: Recent Labs    12/20/19 1944 12/21/19 0318  WBC 12.1* 9.9  HGB 10.7* 9.6*  HCT 31.1* 27.9*  PLT 129* 107*   BMET:  Recent Labs    12/20/19 1944 12/21/19 0318  NA 139 137  K 4.0 4.0  CL 109 106  CO2 23 23  GLUCOSE 154* 154*  BUN 14 17  CREATININE 1.08 1.07  CALCIUM 7.2* 7.2*    CMET: Lab Results  Component Value Date   WBC 9.9 12/21/2019   HGB  9.6 (L) 12/21/2019   HCT 27.9 (L) 12/21/2019   PLT 107 (L) 12/21/2019   GLUCOSE 154 (H) 12/21/2019   CHOL 234 (H) 12/15/2019   TRIG 182 (H) 12/15/2019   HDL 49 12/15/2019   LDLCALC 149 (H) 12/15/2019   ALT 16 12/21/2019   AST 38 12/21/2019   NA 137 12/21/2019   K 4.0 12/21/2019   CL 106 12/21/2019   CREATININE 1.07 12/21/2019   BUN 17 12/21/2019   CO2 23 12/21/2019   INR 1.7 (H) 12/20/2019   HGBA1C 7.4 (H) 12/18/2019      PT/INR:  Recent Labs    12/20/19 1349  LABPROT 19.4*  INR 1.7*   Radiology: [facility] Chest Port 1 View  Result Date: 12/20/2019 CLINICAL DATA:  74 year old male status post recent median sternotomy. Postoperative evaluation. EXAM: PORTABLE CHEST 1 VIEW COMPARISON:  Chest x-ray 12/18/2019. FINDINGS: An endotracheal tube is in place with tip 3.5 cm above the carina. Right internal jugular Cordis through which a Swan-Ganz catheter has been passed to the right main pulmonary artery. A nasogastric tube is seen extending into the stomach, however, the tip of the nasogastric tube extends below the lower margin of the image. Left-sided chest tube in place with lead tip projecting over the lateral aspect of the left mid hemithorax. Lung volumes are low. No appreciable pneumothorax. No acute consolidative airspace disease. No pleural effusions. No evidence of pulmonary edema. Cardiopericardial silhouette is within normal limits. Upper mediastinal contours are normal. Aortic atherosclerosis. Status post median sternotomy for CABG. IMPRESSION: 1. Postoperative changes and support apparatus, as above. 2. Low lung volumes without radiographic evidence of acute cardiopulmonary disease. Electronically Signed   By: Vinnie Langton M.D.   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

## 2019-12-21 NOTE — Research (Addendum)
[place] Visit 3 labs and urine collected @ [place] infusion given @ 1202  EOI labs collected @ [place]   [x]    No change or change not clinically significant. NO FOLLOW UP REQUIRED. []    Change clinically significant and attributable to disease or management. NO FOLLOW UP REQUIRED. []    Change clinically significant and possibly attributable to study medication. NO FOLLOW UP REQUIRED. []    Change clinically significant and attributable to study medication. FOLLOW UP REQUIRED. []    Apparent lab error. []    Unevaluable.

## 2019-12-21 NOTE — Plan of Care (Signed)
  Problem: Education: Goal: Knowledge of General Education information will improve Description Including pain rating scale, medication(s)/side effects and non-pharmacologic comfort measures Outcome: Progressing   Problem: Health Behavior/Discharge Planning: Goal: Ability to manage health-related needs will improve Outcome: Progressing   Problem: Clinical Measurements: Goal: Ability to maintain clinical measurements within normal limits will improve Outcome: Progressing Goal: Will remain free from infection Outcome: Progressing Goal: Diagnostic test results will improve Outcome: Progressing Goal: Respiratory complications will improve Outcome: Progressing Goal: Cardiovascular complication will be avoided Outcome: Progressing   Problem: Activity: Goal: Risk for activity intolerance will decrease Outcome: Progressing   Problem: Nutrition: Goal: Adequate nutrition will be maintained Outcome: Progressing   Problem: Coping: Goal: Level of anxiety will decrease Outcome: Progressing   Problem: Elimination: Goal: Will not experience complications related to bowel motility Outcome: Progressing Goal: Will not experience complications related to urinary retention Outcome: Progressing   Problem: Safety: Goal: Ability to remain free from injury will improve Outcome: Progressing   Problem: Skin Integrity: Goal: Risk for impaired skin integrity will decrease Outcome: Progressing   Problem: Education: Goal: Will demonstrate proper wound care and an understanding of methods to prevent future damage Outcome: Progressing Goal: Knowledge of disease or condition will improve Outcome: Progressing Goal: Knowledge of the prescribed therapeutic regimen will improve Outcome: Progressing Goal: Individualized Educational Video(s) Outcome: Progressing   Problem: Activity: Goal: Risk for activity intolerance will decrease Outcome: Progressing   Problem: Cardiac: Goal: Will achieve  and/or maintain hemodynamic stability Outcome: Progressing   Problem: Clinical Measurements: Goal: Postoperative complications will be avoided or minimized Outcome: Progressing   Problem: Respiratory: Goal: Respiratory status will improve Outcome: Progressing   Problem: Skin Integrity: Goal: Wound healing without signs and symptoms of infection Outcome: Progressing Goal: Risk for impaired skin integrity will decrease Outcome: Progressing   Problem: Urinary Elimination: Goal: Ability to achieve and maintain adequate renal perfusion and functioning will improve Outcome: Progressing   

## 2019-12-22 ENCOUNTER — Inpatient Hospital Stay (HOSPITAL_COMMUNITY): Payer: BC Managed Care – PPO

## 2019-12-22 LAB — GLUCOSE, CAPILLARY
Glucose-Capillary: 124 mg/dL — ABNORMAL HIGH (ref 70–99)
Glucose-Capillary: 163 mg/dL — ABNORMAL HIGH (ref 70–99)
Glucose-Capillary: 174 mg/dL — ABNORMAL HIGH (ref 70–99)
Glucose-Capillary: 291 mg/dL — ABNORMAL HIGH (ref 70–99)
Glucose-Capillary: 53 mg/dL — ABNORMAL LOW (ref 70–99)
Glucose-Capillary: 62 mg/dL — ABNORMAL LOW (ref 70–99)
Glucose-Capillary: 78 mg/dL (ref 70–99)
Glucose-Capillary: 96 mg/dL (ref 70–99)

## 2019-12-22 LAB — CBC
HCT: 28.2 % — ABNORMAL LOW (ref 39.0–52.0)
Hemoglobin: 9.3 g/dL — ABNORMAL LOW (ref 13.0–17.0)
MCH: 30.4 pg (ref 26.0–34.0)
MCHC: 33 g/dL (ref 30.0–36.0)
MCV: 92.2 fL (ref 80.0–100.0)
Platelets: 97 10*3/uL — ABNORMAL LOW (ref 150–400)
RBC: 3.06 MIL/uL — ABNORMAL LOW (ref 4.22–5.81)
RDW: 12.9 % (ref 11.5–15.5)
WBC: 12.4 10*3/uL — ABNORMAL HIGH (ref 4.0–10.5)
nRBC: 0 % (ref 0.0–0.2)

## 2019-12-22 LAB — BASIC METABOLIC PANEL
Anion gap: 7 (ref 5–15)
BUN: 20 mg/dL (ref 8–23)
CO2: 26 mmol/L (ref 22–32)
Calcium: 7.6 mg/dL — ABNORMAL LOW (ref 8.9–10.3)
Chloride: 103 mmol/L (ref 98–111)
Creatinine, Ser: 1.1 mg/dL (ref 0.61–1.24)
GFR calc Af Amer: >60 mL/min (ref >60)
GFR calc non Af Amer: >60 mL/min (ref >60)
Glucose, Bld: 104 mg/dL — ABNORMAL HIGH (ref 70–99)
Potassium: 3.7 mmol/L (ref 3.5–5.1)
Sodium: 136 mmol/L (ref 135–145)

## 2019-12-22 LAB — COOXEMETRY PANEL
Carboxyhemoglobin: 1 % (ref 0.5–1.5)
Methemoglobin: 0.6 % (ref 0.0–1.5)
O2 Saturation: 54.1 %
Total hemoglobin: 9.4 g/dL — ABNORMAL LOW (ref 12.0–16.0)

## 2019-12-22 MED ORDER — POTASSIUM CHLORIDE CRYS ER 20 MEQ PO TBCR
20.0000 meq | EXTENDED_RELEASE_TABLET | ORAL | Status: AC
  Administered 2019-12-22 (×3): 20 meq via ORAL
  Filled 2019-12-22 (×3): qty 1

## 2019-12-22 MED ORDER — FUROSEMIDE 10 MG/ML IJ SOLN
40.0000 mg | Freq: Once | INTRAMUSCULAR | Status: AC
  Administered 2019-12-22: 40 mg via INTRAVENOUS
  Filled 2019-12-22: qty 4

## 2019-12-22 NOTE — Progress Notes (Signed)
Patient ID: [name], male   DOB: Feb 27, 1946, 74 y.o.   MRN: [id number] EVENING ROUNDS NOTE :     [address]       [place] [address]             [phone]                 2 Days Post-Op Procedure(s) (LRB): CORONARY ARTERY BYPASS GRAFTING (CABG) times four, using left internal mammary artery and left greater saphenous vein (N/A) TRANSESOPHAGEAL ECHOCARDIOGRAM (TEE) (N/A)  Total Length of Stay:  LOS: 2 days  BP (!) 109/55   Pulse 94   Temp 99.3 F (37.4 C) (Oral)   Resp (!) 26   Ht 5\' 10"  (1.778 m)   Wt 74.1 kg Comment: per standing scale  SpO2 94%   BMI 23.44 kg/m   .Intake/Output      01/16 0701 - 01/17 0700 01/17 0701 - 01/18 0700   P.O. 300 1320   I.V. (mL/kg) 173.9 (2.3) 40.7 (0.5)   Blood     IV Piggyback 400.1 125.3   Total Intake(mL/kg) 874.1 (11.8) 1485.9 (20.1)   Urine (mL/kg/hr) 640 (0.4) 625 (0.8)   Emesis/NG output 0    Blood     Chest Tube 560 130   Total Output 1200 755   Net -325.9 +730.9        Urine Occurrence  2 x   Emesis Occurrence 1 x      . lactated ringers    . lactated ringers    . milrinone 0.125 mcg/kg/min (12/22/19 1800)  . nitroGLYCERIN    . phenylephrine (NEO-SYNEPHRINE) Adult infusion Stopped (12/21/19 0609)     Lab Results  Component Value Date   WBC 12.4 (H) 12/22/2019   HGB 9.3 (L) 12/22/2019   HCT 28.2 (L) 12/22/2019   PLT 97 (L) 12/22/2019   GLUCOSE 104 (H) 12/22/2019   CHOL 234 (H) 12/15/2019   TRIG 182 (H) 12/15/2019   HDL 49 12/15/2019   LDLCALC 149 (H) 12/15/2019   ALT 16 12/21/2019   AST 38 12/21/2019   NA 136 12/22/2019   K 3.7 12/22/2019   CL 103 12/22/2019   CREATININE 1.10 12/22/2019   BUN 20 12/22/2019   CO2 26 12/22/2019   INR 1.7 (H) 12/20/2019   HGBA1C 7.4 (H) 12/18/2019   Stable day Tolerated decreased milrinone. Cox 54 on .125 Continue at current rate    [name] MD  Beeper [phone] Office [phone] 12/22/2019 6:13 PM

## 2019-12-22 NOTE — Progress Notes (Signed)
Hypoglycemic Event  CBG:53, 63   Treatment: 12 oz orange juice  Symptoms:pt denies pain, shortness of breath, lethargy, weakness, no complaints or concerns voiced at this time  Follow-up CBG: Time:2343 CBG Result: `78   Comments/MD notified:    Goldie Tregoning Cammie Mcgee

## 2019-12-22 NOTE — Research (Addendum)
[place] Study  Visit 4 Labs and urine collected @ 1210    [place] infusion # 3 administered @1217   EOI labs collected @ 1238  2-4 hour labs collected @ 1435  ABNORMAL LABS   [x]    No change or change not clinically significant. NO FOLLOW UP REQUIRED. []    Change clinically significant and attributable to disease or management. NO FOLLOW UP REQUIRED. []    Change clinically significant and possibly attributable to study medication. NO FOLLOW UP REQUIRED. []    Change clinically significant and attributable to study medication. FOLLOW UP REQUIRED. []    Apparent lab error. []    Unevaluable.

## 2019-12-22 NOTE — Progress Notes (Signed)
Patient ID: [name], male   DOB: [birth year], 74 y.o.   MRN: [id number] TCTS DAILY ICU PROGRESS NOTE                   [name][address]            [name] [address]          [phone]   2 Days Post-Op Procedure(s) (LRB): CORONARY ARTERY BYPASS GRAFTING (CABG) times four, using left internal mammary artery and left greater saphenous vein (N/A) TRANSESOPHAGEAL ECHOCARDIOGRAM (TEE) (N/A)  Total Length of Stay:  LOS: 2 days   Subjective: Patient up in chair feels well   Objective: Vital signs in last 24 hours: Temp:  [98.7 F (37.1 C)-100 F (37.8 C)] 99.2 F (37.3 C) (01/17 0742) Pulse Rate:  [70-96] 88 (01/17 0820) Cardiac Rhythm: Atrial fibrillation (01/17 0400) Resp:  [13-27] 24 (01/17 0820) BP: (98-122)/(52-95) 110/60 (01/17 0820) SpO2:  [78 %-100 %] 97 % (01/17 0820) Arterial Line BP: (130)/(54) 130/54 (01/16 0900) Weight:  [74.1 kg] 74.1 kg (01/17 0500)  Filed Weights   12/20/19 0604 12/21/19 0445 12/22/19 0500  Weight: 71.7 kg 72.3 kg 74.1 kg    Weight change: 1.8 kg   Hemodynamic parameters for last 24 hours: PAP: (30)/(17) 30/17 CVP:  [16 mmHg] 16 mmHg  Intake/Output from previous day: 01/16 0701 - 01/17 0700 In: 874.1 [P.O.:300; I.V.:173.9; IV Piggyback:400.1] Out: 1200 [Urine:640; Chest Tube:560]  Intake/Output this shift: Total I/O In: -  Out: 110 [Chest Tube:110]  Current Meds: Scheduled Meds: . acetaminophen  1,000 mg Oral Q6H   Or  . acetaminophen (TYLENOL) oral liquid 160 mg/5 mL  1,000 mg Per Tube Q6H  . bisacodyl  10 mg Oral Daily   Or  . bisacodyl  10 mg Rectal Daily  . Chlorhexidine Gluconate Cloth  6 each Topical Daily  . docusate sodium  200 mg Oral Daily  . enoxaparin (LOVENOX) injection  30 mg Subcutaneous QHS  . insulin aspart  0-24 Units Subcutaneous Q4H  . insulin detemir  10 Units Subcutaneous Daily  . mouth rinse  15 mL Mouth Rinse BID  . metoprolol tartrate  12.5 mg Oral BID   Or  . metoprolol  tartrate  12.5 mg Per Tube BID  . pantoprazole  40 mg Oral Daily  . potassium chloride  20 mEq Oral Q4H  . sodium chloride flush  3 mL Intravenous Q12H  . STUDY - ASTELLAS - [id number] or placebo 100 mg in dextrose 5% 100 mL (1 mg/mL) IV infusion (PI-Owen)  400 mL/hr Intravenous Q24H  . tamsulosin  0.4 mg Oral QPC supper  . thiamine  100 mg Oral Daily   Continuous Infusions: . cefUROXime (ZINACEF)  IV Stopped (12/21/19 2322)  . lactated ringers    . lactated ringers    . milrinone 0.25 mcg/kg/min (12/22/19 0600)  . niCARDipine    . nitroGLYCERIN    . phenylephrine (NEO-SYNEPHRINE) Adult infusion Stopped (12/21/19 0609)   PRN Meds:.fentaNYL (SUBLIMAZE) injection, influenza vaccine adjuvanted, metoprolol tartrate, ondansetron (ZOFRAN) IV, sodium chloride flush  General appearance: alert, cooperative and no distress Neurologic: intact Heart: regular rate and rhythm, S1, S2 normal, no murmur, click, rub or gallop Lungs: diminished breath sounds bibasilar Abdomen: soft, non-tender; bowel sounds normal; no masses,  no organomegaly Extremities: extremities normal, atraumatic, no cyanosis or edema and Homans sign is negative, no sign of DVT Wound: Sternum stable  Lab Results: CBC: Recent Labs    12/21/19 1636  12/22/19 0309  WBC 14.8* 12.4*  HGB 10.0* 9.3*  HCT 30.0* 28.2*  PLT 118* 97*   BMET:  Recent Labs    12/21/19 1636 12/22/19 0309  NA 136 136  K 3.7 3.7  CL 101 103  CO2 25 26  GLUCOSE 159* 104*  BUN 21 20  CREATININE 1.21 1.10  CALCIUM 7.6* 7.6*    CMET: Lab Results  Component Value Date   WBC 12.4 (H) 12/22/2019   HGB 9.3 (L) 12/22/2019   HCT 28.2 (L) 12/22/2019   PLT 97 (L) 12/22/2019   GLUCOSE 104 (H) 12/22/2019   CHOL 234 (H) 12/15/2019   TRIG 182 (H) 12/15/2019   HDL 49 12/15/2019   LDLCALC 149 (H) 12/15/2019   ALT 16 12/21/2019   AST 38 12/21/2019   NA 136 12/22/2019   K 3.7 12/22/2019   CL 103 12/22/2019   CREATININE 1.10 12/22/2019   BUN 20  12/22/2019   CO2 26 12/22/2019   INR 1.7 (H) 12/20/2019   HGBA1C 7.4 (H) 12/18/2019      PT/INR:  Recent Labs    12/20/19 1349  LABPROT 19.4*  INR 1.7*   Radiology: [facility] Chest Port 1 View  Result Date: 12/22/2019 CLINICAL DATA:  Encounter for post-op check. CABG x 4 on 12/20/19. EXAM: PORTABLE CHEST 1 VIEW COMPARISON:  12/21/2019 FINDINGS: Retraction of Swan-Ganz catheter. IJ sheath remains. LEFT chest tube in place. No pneumothorax. LEFT pleural effusion noted. Lungs are clear. IMPRESSION: 1. LEFT chest tube in place without pneumothorax. 2. LEFT pleural effusion. 3. Lungs are clear otherwise. Electronically Signed   By: [name] M.D.   On: 12/22/2019 08:30     Assessment/Plan: S/P Procedure(s) (LRB): CORONARY ARTERY BYPASS GRAFTING (CABG) times four, using left internal mammary artery and left greater saphenous vein (N/A) TRANSESOPHAGEAL ECHOCARDIOGRAM (TEE) (N/A) Mobilize Diuresis Diabetes control d/c tubes/lines Weaning milrinone down Renal function stable   [name] 12/22/2019 8:55 AM

## 2019-12-23 ENCOUNTER — Inpatient Hospital Stay (HOSPITAL_COMMUNITY): Payer: BC Managed Care – PPO

## 2019-12-23 ENCOUNTER — Encounter: Payer: Self-pay | Admitting: *Deleted

## 2019-12-23 LAB — BASIC METABOLIC PANEL
Anion gap: 8 (ref 5–15)
BUN: 23 mg/dL (ref 8–23)
CO2: 25 mmol/L (ref 22–32)
Calcium: 7.6 mg/dL — ABNORMAL LOW (ref 8.9–10.3)
Chloride: 105 mmol/L (ref 98–111)
Creatinine, Ser: 1.2 mg/dL (ref 0.61–1.24)
GFR calc Af Amer: >60 mL/min (ref >60)
GFR calc non Af Amer: 59 mL/min — ABNORMAL LOW (ref >60)
Glucose, Bld: 97 mg/dL (ref 70–99)
Potassium: 3.6 mmol/L (ref 3.5–5.1)
Sodium: 138 mmol/L (ref 135–145)

## 2019-12-23 LAB — CBC
HCT: 27.8 % — ABNORMAL LOW (ref 39.0–52.0)
Hemoglobin: 9.3 g/dL — ABNORMAL LOW (ref 13.0–17.0)
MCH: 30.3 pg (ref 26.0–34.0)
MCHC: 33.5 g/dL (ref 30.0–36.0)
MCV: 90.6 fL (ref 80.0–100.0)
Platelets: 103 10*3/uL — ABNORMAL LOW (ref 150–400)
RBC: 3.07 MIL/uL — ABNORMAL LOW (ref 4.22–5.81)
RDW: 12.8 % (ref 11.5–15.5)
WBC: 10.4 10*3/uL (ref 4.0–10.5)
nRBC: 0 % (ref 0.0–0.2)

## 2019-12-23 LAB — COOXEMETRY PANEL
Carboxyhemoglobin: 1.7 % — ABNORMAL HIGH (ref 0.5–1.5)
Methemoglobin: 1.1 % (ref 0.0–1.5)
O2 Saturation: 72.7 %
Total hemoglobin: 9 g/dL — ABNORMAL LOW (ref 12.0–16.0)

## 2019-12-23 LAB — GLUCOSE, CAPILLARY
Glucose-Capillary: 90 mg/dL (ref 70–99)
Glucose-Capillary: 167 mg/dL — ABNORMAL HIGH (ref 70–99)
Glucose-Capillary: 180 mg/dL — ABNORMAL HIGH (ref 70–99)
Glucose-Capillary: 114 mg/dL — ABNORMAL HIGH (ref 70–99)
Glucose-Capillary: 134 mg/dL — ABNORMAL HIGH (ref 70–99)

## 2019-12-23 MED ORDER — SODIUM CHLORIDE 0.9% FLUSH: 3.0000 mL | INTRAVENOUS | Status: DC | PRN

## 2019-12-23 MED ORDER — SODIUM CHLORIDE 0.9% FLUSH
3.0000 mL | Freq: Two times a day (BID) | INTRAVENOUS | Status: DC
  Administered 2019-12-23 – 2019-12-25 (×5): 3 mL via INTRAVENOUS

## 2019-12-23 MED ORDER — POTASSIUM CHLORIDE CRYS ER 20 MEQ PO TBCR
20.0000 meq | EXTENDED_RELEASE_TABLET | ORAL | Status: DC
  Administered 2019-12-23: 20 meq via ORAL
  Filled 2019-12-23: qty 1

## 2019-12-23 MED ORDER — ~~LOC~~ CARDIAC SURGERY, PATIENT & FAMILY EDUCATION: Freq: Once | Status: DC

## 2019-12-23 MED ORDER — INSULIN ASPART 100 UNIT/ML ~~LOC~~ SOLN
0.0000 [IU] | Freq: Three times a day (TID) | SUBCUTANEOUS | Status: DC
  Administered 2019-12-23: 2 [IU] via SUBCUTANEOUS
  Administered 2019-12-23: 4 [IU] via SUBCUTANEOUS
  Administered 2019-12-24: 12:00:00 2 [IU] via SUBCUTANEOUS
  Administered 2019-12-24: 4 [IU] via SUBCUTANEOUS
  Administered 2019-12-24: 10:00:00 8 [IU] via SUBCUTANEOUS
  Administered 2019-12-25: 12 [IU] via SUBCUTANEOUS
  Administered 2019-12-25: 13:00:00 4 [IU] via SUBCUTANEOUS
  Administered 2019-12-26: 17:00:00 2 [IU] via SUBCUTANEOUS
  Administered 2019-12-26 – 2019-12-27 (×2): 4 [IU] via SUBCUTANEOUS

## 2019-12-23 MED ORDER — POTASSIUM CHLORIDE CRYS ER 20 MEQ PO TBCR
20.0000 meq | EXTENDED_RELEASE_TABLET | Freq: Two times a day (BID) | ORAL | Status: DC
  Administered 2019-12-23 – 2019-12-27 (×9): 20 meq via ORAL
  Filled 2019-12-23 (×9): qty 1

## 2019-12-23 MED ORDER — SODIUM CHLORIDE 0.9 % IV SOLN: 250.0000 mL | INTRAVENOUS | Status: DC | PRN

## 2019-12-23 MED ORDER — MAGNESIUM HYDROXIDE 400 MG/5ML PO SUSP: 30.0000 mL | Freq: Every day | ORAL | Status: DC | PRN

## 2019-12-23 MED ORDER — SORBITOL 70 % SOLN
30.0000 mL | Freq: Every morning | Status: AC
  Administered 2019-12-23 – 2019-12-24 (×2): 30 mL via ORAL
  Filled 2019-12-23 (×2): qty 30

## 2019-12-23 MED ORDER — FUROSEMIDE 10 MG/ML IJ SOLN
40.0000 mg | Freq: Every day | INTRAMUSCULAR | Status: DC
  Administered 2019-12-23 – 2019-12-25 (×3): 40 mg via INTRAVENOUS
  Filled 2019-12-23 (×5): qty 4

## 2019-12-23 NOTE — Progress Notes (Signed)
Patient ID: [name], male   DOB: [birth year], 74 y.o.   MRN: [id number] TCTS Evening Rounds:  Hemodynamically stable off [place] Atrial fib 90's  Urine output ok Bowels working. Awaiting bed on 4E.

## 2019-12-23 NOTE — Research (Addendum)
[place] Study  Visit 5 labs and urine collected @ 09:30.  ABNORMAL LABS   [x]    No change or change not clinically significant. NO FOLLOW UP REQUIRED. []    Change clinically significant and attributable to disease or management. NO FOLLOW UP REQUIRED. []    Change clinically significant and possibly attributable to study medication. NO FOLLOW UP REQUIRED. []    Change clinically significant and attributable to study medication. FOLLOW UP REQUIRED. []    Apparent lab error. []    Unevaluable.

## 2019-12-23 NOTE — Plan of Care (Signed)
Pt is alert and oriented, on room air, breathing is even and unlabored. Pt ambulated in the hall 5 times, tolerates activity well. Pt has denied pain throughout the night. Pt is able to use the urinal, adequate urine output noticed. Pt's heart rate continues AFIB, rate controlled in the 80s to 90s. No complaints or concerns voiced at this time. Pt up in chair, call bell is within reach. Problem: Education: Goal: Knowledge of General Education information will improve Description: Including pain rating scale, medication(s)/side effects and non-pharmacologic comfort measures Outcome: Progressing   Problem: Clinical Measurements: Goal: Ability to maintain clinical measurements within normal limits will improve Outcome: Progressing   Problem: Activity: Goal: Risk for activity intolerance will decrease Outcome: Progressing   Problem: Elimination: Goal: Will not experience complications related to bowel motility Outcome: Progressing   Problem: Pain Managment: Goal: General experience of comfort will improve Outcome: Progressing   Problem: Safety: Goal: Ability to remain free from injury will improve Outcome: Progressing   Problem: Respiratory: Goal: Respiratory status will improve Outcome: Progressing   Problem: Urinary Elimination: Goal: Ability to achieve and maintain adequate renal perfusion and functioning will improve Outcome: Progressing

## 2019-12-23 NOTE — Progress Notes (Signed)
3 Days Post-Op Procedure(s) (LRB): CORONARY ARTERY BYPASS GRAFTING (CABG) times four, using left internal mammary artery and left greater saphenous vein (N/A) TRANSESOPHAGEAL ECHOCARDIOGRAM (TEE) (N/A) Subjective: Doing well after CABG Rate controlled Afib [ preop hx of PAF] Ready for tx to stepdown Objective: Vital signs in last 24 hours: Temp:  [98.7 F (37.1 C)-99.3 F (37.4 C)] 98.8 F (37.1 C) (01/18 0819) Pulse Rate:  [70-121] 108 (01/18 0700) Cardiac Rhythm: Atrial fibrillation (01/18 0400) Resp:  [16-28] 18 (01/18 0700) BP: (98-155)/(51-101) 117/59 (01/18 0700) SpO2:  [81 %-100 %] 97 % (01/18 0700) Weight:  [74 kg] 74 kg (01/18 0500)  Hemodynamic parameters for last 24 hours:  stable  Intake/Output from previous day: 01/17 0701 - 01/18 0700 In: 2061 [P.O.:1860; I.V.:75.7; IV Piggyback:125.3] Out: [id number] [Urine:1425; Chest Tube:130] Intake/Output this shift: No intake/output data recorded.       Exam    General- alert and comfortable    Incision clean, dry    Neck- no JVD, no cervical adenopathy palpable, no carotid bruit   Lungs- clear without rales, wheezes   Cor- regular rate and rhythm, no murmur , gallop   Abdomen- soft, non-tender   Extremities - warm, non-tender, minimal edema   Neuro- oriented, appropriate, no focal weakness   Lab Results: Recent Labs    12/22/19 0309 12/23/19 0314  WBC 12.4* 10.4  HGB 9.3* 9.3*  HCT 28.2* 27.8*  PLT 97* 103*   BMET:  Recent Labs    12/22/19 0309 12/23/19 0314  NA 136 138  K 3.7 3.6  CL 103 105  CO2 26 25  GLUCOSE 104* 97  BUN 20 23  CREATININE 1.10 1.20  CALCIUM 7.6* 7.6*    PT/INR:  Recent Labs    12/20/19 1349  LABPROT 19.4*  INR 1.7*   ABG    Component Value Date/Time   PHART 7.409 12/20/2019 1937   HCO3 24.7 12/20/2019 1937   TCO2 26 12/20/2019 1937   ACIDBASEDEF 2.0 12/20/2019 1755   O2SAT 72.7 12/23/2019 0314   CBG (last 3)  Recent Labs    12/22/19 2344 12/23/19 0322  12/23/19 0816  GLUCAP 78 90 167*    Assessment/Plan: S/P Procedure(s) (LRB): CORONARY ARTERY BYPASS GRAFTING (CABG) times four, using left internal mammary artery and left greater saphenous vein (N/A) TRANSESOPHAGEAL ECHOCARDIOGRAM (TEE) (N/A) Mobilize Diuresis d/c tubes/lines See progression orders send home on Eliquis for postop afib after EPWs out , cont Lovenox for now   LOS: 3 days    Tharon Aquas Trigt III 12/23/2019

## 2019-12-23 NOTE — Progress Notes (Signed)
Inpatient Diabetes Program Recommendations  AACE/ADA: New Consensus Statement on Inpatient Glycemic Control (2015)  Target Ranges:  Prepandial:   less than 140 mg/dL      Peak postprandial:   less than 180 mg/dL (1-2 hours)      Critically ill patients:  140 - 180 mg/dL   Results for [name], [name] (MRN [id number]) as of 12/23/2019 09:51  Ref. Range 12/21/2019 23:21 12/22/2019 03:18 12/22/2019 07:41 12/22/2019 11:44 12/22/2019 16:04 12/22/2019 19:44  Glucose-Capillary Latest Ref Range: 70 - 99 mg/dL 152 (H)  2 units NOVOLOG  96 163 (H)  4 units NOVOLOG  174 (H)  4 units NOVOLOG +  10 units LEVEMIR 124 (H)  2 units NOVOLOG  291 (H)  12 units NOVOLOG    Results for [name], [name] (MRN [id number]) as of 12/23/2019 09:51  Ref. 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  Please consider reducing Levemir to 5 units Daily     --Will follow patient during hospitalization--  Wyn Quaker RN, MSN, CDE Diabetes Coordinator Inpatient Glycemic Control Team Team Pager: [phone] (8a-5p)

## 2019-12-23 NOTE — Anesthesia Postprocedure Evaluation (Signed)
Anesthesia Post Note  Patient: [name]  Procedure(s) Performed: CORONARY ARTERY BYPASS GRAFTING (CABG) times four, using left internal mammary artery and left greater saphenous vein (N/A Chest) TRANSESOPHAGEAL ECHOCARDIOGRAM (TEE) (N/A )     Patient location during evaluation: SICU Anesthesia Type: General Level of consciousness: awake and alert Pain management: pain level controlled Vital Signs Assessment: post-procedure vital signs reviewed and stable Respiratory status: spontaneous breathing, nonlabored ventilation, respiratory function stable and patient connected to nasal cannula oxygen Cardiovascular status: blood pressure returned to baseline and stable Postop Assessment: no apparent nausea or vomiting Anesthetic complications: no    Last Vitals:  Vitals:   12/23/19 0500 12/23/19 0600  BP: 105/68 137/72  Pulse: 76 81  Resp: 19 (!) 24  Temp:    SpO2: 95% 96%    Last Pain:  Vitals:   12/23/19 0400  TempSrc: Oral  PainSc: 0-No pain   Pain Goal: Patients Stated Pain Goal: 0 (12/21/19 2000)                 Effie Berkshire

## 2019-12-24 ENCOUNTER — Inpatient Hospital Stay (HOSPITAL_COMMUNITY): Payer: BC Managed Care – PPO

## 2019-12-24 LAB — BASIC METABOLIC PANEL
Anion gap: 7 (ref 5–15)
BUN: 16 mg/dL (ref 8–23)
CO2: 27 mmol/L (ref 22–32)
Calcium: 7.9 mg/dL — ABNORMAL LOW (ref 8.9–10.3)
Chloride: 108 mmol/L (ref 98–111)
Creatinine, Ser: 1.24 mg/dL (ref 0.61–1.24)
GFR calc Af Amer: >60 mL/min (ref >60)
GFR calc non Af Amer: 57 mL/min — ABNORMAL LOW (ref >60)
Glucose, Bld: 121 mg/dL — ABNORMAL HIGH (ref 70–99)
Potassium: 4 mmol/L (ref 3.5–5.1)
Sodium: 142 mmol/L (ref 135–145)

## 2019-12-24 LAB — GLUCOSE, CAPILLARY
Glucose-Capillary: 228 mg/dL — ABNORMAL HIGH (ref 70–99)
Glucose-Capillary: 141 mg/dL — ABNORMAL HIGH (ref 70–99)
Glucose-Capillary: 75 mg/dL (ref 70–99)
Glucose-Capillary: 166 mg/dL — ABNORMAL HIGH (ref 70–99)

## 2019-12-24 LAB — CBC
HCT: 28.5 % — ABNORMAL LOW (ref 39.0–52.0)
Hemoglobin: 9.4 g/dL — ABNORMAL LOW (ref 13.0–17.0)
MCH: 29.8 pg (ref 26.0–34.0)
MCHC: 33 g/dL (ref 30.0–36.0)
MCV: 90.5 fL (ref 80.0–100.0)
Platelets: 150 10*3/uL (ref 150–400)
RBC: 3.15 MIL/uL — ABNORMAL LOW (ref 4.22–5.81)
RDW: 13 % (ref 11.5–15.5)
WBC: 7.1 10*3/uL (ref 4.0–10.5)
nRBC: 0 % (ref 0.0–0.2)

## 2019-12-24 MED ORDER — AMIODARONE HCL IN DEXTROSE 360-4.14 MG/200ML-% IV SOLN: 30.0000 mg/h | INTRAVENOUS | Status: DC

## 2019-12-24 MED ORDER — AMIODARONE HCL IN DEXTROSE 360-4.14 MG/200ML-% IV SOLN
60.0000 mg/h | INTRAVENOUS | Status: AC
  Administered 2019-12-24: 60 mg/h via INTRAVENOUS
  Filled 2019-12-24: qty 200

## 2019-12-24 MED ORDER — AMIODARONE HCL IN DEXTROSE 360-4.14 MG/200ML-% IV SOLN
30.0000 mg/h | INTRAVENOUS | Status: DC
  Administered 2019-12-24 – 2019-12-26 (×4): 30 mg/h via INTRAVENOUS
  Filled 2019-12-24 (×4): qty 200

## 2019-12-24 MED FILL — Lidocaine HCl Local Soln Prefilled Syringe 100 MG/5ML (2%): INTRAMUSCULAR | Qty: 5 | Status: AC

## 2019-12-24 MED FILL — Electrolyte-R (PH 7.4) Solution: INTRAVENOUS | Qty: 4000 | Status: AC

## 2019-12-24 MED FILL — Mannitol IV Soln 20%: INTRAVENOUS | Qty: 500 | Status: AC

## 2019-12-24 MED FILL — Heparin Sodium (Porcine) Inj 1000 Unit/ML: INTRAMUSCULAR | Qty: 10 | Status: AC

## 2019-12-24 MED FILL — Sodium Bicarbonate IV Soln 8.4%: INTRAVENOUS | Qty: 50 | Status: AC

## 2019-12-24 MED FILL — Sodium Chloride IV Soln 0.9%: INTRAVENOUS | Qty: 3000 | Status: AC

## 2019-12-24 NOTE — Progress Notes (Addendum)
Patient ID: [name], male   DOB: 02-11-1946, 74 y.o.   MRN: [id number]  Transferred to 4E via wheelchair. Patient's VSS and denies needs at this time. Handoff to bedside RN, see flowsheets. Transferred at 2115.   This RN called the patient's wife, [name], at [phone] to let her know that the patient had been transferred.

## 2019-12-24 NOTE — Progress Notes (Signed)
CARDIAC REHAB PHASE I   PRE:  Rate/Rhythm: 85 afib    BP: sitting 130/75    SaO2: 98 RA  MODE:  Ambulation: 740 ft   POST:  Rate/Rhythm: 90 afib    BP: sitting 117/68     SaO2: 96 RA  Pt moving well although needs reminders for sternal precautions. Able to stand and walk with RW and normal pace. Steady, no assist. C/o fatigue/SOB after second lap. To recliner.  Will f/u tomorrow. [place], ACSM 12/24/2019 11:28 AM

## 2019-12-24 NOTE — Progress Notes (Signed)
4 Days Post-Op Procedure(s) (LRB): CORONARY ARTERY BYPASS GRAFTING (CABG) times four, using left internal mammary artery and left greater saphenous vein (N/A) TRANSESOPHAGEAL ECHOCARDIOGRAM (TEE) (N/A) Subjective: Atrial fibrillation rate faster today, 90-100 and patient feels poorly.  We will start IV amiodarone for chemical cardioversion. Chest x-ray shows small left pleural effusion-continue IV Lasix daily We will hold on removal of epicardial pacing wires while amiodarone is started. Objective: Vital signs in last 24 hours: Temp:  [97.8 F (36.6 C)-99.2 F (37.3 C)] 99 F (37.2 C) (01/19 0806) Pulse Rate:  [79-91] 79 (01/19 0415) Cardiac Rhythm: Atrial fibrillation (01/19 0803) Resp:  [18-28] 24 (01/19 0803) BP: (109-136)/(63-81) 112/68 (01/19 0803) SpO2:  [95 %-97 %] 97 % (01/19 0803) Weight:  [72.8 kg] 72.8 kg (01/19 0500)  Hemodynamic parameters for last 24 hours:  Stable blood pressure and O2 saturation on 2 L nasal cannula  Intake/Output from previous day: 01/18 0701 - 01/19 0700 In: 7.7 [I.V.:7.7] Out: 2150 [Urine:2150] Intake/Output this shift: No intake/output data recorded.       Exam    General- alert and comfortable    Neck- no JVD, no cervical adenopathy palpable, no carotid bruit   Lungs- clear without rales, wheezes   Cor-atrial fibrillation with rate 92, no murmur , gallop   Abdomen- soft, non-tender   Extremities - warm, non-tender, minimal edema   Neuro- oriented, appropriate, no focal weakness   Lab Results: Recent Labs    12/23/19 0314 12/24/19 0348  WBC 10.4 7.1  HGB 9.3* 9.4*  HCT 27.8* 28.5*  PLT 103* 150   BMET:  Recent Labs    12/23/19 0314 12/24/19 0348  NA 138 142  K 3.6 4.0  CL 105 108  CO2 25 27  GLUCOSE 97 121*  BUN 23 16  CREATININE 1.20 1.24  CALCIUM 7.6* 7.9*    PT/INR: No results for input(s): LABPROT, INR in the last 72 hours. ABG    Component Value Date/Time   PHART 7.409 12/20/2019 1937   HCO3 24.7  12/20/2019 1937   TCO2 26 12/20/2019 1937   ACIDBASEDEF 2.0 12/20/2019 1755   O2SAT 72.7 12/23/2019 0314   CBG (last 3)  Recent Labs    12/23/19 1600 12/23/19 2141 12/24/19 0911  GLUCAP 114* 134* 228*    Assessment/Plan: S/P Procedure(s) (LRB): CORONARY ARTERY BYPASS GRAFTING (CABG) times four, using left internal mammary artery and left greater saphenous vein (N/A) TRANSESOPHAGEAL ECHOCARDIOGRAM (TEE) (N/A) Start IV amiodarone, continued IV diuresis and daily ambulation   LOS: 4 days    [name] 12/24/2019

## 2019-12-24 NOTE — Progress Notes (Signed)
EVENING ROUNDS NOTE :     [address]       [place] [address]             [phone]                 4 Days Post-Op Procedure(s) (LRB): CORONARY ARTERY BYPASS GRAFTING (CABG) times four, using left internal mammary artery and left greater saphenous vein (N/A) TRANSESOPHAGEAL ECHOCARDIOGRAM (TEE) (N/A)   Total Length of Stay:  LOS: 4 days  Events:  Looks good In sinus on amio gtt     BP 130/75 (BP Location: Left Arm)   Pulse 88   Temp 98.4 F (36.9 C) (Oral)   Resp 20   Ht 5\' 10"  (1.778 m)   Wt 72.8 kg   SpO2 99%   BMI 23.03 kg/m         . sodium chloride    . amiodarone 30 mg/hr (12/24/19 1707)  . lactated ringers      I/O last 3 completed shifts: In: 580 [P.O.:540; I.V.:40] Out: 2950 [Urine:2950]   CBC Latest Ref Rng & Units 12/24/2019 12/23/2019 12/22/2019  WBC 4.0 - 10.5 K/uL 7.1 10.4 12.4(H)  Hemoglobin 13.0 - 17.0 g/dL 9.4(L) 9.3(L) 9.3(L)  Hematocrit 39.0 - 52.0 % 28.5(L) 27.8(L) 28.2(L)  Platelets 150 - 400 K/uL 150 103(L) 97(L)    BMP Latest Ref Rng & Units 12/24/2019 12/23/2019 12/22/2019  Glucose 70 - 99 mg/dL 121(H) 97 104(H)  BUN 8 - 23 mg/dL 16 23 20   Creatinine 0.61 - 1.24 mg/dL 1.24 1.20 1.10  BUN/Creat Ratio 10 - 24 - - -  Sodium 135 - 145 mmol/L 142 138 136  Potassium 3.5 - 5.1 mmol/L 4.0 3.6 3.7  Chloride 98 - 111 mmol/L 108 105 103  CO2 22 - 32 mmol/L 27 25 26   Calcium 8.9 - 10.3 mg/dL 7.9(L) 7.6(L) 7.6(L)    ABG    Component Value Date/Time   PHART 7.409 12/20/2019 1937   PCO2ART 39.5 12/20/2019 1937   PO2ART 118.0 (H) 12/20/2019 1937   HCO3 24.7 12/20/2019 1937   TCO2 26 12/20/2019 1937   ACIDBASEDEF 2.0 12/20/2019 1755   O2SAT 72.7 12/23/2019 0314       Melodie Bouillon, MD 12/24/2019 5:08 PM

## 2019-12-24 NOTE — Progress Notes (Signed)
Patient arrived to 4E-20 from [facility]. CHG given, VSS, cardiac monitor applied, CCMD notifed, call bell and bedside table within reach.

## 2019-12-25 ENCOUNTER — Inpatient Hospital Stay (HOSPITAL_COMMUNITY): Payer: BC Managed Care – PPO

## 2019-12-25 LAB — BASIC METABOLIC PANEL
Anion gap: 9 (ref 5–15)
BUN: 20 mg/dL (ref 8–23)
CO2: 27 mmol/L (ref 22–32)
Calcium: 8 mg/dL — ABNORMAL LOW (ref 8.9–10.3)
Chloride: 102 mmol/L (ref 98–111)
Creatinine, Ser: 1.17 mg/dL (ref 0.61–1.24)
GFR calc Af Amer: >60 mL/min (ref >60)
GFR calc non Af Amer: >60 mL/min (ref >60)
Glucose, Bld: 87 mg/dL (ref 70–99)
Potassium: 3.8 mmol/L (ref 3.5–5.1)
Sodium: 138 mmol/L (ref 135–145)

## 2019-12-25 LAB — GLUCOSE, CAPILLARY
Glucose-Capillary: 104 mg/dL — ABNORMAL HIGH (ref 70–99)
Glucose-Capillary: 177 mg/dL — ABNORMAL HIGH (ref 70–99)
Glucose-Capillary: 112 mg/dL — ABNORMAL HIGH (ref 70–99)
Glucose-Capillary: 253 mg/dL — ABNORMAL HIGH (ref 70–99)

## 2019-12-25 MED ORDER — FUROSEMIDE 40 MG PO TABS: 40.0000 mg | ORAL_TABLET | Freq: Every day | ORAL | Status: DC

## 2019-12-25 MED ORDER — METFORMIN HCL 500 MG PO TABS
500.0000 mg | ORAL_TABLET | Freq: Two times a day (BID) | ORAL | Status: DC
  Administered 2019-12-25 – 2019-12-27 (×5): 500 mg via ORAL
  Filled 2019-12-25 (×5): qty 1

## 2019-12-25 MED ORDER — LISINOPRIL 10 MG PO TABS
20.0000 mg | ORAL_TABLET | Freq: Every day | ORAL | Status: DC
  Administered 2019-12-25 – 2019-12-27 (×3): 20 mg via ORAL
  Filled 2019-12-25 (×3): qty 2

## 2019-12-25 NOTE — Discharge Instructions (Addendum)
Discharge Instructions:  1. You may shower, please wash incisions daily with soap and water and keep dry.  If you wish to cover wounds with dressing you may do so but please keep clean and change daily.  No tub baths or swimming until incisions have completely healed.  If your incisions become red or develop any drainage please call our office at [phone]  2. No Driving until cleared by our office and you are no longer using narcotic pain medications  3. Monitor your weight daily.. Please use the same scale and weigh at same time... If you gain 3-5 lbs in 48 hours with associated lower extremity swelling, please contact our office at [phone]  4. Fever of 101.5 for at least 24 hours with no source, please contact our office at [phone]  5. Activity- up as tolerated, please walk at least 3 times per day.  Avoid strenuous activity, no lifting, pushing, or pulling with your arms over 8-10 lbs for a minimum of 6 weeks  6. If any questions or concerns arise, please do not hesitate to contact our office at [phone]   Information on my medicine - ELIQUIS (apixaban)  Why was Eliquis prescribed for you? Eliquis was prescribed for you to reduce the risk of forming blood clots that can cause a stroke if you have a medical condition called atrial fibrillation (a type of irregular heartbeat) OR to reduce the risk of a blood clots forming after orthopedic surgery.  What do You need to know about Eliquis ? Take your Eliquis TWICE DAILY - one tablet in the morning and one tablet in the evening with or without food.  It would be best to take the doses about the same time each day.  If you have difficulty swallowing the tablet whole please discuss with your pharmacist how to take the medication safely.  Take Eliquis exactly as prescribed by your doctor and DO NOT stop taking Eliquis without talking to the doctor who prescribed the medication.  Stopping may increase your risk of  developing a new clot or stroke.  Refill your prescription before you run out.  After discharge, you should have regular check-up appointments with your healthcare provider that is prescribing your Eliquis.  In the future your dose may need to be changed if your kidney function or weight changes by a significant amount or as you get older.  What do you do if you miss a dose? If you miss a dose, take it as soon as you remember on the same day and resume taking twice daily.  Do not take more than one dose of ELIQUIS at the same time.  Important Safety Information A possible side effect of Eliquis is bleeding. You should call your healthcare provider right away if you experience any of the following: ? Bleeding from an injury or your nose that does not stop. ? Unusual colored urine (red or dark brown) or unusual colored stools (red or black). ? Unusual bruising for unknown reasons. ? A serious fall or if you hit your head (even if there is no bleeding).  Some medicines may interact with Eliquis and might increase your risk of bleeding or clotting while on Eliquis. To help avoid this, consult your healthcare provider or pharmacist prior to using any new prescription or non-prescription medications, including herbals, vitamins, non-steroidal anti-inflammatory drugs (NSAIDs) and supplements.  This website has more information on Eliquis (apixaban): [email or web address].    Please monitor your blood glucose level while taking Keflex and Metformin  at the same time. Keflex may limit the ability for Metformin to leave the body through the kidneys therefore, hypoglycemia may occur. If this occurs, your metformin dose may need reduced. Please call our office if this occurs.

## 2019-12-25 NOTE — Progress Notes (Addendum)
[address]       [place] [address]             [phone]      5 Days Post-Op Procedure(s) (LRB): CORONARY ARTERY BYPASS GRAFTING (CABG) times four, using left internal mammary artery and left greater saphenous vein (N/A) TRANSESOPHAGEAL ECHOCARDIOGRAM (TEE) (N/A) Subjective: Generally describes feeling poorly, fairly non-specific  Objective: Vital signs in last 24 hours: Temp:  [98.3 F (36.8 C)-99.4 F (37.4 C)] 98.5 F (36.9 C) (01/20 0532) Pulse Rate:  [80-96] 80 (01/20 0532) Cardiac Rhythm: Atrial fibrillation;Bundle branch block (01/19 2135) Resp:  [17-26] 20 (01/20 0532) BP: (130-155)/(72-105) 141/75 (01/20 0532) SpO2:  [97 %-100 %] 100 % (01/20 0532) Weight:  [71.6 kg] 71.6 kg (01/20 0532)  Hemodynamic parameters for last 24 hours:    Intake/Output from previous day: 01/19 0701 - 01/20 0700 In: 1049.1 [P.O.:660; I.V.:389.1] Out: 2255 [Urine:2255] Intake/Output this shift: No intake/output data recorded.  General appearance: alert, cooperative and no distress Heart: irregularly irregular rhythm Lungs: mildly dim in left base Abdomen: benign Extremities: ni edema Wound: incis healing well, + left thy echymosis - soft  Lab Results: Recent Labs    12/23/19 0314 12/24/19 0348  WBC 10.4 7.1  HGB 9.3* 9.4*  HCT 27.8* 28.5*  PLT 103* 150   BMET:  Recent Labs    12/24/19 0348 12/25/19 0211  NA 142 138  K 4.0 3.8  CL 108 102  CO2 27 27  GLUCOSE 121* 87  BUN 16 20  CREATININE 1.24 1.17  CALCIUM 7.9* 8.0*    PT/INR: No results for input(s): LABPROT, INR in the last 72 hours. ABG    Component Value Date/Time   PHART 7.409 12/20/2019 1937   HCO3 24.7 12/20/2019 1937   TCO2 26 12/20/2019 1937   ACIDBASEDEF 2.0 12/20/2019 1755   O2SAT 72.7 12/23/2019 0314   CBG (last 3)  Recent Labs    12/24/19 1546 12/24/19 2226 12/25/19 0639  GLUCAP 75 166* 104*    Meds Scheduled Meds: . acetaminophen  1,000 mg Oral Q6H   Or  . acetaminophen (TYLENOL) oral liquid 160 mg/5 mL  1,000 mg Per Tube Q6H  . bisacodyl  10 mg Oral Daily   Or  . bisacodyl  10 mg Rectal Daily  . Chlorhexidine Gluconate Cloth  6 each Topical Daily  . [place] [facility], Patient & Family Education   Does not apply Once  . docusate sodium  200 mg Oral Daily  . enoxaparin (LOVENOX) injection  30 mg Subcutaneous QHS  . furosemide  40 mg Intravenous Daily  . insulin aspart  0-24 Units Subcutaneous TID AC & HS  . insulin detemir  10 Units Subcutaneous Daily  . mouth rinse  15 mL Mouth Rinse BID  . metoprolol tartrate  12.5 mg Oral BID  . pantoprazole  40 mg Oral Daily  . potassium chloride  20 mEq Oral BID  . sodium chloride flush  3 mL Intravenous Q12H  . sodium chloride flush  3 mL Intravenous Q12H  . tamsulosin  0.4 mg Oral QPC supper  . thiamine  100 mg Oral Daily   Continuous Infusions: . sodium chloride    . amiodarone 30 mg/hr (12/25/19 0308)  . lactated ringers     PRN Meds:.sodium chloride, influenza vaccine adjuvanted, magnesium hydroxide, metoprolol tartrate, ondansetron (ZOFRAN) IV, sodium chloride flush, sodium chloride flush  Xrays DG Chest 2 View  Result Date: 12/25/2019 CLINICAL DATA:  History of coronary bypass graft EXAM: CHEST - 2 VIEW COMPARISON:  12/24/2019 FINDINGS: Cardiac shadow is mildly prominent but stable. Postsurgical changes are again seen. Left basilar atelectasis is noted although improving from the prior study. Small left pleural effusion remains. No acute bony abnormality is noted IMPRESSION: Improving aeration in the left base. Small persistent effusion remains. Electronically Signed   By: Inez Catalina M.D.   On: 12/25/2019 08:14   DG Chest 2 View  Result Date: 12/24/2019 CLINICAL DATA:  CABG 4 days ago. EXAM: CHEST - 2 VIEW COMPARISON:  One-view chest x-ray 12/23/2019 FINDINGS: The right IJ sheath was removed. Previously noted left apical pneumothorax has resolved. Left greater than right  pleural effusions are present. Bibasilar airspace opacities are associated. The upper fields are clear. IMPRESSION: 1. Interval resolution of left apical pneumothorax. 2. Left greater than right pleural effusions and associated airspace disease, likely atelectasis. Electronically Signed   By: San Morelle M.D.   On: 12/24/2019 06:49    Assessment/Plan: S/P Procedure(s) (LRB): CORONARY ARTERY BYPASS GRAFTING (CABG) times four, using left internal mammary artery and left greater saphenous vein (N/A) TRANSESOPHAGEAL ECHOCARDIOGRAM (TEE) (N/A)  1 afib with good rate control, conts amiodarone IV for now. Will need to possibly consider ACRx. BP elevated at times- will restart ACE-I at lower does and titrate- normal renal function 2 sats good on RA- cont pulm toilet 3 good UOP- weight trending down, may be able to d/c diuretics soon- cont for today 4 BS adeq control- restart glucophage and d/c insulin except SSI 5 routine rehab   LOS: 5 days    John Giovanni [facility] 12/25/2019 Pager [phone]  Patient has been in controlled afib postop- on iv amio for chemical cardioversion. Will send home on eliquis, EPWs still in place Stop iv lasix after today- CXR shows improvement in L pleural effusion  patient examined and medical record reviewed,agree with above note. Tharon Aquas Trigt III 12/25/2019

## 2019-12-25 NOTE — Progress Notes (Signed)
CARDIAC REHAB PHASE I   PRE:  Rate/Rhythm: 80 afib    BP: lying 138/78    SaO2: 99 RA  MODE:  Ambulation: 860 ft   POST:  Rate/Rhythm: 90 afib    BP: sitting 139/77     SaO2: 98 RA  Pt needs reminders of sternal precautions at times. Moving well though and able to walk hall with RW at quick pace. Steady. To recliner, VSS. Encouraged more IS (1500 mL) and x2 more walks. Afib stable. [id number]   [place], ACSM 12/25/2019 10:34 AM

## 2019-12-25 NOTE — Progress Notes (Signed)
[address]       [place] [address]             [phone]      Physician Discharge Summary  Patient ID: [name] MRN: [id number] DOB/AGE: January 22, 1946 74 y.o.  Admit date: 12/20/2019 Discharge date: 12/27/2019  Admission Diagnoses:  Patient Active Problem List   Diagnosis Date Noted  . Unstable angina ([place]) 12/04/2019  . Abnormal nuclear stress test   . Atrial fibrillation ([place]) 04/21/2016  . Pre-syncope 04/10/2016  . Diabetes ([place]) 04/10/2016  . Hyperlipidemia   . Right inguinal hernia 10/18/2011  . Personal history of colonic polyps 06/13/2011  . Guaiac positive stools 06/13/2011  . GERD (gastroesophageal reflux disease) 06/13/2011  . ARTHRITIS 03/03/2009  . DIABETES MELLITUS 03/02/2009  . Essential hypertension 03/02/2009  . Coronary atherosclerosis 03/02/2009  . HEMORRHOIDS 03/02/2009  . ESOPHAGEAL STRICTURE 03/02/2009  . GERD 03/02/2009  . HIATAL HERNIA 03/02/2009  . POLYP, GALLBLADDER 03/02/2009    Discharge Diagnoses:  Active Problems:   S/P CABG x 4   Discharged Condition: good  HPI:   Patient presents for further discussion and for pending multivessel coronary bypass grafting.  He was diagnosed with moderate-severe three-vessel CAD after a positive stress test.  He had some facial-jaw swelling occurring on each side which appeared to be inflammatory possibly related to parotid duct stones.  These resolved with a course of antibiotics and time.  He was evaluated by his dentist and found to have no dental disease.  During this time he had chest pain at home associated with fatigue and decreased exercise tolerance.  The pain did not resolve so he presented to the ED approximately 4 days ago.  No acute EKG changes were noted and cardiac enzymes were not elevated.  He was discharged home and completed his preoperative evaluation earlier this week.  Correlating with the onset of his chest discomfort and fatigue has been short-term  memory loss.  The patient underwent a brain CT which showed no evidence of stroke but was a few small vessel disease.  CT scan of the chest showed no significant pulmonary disease with mild-moderate atherosclerotic thoracic aortic disease mainly in the mid to distal arch and proximal descending thoracic aorta.  There did not appear to be significant calcification of the ascending aorta.  Patient is now scheduled for surgery on January 15.  He will have his pre-CABG Dopplers today.  I discussed the procedure of CABG in detail with the patient and his wife including the plan to do four-vessel bypass grafting, the location of the surgical incisions, and expected postoperative recovery.  We discussed the potential risks to him including the risks of MI, stroke, bleeding, infection, organ failure, death.  We discussed that bypass surgery is the best long-term treatment for his coronary disease related to diabetes hypertension and dyslipidemia  Hospital Course:   On 12/20/2019 [name] underwent a CABG x 4 with Dr. Prescott Gum. He tolerated the procedure well and was transferred to the surgical ICU. He was extubated in a timely manner. POD 1 we began to mobilize the patient. We discontinued chest tubes and lines. His renal function remained stable. On POD 2 the patient was in atrial fibrillation which was treated with IV Amio protocol. We continued to wean milrinone as tolerated. POD 3 he was stable to transfer to the stepdown unit for continued care. We planned to send the patient home on Eliquis once his epicardial pacing wires.  Chest xray showed some small pleural effusions therefore, we continued IV lasix. POD 5 he continued to feel poorly. We started an ACEI for better blood pressure control. Oxygen saturation is well controlled on room air.  His weight continued to trend down. Remains on IV Amio for chemical conversion for atrial fibrillation. Keep EPW in place for now. CXR shows an improved pleural effusion  therefore, we discontinued lasix. He continued to progress. His EPW were removed on 12/26/2019 without issue. He was ambulating in the halls independently. Today, he is tolerating room air with excellent oxygen saturation, he is ambulating with limited assistance, his incisions are healing well, and he is ready for discharge home.   Consults: diabetic coordinator  Significant Diagnostic Studies:   CLINICAL DATA:  History of coronary bypass graft  EXAM: CHEST - 2 VIEW  COMPARISON:  12/24/2019  FINDINGS: Cardiac shadow is mildly prominent but stable. Postsurgical changes are again seen. Left basilar atelectasis is noted although improving from the prior study. Small left pleural effusion remains. No acute bony abnormality is noted  IMPRESSION: Improving aeration in the left base. Small persistent effusion remains.   Electronically Signed   By: Inez Catalina M.D.   On: 12/25/2019 08:14   Treatments:   NAME: [name], [name]. MEDICAL RECORD [id number] ACCOUNT [id number] DATE OF BIRTH:29-Dec-1945 FACILITY: [facility] LOCATION: [facility] PHYSICIAN:PETER VAN TRIGT III, MD  OPERATIVE REPORT  DATE OF PROCEDURE:  12/20/2019  OPERATIONS: 1.  Coronary artery bypass grafting x4 (left internal mammary artery to left anterior descending, saphenous vein graft to right coronary artery, saphenous vein graft to obtuse marginal 1, saphenous vein graft to diagonal). 2.  Endoscopic harvest of left leg greater saphenous vein.  SURGEON:  Ivin Poot, MD  ASSISTANT:  Jadene Pierini, PA-C  PREOPERATIVE DIAGNOSIS:  Unstable angina with severe 3-vessel coronary artery disease.  POSTOPERATIVE DIAGNOSIS:  Unstable angina with severe 3-vessel coronary artery disease.  Discharge Exam: Blood pressure 119/76, pulse (!) 110, temperature 98 F (36.7 C), temperature source Oral, resp. rate 19, height 5\' 10"  (1.778 m), weight 69.7 kg, SpO2 99 %.    General appearance: alert, cooperative and  no distress Heart: irregularly irregular rhythm Lungs: clear to auscultation bilaterally Abdomen: hyperactive Extremities: extremities normal, atraumatic, no cyanosis or edema Wound: clean and dry   Disposition:  Discharge disposition: 01-Home or Self Care        Allergies as of 12/27/2019      Reactions   Statins Anaphylaxis   Myalgias   Bismuth Subsalicylate Nausea And Vomiting   Codeine Nausea And Vomiting   Morphine And Related Nausea And Vomiting   Altace [ramipril] Rash      Medication List    STOP taking these medications   APPLE CIDER VINEGAR PO   Co Q-10 [id number] MG Caps   Garlic [id number] MG Tabs   metoprolol succinate 25 MG 24 hr tablet Commonly known as: TOPROL-XL   nitroGLYCERIN 0.4 MG SL tablet Commonly known as: NITROSTAT   Turmeric 500 MG Caps     TAKE these medications   acetaminophen 650 MG CR tablet Commonly known as: TYLENOL Take 1,300 mg by mouth every 8 (eight) hours as needed for pain.   amiodarone 200 MG tablet Commonly known as: PACERONE Take 1 tablet (200 mg total) by mouth 2 (two) times daily.   apixaban 5 MG Tabs tablet Commonly known as: Eliquis Take 1 tablet (5 mg total) by mouth 2 (two) times daily.   aspirin EC 81 MG tablet Take  1 tablet (81 mg total) by mouth daily.   B-complex with vitamin C tablet Take 3 tablets by mouth daily with breakfast.   Calcium-Magnesium-Zinc-D3 Tabs Take 3 tablets by mouth daily with lunch.   cephALEXin 500 MG capsule Commonly known as: Keflex Take 1 capsule (500 mg total) by mouth 3 (three) times daily.   cetirizine 10 MG tablet Commonly known as: ZYRTEC Take 10 mg by mouth daily.   fluticasone 50 MCG/ACT nasal spray Commonly known as: FLONASE Place 2 sprays into both nostrils daily as needed for allergies.   hydrocortisone 2.5 % rectal cream Commonly known as: ANUSOL-HC Place 1 application rectally 2 (two) times daily.   lansoprazole 15 MG capsule Commonly known as: PREVACID Take 15  mg by mouth daily.   lisinopril 20 MG tablet Commonly known as: ZESTRIL Take 1 tablet (20 mg total) by mouth daily. Start taking on: December 28, 2019 What changed:   medication strength  how much to take   metFORMIN 500 MG tablet Commonly known as: GLUCOPHAGE Take 1 tablet (500 mg total) by mouth 2 (two) times daily with a meal.   metoprolol tartrate 25 MG tablet Commonly known as: LOPRESSOR Take 1 tablet (25 mg total) by mouth 2 (two) times daily.   olopatadine 0.1 % ophthalmic solution Commonly known as: PATANOL Place 1 drop into both eyes 2 (two) times daily as needed for allergies.   tamsulosin 0.4 MG Caps capsule Commonly known as: FLOMAX Take 0.4 mg by mouth daily after supper.      Follow-up Information    Marton Redwood, MD. Call in 1 day(s).   Specialty: Internal Medicine Contact information: [place] [address] [phone]        Lendon Colonel, NP Follow up.   Specialties: Nurse Practitioner, Radiology, Cardiology Why: Jory Sims, NP 2/10 @1 :45 pm ([facility])  Contact information: [address] [place] [address] [phone]        Ivin Poot, MD Follow up.   Specialty: Cardiothoracic Surgery Why: Your routine follow-up appointment 2/17 at 1:30pm. Please report to [facility] at 1:00pm for a chest xray. [facility] is located on the first floor of our building.  Contact information: [place] [address] [phone]          The patient has been discharged on:   1.Beta Blocker:  Yes [place]   ]                              No   [   ]                              If No, reason:  2.Ace Inhibitor/ARB: Yes [ yes  ]                                     No  [    ]                                     If No, reason:  3.Statin:   Yes [   ]  No  [ no ]                  If No, reason: allergy  4.Shela CommonsVelta Addison  [ yes  ]                   No   [   ]                  If No, reason:     Signed: Elgie Collard 12/27/2019, 10:29 AM

## 2019-12-25 NOTE — Research (Addendum)
[facility] Research Study  Screening Visit 1 labs and urine collected  ABNORMAL LABS   [x]    No change or change not clinically significant. NO FOLLOW UP REQUIRED. []    Change clinically significant and attributable to disease or management. NO FOLLOW UP REQUIRED. []    Change clinically significant and possibly attributable to study medication. NO FOLLOW UP REQUIRED. []    Change clinically significant and attributable to study medication. FOLLOW UP REQUIRED. []    Apparent lab error. []    Unevaluable.

## 2019-12-26 ENCOUNTER — Ambulatory Visit: Payer: BC Managed Care – PPO | Admitting: Cardiology

## 2019-12-26 LAB — GLUCOSE, CAPILLARY
Glucose-Capillary: 108 mg/dL — ABNORMAL HIGH (ref 70–99)
Glucose-Capillary: 166 mg/dL — ABNORMAL HIGH (ref 70–99)
Glucose-Capillary: 143 mg/dL — ABNORMAL HIGH (ref 70–99)
Glucose-Capillary: 136 mg/dL — ABNORMAL HIGH (ref 70–99)

## 2019-12-26 MED ORDER — WHITE PETROLATUM EX OINT
TOPICAL_OINTMENT | CUTANEOUS | Status: DC | PRN
  Filled 2019-12-26: qty 28.35

## 2019-12-26 MED ORDER — AMIODARONE HCL 200 MG PO TABS
200.0000 mg | ORAL_TABLET | Freq: Two times a day (BID) | ORAL | Status: DC
  Filled 2019-12-26: qty 1

## 2019-12-26 MED ORDER — ACETAMINOPHEN 500 MG PO TABS
1000.0000 mg | ORAL_TABLET | Freq: Three times a day (TID) | ORAL | Status: DC | PRN
  Administered 2019-12-26 (×2): 1000 mg via ORAL
  Filled 2019-12-26 (×2): qty 2

## 2019-12-26 MED ORDER — CALCIUM CARBONATE ANTACID 500 MG PO CHEW
1.0000 | CHEWABLE_TABLET | Freq: Three times a day (TID) | ORAL | Status: DC | PRN
  Administered 2019-12-26: 14:00:00 200 mg via ORAL
  Filled 2019-12-26: qty 1

## 2019-12-26 MED ORDER — FUROSEMIDE 40 MG PO TABS
40.0000 mg | ORAL_TABLET | Freq: Every day | ORAL | Status: DC
  Administered 2019-12-26 – 2019-12-27 (×2): 40 mg via ORAL
  Filled 2019-12-26 (×2): qty 1

## 2019-12-26 MED ORDER — AMIODARONE HCL 200 MG PO TABS
200.0000 mg | ORAL_TABLET | Freq: Two times a day (BID) | ORAL | Status: DC
  Administered 2019-12-26 – 2019-12-27 (×3): 200 mg via ORAL
  Filled 2019-12-26 (×2): qty 1

## 2019-12-26 MED ORDER — AMIODARONE HCL 200 MG PO TABS: 400.0000 mg | ORAL_TABLET | Freq: Two times a day (BID) | ORAL | Status: DC

## 2019-12-26 MED FILL — Heparin Sodium (Porcine) Inj 1000 Unit/ML: INTRAMUSCULAR | Qty: 3000 | Status: AC

## 2019-12-26 MED FILL — Potassium Chloride Inj 2 mEq/ML: INTRAVENOUS | Qty: 40 | Status: AC

## 2019-12-26 MED FILL — Heparin Sodium (Porcine) Inj 1000 Unit/ML: INTRAMUSCULAR | Qty: 30 | Status: AC

## 2019-12-26 MED FILL — Magnesium Sulfate Inj 50%: INTRAMUSCULAR | Qty: 10 | Status: AC

## 2019-12-26 NOTE — Progress Notes (Signed)
      [address]       ,[place] [address]             [phone]      6 Days Post-Op Procedure(s) (LRB): CORONARY ARTERY BYPASS GRAFTING (CABG) times four, using left internal mammary artery and left greater saphenous vein (N/A) TRANSESOPHAGEAL ECHOCARDIOGRAM (TEE) (N/A) Subjective: Feels okay this morning. No complaints.   Objective: Vital signs in last 24 hours: Temp:  [98 F (36.7 C)-99.3 F (37.4 C)] 98.7 F (37.1 C) (01/21 0810) Pulse Rate:  [70-100] 100 (01/21 0810) Cardiac Rhythm: Atrial fibrillation (01/21 0700) Resp:  [16-20] 20 (01/21 0810) BP: (117-141)/(62-78) 117/63 (01/21 0810) SpO2:  [92 %-100 %] 100 % (01/21 0810) Weight:  [68.9 kg] 68.9 kg (01/21 0555)     Intake/Output from previous day: 01/20 0701 - 01/21 0700 In: 409 [I.V.:409] Out: 1925 [Urine:1925] Intake/Output this shift: No intake/output data recorded.  General appearance: alert, cooperative and no distress Heart: regular rate and rhythm, S1, S2 normal, no murmur, click, rub or gallop Lungs: clear to auscultation bilaterally Abdomen: soft, non-tender; bowel sounds normal; no masses,  no organomegaly Extremities: extremities normal, atraumatic, no cyanosis or edema Wound: clean and dry  Lab Results: Recent Labs    12/24/19 0348  WBC 7.1  HGB 9.4*  HCT 28.5*  PLT 150   BMET:  Recent Labs    12/24/19 0348 12/25/19 0211  NA 142 138  K 4.0 3.8  CL 108 102  CO2 27 27  GLUCOSE 121* 87  BUN 16 20  CREATININE 1.24 1.17  CALCIUM 7.9* 8.0*    PT/INR: No results for input(s): LABPROT, INR in the last 72 hours. ABG    Component Value Date/Time   PHART 7.409 12/20/2019 1937   HCO3 24.7 12/20/2019 1937   TCO2 26 12/20/2019 1937   ACIDBASEDEF 2.0 12/20/2019 1755   O2SAT 72.7 12/23/2019 0314   CBG (last 3)  Recent Labs    12/25/19 1643 12/25/19 2158 12/26/19 0629  GLUCAP 112* 253* 108*    Assessment/Plan: S/P Procedure(s) (LRB): CORONARY ARTERY BYPASS  GRAFTING (CABG) times four, using left internal mammary artery and left greater saphenous vein (N/A) TRANSESOPHAGEAL ECHOCARDIOGRAM (TEE) (N/A)  1. CV- afib with rate in the 80s. Remains on IV Amio since 1/19 for chemical conversion. Eliquis at d/c after EPW have been pulled. BP improved with the addition of ACEI 2. Pulm-tolerating room air with good oxygen saturation 3. Renal- creatinine 1.17, electrolytes well controlled, continue lasix for fluid overload-weight continued to trend down.  4. H and H 9.4/28.5 5. Blood glucose mostly well controlled.   Plan: Change IV Amio to oral Amio. Remove EPW. Using his incentive spirometer hourly and continued to ambulate in the halls. Continue lasix for fluid overload. Pain has been well controlled on Tylenol. Will add back.    LOS: 6 days    Elgie Collard 12/26/2019

## 2019-12-26 NOTE — Progress Notes (Signed)
CARDIAC REHAB PHASE I   PRE:  Rate/Rhythm: 72 afib    BP: sitting 137/69    SaO2: 94 RA  MODE:  Ambulation: 940 ft   POST:  Rate/Rhythm: 103 afib    BP: sitting 116/85     SaO2: 98 RA  Pt sts hes tired this pm. Was able to walk with min assist without RW. Steady, no rest. Return to bed for nap. Afib controlled. Encouraged another walk and IS. [place], ACSM 12/26/2019 2:42 PM

## 2019-12-26 NOTE — Progress Notes (Signed)
RFA area noted to be red and painful, pt states he had a previous IV site there which had medication leak out necessitating IV site removal. Gave pt ice pack for relief- will continue to monitor.

## 2019-12-27 LAB — GLUCOSE, CAPILLARY
Glucose-Capillary: 116 mg/dL — ABNORMAL HIGH (ref 70–99)
Glucose-Capillary: 164 mg/dL — ABNORMAL HIGH (ref 70–99)

## 2019-12-27 MED ORDER — METOPROLOL TARTRATE 25 MG PO TABS: 25.0000 mg | ORAL_TABLET | Freq: Two times a day (BID) | ORAL | 1 refills | Status: DC

## 2019-12-27 MED ORDER — CEPHALEXIN 500 MG PO CAPS: 500.0000 mg | ORAL_CAPSULE | Freq: Three times a day (TID) | ORAL | 0 refills | Status: DC

## 2019-12-27 MED ORDER — METOPROLOL TARTRATE 25 MG PO TABS
25.0000 mg | ORAL_TABLET | Freq: Two times a day (BID) | ORAL | Status: DC
  Administered 2019-12-27: 25 mg via ORAL
  Filled 2019-12-27: qty 1

## 2019-12-27 MED ORDER — LISINOPRIL 20 MG PO TABS: 20.0000 mg | ORAL_TABLET | Freq: Every day | ORAL | 1 refills | Status: DC

## 2019-12-27 MED ORDER — APIXABAN 5 MG PO TABS: 5.0000 mg | ORAL_TABLET | Freq: Two times a day (BID) | ORAL | 0 refills | Status: DC

## 2019-12-27 MED ORDER — AMIODARONE HCL 200 MG PO TABS: 200.0000 mg | ORAL_TABLET | Freq: Two times a day (BID) | ORAL | 1 refills | Status: DC

## 2019-12-27 NOTE — Progress Notes (Addendum)
      [address]       [place] [address]             [phone]      7 Days Post-Op Procedure(s) (LRB): CORONARY ARTERY BYPASS GRAFTING (CABG) times four, using left internal mammary artery and left greater saphenous vein (N/A) TRANSESOPHAGEAL ECHOCARDIOGRAM (TEE) (N/A) Subjective: Feels good today and asking if he can go home.   Objective: Vital signs in last 24 hours: Temp:  [98 F (36.7 C)-99 F (37.2 C)] 98 F (36.7 C) (01/22 0730) Pulse Rate:  [78-110] 110 (01/22 0730) Cardiac Rhythm: Normal sinus rhythm (01/22 0148) Resp:  [18-22] 19 (01/22 0730) BP: (107-148)/(63-82) 119/76 (01/22 0730) SpO2:  [96 %-100 %] 99 % (01/22 0730) Weight:  [69.7 kg] 69.7 kg (01/22 0404)     Intake/Output from previous day: 01/21 0701 - 01/22 0700 In: 600 [P.O.:600] Out: 1850 [Urine:1850] Intake/Output this shift: No intake/output data recorded.  General appearance: alert, cooperative and no distress Heart: irregularly irregular rhythm Lungs: clear to auscultation bilaterally Abdomen: hyperactive Extremities: extremities normal, atraumatic, no cyanosis or edema Wound: clean and dry  Lab Results: No results for input(s): WBC, HGB, HCT, PLT in the last 72 hours. BMET:  Recent Labs    12/25/19 0211  NA 138  K 3.8  CL 102  CO2 27  GLUCOSE 87  BUN 20  CREATININE 1.17  CALCIUM 8.0*    PT/INR: No results for input(s): LABPROT, INR in the last 72 hours. ABG    Component Value Date/Time   PHART 7.409 12/20/2019 1937   HCO3 24.7 12/20/2019 1937   TCO2 26 12/20/2019 1937   ACIDBASEDEF 2.0 12/20/2019 1755   O2SAT 72.7 12/23/2019 0314   CBG (last 3)  Recent Labs    12/26/19 1607 12/26/19 2138 12/27/19 0610  GLUCAP 143* 136* 116*    Assessment/Plan: S/P Procedure(s) (LRB): CORONARY ARTERY BYPASS GRAFTING (CABG) times four, using left internal mammary artery and left greater saphenous vein (N/A) TRANSESOPHAGEAL ECHOCARDIOGRAM (TEE) (N/A)  1.  CV-Rate controlled a-fib. BP well controlled. Continue Amio 200mg  BID, Lisinopril 20mg , metoprolol 12.5 BID-increase to 25mg  BID.  2. Pulm- tolerating room air with good oxygen saturation.  3. Renal-creatinine 1.17, electrolytes okay. Weight is below baseline. Lsix for one more day then discontinue 4. H and H stable 5. Right arm cellulitis. Improved from yesterday. His IV infiltrated and was moved to his other arm 2 nights ago. Last night site was bright red and sore. This morning mild symptoms. Ice on and off today. May need Keflex PO for home.   Plan: Home later today vs tomorrow. Will not need lasix for home since he is below his baseline weight. Labs are stable. Ambulating with limited assistance. Increase metoprolol to 25mg  BID for better rate control.    LOS: 7 days    Elgie Collard 12/27/2019  DC instructions reviewed with patient patient examined and medical record reviewed,agree with above note. Tharon Aquas Trigt III 12/27/2019

## 2019-12-27 NOTE — Progress Notes (Signed)
Orders received to discharge patient.  Telemetry monitor removed and CCMD notified.  PIV access removed.  Discharge instructions, follow up, medications and instructions for their use discussed with patient and wife.

## 2019-12-27 NOTE — Progress Notes (Signed)
Chest tube sutures removed per MD order without difficulty.  Will continue to monitor. 

## 2019-12-27 NOTE — Progress Notes (Signed)
Discussed sternal precautions, IS, exercise, diet, CRPII, and AFib with pt and wife. Good reception. Will refer to [place].  [place], ACSM 1:43 PM 12/27/2019

## 2019-12-27 NOTE — Research (Addendum)
[place] Study  Visit 6 labs and urine collected  ABNORMAL LABS   [x]    No change or change not clinically significant. NO FOLLOW UP REQUIRED. []    Change clinically significant and attributable to disease or management. NO FOLLOW UP REQUIRED. []    Change clinically significant and possibly attributable to study medication. NO FOLLOW UP REQUIRED. []    Change clinically significant and attributable to study medication. FOLLOW UP REQUIRED. []    Apparent lab error. []    Unevaluable.

## 2019-12-29 LAB — BPAM RBC
Blood Product Expiration Date: 202102022359
Blood Product Expiration Date: 202102022359
ISSUE DATE / TIME: 202101150956
ISSUE DATE / TIME: 202101150956
Unit Type and Rh: 7300
Unit Type and Rh: 7300

## 2019-12-29 LAB — TYPE AND SCREEN
ABO/RH(D): B POS
Antibody Screen: NEGATIVE
Unit division: 0
Unit division: 0

## 2019-12-31 NOTE — Research (Addendum)
[name]  Visit 7 labs and urine collected  ABNORMAL LABS   [x]    No change or change not clinically significant. NO FOLLOW UP REQUIRED. []    Change clinically significant and attributable to disease or management. NO FOLLOW UP  REQUIRED. []    Change clinically significant and possibly attributable to [name] medication. NO FOLLOW UP REQUIRED. []    Change clinically significant and attributable to [name] medication. FOLLOW UP REQUIRED. []    Apparent lab error. []    Unevaluable.

## 2019-12-31 NOTE — Research (Addendum)
[place] Study  Visit 8 labs and urine collected  ABNORMAL LABS   [x]    No change or change not clinically significant. NO FOLLOW UP REQUIRED. []    Change clinically significant and attributable to disease or management. NO FOLLOW UP  REQUIRED. []    Change clinically significant and possibly attributable to study medication. NO FOLLOW UP REQUIRED. []    Change clinically significant and attributable to study medication. FOLLOW UP REQUIRED. []    Apparent lab error. []    Unevaluable.

## 2020-01-01 NOTE — Discharge Summary (Addendum)
Physician Discharge Summary  Patient ID: CLINT GRONEMEYER MRN: [id number] DOB/AGE: 75-Mar-1947 75 y.o.  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   Unstable angina ([place]) 12/04/2019   Abnormal nuclear stress test    Atrial fibrillation ([place]) 04/21/2016   Pre-syncope 04/10/2016   Diabetes ([place]) 04/10/2016   Hyperlipidemia    Right inguinal hernia 10/18/2011   Personal history of colonic polyps 06/13/2011   Guaiac positive stools 06/13/2011   GERD (gastroesophageal reflux disease) 06/13/2011   ARTHRITIS 03/03/2009   DIABETES MELLITUS 03/02/2009   Essential hypertension 03/02/2009   Coronary atherosclerosis 03/02/2009   HEMORRHOIDS 03/02/2009   ESOPHAGEAL STRICTURE 03/02/2009   GERD 03/02/2009   HIATAL HERNIA 03/02/2009   POLYP, GALLBLADDER 03/02/2009     HPI: Patient presents for further discussion and for pending multivessel coronary bypass grafting.  He was diagnosed with moderate-severe three-vessel CAD after a positive stress test.  He had some facial-jaw swelling occurring on each side which appeared to be inflammatory possibly related to parotid duct stones.  These resolved with a course of antibiotics and time.  He was evaluated by his dentist and found to have no dental disease.  During this time he had chest pain at home associated with fatigue and decreased exercise tolerance.  The pain did not resolve so he presented to the ED approximately 4 days ago.  No acute EKG changes were noted and cardiac enzymes were not elevated.  He was discharged home and completed his preoperative evaluation earlier this week.  Correlating with the onset of his chest discomfort and fatigue has been short-term memory loss.  The patient underwent a brain CT which showed no evidence of stroke but was a few small  vessel disease.  CT scan of the chest showed no significant pulmonary disease with mild-moderate atherosclerotic thoracic aortic disease mainly in the mid to distal arch and proximal descending thoracic aorta.  There did not appear to be significant calcification of the ascending aorta.   Patient is now scheduled for surgery on January 15.  He will have his pre-CABG Dopplers today.  I discussed the procedure of CABG in detail with the patient and his wife including the plan to do four-vessel bypass grafting, the location of the surgical incisions, and expected postoperative recovery.  We discussed the potential risks to him including the risks of MI, stroke, bleeding, infection, organ failure, death.  We discussed that bypass surgery is the best long-term treatment for his coronary disease related to diabetes hypertension and dyslipidemia.   Discharged Condition: good  Hospital Course: Patient was admitted electively on 12/20/2019 he was taken the operating room where he underwent the below described procedure.  He tolerated the procedure well was taken to the surgical intensive care unit in stable condition.  Postoperative hospital course:  Patient has done well postoperatively. He has maintained stable hemodynamics.  He did require short-term milrinone which was weaned without difficulty.  He did have postoperative atrial fibrillation but he has been chemically cardioverted to sinus rhythm with amiodarone and beta-blocker.  All routine lines monitors and drainage devices have been discontinued in standard fashion.  The patient had an expected acute blood loss anemia and values have stabilized.  Most recent hemoglobin hematocrit are 9.4/28.5 respectively.  He did have some mild volume overload  but responded well to diuresis.  Renal function has been in the normal range.  Blood sugars have been under good control.  He was restarted on Metformin at time of discharge.  Incisions are noted to be healing well  without evidence of infection.  He is tolerating diet and routine activities.  Oxygen was weaned and he maintains good saturations on room air.  At the time of discharge the patient was felt to be quite stable.   Consults: None  Significant Diagnostic Studies: routine post-op labs and serial CXR's  Treatments: surgery:  OPERATIVE REPORT   DATE OF PROCEDURE:  12/20/2019   OPERATIONS: 1.  Coronary artery bypass grafting x4 (left internal mammary artery to left anterior descending, saphenous vein graft to right coronary artery, saphenous vein graft to obtuse marginal 1, saphenous vein graft to diagonal). 2.  Endoscopic harvest of left leg greater saphenous vein.   SURGEON:  Ivin Poot, MD   ASSISTANT:  Jadene Pierini, PA-C   PREOPERATIVE DIAGNOSIS:  Unstable angina with severe 3-vessel coronary artery disease.   POSTOPERATIVE DIAGNOSIS:  Unstable angina with severe 3-vessel coronary artery disease.   ANESTHESIA:  General by Dr. Suella Broad. Discharge Exam: Blood pressure 126/73, pulse 87, temperature 98.8 F (37.1 C), temperature source Oral, resp. rate 20, height 5\' 10"  (1.778 m), weight 69.7 kg, SpO2 99 %.  General appearance: alert, cooperative and no distress Heart: irregularly irregular rhythm Lungs: clear to auscultation bilaterally Abdomen: hyperactive Extremities: extremities normal, atraumatic, no cyanosis or edema Wound: clean and dry   Disposition: Discharge disposition: 01-Home or Self Care       Discharge Instructions     Amb Referral to Cardiac Rehabilitation   Complete by: As directed    Diagnosis: CABG   CABG X ___: 4   After initial evaluation and assessments completed: Virtual Based Care may be provided alone or in conjunction with Phase 2 Cardiac Rehab based on patient barriers.: Yes      Allergies as of 12/27/2019       Reactions   Statins Anaphylaxis   Myalgias   Bismuth Subsalicylate Nausea And Vomiting   Codeine Nausea And Vomiting    Morphine And Related Nausea And Vomiting   Altace [ramipril] Rash        Medication List     STOP taking these medications    APPLE CIDER VINEGAR PO   Co Q-10 [id number] MG Caps   Garlic [id number] MG Tabs   metoprolol succinate 25 MG 24 hr tablet Commonly known as: TOPROL-XL   nitroGLYCERIN 0.4 MG SL tablet Commonly known as: NITROSTAT   Turmeric 500 MG Caps       TAKE these medications    acetaminophen 650 MG CR tablet Commonly known as: TYLENOL Take 1,300 mg by mouth every 8 (eight) hours as needed for pain.   amiodarone 200 MG tablet Commonly known as: PACERONE Take 1 tablet (200 mg total) by mouth 2 (two) times daily.   apixaban 5 MG Tabs tablet Commonly known as: Eliquis Take 1 tablet (5 mg total) by mouth 2 (two) times daily.   aspirin EC 81 MG tablet Take 1 tablet (81 mg total) by mouth daily.   B-complex with vitamin C tablet Take 3 tablets by mouth daily with breakfast.   Calcium-Magnesium-Zinc-D3 Tabs Take 3 tablets by mouth daily with lunch.   cephALEXin 500 MG capsule Commonly known as: Keflex Take 1 capsule (500 mg total) by mouth 3 (three) times daily.   cetirizine 10 MG  tablet Commonly known as: ZYRTEC Take 10 mg by mouth daily.   fluticasone 50 MCG/ACT nasal spray Commonly known as: FLONASE Place 2 sprays into both nostrils daily as needed for allergies.   hydrocortisone 2.5 % rectal cream Commonly known as: ANUSOL-HC Place 1 application rectally 2 (two) times daily.   lansoprazole 15 MG capsule Commonly known as: PREVACID Take 15 mg by mouth daily.   lisinopril 20 MG tablet Commonly known as: ZESTRIL Take 1 tablet (20 mg total) by mouth daily. What changed:  medication strength how much to take   metFORMIN 500 MG tablet Commonly known as: GLUCOPHAGE Take 1 tablet (500 mg total) by mouth 2 (two) times daily with a meal.   metoprolol tartrate 25 MG tablet Commonly known as: LOPRESSOR Take 1 tablet (25 mg total) by mouth 2 (two)  times daily.   olopatadine 0.1 % ophthalmic solution Commonly known as: PATANOL Place 1 drop into both eyes 2 (two) times daily as needed for allergies.   tamsulosin 0.4 MG Caps capsule Commonly known as: FLOMAX Take 0.4 mg by mouth daily after supper.       Follow-up Information     Marton Redwood, MD. Call in 1 day(s).   Specialty: Internal Medicine Contact information: [place] [address] [phone]         Lendon Colonel, NP Follow up.   Specialties: Nurse Practitioner, Radiology, Cardiology Why: Jory Sims, NP 2/10 @1 :45 pm ([facility])  Contact information: [address] [place] [address] [phone]         Ivin Poot, MD Follow up.   Specialty: Cardiothoracic Surgery Why: Your routine follow-up appointment 2/17 at 1:30pm. Please report to [facility] at 1:00pm for a chest xray. [facility] is located on the first floor of our building.  Contact information: [address] [place] [address] [phone]            Signed: John Giovanni 01/01/2020, 9:17 AM The patient has been discharged on:   1.Beta Blocker:  Yes [  y ]                              No   [   ]                              If No, reason:  2.Ace Inhibitor/ARB: Yes [ y  ]                                     No  [    ]                                     If No, reason:  3.Statin:   Yes [   ]                  No  [ n  ]                  If No, reason:anaphlaxis/allergies  4.Shela CommonsVelta Addison  [ y  ]                  No   [   ]  If No, reason:  Discharge instructions reviewed with patient patient examined and medical record reviewed,agree with above note. Tharon Aquas Trigt III 01/01/2020

## 2020-01-13 NOTE — Progress Notes (Signed)
Cardiology Office Note   Date:  01/15/2020   ID:  BLY BAE, DOB 07/27/1946, MRN [id number]  PCP:  Marton Redwood, MD  Cardiologist:  Dr. Martinique  No chief complaint on file.    History of Present Illness: [name] is a 74 y.o. male who presents for post hospitalization follow up after admission for cardiac cath due to abnormal stress test. He was seen by Dr. Martinique on 11/21/2020 with complaints of not feeling well, generalized malaise, nausea, and fleeting chest pain.  BP had been running higher that usual. Other history of Atrial fib on Xarelto, hypertension, Type II diabetes, and hyperlipidemia.    He was admitted on 12/03/2019 for cardiac cath due to his symptoms with multiple risk factors.  Cath revealed severe 3 vessel CAD with multiple areas of stenosis in the LAD, CX and RCA, and branches. He was referred to CVTS for CABG.  He underwent CABG X 4 with LIMA to LAD, SVG to RCA, SVG to OM1 and SVG to diagonal. Endoscopic harvest of left leg greater saphenous vein by Dr. Nils Pyle on 12/27/2019. He has post operative atrial fib and was placed on amiodarone.   He was discharged on ,amiodarone 200 mg BID, Apixaban 5 mg BID, ASA 81 mg daily, lisinopril 20 mg daily,metoprolol 25 mg BID. He is allergic to statins causing nausea and vomiting. Most recent LDL 145.TC 234, HDL 49, TG 182.   [name] comes today stating that he does not feel well.  He has not had the motivation or energy to walk or to be active since coming home from surgery.  He sleeps a lot, after getting up in the morning eating breakfast she often goes back to bed.  And then when he is out of bed he usually lays on the couch most of the day.  He has been taking his garbage out but that is the extent of his activity.  He has been medically compliant but states he is just not felt strong enough to do much of anything.  He denies dyspnea on exertion, severe chest pain although he does continue to have some substernal  soreness, he does have some dizziness when changing positions but he is often supine during the day.  He denies bleeding or excessive bruising on Eliquis.  He states he has been keeping up with his blood sugar but does not always eat correctly.  His wife has been cooking healthy meals but he is not hungry enough to eat them.   Past Medical History:  Diagnosis Date  . Arthritis   . Arthritis   . Atrial fibrillation ([place])   . BPH (benign prostatic hypertrophy)   . CAD (coronary artery disease)    stents placed  . Diabetes mellitus   . Diverticulosis   . Dyspnea    when exerting self  . Dysrhythmia    "extra beats"  . Esophageal stricture   . GERD (gastroesophageal reflux disease)   . Headache(784.0)   . Hemorrhoids   . Hernia, inguinal, right   . Hiatal hernia   . Hyperlipidemia   . Hypertension   . Pneumonia   . Staph infection    Left ankle  . 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 Urinary hesitancy     Past Surgical History:  Procedure Laterality Date  . ANKLE ARTHROPLASTY  2006  . CARDIOVASCULAR STRESS TEST  01/22/2009   EF 51%, NO ISCHEMIA  . CARDIOVASCULAR STRESS TEST    . COLONOSCOPY    .  CORONARY ANGIOPLASTY WITH STENT PLACEMENT  03/2005  . CORONARY ARTERY BYPASS GRAFT N/A 12/20/2019   Procedure: CORONARY ARTERY BYPASS GRAFTING (CABG) times four, using left internal mammary artery and left greater saphenous vein;  Surgeon: Ivin Poot, MD;  Location: [facility];  Service: Open Heart Surgery;  Laterality: N/A;  . CORONARY STENT PLACEMENT  03/10/2005   SUCCESSFUL STENTING OF THE MID AND CRUX OF THE RCA  . ESOPHAGEAL DILATION     multiple procedures  . HERNIA REPAIR  [id number]   umbilical hernia  . INGUINAL HERNIA REPAIR  11/28/2011   Procedure: HERNIA REPAIR INGUINAL ADULT;  Surgeon: Imogene Burn. Georgette Dover, MD;  Location: [facility];  Service: General;  Laterality: Right;  right inguinal hernia repair with mesh  . KNEE SURGERY     Right  . LEFT HEART CATH AND CORONARY  ANGIOGRAPHY N/A 12/03/2019   Procedure: LEFT HEART CATH AND CORONARY ANGIOGRAPHY;  Surgeon: Jettie Booze, MD;  Location: [facility];  Service: Cardiovascular;  Laterality: N/A;  . POLYPECTOMY    . TEE WITHOUT CARDIOVERSION N/A 12/20/2019   Procedure: TRANSESOPHAGEAL ECHOCARDIOGRAM (TEE);  Surgeon: Prescott Gum, Collier Salina, MD;  Location: [facility];  Service: Open Heart Surgery;  Laterality: N/A;  . TONSILLECTOMY  1958  . UMBILICAL HERNIA REPAIR  11/2006  . US ECHOCARDIOGRAPHY       Current Outpatient Medications  Medication Sig Dispense Refill  . acetaminophen (TYLENOL) 650 MG CR tablet Take 1,300 mg by mouth every 8 (eight) hours as needed for pain.    Marland Kitchen amiodarone (PACERONE) 200 MG tablet Take 1 tablet (200 mg total) by mouth daily. 90 tablet 3  . apixaban (ELIQUIS) 5 MG TABS tablet Take 1 tablet (5 mg total) by mouth 2 (two) times daily. 60 tablet 0  . aspirin EC 81 MG tablet Take 1 tablet (81 mg total) by mouth daily.    . B Complex-C (B-COMPLEX WITH VITAMIN C) tablet Take 3 tablets by mouth daily with breakfast.    . cetirizine (ZYRTEC) 10 MG tablet Take 10 mg by mouth daily.      . fluticasone (FLONASE) 50 MCG/ACT nasal spray Place 2 sprays into both nostrils daily as needed for allergies.     . hydrocortisone (ANUSOL-HC) 2.5 % rectal cream Place 1 application rectally 2 (two) times daily.    . lansoprazole (PREVACID) 15 MG capsule Take 15 mg by mouth daily.      Marland Kitchen lisinopril (ZESTRIL) 20 MG tablet Take 1 tablet (20 mg total) by mouth daily. 30 tablet 1  . metFORMIN (GLUCOPHAGE) 500 MG tablet Take 1 tablet (500 mg total) by mouth 2 (two) times daily with a meal.    . metoprolol tartrate (LOPRESSOR) 25 MG tablet Take 1 tablet (25 mg total) by mouth 2 (two) times daily. 60 tablet 1  . Multiple Minerals-Vitamins (CALCIUM-MAGNESIUM-ZINC-D3) TABS Take 3 tablets by mouth daily with lunch.    . olopatadine (PATANOL) 0.1 % ophthalmic solution Place 1 drop into both eyes 2 (two) times daily  as needed for allergies.    . Tamsulosin HCl (FLOMAX) 0.4 MG CAPS Take 0.4 mg by mouth daily after supper.      No current facility-administered medications for this visit.    Allergies:   Statins, Bismuth subsalicylate, Codeine, Morphine and related, and Altace [ramipril]    Social History:  The patient  reports that he quit smoking about 5 years ago. His smoking use included cigarettes and pipe. He has a 22.00 pack-year smoking  history. He has never used smokeless tobacco. He reports that he does not drink alcohol or use drugs.   Family History:  The patient's family history includes Cancer in his paternal grandmother; Heart disease in his brother, maternal grandfather, mother, and sister; Ovarian cancer in his paternal grandmother.    ROS: All other systems are reviewed and negative. Unless otherwise mentioned in H&P    PHYSICAL EXAM: VS:  BP 116/66   Pulse 65   Ht 5\' 10"  (1.778 m)   Wt 152 lb 9.6 oz (69.2 kg)   BMI 21.90 kg/m  , BMI Body mass index is 21.9 kg/m. GEN: Well nourished, well developed, in no acute distress HEENT: normal Neck: no JVD, carotid bruits, or masses Cardiac: RRR; no murmurs, rubs, or gallops,no edema  Respiratory:  Clear to auscultation bilaterally, normal work of breathing GI: soft, nontender, nondistended, + BS MS: no deformity or atrophy.  Well-healed sternotomy incision without evidence of bleeding, bruising, or erythema.  Right leg SVG harvest site is well-healed. Skin: warm and dry, no rash Neuro:  Strength and sensation are intact Psych: euthymic mood, full affect   [id number] reviewed. Normal sinus rhythm heart rate of 65 bpm evidence of anterior infarct with left axis deviation.  Recent Labs: 12/21/2019: ALT 16; Magnesium 2.3 12/24/2019: Hemoglobin 9.4; Platelets 150 12/25/2019: BUN 20; Creatinine, Ser 1.17; Potassium 3.8; Sodium 138    Lipid Panel    Component Value Date/Time   CHOL 234 (H) 12/15/2019 1914   TRIG 182 (H)  12/15/2019 1914   HDL 49 12/15/2019 1914   CHOLHDL 4.8 12/15/2019 1914   VLDL 36 12/15/2019 1914   LDLCALC 149 (H) 12/15/2019 1914      Wt Readings from Last 3 Encounters:  01/15/20 152 lb 9.6 oz (69.2 kg)  12/27/19 153 lb 9.6 oz (69.7 kg)  12/18/19 158 lb (71.7 kg)      Other studies Reviewed: Cardiac Cath 12/03/2019  2nd Mrg lesion is 80% stenosed.  Dist Cx lesion is 90% stenosed.  Mid LAD-1 lesion is 80% stenosed.  Mid LAD-2 lesion is 75% stenosed.  Prox RCA lesion is 75% stenosed.  RPAV lesion is 95% stenosed.  Mid RCA lesion is 30% stenosed.  Dist RCA lesion is 30% stenosed.  Prox LAD lesion is 25% stenosed.  The left ventricular systolic function is normal.  The left ventricular ejection fraction is 50-55% by visual estimate.  LV end diastolic pressure is normal.  There is no aortic valve stenosis.   Severe three vessel CAD.  Will refer for CABG as outpatient given minimal symptoms. I discussed the findings with the wife and explained to her that his activity should be restricted.   Echocardiogram 12/18/2019 1. Left ventricular ejection fraction, by visual estimation, is 55 to  60%. The left ventricle has normal function. There is no left ventricular  hypertrophy.  2. Left ventricular diastolic parameters are indeterminate.  3. The left ventricle has no regional wall motion abnormalities.  4. Global right ventricle has normal systolic function.The right  ventricular size is normal. No increase in right ventricular wall  thickness.  5. Left atrial size was mildly dilated.  6. Right atrial size was mildly dilated.  7. The mitral valve is normal in structure. No evidence of mitral valve  regurgitation. No evidence of mitral stenosis.  8. The tricuspid valve is normal in structure.  9. The aortic valve is normal in structure. Aortic valve regurgitation is  not visualized. No evidence of aortic valve sclerosis or  stenosis.  10. The pulmonic valve  was normal in structure. Pulmonic valve  regurgitation is not visualized.  11. The atrial septum is grossly normal.   ASSESSMENT AND PLAN:  1.  CAD: Status post CABG x4 with LIMA to LAD, SVG to RCA, SVG to OM1 and SVG to diagonal.  The patient has not had any energy since returning home from the hospital but has been quite sedentary, sleeping a lot, no appetite, but medically compliant.  I am uncertain if this is related to some transient depression versus medication.  We will decrease his amiodarone to 200 mg daily from 200 twice daily as he has soft blood pressure today.  Perhaps giving him a little more blood pressure will allow him to feel better and want to be more active.  He will continue on lisinopril, and metoprolol.  2.  Postoperative atrial fib: He is in normal sinus rhythm today continues on Eliquis and amiodarone with decrease in amiodarone to 200 mg daily now.  He offers no complaints of bleeding or excessive bruising.  3.  Non-insulin-dependent diabetes: He is to follow-up with his PCP for ongoing management.  He is not always compliant with his diabetic diet however he is not eating very much lately as he has no appetite.  I have advised him to be very diligent on checking his blood glucose and to report any exaggerations to his primary care physician.  He will need follow-up labs which can be completed by PCP  4.  Hyperlipidemia: He does not tolerate statin therapy.  Will likely need to be referred to our pharmacy/lipid clinic for discussion of institution of [place].  Goal of LDL less than 70.  Current medicines are reviewed at length with the patient today.  I have spent 40  minutes dedicated to the care of this patient on the date of this encounter to include pre-visit review of records, assessment, management and diagnostic testing,with shared decision making.   Labs/ tests ordered today include: Due to be drawn on follow-up with Dr. Darcey Nora.  If not completed he will need to  have fasting lipids and LFTs on next appointment.  [name], ANP, AACC   01/15/2020 2:17 PM    [facility] [place] [address] Office [phone] Fax [phone]  Notice: This dictation was prepared with Dragon dictation along with smaller phrase technology. Any transcriptional errors that result from this process are unintentional and may not be corrected upon review.

## 2020-01-15 ENCOUNTER — Ambulatory Visit: Payer: BC Managed Care – PPO | Admitting: Adult Health

## 2020-01-15 ENCOUNTER — Other Ambulatory Visit: Payer: Self-pay

## 2020-01-15 ENCOUNTER — Encounter: Payer: Self-pay | Admitting: Adult Health

## 2020-01-15 VITALS — BP 116/66 | HR 65 | Ht 70.0 in | Wt 152.6 lb

## 2020-01-15 DIAGNOSIS — I2510 Atherosclerotic heart disease of native coronary artery without angina pectoris: Secondary | ICD-10-CM | POA: Diagnosis not present

## 2020-01-15 DIAGNOSIS — I480 Paroxysmal atrial fibrillation: Secondary | ICD-10-CM | POA: Diagnosis not present

## 2020-01-15 DIAGNOSIS — I10 Essential (primary) hypertension: Secondary | ICD-10-CM | POA: Diagnosis not present

## 2020-01-15 DIAGNOSIS — Z951 Presence of aortocoronary bypass graft: Secondary | ICD-10-CM | POA: Diagnosis not present

## 2020-01-15 MED ORDER — AMIODARONE HCL 200 MG PO TABS: 200.0000 mg | ORAL_TABLET | Freq: Every day | ORAL | 3 refills | Status: DC

## 2020-01-15 NOTE — Patient Instructions (Addendum)
Medication Instructions:  DECREASE- Amiodarone 200 mg by mouth daily  *If you need a refill on your cardiac medications before your next appointment, please call your pharmacy*  Lab Work: None Ordered  Testing/Procedures: None Ordered  Follow-Up: At [facility], you and your health needs are our priority.  As part of our continuing mission to provide you with exceptional heart care, we have created designated Provider Care Teams.  These Care Teams include your primary Cardiologist (physician) and Advanced Practice Providers (APPs -  Physician Assistants and Nurse Practitioners) who all work together to provide you with the care you need, when you need it.  Your next appointment:   Keep appointment with Dr Martinique March 24th @ 2:00 pm  The format for your next appointment:   In Person  Provider:   Peter Martinique, MD   [facility]  Walking is a great form of exercise to increase your strength, endurance and overall fitness.  A walking program can help you start slowly and gradually build endurance as you go.  Everyone's ability is different, so each person's starting point will be different.  You do not have to follow them exactly.  The are just samples. You should simply find out what's right for you and stick to that program.   In the beginning, you'll start off walking 2-3 times a day for short distances.  As you get stronger, you'll be walking further at just 1-2 times per day.  A. You Can Walk For A Certain Length Of Time Each Day    Walk 5 minutes 3 times per day.  Increase 2 minutes every 2 days (3 times per day).  Work up to 25-30 minutes (1-2 times per day).   Example:   Day 1-2 5 minutes 3 times per day   Day 7-8 12 minutes 2-3 times per day   Day 13-14 25 minutes 1-2 times per day  B. You Can Walk For a Certain Distance Each Day     Distance can be substituted for time.    Example:   3 trips to mailbox (at road)   3 trips to corner of block   3 trips around the  block  C. Go to local [employer] and use the track.    Walk for distance  around track  Or time 6 minutes  D. Walk  Jog   Run   Please only do the exercises that your therapist has initialed and dated

## 2020-01-21 ENCOUNTER — Other Ambulatory Visit: Payer: Self-pay | Admitting: Cardiothoracic Surgery

## 2020-01-21 DIAGNOSIS — Z951 Presence of aortocoronary bypass graft: Secondary | ICD-10-CM

## 2020-01-22 ENCOUNTER — Other Ambulatory Visit: Payer: Self-pay

## 2020-01-22 ENCOUNTER — Ambulatory Visit
Admission: RE | Admit: 2020-01-22 | Discharge: 2020-01-22 | Disposition: A | Discharge status: Home / Self Care | Payer: BC Managed Care – PPO | Source: Ambulatory Visit | Attending: Cardiothoracic Surgery | Admitting: Cardiothoracic Surgery

## 2020-01-22 ENCOUNTER — Ambulatory Visit (INDEPENDENT_AMBULATORY_CARE_PROVIDER_SITE_OTHER): Payer: Self-pay | Admitting: Cardiothoracic Surgery

## 2020-01-22 ENCOUNTER — Encounter: Payer: Self-pay | Admitting: Cardiothoracic Surgery

## 2020-01-22 ENCOUNTER — Other Ambulatory Visit: Payer: Self-pay | Admitting: *Deleted

## 2020-01-22 VITALS — BP 149/77 | HR 63 | Temp 97.7°F | Resp 20 | Ht 70.0 in | Wt 152.0 lb

## 2020-01-22 DIAGNOSIS — I2510 Atherosclerotic heart disease of native coronary artery without angina pectoris: Secondary | ICD-10-CM

## 2020-01-22 DIAGNOSIS — Z951 Presence of aortocoronary bypass graft: Secondary | ICD-10-CM

## 2020-01-22 DIAGNOSIS — Z09 Encounter for follow-up examination after completed treatment for conditions other than malignant neoplasm: Secondary | ICD-10-CM

## 2020-01-22 DIAGNOSIS — I200 Unstable angina: Secondary | ICD-10-CM

## 2020-01-22 NOTE — Progress Notes (Signed)
PCP is Marton Redwood, MD Referring Provider is Marton Redwood, MD  Chief Complaint  Patient presents with  . Routine Post Op    f/u from surgery with CXR s/p CABG  Patient examined, today's chest x-ray personally reviewed  HPI: 74 year old gentleman returns for postop visit 1 month after urgent CABG x4.  He presented with unstable angina after having multiple previous stents.  He was stable postop but did have some transient atrial fibrillation which converted to sinus rhythm with amiodarone.  He has continued amiodarone and Eliquis at home.  His surgical incisions are healing well. He denies angina or shortness of breath. He has had no leg edema. He has lost 4-5 pounds due to food not tasting well, probably related to amiodarone. No bleeding complications from the Eliquis.   Past Medical History:  Diagnosis Date  . Arthritis   . Arthritis   . Atrial fibrillation ([place])   . BPH (benign prostatic hypertrophy)   . CAD (coronary artery disease)    stents placed  . Diabetes mellitus   . Diverticulosis   . Dyspnea    when exerting self  . Dysrhythmia    "extra beats"  . Esophageal stricture   . GERD (gastroesophageal reflux disease)   . Headache(784.0)   . Hemorrhoids   . Hernia, inguinal, right   . Hiatal hernia   . Hyperlipidemia   . Hypertension   . Pneumonia   . Staph infection    Left ankle  . 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 Urinary hesitancy     Past Surgical History:  Procedure Laterality Date  . ANKLE ARTHROPLASTY  2006  . CARDIOVASCULAR STRESS TEST  01/22/2009   EF 51%, NO ISCHEMIA  . CARDIOVASCULAR STRESS TEST    . COLONOSCOPY    . CORONARY ANGIOPLASTY WITH STENT PLACEMENT  03/2005  . CORONARY ARTERY BYPASS GRAFT N/A 12/20/2019   Procedure: CORONARY ARTERY BYPASS GRAFTING (CABG) times four, using left internal mammary artery and left greater saphenous vein;  Surgeon: Ivin Poot, MD;  Location: [place] [facility];  Service: Open Heart Surgery;  Laterality:  N/A;  . CORONARY STENT PLACEMENT  03/10/2005   SUCCESSFUL STENTING OF THE MID AND CRUX OF THE RCA  . ESOPHAGEAL DILATION     multiple procedures  . HERNIA REPAIR  [id number]   umbilical hernia  . INGUINAL HERNIA REPAIR  11/28/2011   Procedure: HERNIA REPAIR INGUINAL ADULT;  Surgeon: Imogene Burn. Georgette Dover, MD;  Location: [facility];  Service: General;  Laterality: Right;  right inguinal hernia repair with mesh  . KNEE SURGERY     Right  . LEFT HEART CATH AND CORONARY ANGIOGRAPHY N/A 12/03/2019   Procedure: LEFT HEART CATH AND CORONARY ANGIOGRAPHY;  Surgeon: Jettie Booze, MD;  Location: [facility];  Service: Cardiovascular;  Laterality: N/A;  . POLYPECTOMY    . TEE WITHOUT CARDIOVERSION N/A 12/20/2019   Procedure: TRANSESOPHAGEAL ECHOCARDIOGRAM (TEE);  Surgeon: Prescott Gum, Collier Salina, MD;  Location: [facility];  Service: Open Heart Surgery;  Laterality: N/A;  . TONSILLECTOMY  1958  . UMBILICAL HERNIA REPAIR  11/2006  . US ECHOCARDIOGRAPHY      Family History  Problem Relation Age of Onset  . Heart disease Mother   . Heart disease Sister   . Heart disease Brother   . Heart disease Maternal Grandfather   . Ovarian cancer Paternal Grandmother   . Cancer Paternal Grandmother        cervical  . Colon cancer Neg Hx   .  Esophageal cancer Neg Hx   . Stomach cancer Neg Hx     Social History Social History   Tobacco Use  . Smoking status: Former Smoker    Packs/day: 1.00    Years: 22.00    Pack years: 22.00    Types: Cigarettes, Pipe    Quit date: 12/17/2014    Years since quitting: 5.1  . Smokeless tobacco: Never Used  Substance Use Topics  . Alcohol use: No    Alcohol/week: 0.0 standard drinks  . Drug use: No    Current Outpatient Medications  Medication Sig Dispense Refill  . acetaminophen (TYLENOL) 650 MG CR tablet Take 1,300 mg by mouth every 8 (eight) hours as needed for pain.    Marland Kitchen amiodarone (PACERONE) 200 MG tablet Take 1 tablet (200 mg total) by mouth daily. 90 tablet 3   . apixaban (ELIQUIS) 5 MG TABS tablet Take 1 tablet (5 mg total) by mouth 2 (two) times daily. 60 tablet 0  . aspirin EC 81 MG tablet Take 1 tablet (81 mg total) by mouth daily.    . B Complex-C (B-COMPLEX WITH VITAMIN C) tablet Take 3 tablets by mouth daily with breakfast.    . cetirizine (ZYRTEC) 10 MG tablet Take 10 mg by mouth daily.      . fluticasone (FLONASE) 50 MCG/ACT nasal spray Place 2 sprays into both nostrils daily as needed for allergies.     . hydrocortisone (ANUSOL-HC) 2.5 % rectal cream Place 1 application rectally 2 (two) times daily.    . lansoprazole (PREVACID) 15 MG capsule Take 15 mg by mouth daily.      Marland Kitchen lisinopril (ZESTRIL) 20 MG tablet Take 1 tablet (20 mg total) by mouth daily. 30 tablet 1  . metFORMIN (GLUCOPHAGE) 500 MG tablet Take 1 tablet (500 mg total) by mouth 2 (two) times daily with a meal.    . metoprolol tartrate (LOPRESSOR) 25 MG tablet Take 1 tablet (25 mg total) by mouth 2 (two) times daily. 60 tablet 1  . Multiple Minerals-Vitamins (CALCIUM-MAGNESIUM-ZINC-D3) TABS Take 3 tablets by mouth daily with lunch.    . olopatadine (PATANOL) 0.1 % ophthalmic solution Place 1 drop into both eyes 2 (two) times daily as needed for allergies.    . Tamsulosin HCl (FLOMAX) 0.4 MG CAPS Take 0.4 mg by mouth daily after supper.      No current facility-administered medications for this visit.    Allergies  Allergen Reactions  . Statins Anaphylaxis    Myalgias   . Bismuth Subsalicylate Nausea And Vomiting  . Codeine Nausea And Vomiting  . Morphine And Related Nausea And Vomiting  . Altace [Ramipril] Rash    Review of Systems  No fever Poor appetite Walking 20 minutes daily No sternal popping or clicking sensation  BP (!) 149/77   Pulse 63   Temp 97.7 F (36.5 C) (Skin)   Resp 20   Ht 5\' 10"  (1.778 m)   Wt 152 lb (68.9 kg)   SpO2 98%   BMI 21.81 kg/m  Physical Exam      Exam    General- alert and comfortable.  Sternal incision well-healed.   Neck-  no JVD, no cervical adenopathy palpable, no carotid bruit   Lungs- clear without rales, wheezes   Cor- regular rate and rhythm, no murmur , gallop   Abdomen- soft, non-tender   Extremities - warm, non-tender, minimal edema   Neuro- oriented, appropriate, no focal weakness   Diagnostic Tests: Chest x-ray personally reviewed-clear postop  no pleural effusion.  Sternal wires intact.  Impression: Doing well 1 month after urgent CABG x4. Patient will stop his amiodarone which should help improve his appetite and allow him to gain some weight back He will continue Eliquis and his other cardiac meds He is now able to drive and lift up [id number] pounds.  Plan: Plan on seeing the patient back in 8 weeks to assess his progress.   Len Childs, MD [facility] [phone]

## 2020-01-25 ENCOUNTER — Other Ambulatory Visit: Payer: Self-pay | Admitting: Physician Assistant

## 2020-01-28 ENCOUNTER — Other Ambulatory Visit: Payer: Self-pay | Admitting: Physician Assistant

## 2020-01-28 ENCOUNTER — Telehealth: Payer: Self-pay | Admitting: Cardiology

## 2020-01-28 ENCOUNTER — Other Ambulatory Visit: Payer: Self-pay | Admitting: *Deleted

## 2020-01-28 DIAGNOSIS — Z951 Presence of aortocoronary bypass graft: Secondary | ICD-10-CM

## 2020-01-28 MED ORDER — APIXABAN 5 MG PO TABS: 5.0000 mg | ORAL_TABLET | Freq: Two times a day (BID) | ORAL | 1 refills | Status: DC

## 2020-01-28 NOTE — Telephone Encounter (Signed)
New Message  Pt c/o medication issue:  1. Name of Medication: apixaban (ELIQUIS) 5 MG TABS tablet  2. How are you currently taking this medication (dosage and times per day)? As written  3. Are you having a reaction (difficulty breathing--STAT)? No  4. What is your medication issue? Out of refills and medication. Pt needs a new prescription  Wife of pt also mentioned that the surgeon took him off of amiodarone (PACERONE) 200 MG tablet because he is out of afib. Please call to discuss with pt.

## 2020-01-28 NOTE — Telephone Encounter (Signed)
Spoke with patient's spouse. She reports patient needs a new prescription for Eliquis. Prescription refill sent to pharmacy of choice. She also wanted to let Dr. Martinique know the surgeon took patient off of Amiodarone. She does not want it restarted if possible. She reports patient is feeling better since being off of the Amiodarone and is eating better. Will route to MD so he is aware.

## 2020-01-28 NOTE — Telephone Encounter (Signed)
Agree  Priti Consoli MD, FACC   

## 2020-02-18 DIAGNOSIS — Z006 Encounter for examination for normal comparison and control in clinical research program: Secondary | ICD-10-CM

## 2020-02-18 NOTE — Research (Signed)
[place] Study  Randomized Arm  I have left multiple messages on the patients phone as well as his spouse since 01-13-20 regarding follow up for the [employer]  study that he participated in. I have also emailed the patient regarding this without any response. Will continue trying to reach the patient.

## 2020-02-21 NOTE — Progress Notes (Deleted)
Cardiology Office Note   Date:  02/21/2020   ID:  DAVANTE BEITLER, DOB [birth year], MRN [id number]  PCP:  Marton Redwood, MD  Cardiologist:  Dr. Martinique  No chief complaint on file.    History of Present Illness: [name] is a 74 y.o. male who presents for follow up CAD. He was seen in Dec  with complaints of not feeling well, generalized malaise, nausea, and fleeting chest pain.  BP had been running higher that usual. Myoview was abnormal with inferior and apical ischemia and EF 34%. Other history of Atrial fib on Xarelto, hypertension, Type II diabetes, and hyperlipidemia.    He was admitted on 12/03/2019. Cath revealed severe 3 vessel CAD with multiple areas of stenosis in the LAD, CX and RCA, and branches. He was referred to CVTS for CABG.  He underwent CABG X 4 with LIMA to LAD, SVG to RCA, SVG to OM1 and SVG to diagonal by Dr. Nils Pyle on 12/27/2019. He has post operative atrial fib and was placed on amiodarone.   He was discharged on ,amiodarone 200 mg BID, Apixaban 5 mg BID, ASA 81 mg daily, lisinopril 20 mg daily,metoprolol 25 mg BID. He is allergic to statins causing nausea and vomiting. Most recent LDL 145.TC 234, HDL 49, TG 182.   His amiodarone was discontinued after last visit with Dr Prescott Gum in February.     Mr. Stelmach comes today stating that he does not feel well.  He has not had the motivation or energy to walk or to be active since coming home from surgery.  He sleeps a lot, after getting up in the morning eating breakfast she often goes back to bed.  And then when he is out of bed he usually lays on the couch most of the day.  He has been taking his garbage out but that is the extent of his activity.  He has been medically compliant but states he is just not felt strong enough to do much of anything.  He denies dyspnea on exertion, severe chest pain although he does continue to have some substernal soreness, he does have some dizziness when changing positions but he  is often supine during the day.  He denies bleeding or excessive bruising on Eliquis.  He states he has been keeping up with his blood sugar but does not always eat correctly.  His wife has been cooking healthy meals but he is not hungry enough to eat them.   Past Medical History:  Diagnosis Date  . Arthritis   . Arthritis   . Atrial fibrillation ([place])   . BPH (benign prostatic hypertrophy)   . CAD (coronary artery disease)    stents placed  . Diabetes mellitus   . Diverticulosis   . Dyspnea    when exerting self  . Dysrhythmia    "extra beats"  . Esophageal stricture   . GERD (gastroesophageal reflux disease)   . Headache(784.0)   . Hemorrhoids   . Hernia, inguinal, right   . Hiatal hernia   . Hyperlipidemia   . Hypertension   . Pneumonia   . Staph infection    Left ankle  . 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 Urinary hesitancy     Past Surgical History:  Procedure Laterality Date  . ANKLE ARTHROPLASTY  2006  . CARDIOVASCULAR STRESS TEST  01/22/2009   EF 51%, NO ISCHEMIA  . CARDIOVASCULAR STRESS TEST    . COLONOSCOPY    . CORONARY ANGIOPLASTY  WITH STENT PLACEMENT  03/2005  . CORONARY ARTERY BYPASS GRAFT N/A 12/20/2019   Procedure: CORONARY ARTERY BYPASS GRAFTING (CABG) times four, using left internal mammary artery and left greater saphenous vein;  Surgeon: Ivin Poot, MD;  Location: [facility];  Service: Open Heart Surgery;  Laterality: N/A;  . CORONARY STENT PLACEMENT  03/10/2005   SUCCESSFUL STENTING OF THE MID AND CRUX OF THE RCA  . ESOPHAGEAL DILATION     multiple procedures  . HERNIA REPAIR  [id number]   umbilical hernia  . INGUINAL HERNIA REPAIR  11/28/2011   Procedure: HERNIA REPAIR INGUINAL ADULT;  Surgeon: Imogene Burn. Georgette Dover, MD;  Location: [facility];  Service: General;  Laterality: Right;  right inguinal hernia repair with mesh  . KNEE SURGERY     Right  . LEFT HEART CATH AND CORONARY ANGIOGRAPHY N/A 12/03/2019   Procedure: LEFT HEART CATH AND CORONARY  ANGIOGRAPHY;  Surgeon: Jettie Booze, MD;  Location: [facility];  Service: Cardiovascular;  Laterality: N/A;  . POLYPECTOMY    . TEE WITHOUT CARDIOVERSION N/A 12/20/2019   Procedure: TRANSESOPHAGEAL ECHOCARDIOGRAM (TEE);  Surgeon: Prescott Gum, Collier Salina, MD;  Location: [facility];  Service: Open Heart Surgery;  Laterality: N/A;  . TONSILLECTOMY  1958  . UMBILICAL HERNIA REPAIR  11/2006  . US ECHOCARDIOGRAPHY       Current Outpatient Medications  Medication Sig Dispense Refill  . acetaminophen (TYLENOL) 650 MG CR tablet Take 1,300 mg by mouth every 8 (eight) hours as needed for pain.    Marland Kitchen amiodarone (PACERONE) 200 MG tablet Take 1 tablet (200 mg total) by mouth daily. 90 tablet 3  . apixaban (ELIQUIS) 5 MG TABS tablet Take 1 tablet (5 mg total) by mouth 2 (two) times daily. 60 tablet 1  . aspirin EC 81 MG tablet Take 1 tablet (81 mg total) by mouth daily.    . B Complex-C (B-COMPLEX WITH VITAMIN C) tablet Take 3 tablets by mouth daily with breakfast.    . cetirizine (ZYRTEC) 10 MG tablet Take 10 mg by mouth daily.      . fluticasone (FLONASE) 50 MCG/ACT nasal spray Place 2 sprays into both nostrils daily as needed for allergies.     . hydrocortisone (ANUSOL-HC) 2.5 % rectal cream Place 1 application rectally 2 (two) times daily.    . lansoprazole (PREVACID) 15 MG capsule Take 15 mg by mouth daily.      Marland Kitchen lisinopril (ZESTRIL) 20 MG tablet Take 1 tablet (20 mg total) by mouth daily. 30 tablet 1  . metFORMIN (GLUCOPHAGE) 500 MG tablet Take 1 tablet (500 mg total) by mouth 2 (two) times daily with a meal.    . metoprolol tartrate (LOPRESSOR) 25 MG tablet Take 1 tablet (25 mg total) by mouth 2 (two) times daily. 60 tablet 1  . Multiple Minerals-Vitamins (CALCIUM-MAGNESIUM-ZINC-D3) TABS Take 3 tablets by mouth daily with lunch.    . olopatadine (PATANOL) 0.1 % ophthalmic solution Place 1 drop into both eyes 2 (two) times daily as needed for allergies.    . Tamsulosin HCl (FLOMAX) 0.4 MG CAPS  Take 0.4 mg by mouth daily after supper.      No current facility-administered medications for this visit.    Allergies:   Statins, Bismuth subsalicylate, Codeine, Morphine and related, and Altace [ramipril]    Social History:  The patient  reports that he quit smoking about 5 years ago. His smoking use included cigarettes and pipe. He has a 22.00 pack-year smoking history. He  has never used smokeless tobacco. He reports that he does not drink alcohol or use drugs.   Family History:  The patient's family history includes Cancer in his paternal grandmother; Heart disease in his brother, maternal grandfather, mother, and sister; Ovarian cancer in his paternal grandmother.    ROS: All other systems are reviewed and negative. Unless otherwise mentioned in H&P    PHYSICAL EXAM: VS:  There were no vitals taken for this visit. , BMI There is no height or weight on file to calculate BMI. GEN: Well nourished, well developed, in no acute distress HEENT: normal Neck: no JVD, carotid bruits, or masses Cardiac: RRR; no murmurs, rubs, or gallops,no edema  Respiratory:  Clear to auscultation bilaterally, normal work of breathing GI: soft, nontender, nondistended, + BS MS: no deformity or atrophy.  Well-healed sternotomy incision without evidence of bleeding, bruising, or erythema.  Right leg SVG harvest site is well-healed. Skin: warm and dry, no rash Neuro:  Strength and sensation are intact Psych: euthymic mood, full affect   [id number] reviewed. Normal sinus rhythm heart rate of 65 bpm evidence of anterior infarct with left axis deviation.  Recent Labs: 12/21/2019: ALT 16; Magnesium 2.3 12/24/2019: Hemoglobin 9.4; Platelets 150 12/25/2019: BUN 20; Creatinine, Ser 1.17; Potassium 3.8; Sodium 138  12/18/19: A1c 7.4%  Lipid Panel    Component Value Date/Time   CHOL 234 (H) 12/15/2019 1914   TRIG 182 (H) 12/15/2019 1914   HDL 49 12/15/2019 1914   CHOLHDL 4.8 12/15/2019 1914   VLDL 36  12/15/2019 1914   LDLCALC 149 (H) 12/15/2019 1914      Wt Readings from Last 3 Encounters:  01/22/20 152 lb (68.9 kg)  01/15/20 152 lb 9.6 oz (69.2 kg)  12/27/19 153 lb 9.6 oz (69.7 kg)      Other studies Reviewed: Cardiac Cath 12/03/2019  2nd Mrg lesion is 80% stenosed.  Dist Cx lesion is 90% stenosed.  Mid LAD-1 lesion is 80% stenosed.  Mid LAD-2 lesion is 75% stenosed.  Prox RCA lesion is 75% stenosed.  RPAV lesion is 95% stenosed.  Mid RCA lesion is 30% stenosed.  Dist RCA lesion is 30% stenosed.  Prox LAD lesion is 25% stenosed.  The left ventricular systolic function is normal.  The left ventricular ejection fraction is 50-55% by visual estimate.  LV end diastolic pressure is normal.  There is no aortic valve stenosis.   Severe three vessel CAD.  Will refer for CABG as outpatient given minimal symptoms. I discussed the findings with the wife and explained to her that his activity should be restricted.   Echocardiogram 12/18/2019 1. Left ventricular ejection fraction, by visual estimation, is 55 to  60%. The left ventricle has normal function. There is no left ventricular  hypertrophy.  2. Left ventricular diastolic parameters are indeterminate.  3. The left ventricle has no regional wall motion abnormalities.  4. Global right ventricle has normal systolic function.The right  ventricular size is normal. No increase in right ventricular wall  thickness.  5. Left atrial size was mildly dilated.  6. Right atrial size was mildly dilated.  7. The mitral valve is normal in structure. No evidence of mitral valve  regurgitation. No evidence of mitral stenosis.  8. The tricuspid valve is normal in structure.  9. The aortic valve is normal in structure. Aortic valve regurgitation is  not visualized. No evidence of aortic valve sclerosis or stenosis.  10. The pulmonic valve was normal in structure. Pulmonic valve  regurgitation is  not visualized.  11. The  atrial septum is grossly normal.   ASSESSMENT AND PLAN:  1.  CAD: Status post CABG x4 with LIMA to LAD, SVG to RCA, SVG to OM1 and SVG to diagonal on 12/27/2019 .  The patient has not had any energy since returning home from the hospital but has been quite sedentary, sleeping a lot, no appetite, but medically compliant.  I am uncertain if this is related to some transient depression versus medication.  We will decrease his amiodarone to 200 mg daily from 200 twice daily as he has soft blood pressure today.  Perhaps giving him a little more blood pressure will allow him to feel better and want to be more active.  He will continue on lisinopril, and metoprolol.  2.  Postoperative atrial fib: He is in normal sinus rhythm today continues on Eliquis and amiodarone with decrease in amiodarone to 200 mg daily now.  He offers no complaints of bleeding or excessive bruising.  3.  Non-insulin-dependent diabetes: He is to follow-up with his PCP for ongoing management.  He is not always compliant with his diabetic diet however he is not eating very much lately as he has no appetite.  I have advised him to be very diligent on checking his blood glucose and to report any exaggerations to his primary care physician.  He will need follow-up labs which can be completed by PCP  4.  Hyperlipidemia: He does not tolerate statin therapy.  Will likely need to be referred to our pharmacy/lipid clinic for discussion of institution of [place].  Goal of LDL less than 70.    Jaicee Michelotti Martinique MD, [facility]     02/21/2020 4:20 PM    [facility] [address] Office [phone] Fax [phone]

## 2020-02-24 ENCOUNTER — Telehealth: Payer: Self-pay | Admitting: Cardiology

## 2020-02-24 NOTE — Telephone Encounter (Signed)
Called patient no answer.LMTC. 

## 2020-02-24 NOTE — Telephone Encounter (Signed)
Returned the call to the patient's wife. She stated that they have possibly been exposed to [place] last Friday.  So far they are asymptomatic.  The patient's daughter is getting tested today. The wife stated that they have not been tested yet because it take several days to get an appointment. They are staying quarantined just in case.  The patient has an appointment on Wendesday with Dr. Martinique. She is not sure if the daughter's test will be back by then. She has been advised that the appointment may need to be rescheduled or made virtual but she wants the patient to be seen in the office.  The patient is post-CABG from January.

## 2020-02-24 NOTE — Telephone Encounter (Signed)
Patient's wife, Nevin Bloodgood, states the patient may have been exposed to COVID-19. He has not yet been tested, however to be safe she is requesting to speak with Dr. Doug Sou nurse, Malachy Mood, to advise. Please return call to discuss.

## 2020-02-25 MED ORDER — METOPROLOL TARTRATE 25 MG PO TABS: 25.0000 mg | ORAL_TABLET | Freq: Two times a day (BID) | ORAL | 3 refills | Status: DC

## 2020-02-25 MED ORDER — APIXABAN 5 MG PO TABS: 5.0000 mg | ORAL_TABLET | Freq: Two times a day (BID) | ORAL | 3 refills | Status: DC

## 2020-02-25 NOTE — Telephone Encounter (Signed)
Spoke to patient Dr.Jordan advised to reschedule appointment.Appointment 3/24 cancelled and rescheduled to 03/19/20 at 1:40 pm.Advised to call sooner if needed.

## 2020-02-26 ENCOUNTER — Ambulatory Visit: Payer: BC Managed Care – PPO | Admitting: Cardiology

## 2020-03-12 DIAGNOSIS — Z006 Encounter for examination for normal comparison and control in clinical research program: Secondary | ICD-10-CM

## 2020-03-12 NOTE — Research (Signed)
[place] Study   I have been unable to reach the patient after multiple attempts since 01-13-2020 via phone or e-mail. Certified letter sent to patient at this time.

## 2020-03-16 ENCOUNTER — Other Ambulatory Visit: Payer: Self-pay

## 2020-03-16 ENCOUNTER — Encounter (HOSPITAL_COMMUNITY)
Admission: EM | Disposition: A | Discharge status: Home / Self Care | Payer: Self-pay | Source: Home / Self Care | Attending: Cardiology

## 2020-03-16 ENCOUNTER — Inpatient Hospital Stay (HOSPITAL_COMMUNITY)
Admission: EM | Admit: 2020-03-16 | Discharge: 2020-03-17 | DRG: 247 | Disposition: A | Discharge status: Home / Self Care | Payer: BC Managed Care – PPO | Attending: Cardiology | Admitting: Cardiology

## 2020-03-16 ENCOUNTER — Emergency Department (HOSPITAL_COMMUNITY): Payer: BC Managed Care – PPO

## 2020-03-16 ENCOUNTER — Encounter (HOSPITAL_COMMUNITY): Payer: Self-pay | Admitting: Emergency Medicine

## 2020-03-16 DIAGNOSIS — R079 Chest pain, unspecified: Secondary | ICD-10-CM | POA: Diagnosis present

## 2020-03-16 DIAGNOSIS — Z885 Allergy status to narcotic agent status: Secondary | ICD-10-CM

## 2020-03-16 DIAGNOSIS — I25810 Atherosclerosis of coronary artery bypass graft(s) without angina pectoris: Secondary | ICD-10-CM | POA: Diagnosis present

## 2020-03-16 DIAGNOSIS — E785 Hyperlipidemia, unspecified: Secondary | ICD-10-CM | POA: Diagnosis present

## 2020-03-16 DIAGNOSIS — Z20822 Contact with and (suspected) exposure to covid-19: Secondary | ICD-10-CM | POA: Diagnosis present

## 2020-03-16 DIAGNOSIS — M549 Dorsalgia, unspecified: Secondary | ICD-10-CM | POA: Diagnosis present

## 2020-03-16 DIAGNOSIS — Z87892 Personal history of anaphylaxis: Secondary | ICD-10-CM | POA: Diagnosis not present

## 2020-03-16 DIAGNOSIS — I252 Old myocardial infarction: Secondary | ICD-10-CM | POA: Diagnosis not present

## 2020-03-16 DIAGNOSIS — E119 Type 2 diabetes mellitus without complications: Secondary | ICD-10-CM | POA: Diagnosis present

## 2020-03-16 DIAGNOSIS — H9190 Unspecified hearing loss, unspecified ear: Secondary | ICD-10-CM | POA: Diagnosis present

## 2020-03-16 DIAGNOSIS — Z7982 Long term (current) use of aspirin: Secondary | ICD-10-CM

## 2020-03-16 DIAGNOSIS — E782 Mixed hyperlipidemia: Secondary | ICD-10-CM | POA: Diagnosis not present

## 2020-03-16 DIAGNOSIS — N400 Enlarged prostate without lower urinary tract symptoms: Secondary | ICD-10-CM | POA: Diagnosis present

## 2020-03-16 DIAGNOSIS — Z87891 Personal history of nicotine dependence: Secondary | ICD-10-CM

## 2020-03-16 DIAGNOSIS — I2121 ST elevation (STEMI) myocardial infarction involving left circumflex coronary artery: Secondary | ICD-10-CM | POA: Diagnosis not present

## 2020-03-16 DIAGNOSIS — I25710 Atherosclerosis of autologous vein coronary artery bypass graft(s) with unstable angina pectoris: Secondary | ICD-10-CM | POA: Diagnosis not present

## 2020-03-16 DIAGNOSIS — Z888 Allergy status to other drugs, medicaments and biological substances status: Secondary | ICD-10-CM

## 2020-03-16 DIAGNOSIS — Z7984 Long term (current) use of oral hypoglycemic drugs: Secondary | ICD-10-CM | POA: Diagnosis not present

## 2020-03-16 DIAGNOSIS — I213 ST elevation (STEMI) myocardial infarction of unspecified site: Secondary | ICD-10-CM

## 2020-03-16 DIAGNOSIS — R0789 Other chest pain: Secondary | ICD-10-CM | POA: Diagnosis not present

## 2020-03-16 DIAGNOSIS — Z955 Presence of coronary angioplasty implant and graft: Secondary | ICD-10-CM

## 2020-03-16 DIAGNOSIS — M1990 Unspecified osteoarthritis, unspecified site: Secondary | ICD-10-CM | POA: Diagnosis present

## 2020-03-16 DIAGNOSIS — Z951 Presence of aortocoronary bypass graft: Secondary | ICD-10-CM

## 2020-03-16 DIAGNOSIS — K5790 Diverticulosis of intestine, part unspecified, without perforation or abscess without bleeding: Secondary | ICD-10-CM | POA: Diagnosis present

## 2020-03-16 DIAGNOSIS — Z91018 Allergy to other foods: Secondary | ICD-10-CM | POA: Diagnosis not present

## 2020-03-16 DIAGNOSIS — I2510 Atherosclerotic heart disease of native coronary artery without angina pectoris: Secondary | ICD-10-CM | POA: Diagnosis not present

## 2020-03-16 DIAGNOSIS — Z7901 Long term (current) use of anticoagulants: Secondary | ICD-10-CM

## 2020-03-16 DIAGNOSIS — I25110 Atherosclerotic heart disease of native coronary artery with unstable angina pectoris: Secondary | ICD-10-CM | POA: Diagnosis not present

## 2020-03-16 DIAGNOSIS — I10 Essential (primary) hypertension: Secondary | ICD-10-CM | POA: Diagnosis present

## 2020-03-16 DIAGNOSIS — Z8249 Family history of ischemic heart disease and other diseases of the circulatory system: Secondary | ICD-10-CM | POA: Diagnosis not present

## 2020-03-16 DIAGNOSIS — I480 Paroxysmal atrial fibrillation: Secondary | ICD-10-CM | POA: Diagnosis present

## 2020-03-16 DIAGNOSIS — K219 Gastro-esophageal reflux disease without esophagitis: Secondary | ICD-10-CM | POA: Diagnosis present

## 2020-03-16 DIAGNOSIS — Z7902 Long term (current) use of antithrombotics/antiplatelets: Secondary | ICD-10-CM | POA: Diagnosis not present

## 2020-03-16 DIAGNOSIS — Z79899 Other long term (current) drug therapy: Secondary | ICD-10-CM

## 2020-03-16 HISTORY — PX: CORONARY STENT INTERVENTION: CATH118234

## 2020-03-16 HISTORY — PX: LEFT HEART CATH AND CORS/GRAFTS ANGIOGRAPHY: CATH118250

## 2020-03-16 HISTORY — PX: CORONARY/GRAFT ACUTE MI REVASCULARIZATION: CATH118305

## 2020-03-16 LAB — BASIC METABOLIC PANEL
Anion gap: 10 (ref 5–15)
BUN: 19 mg/dL (ref 8–23)
CO2: 27 mmol/L (ref 22–32)
Calcium: 8.9 mg/dL (ref 8.9–10.3)
Chloride: 103 mmol/L (ref 98–111)
Creatinine, Ser: 1.2 mg/dL (ref 0.61–1.24)
GFR calc Af Amer: >60 mL/min (ref >60)
GFR calc non Af Amer: 59 mL/min — ABNORMAL LOW (ref >60)
Glucose, Bld: 172 mg/dL — ABNORMAL HIGH (ref 70–99)
Potassium: 3.8 mmol/L (ref 3.5–5.1)
Sodium: 140 mmol/L (ref 135–145)

## 2020-03-16 LAB — POCT I-STAT, CHEM 8
BUN: 19 mg/dL (ref 8–23)
Calcium, Ion: 1.2 mmol/L (ref 1.15–1.40)
Chloride: 101 mmol/L (ref 98–111)
Creatinine, Ser: 1.1 mg/dL (ref 0.61–1.24)
Glucose, Bld: 168 mg/dL — ABNORMAL HIGH (ref 70–99)
HCT: 32 % — ABNORMAL LOW (ref 39.0–52.0)
Hemoglobin: 10.9 g/dL — ABNORMAL LOW (ref 13.0–17.0)
Potassium: 3.8 mmol/L (ref 3.5–5.1)
Sodium: 138 mmol/L (ref 135–145)
TCO2: 26 mmol/L (ref 22–32)

## 2020-03-16 LAB — CBC
HCT: 36.5 % — ABNORMAL LOW (ref 39.0–52.0)
Hemoglobin: 11.3 g/dL — ABNORMAL LOW (ref 13.0–17.0)
MCH: 27.3 pg (ref 26.0–34.0)
MCHC: 31 g/dL (ref 30.0–36.0)
MCV: 88.2 fL (ref 80.0–100.0)
Platelets: 172 10*3/uL (ref 150–400)
RBC: 4.14 MIL/uL — ABNORMAL LOW (ref 4.22–5.81)
RDW: 13.3 % (ref 11.5–15.5)
WBC: 5.3 10*3/uL (ref 4.0–10.5)
nRBC: 0 % (ref 0.0–0.2)

## 2020-03-16 LAB — TROPONIN I (HIGH SENSITIVITY)
Troponin I (High Sensitivity): 11 ng/L (ref <18)
Troponin I (High Sensitivity): 99 ng/L — ABNORMAL HIGH (ref <18)
Troponin I (High Sensitivity): 175 ng/L (ref <18)

## 2020-03-16 LAB — LIPID PANEL
Cholesterol: 205 mg/dL — ABNORMAL HIGH (ref 0–200)
HDL: 47 mg/dL (ref >40)
LDL Cholesterol: 137 mg/dL — ABNORMAL HIGH (ref 0–99)
Total CHOL/HDL Ratio: 4.4 RATIO
Triglycerides: 107 mg/dL (ref <150)
VLDL: 21 mg/dL (ref 0–40)

## 2020-03-16 LAB — RESPIRATORY PANEL BY RT PCR (FLU A&B, COVID)
Influenza A by PCR: NEGATIVE
Influenza B by PCR: NEGATIVE
SARS Coronavirus 2 by RT PCR: NEGATIVE

## 2020-03-16 LAB — PROTIME-INR
INR: 1.3 — ABNORMAL HIGH (ref 0.8–1.2)
Prothrombin Time: 16.2 seconds — ABNORMAL HIGH (ref 11.4–15.2)

## 2020-03-16 LAB — POCT ACTIVATED CLOTTING TIME
Activated Clotting Time: 213 seconds
Activated Clotting Time: 450 seconds
Activated Clotting Time: 852 seconds

## 2020-03-16 LAB — APTT: aPTT: 30 seconds (ref 24–36)

## 2020-03-16 LAB — MRSA PCR SCREENING: MRSA by PCR: NEGATIVE

## 2020-03-16 SURGERY — LEFT HEART CATH AND CORS/GRAFTS ANGIOGRAPHY
Anesthesia: LOCAL

## 2020-03-16 MED ORDER — HYDROCORTISONE (PERIANAL) 2.5 % EX CREA: 1.0000 "application " | TOPICAL_CREAM | Freq: Two times a day (BID) | CUTANEOUS | Status: DC | PRN

## 2020-03-16 MED ORDER — CLOPIDOGREL BISULFATE 300 MG PO TABS
ORAL_TABLET | ORAL | Status: DC | PRN
  Administered 2020-03-16: 600 mg via ORAL

## 2020-03-16 MED ORDER — ASPIRIN 81 MG PO CHEW
81.0000 mg | CHEWABLE_TABLET | Freq: Every day | ORAL | Status: DC
  Administered 2020-03-17: 11:00:00 81 mg via ORAL
  Filled 2020-03-16: qty 1

## 2020-03-16 MED ORDER — ASPIRIN 81 MG PO CHEW
324.0000 mg | CHEWABLE_TABLET | Freq: Once | ORAL | Status: AC
  Administered 2020-03-16: 324 mg via ORAL

## 2020-03-16 MED ORDER — ACETAMINOPHEN 325 MG PO TABS: 650.0000 mg | ORAL_TABLET | ORAL | Status: DC | PRN

## 2020-03-16 MED ORDER — CLOPIDOGREL BISULFATE 300 MG PO TABS
ORAL_TABLET | ORAL | Status: AC
  Filled 2020-03-16: qty 2

## 2020-03-16 MED ORDER — SODIUM CHLORIDE 0.9 % IV SOLN: INTRAVENOUS | Status: DC

## 2020-03-16 MED ORDER — LORATADINE 10 MG PO TABS
10.0000 mg | ORAL_TABLET | Freq: Every day | ORAL | Status: DC
  Administered 2020-03-16 – 2020-03-17 (×2): 10 mg via ORAL
  Filled 2020-03-16 (×2): qty 1

## 2020-03-16 MED ORDER — NITROGLYCERIN 1 MG/10 ML FOR IR/CATH LAB
INTRA_ARTERIAL | Status: AC
  Filled 2020-03-16: qty 10

## 2020-03-16 MED ORDER — B COMPLEX-C PO TABS
3.0000 | ORAL_TABLET | Freq: Every day | ORAL | Status: DC
  Administered 2020-03-17: 11:00:00 3 via ORAL
  Filled 2020-03-16: qty 3

## 2020-03-16 MED ORDER — NITROGLYCERIN 1 MG/10 ML FOR IR/CATH LAB
INTRA_ARTERIAL | Status: DC | PRN
  Administered 2020-03-16 (×4): 200 ug via INTRACORONARY

## 2020-03-16 MED ORDER — HEPARIN SODIUM (PORCINE) 1000 UNIT/ML IJ SOLN
INTRAMUSCULAR | Status: AC
  Filled 2020-03-16: qty 1

## 2020-03-16 MED ORDER — SODIUM CHLORIDE 0.9 % IV SOLN
INTRAVENOUS | Status: AC | PRN
  Administered 2020-03-16: 10 mL/h via INTRAVENOUS

## 2020-03-16 MED ORDER — IOHEXOL 350 MG/ML SOLN
INTRAVENOUS | Status: AC
  Filled 2020-03-16: qty 1

## 2020-03-16 MED ORDER — ASPIRIN 81 MG PO CHEW: 81.0000 mg | CHEWABLE_TABLET | Freq: Every day | ORAL | Status: DC

## 2020-03-16 MED ORDER — ONDANSETRON HCL 4 MG/2ML IJ SOLN: 4.0000 mg | Freq: Four times a day (QID) | INTRAMUSCULAR | Status: DC | PRN

## 2020-03-16 MED ORDER — LIDOCAINE HCL (PF) 1 % IJ SOLN
INTRAMUSCULAR | Status: DC | PRN
  Administered 2020-03-16: 2 mL

## 2020-03-16 MED ORDER — SODIUM CHLORIDE 0.9% FLUSH: 3.0000 mL | INTRAVENOUS | Status: DC | PRN

## 2020-03-16 MED ORDER — PANTOPRAZOLE SODIUM 40 MG PO TBEC
40.0000 mg | DELAYED_RELEASE_TABLET | Freq: Every day | ORAL | Status: DC
  Administered 2020-03-16 – 2020-03-17 (×2): 40 mg via ORAL
  Filled 2020-03-16 (×2): qty 1

## 2020-03-16 MED ORDER — SODIUM CHLORIDE 0.9% FLUSH
3.0000 mL | Freq: Two times a day (BID) | INTRAVENOUS | Status: DC
  Administered 2020-03-16: 10 mL via INTRAVENOUS

## 2020-03-16 MED ORDER — FLUTICASONE PROPIONATE 50 MCG/ACT NA SUSP: 2.0000 | Freq: Every day | NASAL | Status: DC | PRN

## 2020-03-16 MED ORDER — LISINOPRIL 20 MG PO TABS
20.0000 mg | ORAL_TABLET | Freq: Every day | ORAL | Status: DC
  Administered 2020-03-16 – 2020-03-17 (×2): 20 mg via ORAL
  Filled 2020-03-16 (×2): qty 1

## 2020-03-16 MED ORDER — SODIUM CHLORIDE 0.9 % IV SOLN: 250.0000 mL | INTRAVENOUS | Status: DC | PRN

## 2020-03-16 MED ORDER — SODIUM CHLORIDE 0.9 % WEIGHT BASED INFUSION
1.0000 mL/kg/h | INTRAVENOUS | Status: AC
  Administered 2020-03-16: 09:00:00 1 mL/kg/h via INTRAVENOUS

## 2020-03-16 MED ORDER — CHLORHEXIDINE GLUCONATE CLOTH 2 % EX PADS
6.0000 | MEDICATED_PAD | Freq: Every day | CUTANEOUS | Status: DC
  Administered 2020-03-16: 15:00:00 6 via TOPICAL

## 2020-03-16 MED ORDER — CALCIUM-MAGNESIUM-ZINC-D3 PO TABS: 3.0000 | ORAL_TABLET | Freq: Every day | ORAL | Status: DC

## 2020-03-16 MED ORDER — VERAPAMIL HCL 2.5 MG/ML IV SOLN
INTRAVENOUS | Status: AC
  Filled 2020-03-16: qty 2

## 2020-03-16 MED ORDER — ENOXAPARIN SODIUM 40 MG/0.4ML ~~LOC~~ SOLN
40.0000 mg | SUBCUTANEOUS | Status: DC
  Filled 2020-03-16: qty 0.4

## 2020-03-16 MED ORDER — METOPROLOL TARTRATE 25 MG PO TABS
25.0000 mg | ORAL_TABLET | Freq: Two times a day (BID) | ORAL | Status: DC
  Administered 2020-03-16 – 2020-03-17 (×3): 25 mg via ORAL
  Filled 2020-03-16 (×3): qty 1

## 2020-03-16 MED ORDER — HEPARIN SODIUM (PORCINE) 5000 UNIT/ML IJ SOLN
INTRAMUSCULAR | Status: AC
  Administered 2020-03-16: 07:00:00 4000 [IU]
  Filled 2020-03-16: qty 1

## 2020-03-16 MED ORDER — VERAPAMIL HCL 2.5 MG/ML IV SOLN
INTRAVENOUS | Status: DC | PRN
  Administered 2020-03-16: 10 mL via INTRA_ARTERIAL

## 2020-03-16 MED ORDER — IOHEXOL 350 MG/ML SOLN
INTRAVENOUS | Status: DC | PRN
  Administered 2020-03-16: 09:00:00 230 mL

## 2020-03-16 MED ORDER — FAMOTIDINE IN NACL 20-0.9 MG/50ML-% IV SOLN
INTRAVENOUS | Status: AC | PRN
  Administered 2020-03-16: 20 mg via INTRAVENOUS

## 2020-03-16 MED ORDER — CLOPIDOGREL BISULFATE 75 MG PO TABS
75.0000 mg | ORAL_TABLET | Freq: Every day | ORAL | Status: DC
  Administered 2020-03-17: 11:00:00 75 mg via ORAL
  Filled 2020-03-16: qty 1

## 2020-03-16 MED ORDER — FAMOTIDINE IN NACL 20-0.9 MG/50ML-% IV SOLN
INTRAVENOUS | Status: AC
  Filled 2020-03-16: qty 50

## 2020-03-16 MED ORDER — SODIUM CHLORIDE 0.9% FLUSH: 3.0000 mL | Freq: Once | INTRAVENOUS | Status: DC

## 2020-03-16 MED ORDER — HEPARIN (PORCINE) IN NACL 1000-0.9 UT/500ML-% IV SOLN
INTRAVENOUS | Status: AC
  Filled 2020-03-16: qty 1000

## 2020-03-16 MED ORDER — HEPARIN SODIUM (PORCINE) 1000 UNIT/ML IJ SOLN
INTRAMUSCULAR | Status: DC | PRN
  Administered 2020-03-16 (×2): 4000 [IU] via INTRAVENOUS

## 2020-03-16 MED ORDER — ASPIRIN EC 81 MG PO TBEC: 81.0000 mg | DELAYED_RELEASE_TABLET | Freq: Every day | ORAL | Status: DC

## 2020-03-16 MED ORDER — LIDOCAINE HCL (PF) 1 % IJ SOLN
INTRAMUSCULAR | Status: AC
  Filled 2020-03-16: qty 30

## 2020-03-16 MED ORDER — HEPARIN (PORCINE) IN NACL 1000-0.9 UT/500ML-% IV SOLN
INTRAVENOUS | Status: DC | PRN
  Administered 2020-03-16 (×2): 500 mL

## 2020-03-16 MED ORDER — TAMSULOSIN HCL 0.4 MG PO CAPS
0.4000 mg | ORAL_CAPSULE | Freq: Every day | ORAL | Status: DC
  Administered 2020-03-16: 19:00:00 0.4 mg via ORAL
  Filled 2020-03-16: qty 1

## 2020-03-16 SURGICAL SUPPLY — 28 items
BALLN EMERGE MR 2.25X12 (BALLOONS) ×2
BALLN SAPPHIRE 2.25X15 (BALLOONS) ×2
BALLN SAPPHIRE ~~LOC~~ 2.25X8 (BALLOONS) ×1 IMPLANT
BALLN SAPPHIRE ~~LOC~~ 2.75X12 (BALLOONS) ×1 IMPLANT
BALLOON EMERGE MR 2.25X12 (BALLOONS) IMPLANT
BALLOON SAPPHIRE 2.25X15 (BALLOONS) IMPLANT
CATH 5FR JL3.5 JR4 ANG PIG MP (CATHETERS) ×1 IMPLANT
CATH EXPO 5F MPA-1 (CATHETERS) ×1 IMPLANT
CATH INFINITI 5 FR IM (CATHETERS) ×1 IMPLANT
CATH INFINITI 5 FR LCB (CATHETERS) ×1 IMPLANT
CATH INFINITI 5 FR RCB (CATHETERS) ×1 IMPLANT
CATH LAUNCHER 6FR EBU3.5 (CATHETERS) ×1 IMPLANT
DEVICE RAD COMP TR BAND LRG (VASCULAR PRODUCTS) ×1 IMPLANT
GLIDESHEATH SLEND SS 6F .021 (SHEATH) ×1 IMPLANT
GUIDEWIRE INQWIRE 1.5J.035X260 (WIRE) IMPLANT
INQWIRE 1.5J .035X260CM (WIRE) ×2
KIT ENCORE 26 ADVANTAGE (KITS) ×1 IMPLANT
KIT HEART LEFT (KITS) ×2 IMPLANT
PACK CARDIAC CATHETERIZATION (CUSTOM PROCEDURE TRAY) ×2 IMPLANT
SHEATH PINNACLE 6F 10CM (SHEATH) IMPLANT
SHEATH PROBE COVER 6X72 (BAG) ×1 IMPLANT
STENT RESOLUTE ONYX 2.25X12 (Permanent Stent) ×2 IMPLANT
STENT RESOLUTE ONYX 2.5X18 (Permanent Stent) ×1 IMPLANT
SYR MEDRAD MARK 7 150ML (SYRINGE) ×2 IMPLANT
TRANSDUCER W/STOPCOCK (MISCELLANEOUS) ×2 IMPLANT
TUBING CIL FLEX 10 FLL-RA (TUBING) ×2 IMPLANT
WIRE ASAHI PROWATER 180CM (WIRE) ×1 IMPLANT
WIRE EMERALD 3MM-J .035X150CM (WIRE) IMPLANT

## 2020-03-16 NOTE — Plan of Care (Signed)
Patient recovering from cath lab. Will continue to work towards meeting discharge outcome

## 2020-03-16 NOTE — ED Provider Notes (Signed)
[facility] EMERG[facility] DEPARTMENT Provider Note   CSN: [id number] Arrival date & time: 03/16/20  [id number]  LEVEL 5 CAVEAT - ACUITY OF CONDITION History Chief Complaint  Patient presents with  . Code STEMI    [name] is a 74 y.o. male.  HPI 74 year old male presents with back pain/axilla pain and shortness of breath.  History is mostly by wife and somewhat by patient though he has a hard time hearing.  Some shortness of breath and chest discomfort yesterday.  Today woke wife up at around 5:30 AM with left-sided back pain and axillary pain.  Feels fine now.  History of CAD.  No meds given this morning.   Past Medical History:  Diagnosis Date  . Arthritis   . Arthritis   . Atrial fibrillation ([place])   . BPH (benign prostatic hypertrophy)   . CAD (coronary artery disease)    stents placed  . Diabetes mellitus   . Diverticulosis   . Dyspnea    when exerting self  . Dysrhythmia    "extra beats"  . Esophageal stricture   . GERD (gastroesophageal reflux disease)   . Headache(784.0)   . Hemorrhoids   . Hernia, inguinal, right   . Hiatal hernia   . Hyperlipidemia   . Hypertension   . Pneumonia   . Staph infection    Left ankle  . 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 Urinary hesitancy     Patient Active Problem List   Diagnosis Date Noted  . Postoperative follow-up 01/22/2020  . 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 Unstable angina ([place]) 12/04/2019  . Abnormal nuclear stress test   . Atrial fibrillation ([place]) 04/21/2016  . Pre-syncope 04/10/2016  . Diabetes ([place]) 04/10/2016  . Hyperlipidemia   . Right inguinal hernia 10/18/2011  . Personal history of colonic polyps 06/13/2011  . Guaiac positive stools 06/13/2011  . GERD (gastroesophageal reflux disease) 06/13/2011  . ARTHRITIS 03/03/2009  . DIABETES MELLITUS 03/02/2009  . Essential hypertension 03/02/2009  . Coronary atherosclerosis 03/02/2009  . HEMORRHOIDS 03/02/2009  . ESOPHAGEAL STRICTURE  03/02/2009  . GERD 03/02/2009  . HIATAL HERNIA 03/02/2009  . POLYP, GALLBLADDER 03/02/2009    Past Surgical History:  Procedure Laterality Date  . ANKLE ARTHROPLASTY  2006  . CARDIOVASCULAR STRESS TEST  01/22/2009   EF 51%, NO ISCHEMIA  . CARDIOVASCULAR STRESS TEST    . COLONOSCOPY    . CORONARY ANGIOPLASTY WITH STENT PLACEMENT  03/2005  . CORONARY ARTERY BYPASS GRAFT N/A 12/20/2019   Procedure: CORONARY ARTERY BYPASS GRAFTING (CABG) times four, using left internal mammary artery and left greater saphenous vein;  Surgeon: Ivin Poot, MD;  Location: [facility];  Service: Open Heart Surgery;  Laterality: N/A;  . CORONARY STENT PLACEMENT  03/10/2005   SUCCESSFUL STENTING OF THE MID AND CRUX OF THE RCA  . ESOPHAGEAL DILATION     multiple procedures  . HERNIA REPAIR  [id number]   umbilical hernia  . INGUINAL HERNIA REPAIR  11/28/2011   Procedure: HERNIA REPAIR INGUINAL ADULT;  Surgeon: Imogene Burn. Georgette Dover, MD;  Location: [facility];  Service: General;  Laterality: Right;  right inguinal hernia repair with mesh  . KNEE SURGERY     Right  . LEFT HEART CATH AND CORONARY ANGIOGRAPHY N/A 12/03/2019   Procedure: LEFT HEART CATH AND CORONARY ANGIOGRAPHY;  Surgeon: Jettie Booze, MD;  Location: [facility];  Service: Cardiovascular;  Laterality: N/A;  . POLYPECTOMY    . TEE  WITHOUT CARDIOVERSION N/A 12/20/2019   Procedure: TRANSESOPHAGEAL ECHOCARDIOGRAM (TEE);  Surgeon: Prescott Gum, Collier Salina, MD;  Location: [facility];  Service: Open Heart Surgery;  Laterality: N/A;  . TONSILLECTOMY  1958  . UMBILICAL HERNIA REPAIR  11/2006  . US ECHOCARDIOGRAPHY         Family History  Problem Relation Age of Onset  . Heart disease Mother   . Heart disease Sister   . Heart disease Brother   . Heart disease Maternal Grandfather   . Ovarian cancer Paternal Grandmother   . Cancer Paternal Grandmother        cervical  . Colon cancer Neg Hx   . Esophageal cancer Neg Hx   . Stomach cancer Neg Hx     Social  History   Tobacco Use  . Smoking status: Former Smoker    Packs/day: 1.00    Years: 22.00    Pack years: 22.00    Types: Cigarettes, Pipe    Quit date: 12/17/2014    Years since quitting: 5.2  . Smokeless tobacco: Never Used  Substance Use Topics  . Alcohol use: No    Alcohol/week: 0.0 standard drinks  . Drug use: No    Home Medications Prior to Admission medications   Medication Sig Start Date End Date Taking? Authorizing Provider  acetaminophen (TYLENOL) 650 MG CR tablet Take 1,300 mg by mouth every 8 (eight) hours as needed for pain.    [provider]  amiodarone (PACERONE) 200 MG tablet Take 1 tablet (200 mg total) by mouth daily. 01/15/20   Lendon Colonel, NP  apixaban (ELIQUIS) 5 MG TABS tablet Take 1 tablet (5 mg total) by mouth 2 (two) times daily. 02/25/20   Martinique, Peter M, MD  aspirin EC 81 MG tablet Take 1 tablet (81 mg total) by mouth daily. 11/22/19   Martinique, Peter M, MD  B Complex-C (B-COMPLEX WITH VITAMIN C) tablet Take 3 tablets by mouth daily with breakfast.    [provider]  cetirizine (ZYRTEC) 10 MG tablet Take 10 mg by mouth daily.      [provider]  fluticasone (FLONASE) 50 MCG/ACT nasal spray Place 2 sprays into both nostrils daily as needed for allergies.     [provider]  hydrocortisone (ANUSOL-HC) 2.5 % rectal cream Place 1 application rectally 2 (two) times daily.    [provider]  lansoprazole (PREVACID) 15 MG capsule Take 15 mg by mouth daily.      [provider]  lisinopril (ZESTRIL) 20 MG tablet Take 1 tablet (20 mg total) by mouth daily. 12/28/19   Elgie Collard, PA-C  metFORMIN (GLUCOPHAGE) 500 MG tablet Take 1 tablet (500 mg total) by mouth 2 (two) times daily with a meal. 12/05/19   Jettie Booze, MD  metoprolol tartrate (LOPRESSOR) 25 MG tablet Take 1 tablet (25 mg total) by mouth 2 (two) times daily. 02/25/20   Martinique, Peter M, MD  Multiple Minerals-Vitamins  (CALCIUM-MAGNESIUM-ZINC-D3) TABS Take 3 tablets by mouth daily with lunch.    [provider]  olopatadine (PATANOL) 0.1 % ophthalmic solution Place 1 drop into both eyes 2 (two) times daily as needed for allergies.    [provider]  Tamsulosin HCl (FLOMAX) 0.4 MG CAPS Take 0.4 mg by mouth daily after supper.     [provider]    Allergies    Statins, Bismuth subsalicylate, Codeine, Morphine and related, and Altace [ramipril]  Review of Systems   Review of Systems  Respiratory: Positive for shortness of breath.   Cardiovascular: Positive for chest pain.  Musculoskeletal: Positive for back pain.  All other systems reviewed and are negative.   Physical Exam Updated Vital Signs BP (!) 159/77 (BP Location: Left Arm)   Pulse 66   Temp 97.6 F (36.4 C) (Oral)   Resp 18   Ht 5\' 10"  (1.778 m)   Wt 80 kg   SpO2 100%   BMI 25.31 kg/m   Physical Exam Vitals and nursing note reviewed.  Constitutional:      General: He is not in acute distress.    Appearance: He is well-developed. He is not ill-appearing or diaphoretic.  HENT:     Head: Normocephalic and atraumatic.     Right Ear: External ear normal.     Left Ear: External ear normal.     Nose: Nose normal.  Eyes:     General:        Right eye: No discharge.        Left eye: No discharge.  Cardiovascular:     Rate and Rhythm: Normal rate and regular rhythm.     Pulses:          Dorsalis pedis pulses are 2+ on the right side and 2+ on the left side.     Heart sounds: Normal heart sounds.  Pulmonary:     Effort: Pulmonary effort is normal.     Breath sounds: Normal breath sounds.  Abdominal:     Palpations: Abdomen is soft.     Tenderness: There is no abdominal tenderness.  Musculoskeletal:     Cervical back: Neck supple.  Skin:    General: Skin is warm and dry.  Neurological:     Mental Status: He is alert.  Psychiatric:        Mood and Affect: Mood is not anxious.     ED Results /  Procedures / Treatments   Labs (all labs ordered are listed, but only abnormal results are displayed) Labs Reviewed  RESPIRATORY PANEL BY RT PCR (FLU A&B, COVID)  BASIC METABOLIC PANEL  CBC  PROTIME-INR  HEMOGLOBIN A1C  APTT  LIPID PANEL  TROPONIN I (HIGH SENSITIVITY)    EKG EKG Interpretation  Date/Time:  Monday March 16 2020 07:05:42 EDT Ventricular Rate:  66 PR Interval:    QRS Duration: 128 QT Interval:  434 QTC Calculation: 455 R Axis:   -78 Text Interpretation: Sinus rhythm Atrial premature complex Nonspecific IVCD with LAD Consider anterior infarct ST elevations laterally c/w STEMI Confirmed by Sherwood Gambler [phone]) on 03/16/2020 7:20:07 AM   Radiology DG Chest 2 View  Result Date: 03/16/2020 CLINICAL DATA:  Chest pain EXAM: CHEST - 2 VIEW COMPARISON:  01/22/2020 FINDINGS: Normal heart size. CABG. Stable aortic tortuosity. There is no edema, consolidation, effusion, or pneumothorax. Calcified granuloma in the left mid lung. No acute osseous finding IMPRESSION: No evidence of acute disease. Electronically Signed   By: Monte Fantasia M.D.   On: 03/16/2020 07:13    Procedures .Critical Care Performed by: Sherwood Gambler, MD Authorized by: Sherwood Gambler, MD   Critical care provider statement:    Critical care time (minutes):  32   Critical care time was exclusive of:  Separately billable procedures and treating other patients   Critical care was necessary to treat or prevent imminent or life-threatening deterioration of the following conditions:  Cardiac failure   Critical care was time spent personally by me on the following activities:  Discussions with consultants, evaluation  of patient's response to treatment, examination of patient, ordering and performing treatments and interventions, ordering and review of laboratory studies, ordering and review of radiographic studies, pulse oximetry, re-evaluation of patient's condition, obtaining history from patient or  surrogate and review of old charts   (including critical care time)  Medications Ordered in ED Medications  sodium chloride flush (NS) 0.9 % injection 3 mL ( Intravenous Automatically Held 03/16/20 0900)  0.9 %  sodium chloride infusion ( Intravenous New Bag/Given 03/16/20 0714)  heparin 5000 UNIT/ML injection (4,000 Units  Given 03/16/20 0715)  aspirin chewable tablet 324 mg (324 mg Oral Given 03/16/20 0715)    ED Course  I have reviewed the triage vital signs and the nursing notes.  Pertinent labs & imaging results that were available during my care of the patient were reviewed by me and considered in my medical decision making (see chart for details).  Clinical Course as of Mar 16 721  Mon Mar 16, 2020  [id number] ECG concerning for ST elevation. D/w Dr. Martinique. Will take to cath lab. HDS.   [SG]    Clinical Course User Index [SG] Sherwood Gambler, MD   MDM Rules/Calculators/A&P                      Patient is not in distress.  However his ECG is concerning for lateral STEMI.  He was given aspirin, heparin bolus, and I discussed with Dr. Martinique.  Asymptomatic currently.  No infectious type symptoms.  Will go emergently to the Cath Lab. Final Clinical Impression(s) / ED Diagnoses Final diagnoses:  ST elevation myocardial infarction (STEMI), unspecified artery [facility])    Rx / DC Orders ED Discharge Orders    None       Sherwood Gambler, MD 03/16/20 [phone]

## 2020-03-16 NOTE — Plan of Care (Signed)
  Problem: Clinical Measurements: Goal: Ability to maintain clinical measurements within normal limits will improve Outcome: Progressing Goal: Will remain free from infection Outcome: Progressing Goal: Diagnostic test results will improve Outcome: Progressing Goal: Respiratory complications will improve Outcome: Progressing Goal: Cardiovascular complication will be avoided Outcome: Progressing   Problem: Activity: Goal: Risk for activity intolerance will decrease Outcome: Progressing   Problem: Nutrition: Goal: Adequate nutrition will be maintained Outcome: Progressing   Problem: Coping: Goal: Level of anxiety will decrease Outcome: Progressing   Problem: Elimination: Goal: Will not experience complications related to bowel motility Outcome: Progressing Goal: Will not experience complications related to urinary retention Outcome: Progressing   Problem: Pain Managment: Goal: General experience of comfort will improve Outcome: Progressing   Problem: Safety: Goal: Ability to remain free from injury will improve Outcome: Progressing   Problem: Skin Integrity: Goal: Risk for impaired skin integrity will decrease Outcome: Progressing   Problem: Activity: Goal: Ability to tolerate increased activity will improve Outcome: Progressing   Problem: Cardiac: Goal: Ability to achieve and maintain adequate cardiopulmonary perfusion will improve Outcome: Progressing Goal: Vascular access site(s) Level 0-1 will be maintained Outcome: Progressing   Problem: Health Behavior/Discharge Planning: Goal: Ability to safely manage health-related needs after discharge will improve Outcome: Progressing

## 2020-03-16 NOTE — Progress Notes (Signed)
   03/16/20 0705  Clinical Encounter Type  Visited With Patient and family together;Health care provider  Visit Type ED;Other (Comment) (STEMI)  Referral From Nurse  Consult/Referral To Chaplain  Spiritual Encounters  Spiritual Needs Prayer;Emotional  Stress Factors  Patient Stress Factors None identified  Family Stress Factors None identified   Chaplain responded to code STEMI with family present. Pete's wife Nevin Bloodgood was in the room with Laurey Arrow as RNs prepared Laurey Arrow for transport to the cath lab. Chaplain introduced self and escorted Nevin Bloodgood to the [facility] waiting area. Chaplain provided ministry of presence and prayer. Nevin Bloodgood is calling the family's pastor for further support. Chaplains remain available for support as needs arise.  Chaplain Resident, Evelene Croon, M Div [phone] on-call pager

## 2020-03-16 NOTE — H&P (Signed)
Cardiology Admission History and Physical:   Patient ID: IRL ABERG MRN: [id number]; DOB: 04-Mar-1946   Admission date: 03/16/2020  Primary Care Provider: Marton Redwood, MD Primary Cardiologist:Dr. Martinique   Chief Complaint:  CP  Patient Profile:   [name] is a 74 y.o. male with history of CAD s/p CABG January 2021,post op atrial fibrillation on anticoagulation, hypertension, type 2 diabetes and hyperlipidemia presented with chest pain and found to have STEMI.  Cath December 2020 revealed three-vessel CAD.  He was seen by cardiothoracic surgeon and underwent CABG x4 with LIMA to LAD, SVG to RCA, SVG to OM1 and SVG to diagonal. Endoscopic harvest of left leg greater saphenous vein by Dr. Nils Pyle on 12/27/2019. He has post operative atrial fib and was placed on amiodarone.   Last seen by APP February 2021.  History of Present Illness:   Mr. Thorstenson woke up this morning at 5:00 with left-sided upper chest pain radiating to his upper back and left axilla.  Associated with shortness of breath.  He did have some chest discomfort yesterday.  EKG showed lateral ST elevation with reciprocal depression in inferior leads.  Code STEMI called and he was brought to Cath Lab emergently.  He received aspirin 324 mg and heparin bolus.  Pending lab.  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   Urinary hesitancy     Past Surgical History:  Procedure  Laterality Date   ANKLE ARTHROPLASTY  2006   CARDIOVASCULAR STRESS TEST  01/22/2009   EF 51%, NO ISCHEMIA   CARDIOVASCULAR STRESS TEST     COLONOSCOPY     CORONARY ANGIOPLASTY WITH STENT PLACEMENT  03/2005   CORONARY ARTERY BYPASS GRAFT N/A 12/20/2019   Procedure: CORONARY ARTERY BYPASS GRAFTING (CABG) times four, using left internal mammary artery and left greater saphenous vein;  Surgeon: Ivin Poot, MD;  Location: [facility];  Service: Open Heart Surgery;  Laterality: N/A;   CORONARY STENT PLACEMENT  03/10/2005   SUCCESSFUL STENTING OF THE MID AND CRUX OF THE RCA   ESOPHAGEAL DILATION     multiple procedures   HERNIA REPAIR  [id number]   umbilical hernia   INGUINAL HERNIA REPAIR  11/28/2011   Procedure: HERNIA REPAIR INGUINAL ADULT;  Surgeon: Imogene Burn. Georgette Dover, MD;  Location: [facility];  Service: General;  Laterality: Right;  right inguinal hernia repair with mesh   KNEE SURGERY     Right   LEFT HEART CATH AND CORONARY ANGIOGRAPHY N/A 12/03/2019   Procedure: LEFT HEART CATH AND CORONARY ANGIOGRAPHY;  Surgeon: Jettie Booze, MD;  Location: [facility];  Service: Cardiovascular;  Laterality: N/A;   POLYPECTOMY     TEE WITHOUT CARDIOVERSION N/A 12/20/2019   Procedure: TRANSESOPHAGEAL ECHOCARDIOGRAM (TEE);  Surgeon: Prescott Gum, Collier Salina, MD;  Location: [facility];  Service: Open  Heart Surgery;  Laterality: N/A;   TONSILLECTOMY  [id number]   UMBILICAL HERNIA REPAIR  11/2006   US ECHOCARDIOGRAPHY       Medications Prior to Admission: Prior to Admission medications   Medication Sig Start Date End Date Taking? Authorizing Provider  acetaminophen (TYLENOL) 650 MG CR tablet Take 1,300 mg by mouth every 8 (eight) hours as needed for pain.    [provider]  amiodarone (PACERONE) 200 MG tablet Take 1 tablet (200 mg total) by mouth daily. 01/15/20   Lendon Colonel, NP  apixaban (ELIQUIS) 5 MG TABS tablet Take 1 tablet (5 mg total) by mouth 2 (two) times daily. 02/25/20    Martinique, Peter M, MD  aspirin EC 81 MG tablet Take 1 tablet (81 mg total) by mouth daily. 11/22/19   Martinique, Peter M, MD  B Complex-C (B-COMPLEX WITH VITAMIN C) tablet Take 3 tablets by mouth daily with breakfast.    [provider]  cetirizine (ZYRTEC) 10 MG tablet Take 10 mg by mouth daily.      [provider]  fluticasone (FLONASE) 50 MCG/ACT nasal spray Place 2 sprays into both nostrils daily as needed for allergies.     [provider]  hydrocortisone (ANUSOL-HC) 2.5 % rectal cream Place 1 application rectally 2 (two) times daily.    [provider]  lansoprazole (PREVACID) 15 MG capsule Take 15 mg by mouth daily.      [provider]  lisinopril (ZESTRIL) 20 MG tablet Take 1 tablet (20 mg total) by mouth daily. 12/28/19   Elgie Collard, PA-C  metFORMIN (GLUCOPHAGE) 500 MG tablet Take 1 tablet (500 mg total) by mouth 2 (two) times daily with a meal. 12/05/19   Jettie Booze, MD  metoprolol tartrate (LOPRESSOR) 25 MG tablet Take 1 tablet (25 mg total) by mouth 2 (two) times daily. 02/25/20   Martinique, Peter M, MD  Multiple Minerals-Vitamins (CALCIUM-MAGNESIUM-ZINC-D3) TABS Take 3 tablets by mouth daily with lunch.    [provider]  olopatadine (PATANOL) 0.1 % ophthalmic solution Place 1 drop into both eyes 2 (two) times daily as needed for allergies.    [provider]  Tamsulosin HCl (FLOMAX) 0.4 MG CAPS Take 0.4 mg by mouth daily after supper.     [provider]     Allergies:    Allergies  Allergen Reactions   Statins Anaphylaxis    Myalgias    Bismuth Subsalicylate Nausea And Vomiting   Codeine Nausea And Vomiting   Morphine And Related Nausea And Vomiting   Altace [Ramipril] Rash    Social History:   Social History   Socioeconomic History   Marital status: Married    Spouse name: Not on file   Number of children: 3   Years of education: Not on file   Highest education level: Not on file   Occupational History   Occupation: Retired  Tobacco Use   Smoking status: Former Smoker    Packs/day: 1.00    Years: 22.00    Pack years: 22.00    Types: Cigarettes, Pipe    Quit date: 12/17/2014    Years since quitting: 5.2   Smokeless tobacco: Never Used  Substance and Sexual Activity   Alcohol use: No    Alcohol/week: 0.0 standard drinks   Drug use: No   Sexual activity: Not on file  Other Topics Concern   Not on file  Social History Narrative   Not on file   Social Determinants of  Health   Financial Resource Strain:    Difficulty of Paying Living Expenses:   Food Insecurity:    Worried About Charity fundraiser in the Last Year:    Arboriculturist in the Last Year:   Transportation Needs:    Film/video editor (Medical):    Lack of Transportation (Non-Medical):   Physical Activity:    Days of Exercise per Week:    Minutes of Exercise per Session:   Stress:    Feeling of Stress :   Social Connections:    Frequency of Communication with Friends and Family:    Frequency of Social Gatherings with Friends and Family:    Attends Religious Services:    Active Member of Clubs or Organizations:    Attends Music therapist:    Marital Status:   Intimate Partner Violence:    Fear of Current or Ex-Partner:    Emotionally Abused:    Physically Abused:    Sexually Abused:     Family History:   The patient's family history includes Cancer in his paternal grandmother; Heart disease in his brother, maternal grandfather, mother, and sister; Ovarian cancer in his paternal grandmother. There is no history of Colon cancer, Esophageal cancer, or Stomach cancer.    ROS:  Please see the history of present illness.  All other ROS reviewed and negative.     Physical Exam/Data:   Vitals:   03/16/20 0630 03/16/20 0637 03/16/20 0730  BP: (!) 159/77    Pulse: 66    Resp: 18    Temp: 97.6 F (36.4 C)    TempSrc: Oral    SpO2: 100%   100%  Weight:  80 kg   Height:  5\' 10"  (1.778 m)    No intake or output data in the 24 hours ending 03/16/20 0739 Last 3 Weights 03/16/2020 01/22/2020 01/15/2020  Weight (lbs) 176 lb 5.9 oz 152 lb 152 lb 9.6 oz  Weight (kg) 80 kg 68.947 kg 69.219 kg     Body mass index is 25.31 kg/m.  General:  Well nourished, well developed, in no acute distress HEENT: normal Lymph: no adenopathy Neck: no JVD Endocrine:  No thryomegaly Vascular: No carotid bruits; FA pulses 2+ bilaterally without bruits  Cardiac:  normal S1, S2; RRR; no murmur  Lungs:  clear to auscultation bilaterally, no wheezing, rhonchi or rales  Abd: soft, nontender, no hepatomegaly  Ext: no edema Musculoskeletal:  No deformities, BUE and BLE strength normal and equal Skin: warm and dry  Neuro:  CNs 2-12 intact, no focal abnormalities noted Psych:  Normal affect    EKG:  The ECG that was done today  was personally reviewed and demonstrates lateral st elevation   Relevant CV Studies:  Cath 12/03/19 LEFT HEART CATH AND CORONARY ANGIOGRAPHY  Conclusion    2nd Mrg lesion is 80% stenosed.  Dist Cx lesion is 90% stenosed.  Mid LAD-1 lesion is 80% stenosed.  Mid LAD-2 lesion is 75% stenosed.  Prox RCA lesion is 75% stenosed.  RPAV lesion is 95% stenosed.  Mid RCA lesion is 30% stenosed.  Dist RCA lesion is 30% stenosed.  Prox LAD lesion is 25% stenosed.  The left ventricular systolic function is normal.  The left ventricular ejection fraction is 50-55% by visual estimate.  LV end diastolic pressure is normal.  There is no aortic valve stenosis.   Severe three vessel CAD.  Will refer for CABG as outpatient given minimal symptoms. I discussed the findings  with the wife and explained to her that his activity should be restricted.    Echo 12/18/19 1. Left ventricular ejection fraction, by visual estimation, is 55 to  60%. The left ventricle has normal function. There is no left ventricular  hypertrophy.    2. Left ventricular diastolic parameters are indeterminate.  3. The left ventricle has no regional wall motion abnormalities.  4. Global right ventricle has normal systolic function.The right  ventricular size is normal. No increase in right ventricular wall  thickness.  5. Left atrial size was mildly dilated.  6. Right atrial size was mildly dilated.  7. The mitral valve is normal in structure. No evidence of mitral valve  regurgitation. No evidence of mitral stenosis.  8. The tricuspid valve is normal in structure.  9. The aortic valve is normal in structure. Aortic valve regurgitation is  not visualized. No evidence of aortic valve sclerosis or stenosis.  10. The pulmonic valve was normal in structure. Pulmonic valve  regurgitation is not visualized.  11. The atrial septum is grossly normal.   Laboratory Data:  High Sensitivity Troponin:  No results for input(s): TROPONINIHS in the last 720 hours.    ChemistryNo results for input(s): NA, K, CL, CO2, GLUCOSE, BUN, CREATININE, CALCIUM, GFRNONAA, GFRAA, ANIONGAP in the last 168 hours.  No results for input(s): PROT, ALBUMIN, AST, ALT, ALKPHOS, BILITOT in the last 168 hours. Hematology Recent Labs  Lab 03/16/20 0646  WBC 5.3  RBC 4.14*  HGB 11.3*  HCT 36.5*  MCV 88.2  MCH 27.3  MCHC 31.0  RDW 13.3  PLT 172   BNPNo results for input(s): BNP, PROBNP in the last 168 hours.  DDimer No results for input(s): DDIMER in the last 168 hours.   Radiology/Studies:  DG Chest 2 View  Result Date: 03/16/2020 CLINICAL DATA:  Chest pain EXAM: CHEST - 2 VIEW COMPARISON:  01/22/2020 FINDINGS: Normal heart size. CABG. Stable aortic tortuosity. There is no edema, consolidation, effusion, or pneumothorax. Calcified granuloma in the left mid lung. No acute osseous finding IMPRESSION: No evidence of acute disease. Electronically Signed   By: Monte Fantasia M.D.   On: 03/16/2020 07:13    HEAR Score (for undifferentiated chest pain):   HEAR Score: 7    Assessment and Plan:   1.  Lateral STEMI -Most recently underwent CABG x4 in January 2021.  Pending cardiac catheterization emergently.  2.  Postop atrial fibrillation -Unknown last dose of Eliquis.  Reports medication managed by wife.  On amiodarone.  Maintaining sinus rhythm.  3.  Hyperlipidemia - Statin intolerance   Note: Patient is enrolled in [employer]. Staff unable to reach the patient.    Severity of Illness: The appropriate patient status for this patient is INPATIENT. Inpatient status is judged to be reasonable and necessary in order to provide the required intensity of service to ensure the patient's safety. The patient's presenting symptoms, physical exam findings, and initial radiographic and laboratory data in the context of their chronic comorbidities is felt to place them at high risk for further clinical deterioration. Furthermore, it is not anticipated that the patient will be medically stable for discharge from the hospital within 2 midnights of admission. The following factors support the patient status of inpatient.   " The patient's presenting symptoms include CP, SOB. " The worrisome physical exam findings include None " The initial radiographic and laboratory data are worrisome because of STEMI " The chronic co-morbidities include recent CABG with post op afib  *  I certify that at the point of admission it is my clinical judgment that the patient will require inpatient hospital care spanning beyond 2 midnights from the point of admission due to high intensity of service, high risk for further deterioration and high frequency of surveillance required.*    For questions or updates, please contact [place] Please consult [email or web address] for contact info under        Jarrett Soho, [place]  03/16/2020 7:39 AM

## 2020-03-16 NOTE — ED Triage Notes (Signed)
Patient woke up this morning with left upper chest pain radiating to left upper back and left axilla , denies chest pain at triage , no SOB , denies emesis or diaphoresis , history of CAD/CABG/Coronary Stents , his cardiologist is Dr. Stark [place]. Lawson Fiscal.

## 2020-03-17 ENCOUNTER — Observation Stay (HOSPITAL_COMMUNITY)
Admission: EM | Admit: 2020-03-17 | Discharge: 2020-03-18 | Disposition: A | Discharge status: Home / Self Care | Payer: BC Managed Care – PPO | Attending: Cardiology | Admitting: Cardiology

## 2020-03-17 ENCOUNTER — Encounter: Payer: Self-pay | Admitting: Cardiology

## 2020-03-17 ENCOUNTER — Telehealth: Payer: Self-pay | Admitting: Cardiology

## 2020-03-17 DIAGNOSIS — K219 Gastro-esophageal reflux disease without esophagitis: Secondary | ICD-10-CM | POA: Insufficient documentation

## 2020-03-17 DIAGNOSIS — I10 Essential (primary) hypertension: Secondary | ICD-10-CM | POA: Diagnosis not present

## 2020-03-17 DIAGNOSIS — I2510 Atherosclerotic heart disease of native coronary artery without angina pectoris: Secondary | ICD-10-CM | POA: Diagnosis present

## 2020-03-17 DIAGNOSIS — I2121 ST elevation (STEMI) myocardial infarction involving left circumflex coronary artery: Secondary | ICD-10-CM | POA: Diagnosis not present

## 2020-03-17 DIAGNOSIS — Z20822 Contact with and (suspected) exposure to covid-19: Secondary | ICD-10-CM | POA: Insufficient documentation

## 2020-03-17 DIAGNOSIS — I25110 Atherosclerotic heart disease of native coronary artery with unstable angina pectoris: Secondary | ICD-10-CM | POA: Diagnosis not present

## 2020-03-17 DIAGNOSIS — I200 Unstable angina: Secondary | ICD-10-CM | POA: Diagnosis present

## 2020-03-17 DIAGNOSIS — I252 Old myocardial infarction: Secondary | ICD-10-CM | POA: Insufficient documentation

## 2020-03-17 DIAGNOSIS — Z885 Allergy status to narcotic agent status: Secondary | ICD-10-CM | POA: Insufficient documentation

## 2020-03-17 DIAGNOSIS — I25710 Atherosclerosis of autologous vein coronary artery bypass graft(s) with unstable angina pectoris: Secondary | ICD-10-CM | POA: Insufficient documentation

## 2020-03-17 DIAGNOSIS — Z006 Encounter for examination for normal comparison and control in clinical research program: Secondary | ICD-10-CM

## 2020-03-17 DIAGNOSIS — Z7982 Long term (current) use of aspirin: Secondary | ICD-10-CM | POA: Insufficient documentation

## 2020-03-17 DIAGNOSIS — Z888 Allergy status to other drugs, medicaments and biological substances status: Secondary | ICD-10-CM | POA: Insufficient documentation

## 2020-03-17 DIAGNOSIS — Z79899 Other long term (current) drug therapy: Secondary | ICD-10-CM | POA: Insufficient documentation

## 2020-03-17 DIAGNOSIS — Z951 Presence of aortocoronary bypass graft: Secondary | ICD-10-CM | POA: Diagnosis not present

## 2020-03-17 DIAGNOSIS — R0789 Other chest pain: Secondary | ICD-10-CM | POA: Diagnosis not present

## 2020-03-17 DIAGNOSIS — M1990 Unspecified osteoarthritis, unspecified site: Secondary | ICD-10-CM | POA: Insufficient documentation

## 2020-03-17 DIAGNOSIS — Z7902 Long term (current) use of antithrombotics/antiplatelets: Secondary | ICD-10-CM | POA: Insufficient documentation

## 2020-03-17 DIAGNOSIS — Z7984 Long term (current) use of oral hypoglycemic drugs: Secondary | ICD-10-CM | POA: Insufficient documentation

## 2020-03-17 DIAGNOSIS — I25810 Atherosclerosis of coronary artery bypass graft(s) without angina pectoris: Secondary | ICD-10-CM

## 2020-03-17 DIAGNOSIS — E785 Hyperlipidemia, unspecified: Secondary | ICD-10-CM | POA: Insufficient documentation

## 2020-03-17 DIAGNOSIS — E782 Mixed hyperlipidemia: Secondary | ICD-10-CM | POA: Diagnosis not present

## 2020-03-17 DIAGNOSIS — Z955 Presence of coronary angioplasty implant and graft: Secondary | ICD-10-CM | POA: Insufficient documentation

## 2020-03-17 DIAGNOSIS — E119 Type 2 diabetes mellitus without complications: Secondary | ICD-10-CM | POA: Insufficient documentation

## 2020-03-17 DIAGNOSIS — I480 Paroxysmal atrial fibrillation: Secondary | ICD-10-CM | POA: Diagnosis not present

## 2020-03-17 DIAGNOSIS — Z8249 Family history of ischemic heart disease and other diseases of the circulatory system: Secondary | ICD-10-CM | POA: Insufficient documentation

## 2020-03-17 DIAGNOSIS — N400 Enlarged prostate without lower urinary tract symptoms: Secondary | ICD-10-CM | POA: Insufficient documentation

## 2020-03-17 DIAGNOSIS — Z87891 Personal history of nicotine dependence: Secondary | ICD-10-CM | POA: Insufficient documentation

## 2020-03-17 LAB — CREATININE, SERUM
Creatinine, Ser: 1.3 mg/dL — ABNORMAL HIGH (ref 0.61–1.24)
GFR calc Af Amer: >60 mL/min (ref >60)
GFR calc non Af Amer: 54 mL/min — ABNORMAL LOW (ref >60)

## 2020-03-17 LAB — CBC
HCT: 35 % — ABNORMAL LOW (ref 39.0–52.0)
HCT: 35.9 % — ABNORMAL LOW (ref 39.0–52.0)
HCT: 37.7 % — ABNORMAL LOW (ref 39.0–52.0)
Hemoglobin: 11.1 g/dL — ABNORMAL LOW (ref 13.0–17.0)
Hemoglobin: 11.2 g/dL — ABNORMAL LOW (ref 13.0–17.0)
Hemoglobin: 11.9 g/dL — ABNORMAL LOW (ref 13.0–17.0)
MCH: 27.7 pg (ref 26.0–34.0)
MCH: 27.3 pg (ref 26.0–34.0)
MCH: 27.7 pg (ref 26.0–34.0)
MCHC: 31.7 g/dL (ref 30.0–36.0)
MCHC: 31.2 g/dL (ref 30.0–36.0)
MCHC: 31.6 g/dL (ref 30.0–36.0)
MCV: 87.3 fL (ref 80.0–100.0)
MCV: 87.3 fL (ref 80.0–100.0)
MCV: 87.7 fL (ref 80.0–100.0)
Platelets: 185 10*3/uL (ref 150–400)
Platelets: 181 10*3/uL (ref 150–400)
Platelets: 179 10*3/uL (ref 150–400)
RBC: 4.01 MIL/uL — ABNORMAL LOW (ref 4.22–5.81)
RBC: 4.11 MIL/uL — ABNORMAL LOW (ref 4.22–5.81)
RBC: 4.3 MIL/uL (ref 4.22–5.81)
RDW: 13.2 % (ref 11.5–15.5)
RDW: 13.3 % (ref 11.5–15.5)
RDW: 13.4 % (ref 11.5–15.5)
WBC: 6.3 10*3/uL (ref 4.0–10.5)
WBC: 5.2 10*3/uL (ref 4.0–10.5)
WBC: 5 10*3/uL (ref 4.0–10.5)
nRBC: 0 % (ref 0.0–0.2)
nRBC: 0 % (ref 0.0–0.2)
nRBC: 0 % (ref 0.0–0.2)

## 2020-03-17 LAB — BASIC METABOLIC PANEL
Anion gap: 10 (ref 5–15)
Anion gap: 12 (ref 5–15)
BUN: 16 mg/dL (ref 8–23)
BUN: 16 mg/dL (ref 8–23)
CO2: 23 mmol/L (ref 22–32)
CO2: 24 mmol/L (ref 22–32)
Calcium: 8.6 mg/dL — ABNORMAL LOW (ref 8.9–10.3)
Calcium: 8.5 mg/dL — ABNORMAL LOW (ref 8.9–10.3)
Chloride: 105 mmol/L (ref 98–111)
Chloride: 102 mmol/L (ref 98–111)
Creatinine, Ser: 1.14 mg/dL (ref 0.61–1.24)
Creatinine, Ser: 1.42 mg/dL — ABNORMAL HIGH (ref 0.61–1.24)
GFR calc Af Amer: >60 mL/min (ref >60)
GFR calc Af Amer: 56 mL/min — ABNORMAL LOW (ref >60)
GFR calc non Af Amer: >60 mL/min (ref >60)
GFR calc non Af Amer: 48 mL/min — ABNORMAL LOW (ref >60)
Glucose, Bld: 106 mg/dL — ABNORMAL HIGH (ref 70–99)
Glucose, Bld: 286 mg/dL — ABNORMAL HIGH (ref 70–99)
Potassium: 4.3 mmol/L (ref 3.5–5.1)
Potassium: 3.9 mmol/L (ref 3.5–5.1)
Sodium: 138 mmol/L (ref 135–145)
Sodium: 138 mmol/L (ref 135–145)

## 2020-03-17 LAB — HEMOGLOBIN A1C
Hgb A1c MFr Bld: 7.2 % — ABNORMAL HIGH (ref 4.8–5.6)
Mean Plasma Glucose: 160 mg/dL

## 2020-03-17 LAB — TROPONIN I (HIGH SENSITIVITY)
Troponin I (High Sensitivity): 105 ng/L (ref <18)
Troponin I (High Sensitivity): 96 ng/L — ABNORMAL HIGH (ref <18)
Troponin I (High Sensitivity): 101 ng/L (ref <18)
Troponin I (High Sensitivity): 94 ng/L — ABNORMAL HIGH (ref <18)

## 2020-03-17 MED ORDER — NITROGLYCERIN 0.4 MG SL SUBL: 0.4000 mg | SUBLINGUAL_TABLET | SUBLINGUAL | 12 refills | Status: DC | PRN

## 2020-03-17 MED ORDER — HEPARIN SODIUM (PORCINE) 5000 UNIT/ML IJ SOLN
5000.0000 [IU] | Freq: Three times a day (TID) | INTRAMUSCULAR | Status: DC
  Administered 2020-03-17 – 2020-03-18 (×2): 5000 [IU] via SUBCUTANEOUS
  Filled 2020-03-17 (×2): qty 1

## 2020-03-17 MED ORDER — B COMPLEX-C PO TABS
1.0000 | ORAL_TABLET | Freq: Every day | ORAL | Status: DC
  Administered 2020-03-18: 13:00:00 1 via ORAL
  Filled 2020-03-17: qty 1

## 2020-03-17 MED ORDER — INSULIN ASPART 100 UNIT/ML ~~LOC~~ SOLN
0.0000 [IU] | Freq: Three times a day (TID) | SUBCUTANEOUS | Status: DC
  Administered 2020-03-18: 10:00:00 3 [IU] via SUBCUTANEOUS

## 2020-03-17 MED ORDER — ONDANSETRON HCL 4 MG/2ML IJ SOLN: 4.0000 mg | Freq: Four times a day (QID) | INTRAMUSCULAR | Status: DC | PRN

## 2020-03-17 MED ORDER — NITROGLYCERIN 0.4 MG SL SUBL: 0.4000 mg | SUBLINGUAL_TABLET | SUBLINGUAL | Status: DC | PRN

## 2020-03-17 MED ORDER — LISINOPRIL 20 MG PO TABS
20.0000 mg | ORAL_TABLET | Freq: Every day | ORAL | Status: DC
  Administered 2020-03-18: 20 mg via ORAL
  Filled 2020-03-17: qty 1

## 2020-03-17 MED ORDER — LORATADINE 10 MG PO TABS
10.0000 mg | ORAL_TABLET | Freq: Every day | ORAL | Status: DC
  Administered 2020-03-18: 10 mg via ORAL
  Filled 2020-03-17: qty 1

## 2020-03-17 MED ORDER — CALCIUM CARBONATE 1250 (500 CA) MG PO TABS
1250.0000 mg | ORAL_TABLET | Freq: Every day | ORAL | Status: DC
  Administered 2020-03-18: 1250 mg via ORAL
  Filled 2020-03-17: qty 1

## 2020-03-17 MED ORDER — SODIUM CHLORIDE 0.9% FLUSH: 3.0000 mL | Freq: Once | INTRAVENOUS | Status: DC

## 2020-03-17 MED ORDER — CLOPIDOGREL BISULFATE 75 MG PO TABS
75.0000 mg | ORAL_TABLET | Freq: Every day | ORAL | Status: DC
  Administered 2020-03-18: 75 mg via ORAL
  Filled 2020-03-17: qty 1

## 2020-03-17 MED ORDER — ACETAMINOPHEN 325 MG PO TABS: 650.0000 mg | ORAL_TABLET | ORAL | Status: DC | PRN

## 2020-03-17 MED ORDER — METOPROLOL TARTRATE 25 MG PO TABS
37.5000 mg | ORAL_TABLET | Freq: Two times a day (BID) | ORAL | Status: DC
  Administered 2020-03-17 – 2020-03-18 (×2): 37.5 mg via ORAL
  Filled 2020-03-17 (×2): qty 2

## 2020-03-17 MED ORDER — PANTOPRAZOLE SODIUM 20 MG PO TBEC
20.0000 mg | DELAYED_RELEASE_TABLET | Freq: Every day | ORAL | Status: DC
  Administered 2020-03-18: 20 mg via ORAL
  Filled 2020-03-17: qty 1

## 2020-03-17 MED ORDER — CLOPIDOGREL BISULFATE 75 MG PO TABS: 75.0000 mg | ORAL_TABLET | Freq: Every day | ORAL | 11 refills | Status: DC

## 2020-03-17 MED ORDER — ISOSORBIDE MONONITRATE ER 30 MG PO TB24
15.0000 mg | ORAL_TABLET | Freq: Every day | ORAL | Status: DC
  Administered 2020-03-18: 10:00:00 15 mg via ORAL
  Filled 2020-03-17: qty 1

## 2020-03-17 MED ORDER — TAMSULOSIN HCL 0.4 MG PO CAPS
0.4000 mg | ORAL_CAPSULE | Freq: Every day | ORAL | Status: DC
  Administered 2020-03-17: 22:00:00 0.4 mg via ORAL
  Filled 2020-03-17: qty 1

## 2020-03-17 MED ORDER — ASPIRIN EC 81 MG PO TBEC
81.0000 mg | DELAYED_RELEASE_TABLET | Freq: Every day | ORAL | Status: DC
  Administered 2020-03-18: 81 mg via ORAL
  Filled 2020-03-17: qty 1

## 2020-03-17 MED ORDER — ASPIRIN EC 81 MG PO TBEC: 81.0000 mg | DELAYED_RELEASE_TABLET | Freq: Every day | ORAL | Status: DC

## 2020-03-17 NOTE — Progress Notes (Signed)
Progress Note  Patient Name: [name] Date of Encounter: 03/17/2020  Primary Cardiologist: Dr. Peter Martinique  Subjective   Postop day #1 lateral STEMI treated with PCI drug-eluting stenting of the OM 2 and distal circumflex by Dr. Martinique.  Patient is pain-free.  Troponins rose mildly to 170.  He somewhat cognitively impaired since his bypass surgery 3 months ago.  He he stable for discharge  Inpatient Medications    Scheduled Meds: . aspirin  81 mg Oral Daily  . B-complex with vitamin C  3 tablet Oral Q breakfast  . Chlorhexidine Gluconate Cloth  6 each Topical Daily  . clopidogrel  75 mg Oral Q breakfast  . enoxaparin (LOVENOX) injection  40 mg Subcutaneous Q24H  . lisinopril  20 mg Oral Daily  . loratadine  10 mg Oral Daily  . metoprolol tartrate  25 mg Oral BID  . pantoprazole  40 mg Oral Daily  . sodium chloride flush  3 mL Intravenous Once  . sodium chloride flush  3 mL Intravenous Q12H  . tamsulosin  0.4 mg Oral QPC supper   Continuous Infusions: . sodium chloride 10 mL/hr at 03/16/20 0928  . sodium chloride     PRN Meds: sodium chloride, acetaminophen, fluticasone, hydrocortisone, ondansetron (ZOFRAN) IV, sodium chloride flush   Vital Signs    Vitals:   03/17/20 0500 03/17/20 0600 03/17/20 0700 03/17/20 0704  BP:    (!) 149/76  Pulse: 76 74 84 74  Resp: (!) 21 13 (!) 31 20  Temp:      TempSrc:      SpO2: 96% 97% 98% 99%  Weight:      Height:        Intake/Output Summary (Last 24 hours) at 03/17/2020 0842 Last data filed at 03/17/2020 0500 Gross per 24 hour  Intake 480 ml  Output 2265 ml  Net -1785 ml   Last 3 Weights 03/16/2020 01/22/2020 01/15/2020  Weight (lbs) 176 lb 5.9 oz 152 lb 152 lb 9.6 oz  Weight (kg) 80 kg 68.947 kg 69.219 kg      Telemetry    Sinus rhythm- Personally Reviewed  ECG    Normal sinus rhythm at 76 with LVH and left axis deviation- Personally Reviewed  Physical Exam   GEN: No acute distress.   Neck: No  JVD Cardiac: RRR, no murmurs, rubs, or gallops.  Respiratory: Clear to auscultation bilaterally. GI: Soft, nontender, non-distended  MS: No edema; No deformity. Neuro:  Nonfocal  Psych: Normal affect  Extremity-left radial puncture site stable  Labs    High Sensitivity Troponin:   Recent Labs  Lab 03/16/20 0646 03/16/20 1007 03/16/20 1230  TROPONINIHS 11 99* 175*      Chemistry Recent Labs  Lab 03/16/20 0646 03/16/20 0800 03/17/20 0103  NA 140 138 138  K 3.8 3.8 4.3  CL 103 101 105  CO2 27  --  23  GLUCOSE 172* 168* 106*  BUN 19 19 16   CREATININE 1.20 1.10 1.14  CALCIUM 8.9  --  8.6*  GFRNONAA 59*  --  >60  GFRAA >60  --  >60  ANIONGAP 10  --  10     Hematology Recent Labs  Lab 03/16/20 0646 03/16/20 0800 03/17/20 0103  WBC 5.3  --  6.3  RBC 4.14*  --  4.01*  HGB 11.3* 10.9* 11.1*  HCT 36.5* 32.0* 35.0*  MCV 88.2  --  87.3  MCH 27.3  --  27.7  MCHC 31.0  --  31.7  RDW 13.3  --  13.2  PLT 172  --  185    BNPNo results for input(s): BNP, PROBNP in the last 168 hours.   DDimer No results for input(s): DDIMER in the last 168 hours.   Radiology    DG Chest 2 View  Result Date: 03/16/2020 CLINICAL DATA:  Chest pain EXAM: CHEST - 2 VIEW COMPARISON:  01/22/2020 FINDINGS: Normal heart size. CABG. Stable aortic tortuosity. There is no edema, consolidation, effusion, or pneumothorax. Calcified granuloma in the left mid lung. No acute osseous finding IMPRESSION: No evidence of acute disease. Electronically Signed   By: Monte Fantasia M.D.   On: 03/16/2020 07:13   CARDIAC CATHETERIZATION  Result Date: 03/16/2020  Mid LAD-2 lesion is 100% stenosed.  Mid LAD-1 lesion is 80% stenosed.  Prox LAD lesion is 25% stenosed.  Dist Cx lesion is 90% stenosed.  A drug-eluting stent was successfully placed using a [place] [id number].  Post intervention, there is a 0% residual stenosis.  Prox RCA lesion is 65% stenosed.  RPAV lesion is 95% stenosed.  Mid RCA  lesion is 30% stenosed.  Dist RCA lesion is 30% stenosed.  2nd Mrg-1 lesion is 99% stenosed.  A drug-eluting stent was successfully placed using a STENT RESOLUTE ONYX 2.5X18.  Post intervention, there is a 0% residual stenosis.  2nd Mrg-2 lesion is 90% stenosed.  A drug-eluting stent was successfully placed using a [place] [id number].  Post intervention, there is a 0% residual stenosis.  LIMA graft was visualized by angiography and is normal in caliber.  The graft exhibits no disease.  SVG graft was visualized by angiography and is normal in caliber.  The graft exhibits no disease.  SVG graft was visualized by angiography.  SVG graft was visualized by angiography.  Origin lesion is 100% stenosed.  Origin to Prox Graft lesion is 100% stenosed.  LV end diastolic pressure is normal.  1. Severe 3 vessel obstructive CAD 2. Patent LIMA to the LAD 3. Patent SVG to the diagonal 4. Occluded SVG to OM2. The graft appeared to insert into a segment of severe disease 5. Occluded SVG to RCA possibly due to competitive filling from the native vessel 6. Successful PCI of OM2 with DES x 2 in proximal and distal vessel 7. Successful PCI of the distal LCx with DES x 1. 8. Normal LVEDP Plan: DAPT for at least one year. Will DC Eliquis since he has had no Afib since DC from surgery. DC amiodarone. Intolerant to multiple cholesterol lowering drugs in the past. Will see lipid clinic as outpatient to get started on PCSK 9 inhibitor. I doubt the lesion in the proximal RCA is flow limiting so we will treat this medically for now. If he has recurrent angina we could consider PCI of this vessel. Patient may be a candidate for DC tomorrow if clinically stable.    Cardiac Studies   Cardiac risk catheterization/PCI and stent 03/16/2020  Conclusion    Mid LAD-2 lesion is 100% stenosed.  Mid LAD-1 lesion is 80% stenosed.  Prox LAD lesion is 25% stenosed.  Dist Cx lesion is 90% stenosed.  A drug-eluting  stent was successfully placed using a [place] [id number].  Post intervention, there is a 0% residual stenosis.  Prox RCA lesion is 65% stenosed.  RPAV lesion is 95% stenosed.  Mid RCA lesion is 30% stenosed.  Dist RCA lesion is 30% stenosed.  2nd Mrg-1 lesion is 99% stenosed.  A drug-eluting  stent was successfully placed using a STENT RESOLUTE ONYX 2.5X18.  Post intervention, there is a 0% residual stenosis.  2nd Mrg-2 lesion is 90% stenosed.  A drug-eluting stent was successfully placed using a [place] [id number].  Post intervention, there is a 0% residual stenosis.  LIMA graft was visualized by angiography and is normal in caliber.  The graft exhibits no disease.  SVG graft was visualized by angiography and is normal in caliber.  The graft exhibits no disease.  SVG graft was visualized by angiography.  SVG graft was visualized by angiography.  Origin lesion is 100% stenosed.  Origin to Prox Graft lesion is 100% stenosed.  LV end diastolic pressure is normal.   1. Severe 3 vessel obstructive CAD 2. Patent LIMA to the LAD 3. Patent SVG to the diagonal 4. Occluded SVG to OM2. The graft appeared to insert into a segment of severe disease 5. Occluded SVG to RCA possibly due to competitive filling from the native vessel 6. Successful PCI of OM2 with DES x 2 in proximal and distal vessel 7. Successful PCI of the distal LCx with DES x 1.  8. Normal LVEDP  Plan: DAPT for at least one year. Will DC Eliquis since he has had no Afib since DC from surgery. DC amiodarone. Intolerant to multiple cholesterol lowering drugs in the past. Will see lipid clinic as outpatient to get started on PCSK 9 inhibitor. I doubt the lesion in the proximal RCA is flow limiting so we will treat this medically for now. If he has recurrent angina we could consider PCI of this vessel. Patient may be a candidate for DC tomorrow if clinically stable.     Coronary  Diagrams  Diagnostic Dominance: Right  Intervention     Patient Profile     74 y.o. male 74 year old married Caucasian male now 4 months status post CABG x4 LIMA to his LAD, vein to diagonal branch, second marginal branch and PDA.  He had preserved LV function.  He also has hyperlipidemia.  He developed chest pain yesterday and was brought to the hospital where his EKG showed lateral ST segment elevation in leads I and L.  He went emergently to the Cath Lab and was intervened on by Dr. Martinique of his circumflex circumflex obtuse marginal branch #2 and distal posterior lateral branch with demonstration of occluded vein graft to the distal RCA and second obtuse marginal branch.  Assessment & Plan    1: Coronary artery disease-history of CAD status post remote stenting and cath December 2020 that showed three-vessel disease.  He underwent CABG x4 in January of this year with a LIMA to his LAD, vein to the RCA, OM1 and diagonal branch.  He is an outpatient with Dr. Doug Sou.  He developed chest pain yesterday morning was brought to the emergency room where his EKG showed lateral ST segment elevation.  He was brought urgently to the Cath Lab where he underwent diagnostic coronary angiography via the left radial approach revealing an occluded vein graft to the distal RCA and OM 2 with a patent vein to the diagonal branch and internal mammary artery.  He underwent PCI drug-eluting stenting of the proximal and distal second obtuse marginal branch as well as the PLA branch.  He did have a 70% proximal RCA on first band and a diffusely diseased PLA branch which fills by left-to-right collaterals.  His troponins rose to 179.  He is pain-free this morning.  2: Hyperlipidemia-history of hyperlipidemia with lipid profile  performed yesterday revealing total cholesterol of 205 and LDL 137.  He apparently is intolerant to statin therapy.  We will arrange lipid clinic evaluation for initiation of PCSK9  3:  Essential hypertension-blood pressure is borderline today at 149/76.  He is on lisinopril 20 mg a day and a beta-blocker at home.  4: Paroxysmal atrial fibrillation-history of PAF postop on apixaban.  He also is on amiodarone.  He is currently in sinus rhythm.  I agree with Dr. Martinique that at this point we can stop his oral anticoagulant and his amiodarone.  He will remain on aspirin and Plavix.  Mr. Bottorff is postop day 1 circumflex infarct probably related to an occlusion of his SVG with excellent result from PCI drug-eluting stenting of the proximal and distal OM 2 and distal posterior lateral branch.  He stable for discharge home this morning.  We will arrange lipid clinic follow-up for initiation of PCSK9 and TOC 7 after which he will see Dr. Martinique back in the office.  For questions or updates, please contact [place] Please consult [email or web address] for contact info under        Signed, Quay Burow, MD  03/17/2020, 8:42 AM

## 2020-03-17 NOTE — ED Triage Notes (Signed)
Pt arrives to ER after being discharged from hospital today at 12pm after having 3 cardiac stents placed yesterday after having a MI. Pt also had bypass surgery in January. Pt began to have return of chest pain at 2pm that went into neck. Pt states the pain has resolved right now but states its been intermitted.

## 2020-03-17 NOTE — Telephone Encounter (Signed)
New message  Pt c/o of Chest Pain: STAT if CP now or developed within 24 hours  1. Are you having CP right now? No   2. Are you experiencing any other symptoms (ex. SOB, nausea, vomiting, sweating)?no   3. How long have you been experiencing CP? Patient's wife states that the chest pain started 12 minutes ago  4. Is your CP continuous or coming and going? Coming and going  5. Have you taken Nitroglycerin? No  ?

## 2020-03-17 NOTE — Discharge Summary (Addendum)
Discharge Summary    [name] ID: [name] MRN: [id number]; DOB: 1946/04/06  Admit date: 03/16/2020 Discharge date: 03/17/2020  Primary Care Provider: Marton Redwood, MD  Primary Cardiologist: Peter Martinique, MD   Discharge Diagnoses    Principal Problem:   STEMI involving left circumflex coronary artery [facility]) Active Problems:   Essential hypertension   GERD   Hyperlipidemia   Diabetes ([place])   Atrial fibrillation ([place])   S/P CABG x 4  Diagnostic Studies/Procedures    Coronary/Graft Acute MI Revascularization  CORONARY STENT INTERVENTION  LEFT HEART CATH AND CORS/GRAFTS ANGIOGRAPHY  Conclusion    Mid LAD-2 lesion is 100% stenosed.  Mid LAD-1 lesion is 80% stenosed.  Prox LAD lesion is 25% stenosed.  Dist Cx lesion is 90% stenosed.  A drug-eluting stent was successfully placed using a [place] [id number].  Post intervention, there is a 0% residual stenosis.  Prox RCA lesion is 65% stenosed.  RPAV lesion is 95% stenosed.  Mid RCA lesion is 30% stenosed.  Dist RCA lesion is 30% stenosed.  2nd Mrg-1 lesion is 99% stenosed.  A drug-eluting stent was successfully placed using a STENT RESOLUTE ONYX 2.5X18.  Post intervention, there is a 0% residual stenosis.  2nd Mrg-2 lesion is 90% stenosed.  A drug-eluting stent was successfully placed using a [place] [id number].  Post intervention, there is a 0% residual stenosis.  LIMA graft was visualized by angiography and is normal in caliber.  The graft exhibits no disease.  SVG graft was visualized by angiography and is normal in caliber.  The graft exhibits no disease.  SVG graft was visualized by angiography.  SVG graft was visualized by angiography.  Origin lesion is 100% stenosed.  Origin to Prox Graft lesion is 100% stenosed.  LV end diastolic pressure is normal.   1. Severe 3 vessel obstructive CAD 2. Patent LIMA to the LAD 3. Patent SVG to the diagonal 4. Occluded SVG to  OM2. The graft appeared to insert into a segment of severe disease 5. Occluded SVG to RCA possibly due to competitive filling from the native vessel 6. Successful PCI of OM2 with DES x 2 in proximal and distal vessel 7. Successful PCI of the distal LCx with DES x 1.  8. Normal LVEDP  Plan: DAPT for at least one year. Will DC Eliquis since he has had no Afib since DC from surgery. DC amiodarone. Intolerant to multiple cholesterol lowering drugs in the past. Will see lipid clinic as outpatient to get started on PCSK 9 inhibitor. I doubt the lesion in the proximal RCA is flow limiting so we will treat this medically for now. If he has recurrent angina we could consider PCI of this vessel. [name] may be a candidate for DC tomorrow if clinically stable.    Diagnostic Dominance: Right  Intervention      History of Present Illness     [name] is a 74 y.o. male with history of CAD s/p CABG January 2021,post op atrial fibrillation on anticoagulation, hypertension, type 2 diabetes and hyperlipidemia presented with chest pain and found to have STEMI.  Cath December 2020 revealed three-vessel CAD.  He was seen by cardiothoracic surgeon and underwent CABG x4 with LIMA to LAD, SVG to RCA, SVG to OM1 and SVG to diagonal. Endoscopic harvest of left leg greater saphenous vein by Dr. Nils Pyle on 12/27/2019. He has post operative atrial fib and was placed on amiodarone and Eliquis.   Last seen by APP  February 2021.  [name] woke up 03/16/20 morning at 5:00 with left-sided upper chest pain radiating to his upper back and left axilla.  Associated with shortness of breath.  He did have some chest discomfort yesterday.  EKG showed lateral ST elevation with reciprocal depression in inferior leads.  Code STEMI called and he was brought to Cath Lab emergently.  He received aspirin 324 mg and heparin bolus.    Hospital Course     Consultants: None  1. STEMI: Cath showed Occluded SVG to OM2 and RCA.  Patent LIMA to LAD and SVG to diagonal. OM 2 has two separate high grade stenosis s/p successful PCI of OM2 with DES x 2 in proximal and distal vessel. Also underwent successful PCI of the distal LCx (90% stenosis) with DES x 1. He did have a 70% proximal RCA on first band and a diffusely diseased PLA branch which fills by left-to-right collaterals>> consider PCI if refractory angina.  His troponins rose to 179. No recurrent chest pain. DAPT with ASA and Plavix. Continue BB and ACE.  2. HLD - 03/16/2020: Cholesterol 205; HDL 47; LDL Cholesterol 137; Triglycerides 107; VLDL 21  - Multiple statin intolerance - PCSK9 inhibitor as outpatient  3. PAF - Hx of post on afib after CABG. No recurrence. Dr. Martinique recommended stopping his oral anticoagulant and amiodarone.   4. DM - Advised to hold metformin 48 hours post PCI.   Did the [name] have an acute coronary syndrome (MI, NSTEMI, STEMI, etc) this admission?:  Yes                               AHA/ACC Clinical Performance & Quality Measures: 1. Aspirin prescribed? - Yes 2. ADP Receptor Inhibitor (Plavix/Clopidogrel, Brilinta/Ticagrelor or Effient/Prasugrel) prescribed (includes medically managed patients)? - Yes 3. Beta Blocker prescribed? - Yes 4. High Intensity Statin (Lipitor 40-80mg  or Crestor 20-40mg ) prescribed? - No - statin intolerance; plan to evalute in lipic clinic for PCSK9 inhibitor 5. EF assessed during THIS hospitalization? - No - recently had echo 6. For EF <40%, was ACEI/ARB prescribed? - Yes 7. For EF <40%, Aldosterone Antagonist (Spironolactone or Eplerenone) prescribed? - Not Applicable (EF >/= [id number]) 8. Cardiac Rehab Phase II ordered (Included Medically managed Patients)? - Yes   _____________  Discharge Vitals Blood pressure (!) 149/76, pulse 74, temperature 98.2 F (36.8 C), temperature source Oral, resp. rate 20, height 5\' 10"  (1.778 m), weight 80 kg, SpO2 99 %.  Filed Weights   03/16/20 0637  Weight: 80 kg     Labs & Radiologic Studies    CBC Recent Labs    03/16/20 0646 03/16/20 0646 03/16/20 0800 03/17/20 0103  WBC 5.3  --   --  6.3  HGB 11.3*   < > 10.9* 11.1*  HCT 36.5*   < > 32.0* 35.0*  MCV 88.2  --   --  87.3  PLT 172  --   --  185   < > = values in this interval not displayed.   Basic Metabolic Panel Recent Labs    03/16/20 0646 03/16/20 0646 03/16/20 0800 03/17/20 0103  NA 140   < > 138 138  K 3.8   < > 3.8 4.3  CL 103   < > 101 105  CO2 27  --   --  23  GLUCOSE 172*   < > 168* 106*  BUN 19   < > 19 16  CREATININE 1.20   < > 1.10 1.14  CALCIUM 8.9  --   --  8.6*   < > = values in this interval not displayed.   High Sensitivity Troponin:   Recent Labs  Lab 03/16/20 0646 03/16/20 1007 03/16/20 1230  TROPONINIHS 11 99* 175*  Hemoglobin A1C Recent Labs    03/16/20 0646  HGBA1C 7.2*   Fasting Lipid Panel Recent Labs    03/16/20 0646  CHOL 205*  HDL 47  LDLCALC 137*  TRIG 107  CHOLHDL 4.4   _____________  DG Chest 2 View  Result Date: 03/16/2020 CLINICAL DATA:  Chest pain EXAM: CHEST - 2 VIEW COMPARISON:  01/22/2020 FINDINGS: Normal heart size. CABG. Stable aortic tortuosity. There is no edema, consolidation, effusion, or pneumothorax. Calcified granuloma in the left mid lung. No acute osseous finding IMPRESSION: No evidence of acute disease. Electronically Signed   By: Monte Fantasia M.D.   On: 03/16/2020 07:13   CARDIAC CATHETERIZATION  Result Date: 03/16/2020  Mid LAD-2 lesion is 100% stenosed.  Mid LAD-1 lesion is 80% stenosed.  Prox LAD lesion is 25% stenosed.  Dist Cx lesion is 90% stenosed.  A drug-eluting stent was successfully placed using a [place] [id number].  Post intervention, there is a 0% residual stenosis.  Prox RCA lesion is 65% stenosed.  RPAV lesion is 95% stenosed.  Mid RCA lesion is 30% stenosed.  Dist RCA lesion is 30% stenosed.  2nd Mrg-1 lesion is 99% stenosed.  A drug-eluting stent was successfully placed  using a STENT RESOLUTE ONYX 2.5X18.  Post intervention, there is a 0% residual stenosis.  2nd Mrg-2 lesion is 90% stenosed.  A drug-eluting stent was successfully placed using a [place] [id number].  Post intervention, there is a 0% residual stenosis.  LIMA graft was visualized by angiography and is normal in caliber.  The graft exhibits no disease.  SVG graft was visualized by angiography and is normal in caliber.  The graft exhibits no disease.  SVG graft was visualized by angiography.  SVG graft was visualized by angiography.  Origin lesion is 100% stenosed.  Origin to Prox Graft lesion is 100% stenosed.  LV end diastolic pressure is normal.  1. Severe 3 vessel obstructive CAD 2. Patent LIMA to the LAD 3. Patent SVG to the diagonal 4. Occluded SVG to OM2. The graft appeared to insert into a segment of severe disease 5. Occluded SVG to RCA possibly due to competitive filling from the native vessel 6. Successful PCI of OM2 with DES x 2 in proximal and distal vessel 7. Successful PCI of the distal LCx with DES x 1. 8. Normal LVEDP Plan: DAPT for at least one year. Will DC Eliquis since he has had no Afib since DC from surgery. DC amiodarone. Intolerant to multiple cholesterol lowering drugs in the past. Will see lipid clinic as outpatient to get started on PCSK 9 inhibitor. I doubt the lesion in the proximal RCA is flow limiting so we will treat this medically for now. If he has recurrent angina we could consider PCI of this vessel. [name] may be a candidate for DC tomorrow if clinically stable.   Disposition   Pt is being discharged home today in good condition.  Follow-up Plans & Appointments    Follow-up Information    Abigail Butts., PA-C. Go on 03/30/2020.   Specialty: Physician Assistant Why: @11 :15am for hospital follow up  Contact information: [address] [place] [address] [phone]  Discharge Instructions    Diet - low sodium  heart healthy   Complete by: As directed    Discharge instructions   Complete by: As directed    Hold metformin today. Resume on 03/18/20. No driving for 2 weeks. No lifting over 10 lbs for 4 weeks. No sexual activity for 4 weeks. You may not return to work until cleared by your cardiologist.  Keep procedure site clean & dry. If you notice increased pain, swelling, bleeding or pus, call/return!  You may shower, but no soaking baths/hot tubs/pools for 1 week.   Increase activity slowly   Complete by: As directed       Discharge Medications   Allergies as of 03/17/2020      Reactions   Statins Anaphylaxis   Myalgias   Bismuth Subsalicylate Nausea And Vomiting   Codeine Nausea And Vomiting   Kiwi Extract    Makes face red, break out into bumps on body.    Morphine And Related Nausea And Vomiting   Altace [ramipril] Rash      Medication List    STOP taking these medications   amiodarone 200 MG tablet Commonly known as: PACERONE   apixaban 5 MG Tabs tablet Commonly known as: Eliquis     TAKE these medications   acetaminophen 650 MG CR tablet Commonly known as: TYLENOL Take 1,300 mg by mouth every 8 (eight) hours as needed for pain.   aspirin EC 81 MG tablet Take 1 tablet (81 mg total) by mouth daily.   B-complex with vitamin C tablet Take 1 tablet by mouth daily with breakfast.   Calcium-Magnesium-Zinc-D3 Tabs Take 1 tablet by mouth daily with lunch.   cetirizine 10 MG tablet Commonly known as: ZYRTEC Take 10 mg by mouth daily.   clopidogrel 75 MG tablet Commonly known as: PLAVIX Take 1 tablet (75 mg total) by mouth daily with breakfast.   fluticasone 50 MCG/ACT nasal spray Commonly known as: FLONASE Place 2 sprays into both nostrils daily as needed for allergies.   hydrocortisone 2.5 % rectal cream Commonly known as: ANUSOL-HC Place 1 application rectally 2 (two) times daily.   lansoprazole 15 MG capsule Commonly known as: PREVACID Take 15 mg by mouth  daily.   lisinopril 20 MG tablet Commonly known as: ZESTRIL Take 1 tablet (20 mg total) by mouth daily.   metFORMIN 500 MG tablet Commonly known as: GLUCOPHAGE Take 1 tablet (500 mg total) by mouth 2 (two) times daily with a meal.   metoprolol tartrate 25 MG tablet Commonly known as: LOPRESSOR Take 1 tablet (25 mg total) by mouth 2 (two) times daily.   nitroGLYCERIN 0.4 MG SL tablet Commonly known as: Nitrostat Place 1 tablet (0.4 mg total) under the tongue every 5 (five) minutes as needed.   olopatadine 0.1 % ophthalmic solution Commonly known as: PATANOL Place 1 drop into both eyes 2 (two) times daily as needed for allergies.   OneTouch Delica Lancets [id number] Misc 1 each by Other route daily.   OneTouch Verio test strip Generic drug: glucose blood 1 each by Other route daily.   tamsulosin 0.4 MG Caps capsule Commonly known as: FLOMAX Take 0.4 mg by mouth daily after supper.          Outstanding Labs/Studies   None  Duration of Discharge Encounter   Greater than 30 minutes including physician time.  Jarrett Soho, PA 03/17/2020, 9:37 AM  Agree with note by Robbie Lis PA-C  [name] well-known to Dr. Martinique status post  bypass grafting in January.  He was admitted with a non-STEMI.  Troponins rose to proximal 100.  Cath revealed occluded vein to the distal right and to the circumflex obtuse marginal branch.  He underwent PCI drug-eluting stenting to the proximal and distal second obtuse marginal branch as well as to the PLA.  He had normal EF in the past.  He was pain-free and hemodynamically stable.  Exam is benign.  He can be discharged home with early outpatient follow-up.  He was on Eliquis and amiodarone for postop A. fib which was discontinued.  Lorretta Harp, M.D., [place], [facility], [place] [address] [place], [place]  [address]  [phone] 03/17/2020 2:17 PM

## 2020-03-17 NOTE — Telephone Encounter (Signed)
Spoke with wife who report pt was seen yesterday in ER for STEMI. Pt underwent Cath procedure with stent placement. Wife calling reporting since discharge, pt has had 3 episode of chest pain that last a few second with no precipitating factors. Nurse advised pt to report back to ER for further evaluations. Wife verbalized understanding.

## 2020-03-17 NOTE — H&P (Signed)
Cardiology Consultation:   Patient ID: [name] MRN: [id number]; DOB: 1946/01/05  Admit date: 03/17/2020 Date of Consult: 03/17/2020  Primary Care Provider: Marton Redwood, MD Primary Cardiologist: Peter Martinique, MD  Primary Electrophysiologist:  None    Patient Profile:   [name] is a 74 y.o. male with a hx of CAD who is being seen today for the evaluation of chest pain   History of Present Illness:   [name] 74 yo male history of CAD with prior CABG in Jan 2021 LIMA to LAD, SVG to RCA, SVG to OM1 and SVG to diagonal, postop afib on anticoag, HTN,DM2, HL admitted just a few days ago with STEMI with lateral ST elevations. Mild trop up to 175 peak. Discharged just this afternoon. Presents again this evening with chest pain.   Mild sharp pain left upper chest that lasted just a few seconds. No other associated symptoms. Repeat episode sharper and more intense about 20 minutes later laster a few minutes. Pain similar location and character to his pain from yesterday when [name] presented with a STEMI. [name] is a difficult historian, reports chronic memory issues particularly after his CABG surgery. His wife corroborates the pain symptoms at home, feels they were similar but not as intense as symptoms that [name] had when [name] presented with STEMI   K 4.3 Cr 1.14 BUN 16 Cr 1.4 EKG SR, inferior Qwaves  Cath: severe 3 vessel obstructive disease. Patent LIMA-LAD, patent SVG-diag. Occlued SVG-OM2, occluded SVG-RCA. Had DES x 2 to OM2 and DES to LCX. The prox RCA lesion was treated medically, but if recurrent angina consider PCI.  CXR no acute process Jan 2021 echo LVEF 55-60%, no WMAs, normal RV function     Past Medical History:  Diagnosis Date  . Arthritis   . Arthritis   . Atrial fibrillation ([place])   . BPH (benign prostatic hypertrophy)   . CAD (coronary artery disease)    stents placed  . Diabetes mellitus   . Diverticulosis   . Dyspnea    when exerting self  .  Dysrhythmia    "extra beats"  . Esophageal stricture   . GERD (gastroesophageal reflux disease)   . Headache(784.0)   . Hemorrhoids   . Hernia, inguinal, right   . Hiatal hernia   . Hyperlipidemia   . Hypertension   . Pneumonia   . Staph infection    Left ankle  . 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 Urinary hesitancy     Past Surgical History:  Procedure Laterality Date  . ANKLE ARTHROPLASTY  2006  . CARDIOVASCULAR STRESS TEST  01/22/2009   EF 51%, NO ISCHEMIA  . CARDIOVASCULAR STRESS TEST    . COLONOSCOPY    . CORONARY ANGIOPLASTY WITH STENT PLACEMENT  03/2005  . CORONARY ARTERY BYPASS GRAFT N/A 12/20/2019   Procedure: CORONARY ARTERY BYPASS GRAFTING (CABG) times four, using left internal mammary artery and left greater saphenous vein;  Surgeon: Ivin Poot, MD;  Location: [facility];  Service: Open Heart Surgery;  Laterality: N/A;  . CORONARY STENT INTERVENTION N/A 03/16/2020   Procedure: CORONARY STENT INTERVENTION;  Surgeon: Martinique, Peter M, MD;  Location: [facility];  Service: Cardiovascular;  Laterality: N/A;  . CORONARY STENT PLACEMENT  03/10/2005   SUCCESSFUL STENTING OF THE MID AND CRUX OF THE RCA  . CORONARY/GRAFT ACUTE MI REVASCULARIZATION N/A 03/16/2020   Procedure: Coronary/Graft Acute MI Revascularization;  Surgeon: Martinique, Peter M, MD;  Location: [facility];  Service: Cardiovascular;  Laterality: N/A;  . ESOPHAGEAL DILATION     multiple procedures  . HERNIA REPAIR  [id number]   umbilical hernia  . INGUINAL HERNIA REPAIR  11/28/2011   Procedure: HERNIA REPAIR INGUINAL ADULT;  Surgeon: Imogene Burn. Georgette Dover, MD;  Location: [facility];  Service: General;  Laterality: Right;  right inguinal hernia repair with mesh  . KNEE SURGERY     Right  . LEFT HEART CATH AND CORONARY ANGIOGRAPHY N/A 12/03/2019   Procedure: LEFT HEART CATH AND CORONARY ANGIOGRAPHY;  Surgeon: Jettie Booze, MD;  Location: [facility];  Service: Cardiovascular;  Laterality: N/A;   . LEFT HEART CATH AND CORS/GRAFTS ANGIOGRAPHY N/A 03/16/2020   Procedure: LEFT HEART CATH AND CORS/GRAFTS ANGIOGRAPHY;  Surgeon: Martinique, Peter M, MD;  Location: [facility];  Service: Cardiovascular;  Laterality: N/A;  . POLYPECTOMY    . TEE WITHOUT CARDIOVERSION N/A 12/20/2019   Procedure: TRANSESOPHAGEAL ECHOCARDIOGRAM (TEE);  Surgeon: Prescott Gum, Collier Salina, MD;  Location: [facility];  Service: Open Heart Surgery;  Laterality: N/A;  . TONSILLECTOMY  1958  . UMBILICAL HERNIA REPAIR  11/2006  . US ECHOCARDIOGRAPHY       Inpatient Medications: Scheduled Meds: . sodium chloride flush  3 mL Intravenous Once   Continuous Infusions:  PRN Meds:   Allergies:    Allergies  Allergen Reactions  . Statins Anaphylaxis    Myalgias   . Bismuth Subsalicylate Nausea And Vomiting  . Codeine Nausea And Vomiting  . Kiwi Extract     Makes face red, break out into bumps on body.   . Morphine And Related Nausea And Vomiting  . Altace [Ramipril] Rash    Social History:   Social History   Socioeconomic History  . Marital status: Married    Spouse name: Not on file  . Number of children: 3  . Years of education: Not on file  . Highest education level: Not on file  Occupational History  . Occupation: Retired  Tobacco Use  . Smoking status: Former Smoker    Packs/day: 1.00    Years: 22.00    Pack years: 22.00    Types: Cigarettes, Pipe    Quit date: 12/17/2014    Years since quitting: 5.2  . Smokeless tobacco: Never Used  Substance and Sexual Activity  . Alcohol use: No    Alcohol/week: 0.0 standard drinks  . Drug use: No  . Sexual activity: Not on file  Other Topics Concern  . Not on file  Social History Narrative  . Not on file   Social Determinants of Health   Financial Resource Strain:   . Difficulty of Paying Living Expenses:   Food Insecurity:   . Worried About Charity fundraiser in the Last Year:   . Arboriculturist in the Last Year:   Transportation Needs:   . Lexicographer (Medical):   Marland Kitchen Lack of Transportation (Non-Medical):   Physical Activity:   . Days of Exercise per Week:   . Minutes of Exercise per Session:   Stress:   . Feeling of Stress :   Social Connections:   . Frequency of Communication with Friends and Family:   . Frequency of Social Gatherings with Friends and Family:   . Attends Religious Services:   . Active Member of Clubs or Organizations:   . Attends Archivist Meetings:   Marland Kitchen Marital Status:   Intimate Partner Violence:   . Fear of Current or  Ex-Partner:   . Emotionally Abused:   Marland Kitchen Physically Abused:   . Sexually Abused:     Family History:    Family History  Problem Relation Age of Onset  . Heart disease Mother   . Heart disease Sister   . Heart disease Brother   . Heart disease Maternal Grandfather   . Ovarian cancer Paternal Grandmother   . Cancer Paternal Grandmother        cervical  . Colon cancer Neg Hx   . Esophageal cancer Neg Hx   . Stomach cancer Neg Hx      ROS:  Please see the history of present illness.  All other ROS reviewed and negative.     Physical Exam/Data:   Vitals:   03/17/20 1639  BP: 120/70  Pulse: 63  Resp: 16  Temp: 98.5 F (36.9 C)  TempSrc: Oral  SpO2: 99%   No intake or output data in the 24 hours ending 03/17/20 1948 Last 3 Weights 03/16/2020 01/22/2020 01/15/2020  Weight (lbs) 176 lb 5.9 oz 152 lb 152 lb 9.6 oz  Weight (kg) 80 kg 68.947 kg 69.219 kg     There is no height or weight on file to calculate BMI.  General:  Well nourished, well developed, in no acute distress HEENT: normal Lymph: no adenopathy Neck: no JVD Endocrine:  No thryomegaly Vascular: No carotid bruits; FA pulses 2+ bilaterally without bruits  Cardiac:  normal S1, S2; RRR; no murmur  Lungs:  clear to auscultation bilaterally, no wheezing, rhonchi or rales  Abd: soft, nontender, no hepatomegaly  Ext: no edema Musculoskeletal:  No deformities, BUE and BLE strength normal and  equal Skin: warm and dry  Neuro:  CNs 2-12 intact, no focal abnormalities noted Psych:  Normal affect     Laboratory Data:  High Sensitivity Troponin:   Recent Labs  Lab 03/16/20 0646 03/16/20 1007 03/16/20 1230 03/17/20 1637  TROPONINIHS 11 99* 175* 105*     Chemistry Recent Labs  Lab 03/16/20 0646 03/16/20 0646 03/16/20 0800 03/17/20 0103 03/17/20 1637  NA 140   < > 138 138 138  K 3.8   < > 3.8 4.3 3.9  CL 103   < > 101 105 102  CO2 27  --   --  23 24  GLUCOSE 172*   < > 168* 106* 286*  BUN 19   < > 19 16 16   CREATININE 1.20   < > 1.10 1.14 1.42*  CALCIUM 8.9  --   --  8.6* 8.5*  GFRNONAA 59*  --   --  >60 48*  GFRAA >60  --   --  >60 56*  ANIONGAP 10  --   --  10 12   < > = values in this interval not displayed.    No results for input(s): PROT, ALBUMIN, AST, ALT, ALKPHOS, BILITOT in the last 168 hours. Hematology Recent Labs  Lab 03/16/20 0646 03/16/20 0646 03/16/20 0800 03/17/20 0103 03/17/20 1637  WBC 5.3  --   --  6.3 5.2  RBC 4.14*  --   --  4.01* 4.11*  HGB 11.3*   < > 10.9* 11.1* 11.2*  HCT 36.5*   < > 32.0* 35.0* 35.9*  MCV 88.2  --   --  87.3 87.3  MCH 27.3  --   --  27.7 27.3  MCHC 31.0  --   --  31.7 31.2  RDW 13.3  --   --  13.2 13.3  PLT  172  --   --  185 181   < > = values in this interval not displayed.   BNPNo results for input(s): BNP, PROBNP in the last 168 hours.  DDimer No results for input(s): DDIMER in the last 168 hours.   Radiology/Studies:  DG Chest 2 View  Result Date: 03/16/2020 CLINICAL DATA:  Chest pain EXAM: CHEST - 2 VIEW COMPARISON:  01/22/2020 FINDINGS: Normal heart size. CABG. Stable aortic tortuosity. There is no edema, consolidation, effusion, or pneumothorax. Calcified granuloma in the left mid lung. No acute osseous finding IMPRESSION: No evidence of acute disease. Electronically Signed   By: Monte Fantasia M.D.   On: 03/16/2020 07:13   CARDIAC CATHETERIZATION  Addendum Date: 03/17/2020    Mid LAD-2  lesion is 100% stenosed.  Mid LAD-1 lesion is 80% stenosed.  Prox LAD lesion is 25% stenosed.  Dist Cx lesion is 90% stenosed.  A drug-eluting stent was successfully placed using a [place] [id number].  Post intervention, there is a 0% residual stenosis.  Prox RCA lesion is 65% stenosed.  RPAV lesion is 95% stenosed.  Mid RCA lesion is 30% stenosed.  Dist RCA lesion is 30% stenosed.  2nd Mrg-1 lesion is 99% stenosed.  A drug-eluting stent was successfully placed using a STENT RESOLUTE ONYX 2.5X18.  Post intervention, there is a 0% residual stenosis.  2nd Mrg-2 lesion is 90% stenosed.  A drug-eluting stent was successfully placed using a [place] [id number].  Post intervention, there is a 0% residual stenosis.  LIMA graft was visualized by angiography and is normal in caliber.  The graft exhibits no disease.  SVG graft was visualized by angiography and is normal in caliber.  The graft exhibits no disease.  SVG graft was visualized by angiography.  SVG graft was visualized by angiography.  Origin lesion is 100% stenosed.  Origin to Prox Graft lesion is 100% stenosed.  LV end diastolic pressure is normal.  1. Severe 3 vessel obstructive CAD 2. Patent LIMA to the LAD 3. Patent SVG to the diagonal 4. Occluded SVG to OM2. The graft appeared to insert into a segment of severe disease 5. Occluded SVG to RCA possibly due to competitive filling from the native vessel 6. Successful PCI of OM2 with DES x 2 in proximal and distal vessel 7. Successful PCI of the distal LCx with DES x 1. 8. Normal LVEDP Plan: DAPT for at least one year. Will DC Eliquis since [name] has had no Afib since DC from surgery. DC amiodarone. Intolerant to multiple cholesterol lowering drugs in the past. Will see lipid clinic as outpatient to get started on PCSK 9 inhibitor. I doubt the lesion in the proximal RCA is flow limiting so we will treat this medically for now. If [name] has recurrent angina we could consider PCI  of this vessel. Patient may be a candidate for DC tomorrow if clinically stable.   Result Date: 03/16/2020  Mid LAD-2 lesion is 100% stenosed.  Mid LAD-1 lesion is 80% stenosed.  Prox LAD lesion is 25% stenosed.  Dist Cx lesion is 90% stenosed.  A drug-eluting stent was successfully placed using a [place] [id number].  Post intervention, there is a 0% residual stenosis.  Prox RCA lesion is 65% stenosed.  RPAV lesion is 95% stenosed.  Mid RCA lesion is 30% stenosed.  Dist RCA lesion is 30% stenosed.  2nd Mrg-1 lesion is 99% stenosed.  A drug-eluting stent was successfully placed using a STENT RESOLUTE ONYX  2.5X18.  Post intervention, there is a 0% residual stenosis.  2nd Mrg-2 lesion is 90% stenosed.  A drug-eluting stent was successfully placed using a [place] [id number].  Post intervention, there is a 0% residual stenosis.  LIMA graft was visualized by angiography and is normal in caliber.  The graft exhibits no disease.  SVG graft was visualized by angiography and is normal in caliber.  The graft exhibits no disease.  SVG graft was visualized by angiography.  SVG graft was visualized by angiography.  Origin lesion is 100% stenosed.  Origin to Prox Graft lesion is 100% stenosed.  LV end diastolic pressure is normal.  1. Severe 3 vessel obstructive CAD 2. Patent LIMA to the LAD 3. Patent SVG to the diagonal 4. Occluded SVG to OM2. The graft appeared to insert into a segment of severe disease 5. Occluded SVG to RCA possibly due to competitive filling from the native vessel 6. Successful PCI of OM2 with DES x 2 in proximal and distal vessel 7. Successful PCI of the distal LCx with DES x 1. 8. Normal LVEDP Plan: DAPT for at least one year. Will DC Eliquis since [name] has had no Afib since DC from surgery. DC amiodarone. Intolerant to multiple cholesterol lowering drugs in the past. Will see lipid clinic as outpatient to get started on PCSK 9 inhibitor. I doubt the lesion in the  proximal RCA is flow limiting so we will treat this medically for now. If [name] has recurrent angina we could consider PCI of this vessel. Patient may be a candidate for DC tomorrow if clinically stable.       HEAR Score (for undifferentiated chest pain):       Assessment and Plan:   1. CAD/chest pain - extensive CAD history. Recent CABG in Jan 2021 LIMA to LAD, SVG to RCA, SVG to OM1 and SVG to diagonal - admitted just yesterday STEMI, lateral ST elevations. Overall mild troponin. Early graft failure as reported above, had PCI to OM and LCX. Borderline RCA thought not to be flow limiting but would consider intervention if recurrent angnia - presents with chest pains similar to yesterdays symptoms but not as severe.  - trop is 105, trending down from recent admission likely - EKG wihtout acute ischemic changes  - current medical therapy with ASA 81, plavix 75, lisinopril 20, lopressor 25mg  bid - intolerant to sevearl statins, to be considered for pcsk9 inhibitor as outpatient.   - increase lopressor to 37.5mg  bid. Start imdur 15mg  daily. Follow symptoms, if progressing angina may need to consider reevaluating his borderline RCA lesion. Difficult historian due to some chronic memory issues, does report symptoms somewhat similar to what [name] had yesterday when presented with STEMI however objectively so far no evidence of significant ischemia.  - hold on anticoag unless recurrent chest pain, EKG changes, or trop rise      For questions or updates, please contact [facility] Please consult [email or web address] for contact info under     Signed, Carlyle Dolly, MD  03/17/2020 7:48 PM

## 2020-03-17 NOTE — Telephone Encounter (Signed)
Follow up   Attempted to reach triage no one is available. Please call this patient asap.

## 2020-03-17 NOTE — Progress Notes (Signed)
CARDIAC REHAB PHASE I   PRE:  Rate/Rhythm: 81 SR  BP:  Supine:   Sitting: 134/86  Standing:    SaO2: 99%RA  MODE:  Ambulation: 740 ft   POST:  Rate/Rhythm: 104 ST  BP:  Supine:   Sitting: 147/96  Standing:    SaO2: 100%RA [id number] Wife present for education. Pt walked 740 ft on RA with steady gait and no CP. Tolerated well. MI education completed with pt and wife. Wife stated husband will not remember ed but she voiced understanding. Stressed importance of plavix with stent. Reviewed MI restrictions, NTG use, walking for ex, and CRP 2. Wife stated that they received diabetic and heart healthy diets when we educated them after CABG and she feels comfortable with diet ed.  Did not repeat. Referred to [facility] CRP 2.   Graylon Good, RN BSN  03/17/2020 10:22 AM

## 2020-03-17 NOTE — ED Provider Notes (Signed)
[facility] EMERGENCY DEPARTMENT Provider Note   CSN: [id number] Arrival date & time: 03/17/20  1621     History Chief Complaint  Patient presents with  . Chest Pain    [name] is a 74 y.o. male.  The history is provided by the patient, the spouse and medical records. No language interpreter was used.  Chest Pain  [name] is a 74 y.o. male who presents to the Emergency Department complaining of chest pain. He presents the emergency department for evaluation of chest pain that began this afternoon. Yesterday he was admitted for chest pain and back pain and was diagnosed with an acute ST elevation MI and went to the Cath Lab and had two stents placed. He was discharged home today around lunchtime. A few hours after returning home he developed pain in his left chest that he describes as a "dink" sensation. He is experienced 3 to 4 of these episodes today. Episodes are very brief and are described as mild in nature but he does reach over to his chest when he experiences them. The last episode occurred a few hours ago and was three "dinks" in a row. No reports of fevers, diaphoresis, nausea, vomiting, shortness of breath. He received all his morning medicines. He has not yet taken his nighttime metoprolol, which is not due for a few hours. No additional symptoms.    Past Medical History:  Diagnosis Date  . Arthritis   . Arthritis   . Atrial fibrillation ([place])   . BPH (benign prostatic hypertrophy)   . CAD (coronary artery disease)    stents placed  . Diabetes mellitus   . Diverticulosis   . Dyspnea    when exerting self  . Dysrhythmia    "extra beats"  . Esophageal stricture   . GERD (gastroesophageal reflux disease)   . Headache(784.0)   . Hemorrhoids   . Hernia, inguinal, right   . Hiatal hernia   . Hyperlipidemia   . Hypertension   . Pneumonia   . Staph infection    Left ankle  . 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 Urinary hesitancy      Patient Active Problem List   Diagnosis Date Noted  . 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 Postoperative follow-up 01/22/2020  . 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 Unstable angina ([place]) 12/04/2019  . Abnormal nuclear stress test   . Atrial fibrillation ([place]) 04/21/2016  . Pre-syncope 04/10/2016  . Diabetes ([place]) 04/10/2016  . Hyperlipidemia   . Right inguinal hernia 10/18/2011  . Personal history of colonic polyps 06/13/2011  . Guaiac positive stools 06/13/2011  . GERD (gastroesophageal reflux disease) 06/13/2011  . ARTHRITIS 03/03/2009  . DIABETES MELLITUS 03/02/2009  . Essential hypertension 03/02/2009  . Coronary atherosclerosis 03/02/2009  . HEMORRHOIDS 03/02/2009  . ESOPHAGEAL STRICTURE 03/02/2009  . GERD 03/02/2009  . HIATAL HERNIA 03/02/2009  . POLYP, GALLBLADDER 03/02/2009    Past Surgical History:  Procedure Laterality Date  . ANKLE ARTHROPLASTY  2006  . CARDIOVASCULAR STRESS TEST  01/22/2009   EF 51%, NO ISCHEMIA  . CARDIOVASCULAR STRESS TEST    . COLONOSCOPY    . CORONARY ANGIOPLASTY WITH STENT PLACEMENT  03/2005  . CORONARY ARTERY BYPASS GRAFT N/A 12/20/2019   Procedure: CORONARY ARTERY BYPASS GRAFTING (CABG) times four, using left internal mammary artery and left greater saphenous vein;  Surgeon: Ivin Poot, MD;  Location: [facility];  Service: Open Heart Surgery;  Laterality: N/A;  . CORONARY STENT INTERVENTION N/A 03/16/2020   Procedure: CORONARY STENT INTERVENTION;  Surgeon: Martinique, Peter M, MD;  Location: [facility];  Service: Cardiovascular;  Laterality: N/A;  . CORONARY STENT PLACEMENT  03/10/2005   SUCCESSFUL STENTING OF THE MID AND CRUX OF THE RCA  . CORONARY/GRAFT ACUTE MI REVASCULARIZATION N/A 03/16/2020   Procedure: Coronary/Graft Acute MI Revascularization;  Surgeon: Martinique, Peter M, MD;  Location: [facility];  Service: Cardiovascular;  Laterality: N/A;  . ESOPHAGEAL DILATION     multiple procedures  . HERNIA  REPAIR  [id number]   umbilical hernia  . INGUINAL HERNIA REPAIR  11/28/2011   Procedure: HERNIA REPAIR INGUINAL ADULT;  Surgeon: Imogene Burn. Georgette Dover, MD;  Location: [facility];  Service: General;  Laterality: Right;  right inguinal hernia repair with mesh  . KNEE SURGERY     Right  . LEFT HEART CATH AND CORONARY ANGIOGRAPHY N/A 12/03/2019   Procedure: LEFT HEART CATH AND CORONARY ANGIOGRAPHY;  Surgeon: Jettie Booze, MD;  Location: [facility];  Service: Cardiovascular;  Laterality: N/A;  . LEFT HEART CATH AND CORS/GRAFTS ANGIOGRAPHY N/A 03/16/2020   Procedure: LEFT HEART CATH AND CORS/GRAFTS ANGIOGRAPHY;  Surgeon: Martinique, Peter M, MD;  Location: [facility];  Service: Cardiovascular;  Laterality: N/A;  . POLYPECTOMY    . TEE WITHOUT CARDIOVERSION N/A 12/20/2019   Procedure: TRANSESOPHAGEAL ECHOCARDIOGRAM (TEE);  Surgeon: Prescott Gum, Collier Salina, MD;  Location: [facility];  Service: Open Heart Surgery;  Laterality: N/A;  . TONSILLECTOMY  1958  . UMBILICAL HERNIA REPAIR  11/2006  . US ECHOCARDIOGRAPHY         Family History  Problem Relation Age of Onset  . Heart disease Mother   . Heart disease Sister   . Heart disease Brother   . Heart disease Maternal Grandfather   . Ovarian cancer Paternal Grandmother   . Cancer Paternal Grandmother        cervical  . Colon cancer Neg Hx   . Esophageal cancer Neg Hx   . Stomach cancer Neg Hx     Social History   Tobacco Use  . Smoking status: Former Smoker    Packs/day: 1.00    Years: 22.00    Pack years: 22.00    Types: Cigarettes, Pipe    Quit date: 12/17/2014    Years since quitting: 5.2  . Smokeless tobacco: Never Used  Substance Use Topics  . Alcohol use: No    Alcohol/week: 0.0 standard drinks  . Drug use: No    Home Medications Prior to Admission medications   Medication Sig Start Date End Date Taking? Authorizing Provider  acetaminophen (TYLENOL) 650 MG CR tablet Take 1,300 mg by mouth every 8 (eight) hours as needed for  pain.    [provider]  aspirin EC 81 MG tablet Take 1 tablet (81 mg total) by mouth daily. 11/22/19   Martinique, Peter M, MD  B Complex-C (B-COMPLEX WITH VITAMIN C) tablet Take 1 tablet by mouth daily with breakfast.     [provider]  cetirizine (ZYRTEC) 10 MG tablet Take 10 mg by mouth daily.      [provider]  clopidogrel (PLAVIX) 75 MG tablet Take 1 tablet (75 mg total) by mouth daily with breakfast. 03/17/20   Bhagat, Bhavinkumar, PA  fluticasone (FLONASE) 50 MCG/ACT nasal spray Place 2 sprays into both nostrils daily as needed for allergies.     [provider]  hydrocortisone (ANUSOL-HC)  2.5 % rectal cream Place 1 application rectally 2 (two) times daily.    [provider]  lansoprazole (PREVACID) 15 MG capsule Take 15 mg by mouth daily.      [provider]  lisinopril (ZESTRIL) 20 MG tablet Take 1 tablet (20 mg total) by mouth daily. 12/28/19   Elgie Collard, PA-C  metFORMIN (GLUCOPHAGE) 500 MG tablet Take 1 tablet (500 mg total) by mouth 2 (two) times daily with a meal. 12/05/19   Jettie Booze, MD  metoprolol tartrate (LOPRESSOR) 25 MG tablet Take 1 tablet (25 mg total) by mouth 2 (two) times daily. 02/25/20   Martinique, Peter M, MD  Multiple Minerals-Vitamins (CALCIUM-MAGNESIUM-ZINC-D3) TABS Take 1 tablet by mouth daily with lunch.     [provider]  nitroGLYCERIN (NITROSTAT) 0.4 MG SL tablet Place 1 tablet (0.4 mg total) under the tongue every 5 (five) minutes as needed. 03/17/20   Bhagat, Crista Luria, PA  olopatadine (PATANOL) 0.1 % ophthalmic solution Place 1 drop into both eyes 2 (two) times daily as needed for allergies.    [provider]  OneTouch Delica Lancets [id number] MISC 1 each by Other route daily. 02/14/20   [provider]  [facility] VERIO test strip 1 each by Other route daily. 02/13/20   [provider]  Tamsulosin HCl (FLOMAX) 0.4 MG CAPS Take 0.4 mg by mouth daily after supper.      [provider]    Allergies    Statins, Bismuth subsalicylate, Codeine, Kiwi extract, Morphine and related, and Altace [ramipril]  Review of Systems   Review of Systems  Cardiovascular: Positive for chest pain.  All other systems reviewed and are negative.   Physical Exam Updated Vital Signs BP 120/70 (BP Location: Right Arm)   Pulse 63   Temp 98.5 F (36.9 C) (Oral)   Resp 16   SpO2 99%   Physical Exam Vitals and nursing note reviewed.  Constitutional:      Appearance: He is well-developed.  HENT:     Head: Normocephalic and atraumatic.  Cardiovascular:     Rate and Rhythm: Normal rate and regular rhythm.     Heart sounds: No murmur.  Pulmonary:     Effort: Pulmonary effort is normal. No respiratory distress.     Breath sounds: Normal breath sounds.  Abdominal:     Palpations: Abdomen is soft.     Tenderness: There is no abdominal tenderness. There is no guarding or rebound.  Musculoskeletal:        General: No tenderness.     Comments: 2+ radial pulses bilaterally. There is a small hematoma to the left wrist.  Skin:    General: Skin is warm and dry.  Neurological:     Mental Status: He is alert and oriented to person, place, and time.  Psychiatric:        Behavior: Behavior normal.     ED Results / Procedures / Treatments   Labs (all labs ordered are listed, but only abnormal results are displayed) Labs Reviewed  BASIC METABOLIC PANEL - Abnormal; Notable for the following components:      Result Value   Glucose, Bld 286 (*)    Creatinine, Ser 1.42 (*)    Calcium 8.5 (*)    GFR calc non Af Amer 48 (*)    GFR calc Af Amer 56 (*)    All other components within normal limits  CBC - Abnormal; Notable for the following components:   RBC 4.11 (*)  Hemoglobin 11.2 (*)    HCT 35.9 (*)    All other components within normal limits  TROPONIN I (HIGH SENSITIVITY) - Abnormal; Notable for the following components:   Troponin I (High Sensitivity) 105  (*)    All other components within normal limits  TROPONIN I (HIGH SENSITIVITY)    EKG EKG Interpretation  Date/Time:  Tuesday March 17 2020 16:31:56 EDT Ventricular Rate:  80 PR Interval:  166 QRS Duration: 106 QT Interval:  392 QTC Calculation: 452 R Axis:   -62 Text Interpretation: Normal sinus rhythm Left axis deviation Moderate voltage criteria for LVH, may be normal variant ( R in aVL , Cornell product ) Inferior infarct , age undetermined Anterior infarct , age undetermined Abnormal ECG Confirmed by Quintella Reichert [phone]) on 03/17/2020 5:57:47 PM   Radiology DG Chest 2 View  Result Date: 03/16/2020 CLINICAL DATA:  Chest pain EXAM: CHEST - 2 VIEW COMPARISON:  01/22/2020 FINDINGS: Normal heart size. CABG. Stable aortic tortuosity. There is no edema, consolidation, effusion, or pneumothorax. Calcified granuloma in the left mid lung. No acute osseous finding IMPRESSION: No evidence of acute disease. Electronically Signed   By: Monte Fantasia M.D.   On: 03/16/2020 07:13   CARDIAC CATHETERIZATION  Addendum Date: 03/17/2020    Mid LAD-2 lesion is 100% stenosed.  Mid LAD-1 lesion is 80% stenosed.  Prox LAD lesion is 25% stenosed.  Dist Cx lesion is 90% stenosed.  A drug-eluting stent was successfully placed using a [place] [id number].  Post intervention, there is a 0% residual stenosis.  Prox RCA lesion is 65% stenosed.  RPAV lesion is 95% stenosed.  Mid RCA lesion is 30% stenosed.  Dist RCA lesion is 30% stenosed.  2nd Mrg-1 lesion is 99% stenosed.  A drug-eluting stent was successfully placed using a STENT RESOLUTE ONYX 2.5X18.  Post intervention, there is a 0% residual stenosis.  2nd Mrg-2 lesion is 90% stenosed.  A drug-eluting stent was successfully placed using a [place] [id number].  Post intervention, there is a 0% residual stenosis.  LIMA graft was visualized by angiography and is normal in caliber.  The graft exhibits no disease.  SVG graft was  visualized by angiography and is normal in caliber.  The graft exhibits no disease.  SVG graft was visualized by angiography.  SVG graft was visualized by angiography.  Origin lesion is 100% stenosed.  Origin to Prox Graft lesion is 100% stenosed.  LV end diastolic pressure is normal.  1. Severe 3 vessel obstructive CAD 2. Patent LIMA to the LAD 3. Patent SVG to the diagonal 4. Occluded SVG to OM2. The graft appeared to insert into a segment of severe disease 5. Occluded SVG to RCA possibly due to competitive filling from the native vessel 6. Successful PCI of OM2 with DES x 2 in proximal and distal vessel 7. Successful PCI of the distal LCx with DES x 1. 8. Normal LVEDP Plan: DAPT for at least one year. Will DC Eliquis since he has had no Afib since DC from surgery. DC amiodarone. Intolerant to multiple cholesterol lowering drugs in the past. Will see lipid clinic as outpatient to get started on PCSK 9 inhibitor. I doubt the lesion in the proximal RCA is flow limiting so we will treat this medically for now. If he has recurrent angina we could consider PCI of this vessel. Patient may be a candidate for DC tomorrow if clinically stable.   Result Date: 03/16/2020  Mid LAD-2 lesion is 100%  stenosed.  Mid LAD-1 lesion is 80% stenosed.  Prox LAD lesion is 25% stenosed.  Dist Cx lesion is 90% stenosed.  A drug-eluting stent was successfully placed using a [place] [id number].  Post intervention, there is a 0% residual stenosis.  Prox RCA lesion is 65% stenosed.  RPAV lesion is 95% stenosed.  Mid RCA lesion is 30% stenosed.  Dist RCA lesion is 30% stenosed.  2nd Mrg-1 lesion is 99% stenosed.  A drug-eluting stent was successfully placed using a STENT RESOLUTE ONYX 2.5X18.  Post intervention, there is a 0% residual stenosis.  2nd Mrg-2 lesion is 90% stenosed.  A drug-eluting stent was successfully placed using a [place] [id number].  Post intervention, there is a 0% residual  stenosis.  LIMA graft was visualized by angiography and is normal in caliber.  The graft exhibits no disease.  SVG graft was visualized by angiography and is normal in caliber.  The graft exhibits no disease.  SVG graft was visualized by angiography.  SVG graft was visualized by angiography.  Origin lesion is 100% stenosed.  Origin to Prox Graft lesion is 100% stenosed.  LV end diastolic pressure is normal.  1. Severe 3 vessel obstructive CAD 2. Patent LIMA to the LAD 3. Patent SVG to the diagonal 4. Occluded SVG to OM2. The graft appeared to insert into a segment of severe disease 5. Occluded SVG to RCA possibly due to competitive filling from the native vessel 6. Successful PCI of OM2 with DES x 2 in proximal and distal vessel 7. Successful PCI of the distal LCx with DES x 1. 8. Normal LVEDP Plan: DAPT for at least one year. Will DC Eliquis since he has had no Afib since DC from surgery. DC amiodarone. Intolerant to multiple cholesterol lowering drugs in the past. Will see lipid clinic as outpatient to get started on PCSK 9 inhibitor. I doubt the lesion in the proximal RCA is flow limiting so we will treat this medically for now. If he has recurrent angina we could consider PCI of this vessel. Patient may be a candidate for DC tomorrow if clinically stable.    Procedures Procedures (including critical care time)  Medications Ordered in ED Medications  sodium chloride flush (NS) 0.9 % injection 3 mL (3 mLs Intravenous Not Given 03/17/20 1755)    ED Course  I have reviewed the triage vital signs and the nursing notes.  Pertinent labs & imaging results that were available during my care of the patient were reviewed by me and considered in my medical decision making (see chart for details).    MDM Rules/Calculators/A&P                     Patient with recent ST elevation MI and stent placement here for evaluation of recurrent chest pain. He is pain free on ED evaluation, EKG without acute  ischemic changes. Troponin is elevated but down trending from his recent hospitalization. Given patient's recent MI, cardiology was consulted. Plan to admit for further evaluation and treatment.  Final Clinical Impression(s) / ED Diagnoses Final diagnoses:  None    Rx / DC Orders ED Discharge Orders    None       Quintella Reichert, MD 03/17/20 2106

## 2020-03-17 NOTE — Research (Addendum)
[facility] Research Study  90 Day Randomized Arm Visit   Patient feeling better today, had Left Heart Cath yesterday from STEMI and had a drug-eluting stent placed.  Visit 10 labs and urine collected.  This completes the patients participation in the [facility].   No current facility-administered medications for this visit.  Current Outpatient Medications:  .  clopidogrel (PLAVIX) 75 MG tablet, Take 1 tablet (75 mg total) by mouth daily with breakfast., Disp: 30 tablet, Rfl: 11 .  nitroGLYCERIN (NITROSTAT) 0.4 MG SL tablet, Place 1 tablet (0.4 mg total) under the tongue every 5 (five) minutes as needed., Disp: 25 tablet, Rfl: 12  Facility-Administered Medications Ordered in Other Visits:  .  0.9 %  sodium chloride infusion, , Intravenous, Continuous, Martinique, Peter M, MD, Last Rate: 10 mL/hr at 03/16/20 0928, New Bag at 03/16/20 0928 .  0.9 %  sodium chloride infusion, 250 mL, Intravenous, PRN, Martinique, Peter M, MD .  acetaminophen (TYLENOL) tablet 650 mg, 650 mg, Oral, Q4H PRN, Martinique, Peter M, MD .  aspirin chewable tablet 81 mg, 81 mg, Oral, Daily, Martinique, Peter M, MD .  B-complex with vitamin C tablet 3 tablet, 3 tablet, Oral, Q breakfast, Martinique, Peter M, MD .  Chlorhexidine Gluconate Cloth 2 % PADS 6 each, 6 each, Topical, Daily, Martinique, Peter M, MD, 6 each at 03/16/20 1456 .  clopidogrel (PLAVIX) tablet 75 mg, 75 mg, Oral, Q breakfast, Martinique, Peter M, MD .  enoxaparin (LOVENOX) injection 40 mg, 40 mg, Subcutaneous, Q24H, Martinique, Peter M, MD .  fluticasone [facility]) 50 MCG/ACT nasal spray 2 spray, 2 spray, Each Nare, Daily PRN, Martinique, Peter M, MD .  hydrocortisone (ANUSOL-HC) 2.5 % rectal cream 1 application, 1 application, Rectal, BID PRN, Martinique, Peter M, MD .  lisinopril (ZESTRIL) tablet 20 mg, 20 mg, Oral, Daily, Martinique, Peter M, MD, 20 mg at 03/16/20 1243 .  loratadine (CLARITIN) tablet 10 mg, 10 mg, Oral, Daily, Martinique, Peter M, MD, 10 mg at 03/16/20 1243 .   metoprolol tartrate (LOPRESSOR) tablet 25 mg, 25 mg, Oral, BID, Martinique, Peter M, MD, 25 mg at 03/16/20 2229 .  ondansetron (ZOFRAN) injection 4 mg, 4 mg, Intravenous, Q6H PRN, Martinique, Peter M, MD .  pantoprazole (PROTONIX) EC tablet 40 mg, 40 mg, Oral, Daily, Martinique, Peter M, MD, 40 mg at 03/16/20 1243 .  sodium chloride flush (NS) 0.9 % injection 3 mL, 3 mL, Intravenous, Once, Martinique, Peter M, MD .  sodium chloride flush (NS) 0.9 % injection 3 mL, 3 mL, Intravenous, Q12H, Martinique, Peter M, MD, 10 mL at 03/16/20 2000 .  sodium chloride flush (NS) 0.9 % injection 3 mL, 3 mL, Intravenous, PRN, Martinique, Peter M, MD .  tamsulosin [facility]) capsule 0.4 mg, 0.4 mg, Oral, QPC supper, Martinique, Peter M, MD, 0.4 mg at 03/16/20 1850   [id number]  MOBILITY:    I HAVE NO PROBLEMS WALKING [x]   I HAVE SLIGHT PROBLEMS WALKING []   I HAVE MODERATE PROBLEMS WALKING []   I HAVE SEVERE PROBLEMS WALKING []   I AM UNABLE TO WALK  []     SELF-CARE:   I HAVE NO PROBLEMS WASHING OR DRESSING MYSELF  [x]   I HAVE SLIGHT PROBLEMS WASHING OR DRESSING MYSELF  []   I HAVE MODERATE PROBLEMS WASHING OR DRESSING MYSELF []   I HAVE SEVERE PROBLEMS WASHING OR DRESSING MYSELF  []   I HAVE SEVERE PROBLEMS WASHING OR DRESSING MYSELF  []   I AM UNABLE TO WASH OR DRESS MYSELF []   USUAL ACTIVITIES: (E.G. WORK/STUDY/HOUSEWORK/FAMILY OR LEISURE ACTIVITIES.    I HAVE NO PROBLEMS DOING MY USUAL ACTIVITIES [x]   I HAVE SLIGHT PROBLEMS DOING MY USUAL ACTIVITIES []   I HAVE MODERATE PROBLEMS DOING MY USUAL ACTIVIITIES []   I HAVE SEVERE PROBLEMS DOING MY USUAL ACTIVITIES []   I AM UNABLE TO DO MY USUAL ACTIVITIES []     PAIN /DISCOMFORT   I HAVE NO PAIN OR DISCOMFORT [x]   I HAVE SLIGHT PAIN OR DISCOMFORT []   I HAVE MODERATE PAIN OR DISCOMFORT []   I HAVE SEVERE PAIN OR DISCOMFORT []   I HAVE EXTREME PAIN OR DISCOMFORT []     ANXIETY/DEPRESSION   I AM NOT ANXIOUS OR DEPRESSED [x]   I AM SLIGHTLY ANXIOUS OR DEPRESSED []   I AM MODERATELY ANXIOUS  OR DREPRESSED []   I AM SEVERELY ANXIOUS OR DEPRESSED []   I AM EXTREMELY ANXIOUS OR DEPRESSED []     SCALE OF 0-100 HOW WOULD YOU RATE TODAY?  0 IS THE WORSE AND 100 IS THE BEST HEALTH YOU CAN IMAGINE: 70         ABNORMAL LABS   []    No change or change not clinically significant. NO FOLLOW UP REQUIRED. [x]    Change clinically significant and attributable to disease or management. NO FOLLOW  UP REQUIRED. []    Change clinically significant and possibly attributable to study medication. NO FOLLOW UP REQUIRED. []    Change clinically significant and attributable to study medication. FOLLOW UP REQUIRED. []    Apparent lab error. []    Unevaluable.

## 2020-03-17 NOTE — ED Notes (Signed)
Cards at bedside

## 2020-03-17 NOTE — Telephone Encounter (Signed)
TOC per Vin Bhagat with [facility] on 03/30/20 at 11:15am.

## 2020-03-18 ENCOUNTER — Encounter: Payer: BC Managed Care – PPO | Admitting: Cardiothoracic Surgery

## 2020-03-18 ENCOUNTER — Other Ambulatory Visit: Payer: Self-pay

## 2020-03-18 DIAGNOSIS — I252 Old myocardial infarction: Secondary | ICD-10-CM

## 2020-03-18 DIAGNOSIS — I25810 Atherosclerosis of coronary artery bypass graft(s) without angina pectoris: Secondary | ICD-10-CM

## 2020-03-18 DIAGNOSIS — R072 Precordial pain: Secondary | ICD-10-CM

## 2020-03-18 LAB — HEMOGLOBIN A1C
Hgb A1c MFr Bld: 7.2 % — ABNORMAL HIGH (ref 4.8–5.6)
Mean Plasma Glucose: 160 mg/dL

## 2020-03-18 LAB — CBG MONITORING, ED
Glucose-Capillary: 188 mg/dL — ABNORMAL HIGH (ref 70–99)
Glucose-Capillary: 95 mg/dL (ref 70–99)

## 2020-03-18 LAB — SARS CORONAVIRUS 2 (TAT 6-24 HRS): SARS Coronavirus 2: NEGATIVE

## 2020-03-18 MED ORDER — ZOLPIDEM TARTRATE 5 MG PO TABS: 2.5000 mg | ORAL_TABLET | Freq: Every evening | ORAL | Status: DC | PRN

## 2020-03-18 MED ORDER — METOPROLOL TARTRATE 37.5 MG PO TABS: 37.5000 mg | ORAL_TABLET | Freq: Two times a day (BID) | ORAL | 6 refills | Status: DC

## 2020-03-18 MED ORDER — ISOSORBIDE MONONITRATE ER 30 MG PO TB24: 15.0000 mg | ORAL_TABLET | Freq: Every day | ORAL | 6 refills | Status: DC

## 2020-03-18 NOTE — Telephone Encounter (Signed)
Patient is currently admitted

## 2020-03-18 NOTE — ED Notes (Signed)
Tele   bfast ordered  

## 2020-03-18 NOTE — Discharge Summary (Signed)
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 male with history of CAD s/p CABG January 2021,post opatrial fibrillation on anticoagulation, hypertension, type 2 diabetes and hyperlipidemia, admitted with STEMI with lateral ST elevations and troponin to 175, 03/16/20  With emergent cath and occluded VG to OM2 and RCA underwent PCI to OM2 with DES X 2 in proximal and distal vessel and PCI to distal LCX DES.  Eliquis stopped with no AFib and d/c;d 03/17/20    Returned to ER last pm 03/17/20 with sharp lt upper chest pain that lasted a few seconds.  Similar to pain with STEMI but nothing as severe.  Pt had 2 episodes of sharp pain and his wife brought to ER.  EKG was without acute changes. With inferior Q waves.   K+ 4.3, Cr 1.42   Troponin was decreasing from hospitalization 4/12-/4/13 (105, 96,101,94)  COVID neg  Echo in Jan 2021 was EF 55-60% pt admitted for eval and to monitor for further pain   Hospital Course     Consultants: none   Pt did well overnight and troponins continue down or are flat, no acute injury this admit.  meds have been adjusted with increase of BB and Imdur.  He has been seen  this AM by Dr. Stanford Breed, and found stable for discharge.  No chest pain.  Will keep follow ups.   Did the patient have an acute coronary syndrome (MI, NSTEMI, STEMI, etc) this admission?:  No                               Did the patient have a percutaneous coronary intervention (stent / angioplasty)?:  No.   _____________  Discharge Vitals Blood pressure (!) 146/84, pulse 64, temperature 98.4 F (36.9 C), temperature source Oral, resp. rate 10, SpO2 96 %.  There were no vitals filed for this visit.  Labs & Radiologic Studies    CBC Recent Labs    03/17/20 1637 03/17/20 2040  WBC 5.2 5.0  HGB 11.2* 11.9*  HCT 35.9* 37.7*  MCV 87.3 87.7  PLT 181 0000000   Basic Metabolic Panel Recent Labs    03/17/20 0103 03/17/20 0103 03/17/20 1637 03/17/20 2040  NA 138  --  138  --   K 4.3  --  3.9  --   CL 105  --  102  --   CO2 23  --  24  --   GLUCOSE 106*  --  286*  --   BUN 16  --  16  --   CREATININE 1.14   < > 1.42* 1.30*  CALCIUM 8.6*  --  8.5*  --    < > = values in this interval not displayed.   Liver Function Tests No results for input(s): AST, ALT, ALKPHOS, BILITOT, PROT, ALBUMIN in the last 72 hours. No results for input(s): LIPASE, AMYLASE in the last 72 hours. High Sensitivity Troponin:   Recent Labs  Lab 03/16/20 1230 03/17/20 1637 03/17/20 1833 03/17/20 2040 03/17/20 2255  TROPONINIHS 175* 105* 96* 101* 94*    BNP Invalid input(s): POCBNP D-Dimer No results for input(s): DDIMER in the last 72 hours. Hemoglobin A1C Recent Labs    03/17/20 0103  HGBA1C 7.2*   Fasting Lipid Panel Recent Labs    03/16/20 0646  CHOL 205*  HDL 47  LDLCALC 137*  TRIG 107  CHOLHDL 4.4   Thyroid Function Tests No results for input(s): TSH, T4TOTAL, T3FREE, THYROIDAB in the last 72 hours.  Invalid input(s): FREET3 _____________  DG Chest 2 View  Result Date: 03/16/2020 CLINICAL DATA:  Chest pain EXAM: CHEST - 2 VIEW COMPARISON:  01/22/2020 FINDINGS: Normal heart  size. CABG. Stable aortic tortuosity. There is no edema, consolidation, effusion, or pneumothorax. Calcified granuloma in the left mid lung. No acute osseous finding IMPRESSION: No evidence of acute disease. Electronically Signed   By: Monte Fantasia M.D.   On: 03/16/2020 07:13   CARDIAC CATHETERIZATION  Addendum Date: 03/17/2020    Mid LAD-2 lesion is 100% stenosed.  Mid LAD-1 lesion is 80% stenosed.  Prox LAD lesion is 25% stenosed.  Dist Cx lesion is 90% stenosed.  A drug-eluting stent was successfully placed using a [place] [id number].  Post intervention, there is a 0% residual stenosis.  Prox RCA lesion is 65% stenosed.  RPAV lesion is 95% stenosed.  Mid RCA lesion is 30% stenosed.  Dist RCA lesion is 30% stenosed.  2nd Mrg-1 lesion is 99% stenosed.  A drug-eluting stent was successfully placed using a STENT RESOLUTE ONYX 2.5X18.  Post intervention, there is a 0% residual stenosis.  2nd Mrg-2 lesion is 90% stenosed.  A drug-eluting stent was successfully placed using a [place] [id number].  Post intervention, there is a 0% residual stenosis.  LIMA graft was visualized by angiography and is normal in caliber.  The graft exhibits no disease.  SVG graft was visualized by angiography and is normal in caliber.  The graft exhibits no disease.  SVG graft was visualized by angiography.  SVG graft was visualized by angiography.  Origin lesion is 100% stenosed.  Origin to Prox Graft lesion is 100% stenosed.  LV end diastolic pressure is normal.  1. Severe 3 vessel obstructive CAD 2. Patent LIMA to the LAD 3. Patent SVG to the diagonal 4. Occluded SVG to OM2. The graft appeared to insert into a segment of severe disease 5. Occluded SVG to RCA possibly due to competitive filling from the native vessel 6. Successful PCI of OM2 with DES x 2 in proximal and distal vessel 7. Successful PCI of the distal LCx with DES x 1. 8. Normal LVEDP Plan: DAPT for at least one year. Will DC  Eliquis since he has had no Afib since DC from surgery. DC amiodarone. Intolerant to multiple cholesterol lowering drugs in the past. Will see lipid clinic as outpatient to get started on PCSK 9 inhibitor. I doubt the lesion in the proximal RCA is flow limiting so we will treat this medically for now. If he has recurrent angina we could consider PCI of this vessel. Patient may be  a candidate for DC tomorrow if clinically stable.   Result Date: 03/16/2020  Mid LAD-2 lesion is 100% stenosed.  Mid LAD-1 lesion is 80% stenosed.  Prox LAD lesion is 25% stenosed.  Dist Cx lesion is 90% stenosed.  A drug-eluting stent was successfully placed using a [place] [id number].  Post intervention, there is a 0% residual stenosis.  Prox RCA lesion is 65% stenosed.  RPAV lesion is 95% stenosed.  Mid RCA lesion is 30% stenosed.  Dist RCA lesion is 30% stenosed.  2nd Mrg-1 lesion is 99% stenosed.  A drug-eluting stent was successfully placed using a STENT RESOLUTE ONYX 2.5X18.  Post intervention, there is a 0% residual stenosis.  2nd Mrg-2 lesion is 90% stenosed.  A drug-eluting stent was successfully placed using a [place] [id number].  Post intervention, there is a 0% residual stenosis.  LIMA graft was visualized by angiography and is normal in caliber.  The graft exhibits no disease.  SVG graft was visualized by angiography and is normal in caliber.  The graft exhibits no disease.  SVG graft was visualized by angiography.  SVG graft was visualized by angiography.  Origin lesion is 100% stenosed.  Origin to Prox Graft lesion is 100% stenosed.  LV end diastolic pressure is normal.  1. Severe 3 vessel obstructive CAD 2. Patent LIMA to the LAD 3. Patent SVG to the diagonal 4. Occluded SVG to OM2. The graft appeared to insert into a segment of severe disease 5. Occluded SVG to RCA possibly due to competitive filling from the native vessel 6. Successful PCI of OM2 with DES x 2 in proximal and  distal vessel 7. Successful PCI of the distal LCx with DES x 1. 8. Normal LVEDP Plan: DAPT for at least one year. Will DC Eliquis since he has had no Afib since DC from surgery. DC amiodarone. Intolerant to multiple cholesterol lowering drugs in the past. Will see lipid clinic as outpatient to get started on PCSK 9 inhibitor. I doubt the lesion in the proximal RCA is flow limiting so we will treat this medically for now. If he has recurrent angina we could consider PCI of this vessel. Patient may be a candidate for DC tomorrow if clinically stable.   Disposition   Pt is being discharged home today in good condition.  Follow-up Plans & Appointments   See discharge instructions from discharge yesterday.   We did add imdur half a tablet per day to help prevent chest pain, take daily   We also increased your metoprolol to 1.5 pills twice per day.  Follow-up Information    Roby Lofts M., PA-C Follow up on 03/30/2020.   Specialty: Physician Assistant Why: at 11:15 AM Contact information: [address] [place] [address] [place] [address] [phone]        Martinique, Peter M, MD Follow up on 04/06/2020.   Specialty: Cardiology Why: at 8:40 AM Contact information: [address] [place] [address] [phone]        Ivin Poot, MD Follow up on 04/08/2020.   Specialty: Cardiothoracic Surgery Why: at 3:30 PM Contact information: [address] [place] [address] [phone]            Discharge Medications   Allergies as of 03/18/2020      Reactions   Statins Anaphylaxis   Myalgias   Bismuth Subsalicylate Nausea And Vomiting   Codeine Nausea And Vomiting   Kiwi Extract    Makes face  red, break out into bumps on body.    Morphine And Related Nausea And Vomiting   Altace [ramipril] Rash      Medication List    TAKE these medications   acetaminophen 650 MG CR tablet Commonly known as: TYLENOL Take 1,300 mg by mouth every 8  (eight) hours as needed for pain.   aspirin EC 81 MG tablet Take 1 tablet (81 mg total) by mouth daily.   B-complex with vitamin C tablet Take 1 tablet by mouth daily with breakfast.   Calcium-Magnesium-Zinc-D3 Tabs Take 1 tablet by mouth daily with lunch.   cetirizine 10 MG tablet Commonly known as: ZYRTEC Take 10 mg by mouth daily.   clopidogrel 75 MG tablet Commonly known as: PLAVIX Take 1 tablet (75 mg total) by mouth daily with breakfast.   fluticasone 50 MCG/ACT nasal spray Commonly known as: FLONASE Place 2 sprays into both nostrils daily as needed for allergies.   isosorbide mononitrate 30 MG 24 hr tablet Commonly known as: IMDUR Take 0.5 tablets (15 mg total) by mouth daily. Start taking on: March 19, 2020   lansoprazole 15 MG capsule Commonly known as: PREVACID Take 15 mg by mouth daily.   lisinopril 20 MG tablet Commonly known as: ZESTRIL Take 1 tablet (20 mg total) by mouth daily.   metFORMIN 500 MG tablet Commonly known as: GLUCOPHAGE Take 1 tablet (500 mg total) by mouth 2 (two) times daily with a meal.   Metoprolol Tartrate 37.5 MG Tabs Take 37.5 mg by mouth 2 (two) times daily. What changed:   medication strength  how much to take   nitroGLYCERIN 0.4 MG SL tablet Commonly known as: Nitrostat Place 1 tablet (0.4 mg total) under the tongue every 5 (five) minutes as needed.   OneTouch Delica Lancets 99991111 Misc 1 each by Other route daily.   OneTouch Verio test strip Generic drug: glucose blood 1 each by Other route daily.   tamsulosin 0.4 MG Caps capsule Commonly known as: FLOMAX Take 0.4 mg by mouth daily after supper.          Outstanding Labs/Studies   Consider BMP   Duration of Discharge Encounter   Greater than 30 minutes including physician time.  Signed, Cecilie Kicks, NP 03/18/2020, 12:35 PM

## 2020-03-18 NOTE — Discharge Instructions (Signed)
See discharge instructions from discharge yesterday.   We did add imdur half a tablet per day to help prevent chest pain, take daily   We also increased your metoprolol to 1.5 pills twice per day.

## 2020-03-18 NOTE — ED Notes (Signed)
Pt arrived to room confused, pacing and wandering down the hallway looking for his watch. Patient agitated and states that he was in the "dental ward" and unable to recall the year. Redirected patient to room. Pt originally refused to wear monitoring equipment. Pt's cardiologist called by this nurse and I requested a mild sleep aid. Pt refused to take sleep aid. Contacted pt's wife, Nevin Bloodgood, to assist with calming patient down. She explained to patient that she had his watch and wallet. She also encouraged pt lied down and rest. Will continue to monitor.

## 2020-03-18 NOTE — Progress Notes (Addendum)
Progress Note  Patient Name: [name] Date of Encounter: 03/18/2020  Primary Cardiologist: Peter Martinique, MD   Subjective   No further pain.  Feels well. Poor memory   Inpatient Medications    Scheduled Meds: . aspirin EC  81 mg Oral Daily  . B-complex with vitamin C  1 tablet Oral Q breakfast  . calcium carbonate  1,250 mg Oral Q lunch  . clopidogrel  75 mg Oral Q breakfast  . heparin  5,000 Units Subcutaneous Q8H  . insulin aspart  0-15 Units Subcutaneous TID WC  . isosorbide mononitrate  15 mg Oral Daily  . lisinopril  20 mg Oral Daily  . loratadine  10 mg Oral Daily  . metoprolol tartrate  37.5 mg Oral BID  . pantoprazole  20 mg Oral Daily  . sodium chloride flush  3 mL Intravenous Once  . tamsulosin  0.4 mg Oral QPC supper   Continuous Infusions:  PRN Meds: acetaminophen, nitroGLYCERIN, ondansetron (ZOFRAN) IV, zolpidem   Vital Signs    Vitals:   03/18/20 0815 03/18/20 0845 03/18/20 0915 03/18/20 1000  BP:      Pulse: 77 76 75 74  Resp: (!) 21 15 (!) 22 (!) 0  Temp:      TempSrc:      SpO2: 100% 100% 96% 100%   No intake or output data in the 24 hours ending 03/18/20 1046 Last 3 Weights 03/16/2020 01/22/2020 01/15/2020  Weight (lbs) 176 lb 5.9 oz 152 lb 152 lb 9.6 oz  Weight (kg) 80 kg 68.947 kg 69.219 kg      Telemetry    SR - Personally Reviewed  ECG    No new - Personally Reviewed  Physical Exam   GEN: No acute distress.   Neck: No JVD Cardiac: RRR, no murmurs, rubs, or gallops.  Respiratory: Clear to auscultation bilaterally. GI: Soft, nontender, non-distended  MS: No edema; No deformity. Neuro:  Nonfocal  Psych: Normal affect   Labs    High Sensitivity Troponin:   Recent Labs  Lab 03/16/20 1230 03/17/20 1637 03/17/20 1833 03/17/20 2040 03/17/20 2255  TROPONINIHS 175* 105* 96* 101* 94*      Chemistry Recent Labs  Lab 03/16/20 0646 03/16/20 0646 03/16/20 0800 03/17/20 0103 03/17/20 1637 03/17/20 2040  NA 140   <  > 138 138 138  --   K 3.8   < > 3.8 4.3 3.9  --   CL 103   < > 101 105 102  --   CO2 27  --   --  23 24  --   GLUCOSE 172*   < > 168* 106* 286*  --   BUN 19   < > 19 16 16   --   CREATININE 1.20   < > 1.10 1.14 1.42* 1.30*  CALCIUM 8.9  --   --  8.6* 8.5*  --   GFRNONAA 59*   < >  --  >60 48* 54*  GFRAA >60   < >  --  >60 56* >60  ANIONGAP 10  --   --  10 12  --    < > = values in this interval not displayed.     Hematology Recent Labs  Lab 03/17/20 0103 03/17/20 1637 03/17/20 2040  WBC 6.3 5.2 5.0  RBC 4.01* 4.11* 4.30  HGB 11.1* 11.2* 11.9*  HCT 35.0* 35.9* 37.7*  MCV 87.3 87.3 87.7  MCH 27.7 27.3 27.7  MCHC 31.7 31.2 31.6  RDW  13.2 13.3 13.4  PLT 185 181 179    BNPNo results for input(s): BNP, PROBNP in the last 168 hours.   DDimer No results for input(s): DDIMER in the last 168 hours.   Radiology    No results found.  Cardiac Studies   LEFT HEART CATH AND CORS/GRAFTS ANGIOGRAPHY  Conclusion    Mid LAD-2 lesion is 100% stenosed.  Mid LAD-1 lesion is 80% stenosed.  Prox LAD lesion is 25% stenosed.  Dist Cx lesion is 90% stenosed.  A drug-eluting stent was successfully placed using a [place] [id number].  Post intervention, there is a 0% residual stenosis.  Prox RCA lesion is 65% stenosed.  RPAV lesion is 95% stenosed.  Mid RCA lesion is 30% stenosed.  Dist RCA lesion is 30% stenosed.  2nd Mrg-1 lesion is 99% stenosed.  A drug-eluting stent was successfully placed using a STENT RESOLUTE ONYX 2.5X18.  Post intervention, there is a 0% residual stenosis.  2nd Mrg-2 lesion is 90% stenosed.  A drug-eluting stent was successfully placed using a [place] [id number].  Post intervention, there is a 0% residual stenosis.  LIMA graft was visualized by angiography and is normal in caliber.  The graft exhibits no disease.  SVG graft was visualized by angiography and is normal in caliber.  The graft exhibits no disease.  SVG  graft was visualized by angiography.  SVG graft was visualized by angiography.  Origin lesion is 100% stenosed.  Origin to Prox Graft lesion is 100% stenosed.  LV end diastolic pressure is normal.  1. Severe 3 vessel obstructive CAD 2. Patent LIMA to the LAD 3. Patent SVG to the diagonal 4. Occluded SVG to OM2. The graft appeared to insert into a segment of severe disease 5. Occluded SVG to RCA possibly due to competitive filling from the native vessel 6. Successful PCI of OM2 with DES x 2 in proximal and distal vessel 7. Successful PCI of the distal LCx with DES x 1.  8. Normal LVEDP  Plan: DAPT for at least one year. Will DC Eliquis since he has had no Afib since DC from surgery. DC amiodarone. Intolerant to multiple cholesterol lowering drugs in the past. Will see lipid clinic as outpatient to get started on PCSK 9 inhibitor. I doubt the lesion in the proximal RCA is flow limiting so we will treat this medically for now. If he has recurrent angina we could consider PCI of this vessel. Patient may be a candidate for DC tomorrow if clinically stable.    Diagnostic Dominance: Right  Intervention        Patient Profile     74 y.o. male  history of CAD s/p CABG January 2021,post op atrial fibrillation on anticoagulation, hypertension, type 2 diabetes and hyperlipidemia, admitted with STEMI with lateral ST elevations and troponin to 175, 03/16/20  With emergent cath and occluded VG to OM2 and RCA underwent PCI to OM2 with DES X 2 in proximal and distal vessel and PCI to distal LCX DES.  Eliquis stopped with no AFib and d/c;d 03/17/20    Returned to ER last pm with sharp lt upper chest pain that lasted a few seconds.  Similar to pain with STEMI    Assessment & Plan    CAD with recurrent chest pain.  troponins continue to trend down.  EKG without acute changes.  No further pain.  Dr. Stanford Breed to see possible discharge.   STEMI 03/16/20 with PCI to OM and LCX Borderline RCA  thought not to be flow limiting but would consider intervention if recurrent angnia, on ASA plavix, lisinopril and lopressor, intolerant to statins will need pcsk9 as outpt.   loressor increased and imdur added.    HLD see above   PAF hx of a fib post CABG, no recurrence now off amiodarone and anticoagulant   DM-2 per PCP   Follow ups with Cherlynn Polo next week and Dr. Martinique and Dr. Prescott Gum the week or 2 after.     For questions or updates, please contact [place] Please consult [email or web address] for contact info under   Signed, Cecilie Kicks, NP  03/18/2020, 10:46 AM   As above, patient seen and examined.  He denies recurrent chest pain.  No dyspnea.  Patient states that his pain yesterday was 3 short episodes each lasting 1 second.  He has had no recurrences.  Electrocardiogram shows no ST changes.  Troponins are trending down from recent event.  Patient's metoprolol was increased and Imdur added.  Will discharge today.  Patient has a follow-up with APP next week and Dr. Martinique in early May.  > 30 min PA and physician time D2  Kirk Ruths, MD

## 2020-03-18 NOTE — ED Notes (Signed)
Admitting paged by [facility]

## 2020-03-19 ENCOUNTER — Ambulatory Visit: Payer: BC Managed Care – PPO | Admitting: Cardiology

## 2020-03-19 ENCOUNTER — Encounter: Payer: BC Managed Care – PPO | Admitting: Cardiothoracic Surgery

## 2020-03-19 NOTE — Telephone Encounter (Signed)
Left message to call back  

## 2020-03-20 NOTE — Telephone Encounter (Signed)
Patient contacted regarding discharge from [facility] on 4/13.  Patient understands to follow up with provider Roby Lofts, PA on 04/26 at 11:15 AM at [facility]. Patient understands discharge instructions? Yes Patient understands medications and regiment? Yes Patient understands to bring all medications to this visit? Yes   Patient states they were not able to get into lipid clinic until 05/11 and wanted Dr.Jordan to be aware.  Advised I would send a message and make him aware of if any changes to this appointment should be made.  Wife and patient verbalized understanding.

## 2020-03-28 NOTE — Progress Notes (Signed)
Cardiology Office Note   Date:  03/31/2020   ID:  [name], DOB 24-Jun-1946, MRN [id number]  PCP:  Marton Redwood, MD  Cardiologist:  Peter Martinique, MD EP: None  Chief Complaint  Patient presents with  . Hospitalization Follow-up      History of Present Illness: [name] is a 74 y.o. male with PMH of CAD s/p CABG 12/2019 with recent STEMI 03/16/20 with PCI/DES of OM2 x2 and PCI/DES of dLCx, post-op atrial fibrillation, HTN, HLD, and DM type 2, who presents for post-hospital follow-up.   He was admitted to [facility] from 03/16/20-03/17/20 after presenting with chest pain where he was found to have a STEMI. He was taken emergently to the cath lab which revealed occluded SVG to OM2 and RCA. Patent LIMA to LAD and SVG to diagonal. OM 2 has two separate high grade stenosis s/p successful PCI of OM2 with DES x 2 in proximal and distal vessel. Also underwent successful PCI of the distal LCx (90% stenosis) with DES x 1. He did have a 70% proximal RCA on first band and a diffusely diseased PLA branch which fills by left-to-right collaterals medically managed with consideration PCI to RCA if refractory angina. He was recommended for DAPT with aspirin and plavix. He was recommended to follow-up with the lipid clinic for PSK9-I consideration given prior intolerance to statins and LDL 137 03/16/20. His anticoagulation and amiodarone were discontinued given Afib history occurred following CABG without reoccurrence. He continued to have a few episodes of sharp chest pain lasting for seconds upon return home 03/17/20 and re-presented to the ED that evening. Again admitted 03/17/20-03/18/20 for observation. Home metoprolol was increased and he was started on imdur at that time.   He presents today for routine post-hospital follow-up. He continues to have intermittent chest discomfort but notes this is worse with certain movements and his chest wall is tender to touch. There is no exertional component to  the chest pain. No associated diaphoresis, SOB, dizziness, lightheadedness, nausea, or vomiting. He notes some improvement with tylenol. No palpitations, dizziness, lightheadedness, or syncope.      Past Medical History:  Diagnosis Date  . Arthritis   . Arthritis   . Atrial fibrillation ([address])   . BPH (benign prostatic hypertrophy)   . CAD (coronary artery disease)    stents placed  . Diabetes mellitus   . Diverticulosis   . Dyspnea    when exerting self  . Dysrhythmia    "extra beats"  . Esophageal stricture   . GERD (gastroesophageal reflux disease)   . Headache(784.0)   . Hemorrhoids   . Hernia, inguinal, right   . Hiatal hernia   . Hyperlipidemia   . Hypertension   . Pneumonia   . Staph infection    Left ankle  . 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 Urinary hesitancy     Past Surgical History:  Procedure Laterality Date  . ANKLE ARTHROPLASTY  2006  . CARDIOVASCULAR STRESS TEST  01/22/2009   EF 51%, NO ISCHEMIA  . CARDIOVASCULAR STRESS TEST    . COLONOSCOPY    . CORONARY ANGIOPLASTY WITH STENT PLACEMENT  03/2005  . CORONARY ARTERY BYPASS GRAFT N/A 12/20/2019   Procedure: CORONARY ARTERY BYPASS GRAFTING (CABG) times four, using left internal mammary artery and left greater saphenous vein;  Surgeon: Ivin Poot, MD;  Location: [place];  Service: Open Heart Surgery;  Laterality: N/A;  . CORONARY STENT INTERVENTION N/A 03/16/2020   Procedure:  CORONARY STENT INTERVENTION;  Surgeon: Martinique, Peter M, MD;  Location: [place] [facility];  Service: Cardiovascular;  Laterality: N/A;  . CORONARY STENT PLACEMENT  03/10/2005   SUCCESSFUL STENTING OF THE MID AND CRUX OF THE RCA  . CORONARY/GRAFT ACUTE MI REVASCULARIZATION N/A 03/16/2020   Procedure: Coronary/Graft Acute MI Revascularization;  Surgeon: Martinique, Peter M, MD;  Location: [place] [facility];  Service: Cardiovascular;  Laterality: N/A;  . ESOPHAGEAL DILATION     multiple procedures  . HERNIA REPAIR  [id number]    umbilical hernia  . INGUINAL HERNIA REPAIR  11/28/2011   Procedure: HERNIA REPAIR INGUINAL ADULT;  Surgeon: Imogene Burn. Georgette Dover, MD;  Location: [place] [facility];  Service: General;  Laterality: Right;  right inguinal hernia repair with mesh  . KNEE SURGERY     Right  . LEFT HEART CATH AND CORONARY ANGIOGRAPHY N/A 12/03/2019   Procedure: LEFT HEART CATH AND CORONARY ANGIOGRAPHY;  Surgeon: Jettie Booze, MD;  Location: [place] [facility];  Service: Cardiovascular;  Laterality: N/A;  . LEFT HEART CATH AND CORS/GRAFTS ANGIOGRAPHY N/A 03/16/2020   Procedure: LEFT HEART CATH AND CORS/GRAFTS ANGIOGRAPHY;  Surgeon: Martinique, Peter M, MD;  Location: [place] [facility];  Service: Cardiovascular;  Laterality: N/A;  . POLYPECTOMY    . TEE WITHOUT CARDIOVERSION N/A 12/20/2019   Procedure: TRANSESOPHAGEAL ECHOCARDIOGRAM (TEE);  Surgeon: Prescott Gum, Collier Salina, MD;  Location: [place];  Service: Open Heart Surgery;  Laterality: N/A;  . TONSILLECTOMY  1958  . UMBILICAL HERNIA REPAIR  11/2006  . US ECHOCARDIOGRAPHY       Current Outpatient Medications  Medication Sig Dispense Refill  . acetaminophen (TYLENOL) 650 MG CR tablet Take 1,300 mg by mouth every 8 (eight) hours as needed for pain.    Marland Kitchen aspirin EC 81 MG tablet Take 1 tablet (81 mg total) by mouth daily.    . B Complex-C (B-COMPLEX WITH VITAMIN C) tablet Take 1 tablet by mouth daily with breakfast.     . cetirizine (ZYRTEC) 10 MG tablet Take 10 mg by mouth daily.      . clopidogrel (PLAVIX) 75 MG tablet Take 1 tablet (75 mg total) by mouth daily with breakfast. 30 tablet 11  . fluticasone (FLONASE) 50 MCG/ACT nasal spray Place 2 sprays into both nostrils daily as needed for allergies.     . isosorbide mononitrate (IMDUR) 30 MG 24 hr tablet Take 0.5 tablets (15 mg total) by mouth daily. 16 tablet 6  . lansoprazole (PREVACID) 15 MG capsule Take 15 mg by mouth daily.      Marland Kitchen lisinopril (ZESTRIL) 20 MG tablet Take 1 tablet (20 mg total) by mouth daily. 30 tablet 1  .  metFORMIN (GLUCOPHAGE) 500 MG tablet Take 1 tablet (500 mg total) by mouth 2 (two) times daily with a meal.    . metoprolol tartrate 37.5 MG TABS Take 37.5 mg by mouth 2 (two) times daily. 90 tablet 6  . Multiple Minerals-Vitamins (CALCIUM-MAGNESIUM-ZINC-D3) TABS Take 1 tablet by mouth daily with lunch.     . nitroGLYCERIN (NITROSTAT) 0.4 MG SL tablet Place 1 tablet (0.4 mg total) under the tongue every 5 (five) minutes as needed. 25 tablet 12  . OneTouch Delica Lancets 99991111 MISC 1 each by Other route daily.    Glory Rosebush VERIO test strip 1 each by Other route daily.    . Tamsulosin HCl (FLOMAX) 0.4 MG CAPS Take 0.4 mg by mouth daily after supper.      No current facility-administered medications  for this visit.    Allergies:   Statins, Bismuth subsalicylate, Codeine, Kiwi extract, Morphine and related, and Altace [ramipril]    Social History:  The patient  reports that he quit smoking about 5 years ago. His smoking use included cigarettes and pipe. He has a 22.00 pack-year smoking history. He has never used smokeless tobacco. He reports that he does not drink alcohol or use drugs.   Family History:  The patient's family history includes Cancer in his paternal grandmother; Heart disease in his brother, maternal grandfather, mother, and sister; Ovarian cancer in his paternal grandmother.    ROS:  Please see the history of present illness.   Otherwise, review of systems are positive for none.   All other systems are reviewed and negative.    PHYSICAL EXAM: VS:  BP 124/70   Pulse (!) 59   Ht 5\' 8"  (1.727 m)   Wt 158 lb 6.4 oz (71.8 kg)   SpO2 97%   BMI 24.08 kg/m  , BMI Body mass index is 24.08 kg/m. GEN: Well nourished, well developed, in no acute distress HEENT: normal Neck: no JVD, carotid bruits, or masses Cardiac: RRR; no murmurs, rubs, or gallops, no edema; +TTP of chest wall  Respiratory:  clear to auscultation bilaterally, normal work of breathing GI: soft, nontender,  nondistended, + BS MS: no deformity or atrophy Skin: warm and dry, no rash Neuro:  Strength and sensation are intact Psych: euthymic mood, full affect   EKG:  EKG is ordered today. The ekg ordered today demonstrates sinus bradycardia, rate 59bpm, LVH with non-specific IVCD, isolated TWI in III, no STE/D; no significant change from previous   Recent Labs: 12/21/2019: ALT 16; Magnesium 2.3 03/17/2020: BUN 16; Creatinine, Ser 1.30; Hemoglobin 11.9; Platelets 179; Potassium 3.9; Sodium 138    Lipid Panel    Component Value Date/Time   CHOL 205 (H) 03/16/2020 0646   TRIG 107 03/16/2020 0646   HDL 47 03/16/2020 0646   CHOLHDL 4.4 03/16/2020 0646   VLDL 21 03/16/2020 0646   LDLCALC 137 (H) 03/16/2020 0646      Wt Readings from Last 3 Encounters:  03/30/20 158 lb 6.4 oz (71.8 kg)  03/16/20 176 lb 5.9 oz (80 kg)  01/22/20 152 lb (68.9 kg)      Other studies Reviewed: Additional studies/ records that were reviewed today include:   Left heart catheterization 03/16/20:  Mid LAD-2 lesion is 100% stenosed.  Mid LAD-1 lesion is 80% stenosed.  Prox LAD lesion is 25% stenosed.  Dist Cx lesion is 90% stenosed.  A drug-eluting stent was successfully placed using a [place] [id number].  Post intervention, there is a 0% residual stenosis.  Prox RCA lesion is 65% stenosed.  RPAV lesion is 95% stenosed.  Mid RCA lesion is 30% stenosed.  Dist RCA lesion is 30% stenosed.  2nd Mrg-1 lesion is 99% stenosed.  A drug-eluting stent was successfully placed using a STENT RESOLUTE ONYX [id number].  Post intervention, there is a 0% residual stenosis.  2nd Mrg-2 lesion is 90% stenosed.  A drug-eluting stent was successfully placed using a [place] [id number].  Post intervention, there is a 0% residual stenosis.  LIMA graft was visualized by angiography and is normal in caliber.  The graft exhibits no disease.  SVG graft was visualized by angiography and is normal in  caliber.  The graft exhibits no disease.  SVG graft was visualized by angiography.  SVG graft was visualized by angiography.  Origin lesion  is 100% stenosed.  Origin to Prox Graft lesion is 100% stenosed.  LV end diastolic pressure is normal.  1. Severe 3 vessel obstructive CAD 2. Patent LIMA to the LAD 3. Patent SVG to the diagonal 4. Occluded SVG to OM2. The graft appeared to insert into a segment of severe disease 5. Occluded SVG to RCA possibly due to competitive filling from the native vessel 6. Successful PCI of OM2 with DES x 2 in proximal and distal vessel 7. Successful PCI of the distal LCx with DES x 1.  8. Normal LVEDP  Plan: DAPT for at least one year. Will DC Eliquis since he has had no Afib since DC from surgery. DC amiodarone. Intolerant to multiple cholesterol lowering drugs in the past. Will see lipid clinic as outpatient to get started on PCSK 9 inhibitor. I doubt the lesion in the proximal RCA is flow limiting so we will treat this medically for now. If he has recurrent angina we could consider PCI of this vessel. Patient may be a candidate for DC tomorrow if clinically stable.   Echocardiogram 12/2019: 1. Left ventricular ejection fraction, by visual estimation, is 55 to  60%. The left ventricle has normal function. There is no left ventricular  hypertrophy.  2. Left ventricular diastolic parameters are indeterminate.  3. The left ventricle has no regional wall motion abnormalities.  4. Global right ventricle has normal systolic function.The right  ventricular size is normal. No increase in right ventricular wall  thickness.  5. Left atrial size was mildly dilated.  6. Right atrial size was mildly dilated.  7. The mitral valve is normal in structure. No evidence of mitral valve  regurgitation. No evidence of mitral stenosis.  8. The tricuspid valve is normal in structure.  9. The aortic valve is normal in structure. Aortic valve regurgitation is   not visualized. No evidence of aortic valve sclerosis or stenosis.  10. The pulmonic valve was normal in structure. Pulmonic valve  regurgitation is not visualized.  11. The atrial septum is grossly normal.   ASSESSMENT AND PLAN:   1. Chest pain in patient with CAD s/p CABG 12/2019 with subsequent PCI/DES to OM2 x2 and LCx x1 03/16/20: Chest pain sounds MSK in nature. Suspect some costochondritis as he is tender to touch along sternum.  - Suggested taking tylenol for pain control. Will avoid NSAIDs to minimize bleeding risk with NSAIDs. - Continue aspirin and plavix - Continue metoprolol and imdur - Await lipid clinic appointment  2. HTN: BP 124/70 today - Coninue metoprolol and lisinopril  3. HLD: LDL 137 03/16/20. He is intolerant to statins - Await lipid clinic appointment 04/14/20 for PSK9-I consideration  4. DM type 2: A1C 7.2 - Continue metformin - Could consider addition of jardiance if Cr improved and A1C remains above goal. Will defer to PCP  5. Post-op atrial fibrillation: occurred in the post CABG setting and no evidence of recurrence. Eliquis and amiodarone discontinued during recent hospitalization  - Continue to monitor for recurrence.   Current medicines are reviewed at length with the patient today.  The patient does not have concerns regarding medicines.  The following changes have been made:  As above  Labs/ tests ordered today include:   Orders Placed This Encounter  Procedures  . EKG 12-Lead     Disposition:   FU with Dr. Martinique next week as previously scheduled.  Signed, Abigail Butts, PA-C  03/31/2020 11:36 PM

## 2020-03-30 ENCOUNTER — Ambulatory Visit: Payer: BC Managed Care – PPO | Admitting: Medical

## 2020-03-30 ENCOUNTER — Other Ambulatory Visit: Payer: Self-pay

## 2020-03-30 ENCOUNTER — Encounter: Payer: Self-pay | Admitting: Medical

## 2020-03-30 VITALS — BP 124/70 | HR 59 | Ht 68.0 in | Wt 158.4 lb

## 2020-03-30 DIAGNOSIS — R079 Chest pain, unspecified: Secondary | ICD-10-CM

## 2020-03-30 DIAGNOSIS — I480 Paroxysmal atrial fibrillation: Secondary | ICD-10-CM

## 2020-03-30 DIAGNOSIS — I10 Essential (primary) hypertension: Secondary | ICD-10-CM | POA: Diagnosis not present

## 2020-03-30 DIAGNOSIS — I25810 Atherosclerosis of coronary artery bypass graft(s) without angina pectoris: Secondary | ICD-10-CM

## 2020-03-30 DIAGNOSIS — E7800 Pure hypercholesterolemia, unspecified: Secondary | ICD-10-CM

## 2020-03-30 DIAGNOSIS — E119 Type 2 diabetes mellitus without complications: Secondary | ICD-10-CM

## 2020-03-30 NOTE — Patient Instructions (Addendum)
Medication Instructions:   TAKE Tylenol 650 mg 1-2 times a day for Chest pain *If you need a refill on your cardiac medications before your next appointment, please call your pharmacy*  Lab Work: NONE ordered at this time of appointment   If you have labs (blood work) drawn today and your tests are completely normal, you will receive your results only by: Marland Kitchen MyChart Message (if you have MyChart) OR . A paper copy in the mail If you have any lab test that is abnormal or we need to change your treatment, we will call you to review the results.  Testing/Procedures: NONE ordered at this time of appointment   Follow-Up: At [facility], you and your health needs are our priority.  As part of our continuing mission to provide you with exceptional heart care, we have created designated Provider Care Teams.  These Care Teams include your primary Cardiologist (physician) and Advanced Practice Providers (APPs -  Physician Assistants and Nurse Practitioners) who all work together to provide you with the care you need, when you need it.  We recommend signing up for the patient portal called "MyChart".  Sign up information is provided on this After Visit Summary.  MyChart is used to connect with patients for Virtual Visits (Telemedicine).  Patients are able to view lab/test results, encounter notes, upcoming appointments, etc.  Non-urgent messages can be sent to your provider as well.   To learn more about what you can do with MyChart, go to [email or web address].    Your next appointment:   As scheduled    The format for your next appointment:   In Person  Provider:   Peter Martinique, MD  Other Instructions

## 2020-04-02 NOTE — Progress Notes (Signed)
Cardiology Office Note   Date:  04/06/2020   ID:  [name], DOB [birth year], MRN [id number]  PCP:  Marton Redwood, MD  Cardiologist:  Vinnie Gombert Martinique, MD EP: None  Chief Complaint  Patient presents with  . Coronary Artery Disease      History of Present Illness: [name] is a 74 y.o. male with PMH of CAD s/p CABG 12/2019 with recent STEMI 03/16/20 with PCI/DES of OM2 x2 and PCI/DES of dLCx, post-op atrial fibrillation, HTN, HLD, and DM type 2, who is seen for follow up.   In January 2021 he presented with chest pain and had an abnormal stress test. Subsequent cardiac cath showed severe 3 vessel CAD. He underwent CABG by Dr Prescott Gum on 12/20/19 including LIMA to LAD, SVG to diagonal, SVG to OM1 and SVG to PDA. Post op course with uneventful.   He was admitted to [facility] from 03/16/20-03/17/20 after presenting with chest pain where he was found to have a STEMI. He was taken emergently to the cath lab which revealed occluded SVG to OM2 and RCA. Patent LIMA to LAD and SVG to diagonal. OM 2 has two separate high grade stenosis and he underwent successful PCI of OM2 with DES x 2 in proximal and distal vessel. Also underwent successful PCI of the distal LCx (90% stenosis) with DES x 1. He did have a 70% proximal RCA proximally and a diffusely diseased PLA branch which fills by left-to-right collaterals medically managed with consideration PCI to RCA if refractory angina. He was recommended for DAPT with aspirin and plavix. He was recommended to follow-up with the lipid clinic for PSK9-I consideration given prior intolerance to statins and LDL 137 03/16/20. His anticoagulation and amiodarone were discontinued given Afib history occurred following CABG without reoccurrence. He continued to have a few episodes of sharp chest pain lasting for seconds upon return home 03/17/20 and re-presented to the ED that evening. Again admitted 03/17/20-03/18/20 for observation. Home metoprolol was increased and  he was started on imdur at that time.   On follow up today he states he is feeling very well. Still has some parasternal pain with certain activities. Was trimming hedges yesterday. Usually pain is sharp and more noticeable when he first gets up in the am. Walking every other day. Some cramps in his calves.      Past Medical History:  Diagnosis Date  . Arthritis   . Arthritis   . Atrial fibrillation ([place])   . BPH (benign prostatic hypertrophy)   . CAD (coronary artery disease)    stents placed  . Diabetes mellitus   . Diverticulosis   . Dyspnea    when exerting self  . Dysrhythmia    "extra beats"  . Esophageal stricture   . GERD (gastroesophageal reflux disease)   . Headache(784.0)   . Hemorrhoids   . Hernia, inguinal, right   . Hiatal hernia   . Hyperlipidemia   . Hypertension   . Pneumonia   . Staph infection    Left ankle  . 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 Urinary hesitancy     Past Surgical History:  Procedure Laterality Date  . ANKLE ARTHROPLASTY  2006  . CARDIOVASCULAR STRESS TEST  01/22/2009   EF 51%, NO ISCHEMIA  . CARDIOVASCULAR STRESS TEST    . COLONOSCOPY    . CORONARY ANGIOPLASTY WITH STENT PLACEMENT  03/2005  . CORONARY ARTERY BYPASS GRAFT N/A 12/20/2019   Procedure: CORONARY ARTERY BYPASS GRAFTING (  CABG) times four, using left internal mammary artery and left greater saphenous vein;  Surgeon: Ivin Poot, MD;  Location: [place];  Service: Open Heart Surgery;  Laterality: N/A;  . CORONARY STENT INTERVENTION N/A 03/16/2020   Procedure: CORONARY STENT INTERVENTION;  Surgeon: Martinique, Travon Crochet M, MD;  Location: [place] [facility];  Service: Cardiovascular;  Laterality: N/A;  . CORONARY STENT PLACEMENT  03/10/2005   SUCCESSFUL STENTING OF THE MID AND CRUX OF THE RCA  . CORONARY/GRAFT ACUTE MI REVASCULARIZATION N/A 03/16/2020   Procedure: Coronary/Graft Acute MI Revascularization;  Surgeon: Martinique, Mcgwire Dasaro M, MD;  Location: [place] [facility];  Service:  Cardiovascular;  Laterality: N/A;  . ESOPHAGEAL DILATION     multiple procedures  . HERNIA REPAIR  [id number]   umbilical hernia  . INGUINAL HERNIA REPAIR  11/28/2011   Procedure: HERNIA REPAIR INGUINAL ADULT;  Surgeon: Imogene Burn. Georgette Dover, MD;  Location: [place] OR;  Service: General;  Laterality: Right;  right inguinal hernia repair with mesh  . KNEE SURGERY     Right  . LEFT HEART CATH AND CORONARY ANGIOGRAPHY N/A 12/03/2019   Procedure: LEFT HEART CATH AND CORONARY ANGIOGRAPHY;  Surgeon: Jettie Booze, MD;  Location: [place] CV LAB;  Service: Cardiovascular;  Laterality: N/A;  . LEFT HEART CATH AND CORS/GRAFTS ANGIOGRAPHY N/A 03/16/2020   Procedure: LEFT HEART CATH AND CORS/GRAFTS ANGIOGRAPHY;  Surgeon: Martinique, Mataio Mele M, MD;  Location: [place] CV LAB;  Service: Cardiovascular;  Laterality: N/A;  . POLYPECTOMY    . TEE WITHOUT CARDIOVERSION N/A 12/20/2019   Procedure: TRANSESOPHAGEAL ECHOCARDIOGRAM (TEE);  Surgeon: Prescott Gum, Collier Salina, MD;  Location: [place];  Service: Open Heart Surgery;  Laterality: N/A;  . TONSILLECTOMY  1958  . UMBILICAL HERNIA REPAIR  11/2006  . US ECHOCARDIOGRAPHY       Current Outpatient Medications  Medication Sig Dispense Refill  . acetaminophen (TYLENOL) 650 MG CR tablet Take 1,300 mg by mouth every 8 (eight) hours as needed for pain.    Marland Kitchen aspirin EC 81 MG tablet Take 1 tablet (81 mg total) by mouth daily.    . B Complex-C (B-COMPLEX WITH VITAMIN C) tablet Take 1 tablet by mouth daily with breakfast.     . cetirizine (ZYRTEC) 10 MG tablet Take 10 mg by mouth daily.      . clopidogrel (PLAVIX) 75 MG tablet Take 1 tablet (75 mg total) by mouth daily with breakfast. 30 tablet 11  . fluticasone (FLONASE) 50 MCG/ACT nasal spray Place 2 sprays into both nostrils daily as needed for allergies.     . isosorbide mononitrate (IMDUR) 30 MG 24 hr tablet Take 0.5 tablets (15 mg total) by mouth daily. 16 tablet 6  . lansoprazole (PREVACID) 15 MG capsule Take 15 mg by  mouth daily.      Marland Kitchen lisinopril (ZESTRIL) 20 MG tablet Take 1 tablet (20 mg total) by mouth daily. 30 tablet 1  . metFORMIN (GLUCOPHAGE) 500 MG tablet Take 1 tablet (500 mg total) by mouth 2 (two) times daily with a meal.    . metoprolol tartrate 37.5 MG TABS Take 37.5 mg by mouth 2 (two) times daily. 90 tablet 6  . Multiple Minerals-Vitamins (CALCIUM-MAGNESIUM-ZINC-D3) TABS Take 1 tablet by mouth daily with lunch.     . nitroGLYCERIN (NITROSTAT) 0.4 MG SL tablet Place 1 tablet (0.4 mg total) under the tongue every 5 (five) minutes as needed. 25 tablet 12  . OneTouch Delica Lancets 99991111 MISC 1 each by Other route daily.    Marland Kitchen  ONETOUCH VERIO test strip 1 each by Other route daily.    . Tamsulosin HCl (FLOMAX) 0.4 MG CAPS Take 0.4 mg by mouth daily after supper.      No current facility-administered medications for this visit.    Allergies:   Statins, Bismuth subsalicylate, Codeine, Kiwi extract, Morphine and related, and Altace [ramipril]    Social History:  The patient  reports that he quit smoking about 5 years ago. His smoking use included cigarettes and pipe. He has a 22.00 pack-year smoking history. He has never used smokeless tobacco. He reports that he does not drink alcohol or use drugs.   Family History:  The patient's family history includes Cancer in his paternal grandmother; Heart disease in his brother, maternal grandfather, mother, and sister; Ovarian cancer in his paternal grandmother.    ROS:  Please see the history of present illness.   Otherwise, review of systems are positive for none.   All other systems are reviewed and negative.    PHYSICAL EXAM: VS:  BP 134/62   Pulse 61   Ht 5\' 9"  (1.753 m)   Wt 162 lb 6.4 oz (73.7 kg)   SpO2 100%   BMI 23.98 kg/m  , BMI Body mass index is 23.98 kg/m. GEN: Well nourished, well developed, in no acute distress HEENT: normal Neck: no JVD, carotid bruits, or masses Cardiac: RRR; no murmurs, rubs, or gallops, no edema; +point  tenderness to palpation left upper sternal margin Respiratory:  clear to auscultation bilaterally, normal work of breathing GI: soft, nontender, nondistended, + BS MS: no deformity or atrophy Skin: warm and dry, no rash Neuro:  Strength and sensation are intact Psych: euthymic mood, full affect   EKG:  EKG is not ordered today.    Recent Labs: 12/21/2019: ALT 16; Magnesium 2.3 03/17/2020: BUN 16; Creatinine, Ser 1.30; Hemoglobin 11.9; Platelets 179; Potassium 3.9; Sodium 138    Lipid Panel    Component Value Date/Time   CHOL 205 (H) 03/16/2020 0646   TRIG 107 03/16/2020 0646   HDL 47 03/16/2020 0646   CHOLHDL 4.4 03/16/2020 0646   VLDL 21 03/16/2020 0646   LDLCALC 137 (H) 03/16/2020 0646      Wt Readings from Last 3 Encounters:  04/06/20 162 lb 6.4 oz (73.7 kg)  03/30/20 158 lb 6.4 oz (71.8 kg)  03/16/20 176 lb 5.9 oz (80 kg)      Other studies Reviewed: Additional studies/ records that were reviewed today include:   Left heart catheterization 03/16/20:  Mid LAD-2 lesion is 100% stenosed.  Mid LAD-1 lesion is 80% stenosed.  Prox LAD lesion is 25% stenosed.  Dist Cx lesion is 90% stenosed.  A drug-eluting stent was successfully placed using a [place] [id number].  Post intervention, there is a 0% residual stenosis.  Prox RCA lesion is 65% stenosed.  RPAV lesion is 95% stenosed.  Mid RCA lesion is 30% stenosed.  Dist RCA lesion is 30% stenosed.  2nd Mrg-1 lesion is 99% stenosed.  A drug-eluting stent was successfully placed using a STENT RESOLUTE ONYX 2.5X18.  Post intervention, there is a 0% residual stenosis.  2nd Mrg-2 lesion is 90% stenosed.  A drug-eluting stent was successfully placed using a [place] [id number].  Post intervention, there is a 0% residual stenosis.  LIMA graft was visualized by angiography and is normal in caliber.  The graft exhibits no disease.  SVG graft was visualized by angiography and is normal in  caliber.  The graft exhibits  no disease.  SVG graft was visualized by angiography.  SVG graft was visualized by angiography.  Origin lesion is 100% stenosed.  Origin to Prox Graft lesion is 100% stenosed.  LV end diastolic pressure is normal.  1. Severe 3 vessel obstructive CAD 2. Patent LIMA to the LAD 3. Patent SVG to the diagonal 4. Occluded SVG to OM2. The graft appeared to insert into a segment of severe disease 5. Occluded SVG to RCA possibly due to competitive filling from the native vessel 6. Successful PCI of OM2 with DES x 2 in proximal and distal vessel 7. Successful PCI of the distal LCx with DES x 1.  8. Normal LVEDP  Plan: DAPT for at least one year. Will DC Eliquis since he has had no Afib since DC from surgery. DC amiodarone. Intolerant to multiple cholesterol lowering drugs in the past. Will see lipid clinic as outpatient to get started on PCSK 9 inhibitor. I doubt the lesion in the proximal RCA is flow limiting so we will treat this medically for now. If he has recurrent angina we could consider PCI of this vessel. Patient may be a candidate for DC tomorrow if clinically stable.   Echocardiogram 12/2019: 1. Left ventricular ejection fraction, by visual estimation, is 55 to  60%. The left ventricle has normal function. There is no left ventricular  hypertrophy.  2. Left ventricular diastolic parameters are indeterminate.  3. The left ventricle has no regional wall motion abnormalities.  4. Global right ventricle has normal systolic function.The right  ventricular size is normal. No increase in right ventricular wall  thickness.  5. Left atrial size was mildly dilated.  6. Right atrial size was mildly dilated.  7. The mitral valve is normal in structure. No evidence of mitral valve  regurgitation. No evidence of mitral stenosis.  8. The tricuspid valve is normal in structure.  9. The aortic valve is normal in structure. Aortic valve regurgitation is    not visualized. No evidence of aortic valve sclerosis or stenosis.  10. The pulmonic valve was normal in structure. Pulmonic valve  regurgitation is not visualized.  11. The atrial septum is grossly normal.   ASSESSMENT AND PLAN:   1. CAD s/p CABG 12/2019 with subsequent PCI/DES to OM2 x2 and LCx x1 03/16/20 with cardiac cath showing occlusion of SVG to OM1 and SVG to PDA:  - he still has some musculoskeletal pain. No significant angina - Continue aspirin and plavix for one year - Continue metoprolol and imdur - Await lipid clinic appointment  2. HTN: BP is well controlled. - Coninue metoprolol and lisinopril  3. HLD: LDL 137 03/16/20. He is intolerant to statins - Await lipid clinic appointment 04/14/20 for PSK9-I consideration  4. DM type 2: A1C 7.2 - Continue metformin - has follow up with Dr Brigitte Pulse next week. History of hypoglycemia on glipizide. May want to consider Jardiance or Farxiga.   5. Post-op atrial fibrillation: occurred in the post CABG setting and no evidence of recurrence.  - Continue to monitor for recurrence.   Current medicines are reviewed at length with the patient today.  The patient does not have concerns regarding medicines.  The following changes have been made:  As above  Labs/ tests ordered today include:   No orders of the defined types were placed in this encounter.    Disposition:   FU with Dr. Martinique 3 months scheduled.  Signed, Hillary Struss Martinique, MD  04/06/2020 8:50 AM

## 2020-04-03 ENCOUNTER — Ambulatory Visit: Payer: BC Managed Care – PPO | Attending: Internal Medicine

## 2020-04-03 DIAGNOSIS — Z23 Encounter for immunization: Secondary | ICD-10-CM

## 2020-04-03 NOTE — Progress Notes (Signed)
   Covid-19 Vaccination Clinic  Name:  [name]    MRN: [id number] DOB: 1946/03/11  04/03/2020  [name] was observed post Covid-19 immunization for 15 minutes without incident. He was provided with Vaccine Information Sheet and instruction to access the V-Safe system.   [name] was instructed to call 911 with any severe reactions post vaccine: Marland Kitchen Difficulty breathing  . Swelling of face and throat  . A fast heartbeat  . A bad rash all over body  . Dizziness and weakness   Immunizations Administered    Name Date Dose VIS Date Route   Pfizer COVID-19 Vaccine 04/03/2020 10:47 AM 0.3 mL 01/29/2019 Intramuscular   Manufacturer: [place]   Lot: [id number]   [place]: [id number]

## 2020-04-06 ENCOUNTER — Ambulatory Visit: Payer: BC Managed Care – PPO | Admitting: Cardiology

## 2020-04-06 ENCOUNTER — Other Ambulatory Visit: Payer: Self-pay

## 2020-04-06 ENCOUNTER — Encounter: Payer: Self-pay | Admitting: Cardiology

## 2020-04-06 VITALS — BP 134/62 | HR 61 | Ht 69.0 in | Wt 162.4 lb

## 2020-04-06 DIAGNOSIS — I25810 Atherosclerosis of coronary artery bypass graft(s) without angina pectoris: Secondary | ICD-10-CM

## 2020-04-06 DIAGNOSIS — Z951 Presence of aortocoronary bypass graft: Secondary | ICD-10-CM | POA: Diagnosis not present

## 2020-04-06 DIAGNOSIS — E119 Type 2 diabetes mellitus without complications: Secondary | ICD-10-CM

## 2020-04-06 DIAGNOSIS — I10 Essential (primary) hypertension: Secondary | ICD-10-CM

## 2020-04-06 DIAGNOSIS — E7800 Pure hypercholesterolemia, unspecified: Secondary | ICD-10-CM

## 2020-04-06 NOTE — Patient Instructions (Addendum)
Increase your walking daily

## 2020-04-08 ENCOUNTER — Other Ambulatory Visit: Payer: Self-pay

## 2020-04-08 ENCOUNTER — Encounter: Payer: Self-pay | Admitting: Cardiothoracic Surgery

## 2020-04-08 ENCOUNTER — Other Ambulatory Visit: Payer: Self-pay | Admitting: *Deleted

## 2020-04-08 ENCOUNTER — Ambulatory Visit: Payer: BC Managed Care – PPO | Admitting: Cardiothoracic Surgery

## 2020-04-08 DIAGNOSIS — Z951 Presence of aortocoronary bypass graft: Secondary | ICD-10-CM

## 2020-04-08 DIAGNOSIS — I2510 Atherosclerotic heart disease of native coronary artery without angina pectoris: Secondary | ICD-10-CM

## 2020-04-08 DIAGNOSIS — Z4889 Encounter for other specified surgical aftercare: Secondary | ICD-10-CM | POA: Diagnosis not present

## 2020-04-08 NOTE — Progress Notes (Signed)
amb  

## 2020-04-08 NOTE — Progress Notes (Signed)
PCP is Marton Redwood, MD Referring Provider is Martinique, Peter M, MD  Chief Complaint  Patient presents with  . Routine Post Op    s/p CABG x 4 on 12/20/19    HPI: Follow up office visit after multivessel CABG early January 2021.  He was readmitted with recurrent chest pain 3 months later found to have a vein graft to an OM branch occluded which was treated with PCI.  He is now home with PCI to the OM1 and patent left IMA to LAD and patent saphenous vein graft to diagonal.  The RCA has minimal disease. The patient is having episodes of sharp neuropathic pain close to the sternal incision. Overall he is making progress and is walking a mile and a half daily. Surgical incisions appear to be well-healed. He is compliant with his medications and diet. He wishes to be referred back to the outpatient cardiac rehab program at [facility].   Past Medical History:  Diagnosis Date  . Arthritis   . Arthritis   . Atrial fibrillation ([place])   . BPH (benign prostatic hypertrophy)   . CAD (coronary artery disease)    stents placed  . Diabetes mellitus   . Diverticulosis   . Dyspnea    when exerting self  . Dysrhythmia    "extra beats"  . Esophageal stricture   . GERD (gastroesophageal reflux disease)   . Headache(784.0)   . Hemorrhoids   . Hernia, inguinal, right   . Hiatal hernia   . Hyperlipidemia   . Hypertension   . Pneumonia   . Staph infection    Left ankle  . 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 Urinary hesitancy     Past Surgical History:  Procedure Laterality Date  . ANKLE ARTHROPLASTY  2006  . CARDIOVASCULAR STRESS TEST  01/22/2009   EF 51%, NO ISCHEMIA  . CARDIOVASCULAR STRESS TEST    . COLONOSCOPY    . CORONARY ANGIOPLASTY WITH STENT PLACEMENT  03/2005  . CORONARY ARTERY BYPASS GRAFT N/A 12/20/2019   Procedure: CORONARY ARTERY BYPASS GRAFTING (CABG) times four, using left internal mammary artery and left greater saphenous vein;  Surgeon: Ivin Poot, MD;   Location: [place];  Service: Open Heart Surgery;  Laterality: N/A;  . CORONARY STENT INTERVENTION N/A 03/16/2020   Procedure: CORONARY STENT INTERVENTION;  Surgeon: Martinique, Peter M, MD;  Location: [place] CV LAB;  Service: Cardiovascular;  Laterality: N/A;  . CORONARY STENT PLACEMENT  03/10/2005   SUCCESSFUL STENTING OF THE MID AND CRUX OF THE RCA  . CORONARY/GRAFT ACUTE MI REVASCULARIZATION N/A 03/16/2020   Procedure: Coronary/Graft Acute MI Revascularization;  Surgeon: Martinique, Peter M, MD;  Location: [place] CV LAB;  Service: Cardiovascular;  Laterality: N/A;  . ESOPHAGEAL DILATION     multiple procedures  . HERNIA REPAIR  [id number]   umbilical hernia  . INGUINAL HERNIA REPAIR  11/28/2011   Procedure: HERNIA REPAIR INGUINAL ADULT;  Surgeon: Imogene Burn. Georgette Dover, MD;  Location: [place] OR;  Service: General;  Laterality: Right;  right inguinal hernia repair with mesh  . KNEE SURGERY     Right  . LEFT HEART CATH AND CORONARY ANGIOGRAPHY N/A 12/03/2019   Procedure: LEFT HEART CATH AND CORONARY ANGIOGRAPHY;  Surgeon: Jettie Booze, MD;  Location: [place] CV LAB;  Service: Cardiovascular;  Laterality: N/A;  . LEFT HEART CATH AND CORS/GRAFTS ANGIOGRAPHY N/A 03/16/2020   Procedure: LEFT HEART CATH AND CORS/GRAFTS ANGIOGRAPHY;  Surgeon: Martinique, Peter M, MD;  Location:  [place] INVASIVE CV LAB;  Service: Cardiovascular;  Laterality: N/A;  . POLYPECTOMY    . TEE WITHOUT CARDIOVERSION N/A 12/20/2019   Procedure: TRANSESOPHAGEAL ECHOCARDIOGRAM (TEE);  Surgeon: Prescott Gum, Collier Salina, MD;  Location: [place];  Service: Open Heart Surgery;  Laterality: N/A;  . TONSILLECTOMY  1958  . UMBILICAL HERNIA REPAIR  11/2006  . US ECHOCARDIOGRAPHY      Family History  Problem Relation Age of Onset  . Heart disease Mother   . Heart disease Sister   . Heart disease Brother   . Heart disease Maternal Grandfather   . Ovarian cancer Paternal Grandmother   . Cancer Paternal Grandmother        cervical  . Colon cancer  Neg Hx   . Esophageal cancer Neg Hx   . Stomach cancer Neg Hx     Social History Social History   Tobacco Use  . Smoking status: Former Smoker    Packs/day: 1.00    Years: 22.00    Pack years: 22.00    Types: Cigarettes, Pipe    Quit date: 12/17/2014    Years since quitting: 5.3  . Smokeless tobacco: Never Used  Substance Use Topics  . Alcohol use: No    Alcohol/week: 0.0 standard drinks  . Drug use: No    Current Outpatient Medications  Medication Sig Dispense Refill  . acetaminophen (TYLENOL) 650 MG CR tablet Take 1,300 mg by mouth every 8 (eight) hours as needed for pain.    Marland Kitchen aspirin EC 81 MG tablet Take 1 tablet (81 mg total) by mouth daily.    . B Complex-C (B-COMPLEX WITH VITAMIN C) tablet Take 1 tablet by mouth daily with breakfast.     . cetirizine (ZYRTEC) 10 MG tablet Take 10 mg by mouth daily.      . clopidogrel (PLAVIX) 75 MG tablet Take 1 tablet (75 mg total) by mouth daily with breakfast. 30 tablet 11  . fluticasone (FLONASE) 50 MCG/ACT nasal spray Place 2 sprays into both nostrils daily as needed for allergies.     . isosorbide mononitrate (IMDUR) 30 MG 24 hr tablet Take 0.5 tablets (15 mg total) by mouth daily. 16 tablet 6  . lansoprazole (PREVACID) 15 MG capsule Take 15 mg by mouth daily.      Marland Kitchen lisinopril (ZESTRIL) 20 MG tablet Take 1 tablet (20 mg total) by mouth daily. 30 tablet 1  . metFORMIN (GLUCOPHAGE) 500 MG tablet Take 1 tablet (500 mg total) by mouth 2 (two) times daily with a meal.    . metoprolol tartrate 37.5 MG TABS Take 37.5 mg by mouth 2 (two) times daily. 90 tablet 6  . Multiple Minerals-Vitamins (CALCIUM-MAGNESIUM-ZINC-D3) TABS Take 1 tablet by mouth daily with lunch.     . nitroGLYCERIN (NITROSTAT) 0.4 MG SL tablet Place 1 tablet (0.4 mg total) under the tongue every 5 (five) minutes as needed. 25 tablet 12  . OneTouch Delica Lancets 99991111 MISC 1 each by Other route daily.    Glory Rosebush VERIO test strip 1 each by Other route daily.    .  Tamsulosin HCl (FLOMAX) 0.4 MG CAPS Take 0.4 mg by mouth daily after supper.      No current facility-administered medications for this visit.    Allergies  Allergen Reactions  . Statins Anaphylaxis    Myalgias   . Bismuth Subsalicylate Nausea And Vomiting  . Codeine Nausea And Vomiting  . Kiwi Extract     Makes face red, break out into bumps on  body.   . Morphine And Related Nausea And Vomiting  . Altace [Ramipril] Rash    Review of Systems   Chest wall discomfort usually early in the morning sharp and localized Appetite and weight are stable  BP 112/65 (BP Location: Right Arm, Patient Position: Sitting, Cuff Size: Normal)   Pulse 71   Temp 98.1 F (36.7 C) (Temporal)   Resp 20   Ht 5\' 10"  (1.778 m)   Wt 162 lb 8 oz (73.7 kg)   SpO2 96% Comment: RA  BMI 23.32 kg/m  Physical Exam      Exam    General- alert and comfortable    Neck- no JVD, no cervical adenopathy palpable, no carotid bruit   Lungs- clear without rales, wheezes   Cor- regular rate and rhythm, no murmur , gallop   Abdomen- soft, non-tender   Extremities - warm, non-tender, minimal edema   Neuro- oriented, appropriate, no focal weakness   Diagnostic Tests: None  Impression: Doing well after PCI to OM2 branch with patent left IMA and patent vein graft to diagonal.  Plan: I will facilitate referral to the [place] regional phase 2 cardiac rehab program.  We will see the patient back later this summer to follow-up his progress.  We discussed the importance of heart healthy lifestyle, aerobic activity and diet.  He will be seeing a pharmacist regarding lipid control in the near future.  He understands importance of good diabetic control.  Len Childs, MD [facility] [phone]

## 2020-04-14 ENCOUNTER — Other Ambulatory Visit: Payer: Self-pay

## 2020-04-14 ENCOUNTER — Ambulatory Visit (INDEPENDENT_AMBULATORY_CARE_PROVIDER_SITE_OTHER): Payer: BC Managed Care – PPO | Admitting: Pharmacist Clinician (PhC)/ Clinical Pharmacy Specialist

## 2020-04-14 VITALS — Temp 98.2°F | Ht 70.0 in | Wt 162.0 lb

## 2020-04-14 DIAGNOSIS — G720 Drug-induced myopathy: Secondary | ICD-10-CM

## 2020-04-14 DIAGNOSIS — E7800 Pure hypercholesterolemia, unspecified: Secondary | ICD-10-CM

## 2020-04-14 DIAGNOSIS — T466X5A Adverse effect of antihyperlipidemic and antiarteriosclerotic drugs, initial encounter: Secondary | ICD-10-CM

## 2020-04-14 DIAGNOSIS — E782 Mixed hyperlipidemia: Secondary | ICD-10-CM | POA: Diagnosis not present

## 2020-04-14 NOTE — Progress Notes (Signed)
04/15/2020 [name] 04/08/1946 [id number]   HPI:  [name] is a 74 y.o. male patient of Dr Martinique, who presents today for a lipid clinic evaluation.  See pertinent past medical history below.  He is in the office today with his wife, as he admits to poor memory recall since having CABG in January.  Wife notes that she doesn't let him drive anywhere alone, as she has concerns for him getting lost or forgetting where is going.  She answers most of the questions today.   He also notes some ongoing sternal pain, stating they believe it to be nerve damage from the bypass surgery.    Patient has tried several different statin drugs (see below), all causing myalgias.  He was prescribed Repatha by his PCP several years ago, back when it was still $14,000/yr and the price thru insurance was cost prohibitive.   He makes it clear that he does not like the idea of needles, but neither does he want to take any more daily pills.    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  Mother with CABG died in her 75's, father died at 76 with tuberculosis sister with irregular heartbeat; 2 children, no known cardiac issues (daughter had colon cancer at 40, DM  Diet: mostly home cooked, eats out on occasion, enjoys fried foods when eats out; home is pan fried  Not deep fried; some vegetables; snacks on protein bars, pork rinds, peanut butter, hard candies  Exercise:  Calling for cardiac rehab; does not sit still working in yard, walks most days   Labs: 4/21:  TC  205, TG 107, HDL 47, LDL 137  1/21:  TC 234, TG 182, HDL 49, LDL 149   Current Outpatient Medications  Medication Sig Dispense Refill  . acetaminophen (TYLENOL) 650 MG CR tablet Take 1,300 mg by mouth every 8 (eight) hours as needed for pain.    Marland Kitchen aspirin EC 81 MG tablet Take 1 tablet (81 mg total) by mouth daily.    . B Complex-C (B-COMPLEX WITH VITAMIN C) tablet Take 1 tablet by mouth daily with breakfast.     . cetirizine (ZYRTEC) 10 MG tablet Take 10 mg by mouth daily.      . clopidogrel (PLAVIX) 75 MG tablet Take 1 tablet (75 mg total) by mouth daily with breakfast. 30 tablet 11  . fluticasone (FLONASE) 50 MCG/ACT nasal spray Place 2 sprays into both nostrils daily as needed for allergies.     . isosorbide mononitrate (IMDUR) 30 MG 24 hr tablet Take 0.5 tablets (15 mg total) by mouth daily. 16 tablet 6  . lansoprazole (PREVACID) 15 MG capsule Take 15 mg by mouth daily.      Marland Kitchen lisinopril (ZESTRIL) 20 MG tablet Take 1 tablet (20 mg total) by mouth daily. 30 tablet 1  . metFORMIN (GLUCOPHAGE) 500 MG tablet Take 1 tablet (500 mg total) by mouth 2 (two) times daily with a meal.    . metoprolol tartrate 37.5 MG TABS Take 37.5 mg by mouth 2 (two) times daily.  90 tablet 6  . Multiple Minerals-Vitamins (CALCIUM-MAGNESIUM-ZINC-D3) TABS Take 1 tablet by mouth daily with lunch.     . nitroGLYCERIN (NITROSTAT) 0.4 MG SL tablet Place 1 tablet (0.4 mg total) under the tongue every 5 (five) minutes as needed. 25 tablet 12  . OneTouch Delica Lancets 99991111 MISC 1 each by Other route daily.    Glory Rosebush VERIO test strip 1 each by Other route daily.    . Tamsulosin HCl (FLOMAX) 0.4 MG CAPS Take 0.4 mg by mouth daily after supper.      No current facility-administered medications for this visit.    Allergies  Allergen Reactions  . Statins Anaphylaxis    Myalgias   . Bismuth Subsalicylate Nausea And Vomiting  . Codeine Nausea And Vomiting  . Kiwi Extract     Makes face red, break out into bumps on  body.   . Morphine And Related Nausea And Vomiting  . Altace [Ramipril] Rash    Past Medical History:  Diagnosis Date  . Arthritis   . Arthritis   . Atrial fibrillation ([place])   . BPH (benign prostatic hypertrophy)   . CAD (coronary artery disease)    stents placed  . Diabetes mellitus   . Diverticulosis   . Dyspnea    when exerting self  . Dysrhythmia    "extra beats"  . Esophageal stricture   . GERD (gastroesophageal reflux disease)   . Headache(784.0)   . Hemorrhoids   . Hernia, inguinal, right   . Hiatal hernia   . Hyperlipidemia   . Hypertension   . Pneumonia   . Staph infection    Left ankle  . 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 Urinary hesitancy     Temperature 98.2 F (36.8 C), height 5\' 10"  (1.778 m), weight 162 lb (73.5 kg).   Hyperlipidemia Patient with mixed hyperlipidemia, not able to tolerate statin drugs.  Reviewed options for lowering LDL cholesterol, including ezetimibe, PCSK-9 inhibitors and bempedoic acid.  Discussed mechanisms of action, dosing, side effects and potential decreases in LDL cholesterol.  Answered all patient questions.  Based on this information, patient would prefer to start [place] Pushtronix.  He has concerns about self injection, but feels that the monthly option would be doable.  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  We will start the paperwork to get this covered for him.  He and his wife understand that we will call them once approved and that if he needs assistance with the first dose, he is welcome to call the office for assistance.  We will have him repeat labs after the third infusion.      Tommy Medal PharmD CPP [facility] [address] [place], [place] [address] [phone]

## 2020-04-14 NOTE — Patient Instructions (Signed)
Your Results:             Your most recent labs Goal  Total Cholesterol 234 < 200  Triglycerides 182 < 150  HDL (happy/good cholesterol) 49 > 40  LDL (lousy/bad cholesterol 149 < 70    Medication changes:  We will start the process to get Repatha Pushtronix approved.  You will need to use one Pushtronix device every month.    Lab orders:  After 3 infusions you will need to repeat the cholesterol labs.    Patient Assistance:  The [employer] foundation offers assistance to help pay for medication copays.  They will cover copays for all cholesterol lowering meds, including statins, fibrates, omega-3 oils, ezetimibe, Repatha, Praluent, Nexletol, Nexlizet.  The cards are usually good for $2,500 or 12 months, whichever comes first. 1. Go to healthwellfoundation.org 2. Click on "Apply Now" 3. Answer questions as to whom is applying (patient or representative) 4. Your disease fund will be "hypercholesterolemia - Medicare access" 5. They will ask questions about finances and which medications you are taking for cholesterol 6. When you submit, the approval is usually within minutes.  You will need to print the card information from the site 7. You will need to show this information to your pharmacy, they will bill your Medicare Part D plan first -then bill [employer] --for the copay.   You can also call them at [phone], although the hold times can be quite long.   Thank you for choosing [facility]

## 2020-04-15 ENCOUNTER — Encounter: Payer: Self-pay | Admitting: Pharmacist Clinician (PhC)/ Clinical Pharmacy Specialist

## 2020-04-15 NOTE — Assessment & Plan Note (Signed)
Patient with mixed hyperlipidemia, not able to tolerate statin drugs.  Reviewed options for lowering LDL cholesterol, including ezetimibe, PCSK-9 inhibitors and bempedoic acid.  Discussed mechanisms of action, dosing, side effects and potential decreases in LDL cholesterol.  Answered all patient questions.  Based on this information, patient would prefer to start [place] Pushtronix.  He has concerns about self injection, but feels that the monthly option would be doable.  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  We will start the paperwork to get this covered for him.  He and his wife understand that we will call them once approved and that if he needs assistance with the first dose, he is welcome to call the office for assistance.  We will have him repeat labs after the third infusion.

## 2020-04-20 ENCOUNTER — Telehealth: Payer: Self-pay

## 2020-04-20 MED ORDER — PRALUENT 150 MG/ML ~~LOC~~ SOAJ: 150.0000 mg | SUBCUTANEOUS | 11 refills | Status: DC

## 2020-04-20 NOTE — Telephone Encounter (Signed)
Called ([facility]) and instructed he pt to start praluent 150mg , rx sent, and pt instructed to callback if unaffordable

## 2020-04-24 ENCOUNTER — Other Ambulatory Visit: Payer: Self-pay

## 2020-04-24 ENCOUNTER — Encounter: Payer: BC Managed Care – PPO | Attending: Cardiology | Admitting: *Deleted

## 2020-04-24 DIAGNOSIS — Z955 Presence of coronary angioplasty implant and graft: Secondary | ICD-10-CM

## 2020-04-24 DIAGNOSIS — I213 ST elevation (STEMI) myocardial infarction of unspecified site: Secondary | ICD-10-CM

## 2020-04-24 NOTE — Progress Notes (Signed)
Initial telephone orientation completed. Diagnosis can be found in [facility] 4/12. EP orientation scheduled for 5/27 at 11am

## 2020-04-28 ENCOUNTER — Ambulatory Visit: Payer: BC Managed Care – PPO | Attending: Internal Medicine

## 2020-04-28 DIAGNOSIS — Z23 Encounter for immunization: Secondary | ICD-10-CM

## 2020-04-28 NOTE — Progress Notes (Signed)
   Covid-19 Vaccination Clinic  Name:  OMKAR EBEL    MRN: [id number] DOB: 21-Mar-1946  04/28/2020  Mr. Brookins was observed post Covid-19 immunization for 15 minutes without incident. He was provided with Vaccine Information Sheet and instruction to access the V-Safe system.   Mr. Watson was instructed to call 911 with any severe reactions post vaccine: Marland Kitchen Difficulty breathing  . Swelling of face and throat  . A fast heartbeat  . A bad rash all over body  . Dizziness and weakness   Immunizations Administered    Name Date Dose VIS Date Route   Pfizer COVID-19 Vaccine 04/28/2020  2:08 PM 0.3 mL 01/29/2019 Intramuscular   Manufacturer: [employer], [employer]   Lot: [id number]   [place]: [id number]

## 2020-05-07 ENCOUNTER — Encounter: Payer: BC Managed Care – PPO | Attending: Cardiology | Admitting: *Deleted

## 2020-05-07 ENCOUNTER — Other Ambulatory Visit: Payer: Self-pay

## 2020-05-07 VITALS — Ht 68.8 in | Wt 156.9 lb

## 2020-05-07 DIAGNOSIS — Z955 Presence of coronary angioplasty implant and graft: Secondary | ICD-10-CM | POA: Diagnosis not present

## 2020-05-07 DIAGNOSIS — I213 ST elevation (STEMI) myocardial infarction of unspecified site: Secondary | ICD-10-CM | POA: Insufficient documentation

## 2020-05-07 DIAGNOSIS — Z951 Presence of aortocoronary bypass graft: Secondary | ICD-10-CM

## 2020-05-07 NOTE — Patient Instructions (Signed)
Patient Instructions  Patient Details  Name: [name] MRN: [id number] Date of Birth: 04/26/1946 Referring Provider:  Martinique, Peter M, MD  Below are your personal goals for exercise, nutrition, and risk factors. Our goal is to help you stay on track towards obtaining and maintaining these goals. We will be discussing your progress on these goals with you throughout the program.  Initial Exercise Prescription: Initial Exercise Prescription - 05/07/20 1100      Date of Initial Exercise RX and Referring Provider   Date  05/07/20    Referring Provider  Martinique, Peter MD      Treadmill   MPH  2.3    Grade  0.5    Minutes  15    METs  2.92      Recumbant Bike   Level  2    RPM  50    Watts  19    Minutes  15    METs  2      NuStep   Level  1    SPM  80    Minutes  15    METs  2      Arm Ergometer   Level  2    Watts  30    RPM  25    Minutes  15    METs  2      REL-XR   Level  1    Speed  50    Minutes  15    METs  2      T5 Nustep   Level  1    SPM  80    Minutes  15    METs  2      Prescription Details   Frequency (times per week)  2    Duration  Progress to 30 minutes of continuous aerobic without signs/symptoms of physical distress      Intensity   THRR 40-80% of Max Heartrate  93-128    Ratings of Perceived Exertion  11-13    Perceived Dyspnea  0-4      Progression   Progression  Continue to progress workloads to maintain intensity without signs/symptoms of physical distress.      Resistance Training   Training Prescription  Yes    Weight  3 lb    Reps  10-15       Exercise Goals: Frequency: Be able to perform aerobic exercise two to three times per week in program working toward 2-5 days per week of home exercise.  Intensity: Work with a perceived exertion of 11 (fairly light) - 15 (hard) while following your exercise prescription.  We will make changes to your prescription with you as you progress through the program.   Duration: Be  able to do 30 to 45 minutes of continuous aerobic exercise in addition to a 5 minute warm-up and a 5 minute cool-down routine.   Nutrition Goals: Your personal nutrition goals will be established when you do your nutrition analysis with the dietician.  The following are general nutrition guidelines to follow: Cholesterol < 200mg /day Sodium < 1500mg /day Fiber: Men over 50 yrs - 30 grams per day  Personal Goals: Personal Goals and Risk Factors at Admission - 05/07/20 1115      Core Components/Risk Factors/Patient Goals on Admission    Weight Management  Yes;Weight Maintenance    Intervention  Weight Management: Develop a combined nutrition and exercise program designed to reach desired caloric intake, while maintaining appropriate intake of nutrient  and fiber, sodium and fats, and appropriate energy expenditure required for the weight goal.;Weight Management: Provide education and appropriate resources to help participant work on and attain dietary goals.    Admit Weight  156 lb 14.4 oz (71.2 kg)    Goal Weight: Short Term  156 lb (70.8 kg)    Goal Weight: Long Term  156 lb (70.8 kg)    Expected Outcomes  Short Term: Continue to assess and modify interventions until short term weight is achieved;Long Term: Adherence to nutrition and physical activity/exercise program aimed toward attainment of established weight goal;Weight Maintenance: Understanding of the daily nutrition guidelines, which includes 25-35% calories from fat, 7% or less cal from saturated fats, less than 200mg  cholesterol, less than 1.5gm of sodium, & 5 or more servings of fruits and vegetables daily    Diabetes  Yes    Intervention  Provide education about signs/symptoms and action to take for hypo/hyperglycemia.;Provide education about proper nutrition, including hydration, and aerobic/resistive exercise prescription along with prescribed medications to achieve blood glucose in normal ranges: Fasting glucose 65-99 mg/dL     Expected Outcomes  Short Term: Participant verbalizes understanding of the signs/symptoms and immediate care of hyper/hypoglycemia, proper foot care and importance of medication, aerobic/resistive exercise and nutrition plan for blood glucose control.;Long Term: Attainment of HbA1C < 7%.    Hypertension  Yes    Intervention  Provide education on lifestyle modifcations including regular physical activity/exercise, weight management, moderate sodium restriction and increased consumption of fresh fruit, vegetables, and low fat dairy, alcohol moderation, and smoking cessation.;Monitor prescription use compliance.    Expected Outcomes  Short Term: Continued assessment and intervention until BP is < 140/35mm HG in hypertensive participants. < 130/51mm HG in hypertensive participants with diabetes, heart failure or chronic kidney disease.;Long Term: Maintenance of blood pressure at goal levels.    Lipids  Yes    Intervention  Provide education and support for participant on nutrition & aerobic/resistive exercise along with prescribed medications to achieve LDL 70mg , HDL >40mg .    Expected Outcomes  Short Term: Participant states understanding of desired cholesterol values and is compliant with medications prescribed. Participant is following exercise prescription and nutrition guidelines.;Long Term: Cholesterol controlled with medications as prescribed, with individualized exercise RX and with personalized nutrition plan. Value goals: LDL < 70mg , HDL > 40 mg.       Tobacco Use Initial Evaluation: Social History   Tobacco Use  Smoking Status Former Smoker  . Packs/day: 1.00  . Years: 22.00  . Pack years: 22.00  . Types: Cigarettes, Pipe  . Quit date: 12/17/2014  . Years since quitting: 5.3  Smokeless Tobacco Never Used    Exercise Goals and Review: Exercise Goals    Row Name 05/07/20 1114             Exercise Goals   Increase Physical Activity  Yes       Intervention  Provide advice,  education, support and counseling about physical activity/exercise needs.;Develop an individualized exercise prescription for aerobic and resistive training based on initial evaluation findings, risk stratification, comorbidities and participant's personal goals.       Expected Outcomes  Short Term: Attend rehab on a regular basis to increase amount of physical activity.;Long Term: Add in home exercise to make exercise part of routine and to increase amount of physical activity.;Long Term: Exercising regularly at least 3-5 days a week.       Increase Strength and Stamina  Yes  Intervention  Provide advice, education, support and counseling about physical activity/exercise needs.;Develop an individualized exercise prescription for aerobic and resistive training based on initial evaluation findings, risk stratification, comorbidities and participant's personal goals.       Expected Outcomes  Short Term: Increase workloads from initial exercise prescription for resistance, speed, and METs.;Long Term: Improve cardiorespiratory fitness, muscular endurance and strength as measured by increased METs and functional capacity (6MWT);Short Term: Perform resistance training exercises routinely during rehab and add in resistance training at home       Able to understand and use rate of perceived exertion (RPE) scale  Yes       Intervention  Provide education and explanation on how to use RPE scale       Expected Outcomes  Short Term: Able to use RPE daily in rehab to express subjective intensity level;Long Term:  Able to use RPE to guide intensity level when exercising independently       Able to understand and use Dyspnea scale  Yes       Intervention  Provide education and explanation on how to use Dyspnea scale       Expected Outcomes  Short Term: Able to use Dyspnea scale daily in rehab to express subjective sense of shortness of breath during exertion;Long Term: Able to use Dyspnea scale to guide intensity  level when exercising independently       Knowledge and understanding of Target Heart Rate Range (THRR)  Yes       Intervention  Provide education and explanation of THRR including how the numbers were predicted and where they are located for reference       Expected Outcomes  Short Term: Able to state/look up THRR;Short Term: Able to use daily as guideline for intensity in rehab;Long Term: Able to use THRR to govern intensity when exercising independently       Able to check pulse independently  Yes       Intervention  Provide education and demonstration on how to check pulse in carotid and radial arteries.;Review the importance of being able to check your own pulse for safety during independent exercise       Expected Outcomes  Short Term: Able to explain why pulse checking is important during independent exercise;Long Term: Able to check pulse independently and accurately       Understanding of Exercise Prescription  Yes       Intervention  Provide education, explanation, and written materials on patient's individual exercise prescription       Expected Outcomes  Short Term: Able to explain program exercise prescription;Long Term: Able to explain home exercise prescription to exercise independently          Copy of goals given to participant.

## 2020-05-07 NOTE — Progress Notes (Signed)
Cardiac Individual Treatment Plan  Patient Details  Name: [name] MRN: [id number] Date of Birth: October 12, 1946 Referring Provider:     Cardiac Rehab from 05/07/2020 in [facility] Cardiac and Pulmonary Rehab  Referring Provider  Martinique, Peter MD      Initial Encounter Date:    Cardiac Rehab from 05/07/2020 in [facility] Cardiac and Pulmonary Rehab  Date  05/07/20      Visit Diagnosis: ST elevation myocardial infarction (STEMI), unspecified artery ([place])  Status post coronary artery stent placement  S/P CABG x 4  Patient's Home Medications on Admission:  Current Outpatient Medications:    acetaminophen (TYLENOL) 650 MG CR tablet, Take 1,300 mg by mouth every 8 (eight) hours as needed for pain., Disp: , Rfl:    Alirocumab (PRALUENT) 150 MG/ML SOAJ, Inject 150 mg into the skin every 14 (fourteen) days., Disp: 2 pen, Rfl: 11   aspirin EC 81 MG tablet, Take 1 tablet (81 mg total) by mouth daily., Disp:  , Rfl:    B Complex-C (B-COMPLEX WITH VITAMIN C) tablet, Take 1 tablet by mouth daily with breakfast. , Disp: , Rfl:    cetirizine (ZYRTEC) 10 MG tablet, Take 10 mg by mouth daily.  , Disp: , Rfl:    clopidogrel (PLAVIX) 75 MG tablet, Take 1 tablet (75 mg total) by mouth daily with breakfast., Disp: 30 tablet, Rfl: 11   empagliflozin (JARDIANCE) 10 MG TABS tablet, Take 10 mg by mouth daily., Disp: , Rfl:    fluticasone (FLONASE) 50 MCG/ACT nasal spray, Place 2 sprays into both nostrils daily as needed for allergies. , Disp: , Rfl:    isosorbide mononitrate (IMDUR) 30 MG 24 hr tablet, Take 0.5 tablets (15 mg total) by mouth daily., Disp: 16 tablet, Rfl: 6   lansoprazole (PREVACID) 15 MG capsule, Take 15 mg by mouth daily.  , Disp: , Rfl:    lisinopril (ZESTRIL) 20 MG tablet, Take 1 tablet (20 mg total) by mouth daily., Disp: 30 tablet, Rfl: 1   metFORMIN (GLUCOPHAGE) 500 MG tablet, Take 1 tablet (500 mg total) by mouth 2 (two) times daily with a meal. (Patient not taking: Reported on  04/24/2020), Disp:  , Rfl:    metoprolol tartrate 37.5 MG TABS, Take 37.5 mg by mouth 2 (two) times daily., Disp: 90 tablet, Rfl: 6   Multiple Minerals-Vitamins (CALCIUM-MAGNESIUM-ZINC-D3) TABS, Take 1 tablet by mouth daily with lunch. , Disp: , Rfl:    nitroGLYCERIN (NITROSTAT) 0.4 MG SL tablet, Place 1 tablet (0.4 mg total) under the tongue every 5 (five) minutes as needed., Disp: 25 tablet, Rfl: 12   OneTouch Delica Lancets 85Y MISC, 1 each by Other route daily., Disp: , Rfl:    ONETOUCH VERIO test strip, 1 each by Other route daily., Disp: , Rfl:    Tamsulosin HCl (FLOMAX) 0.4 MG CAPS, Take 0.4 mg by mouth daily after supper. 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   Urinary hesitancy     Tobacco Use: Social History   Tobacco Use  Smoking Status Former Smoker   Packs/day: 1.00   Years: 22.00   Pack years: 22.00   Types: Cigarettes, Pipe   Quit date: 12/17/2014   Years since quitting: 5.3  Smokeless Tobacco Never Used    Labs: Recent Review Scientist, physiological    Labs for ITP Cardiac and Pulmonary Rehab Latest Ref Rng & Units 12/20/2019 12/22/2019 12/23/2019 03/16/2020 03/17/2020   Cholestrol 0 - 200 mg/dL - - - 205(H) -   LDLCALC 0 - 99 mg/dL - - - 137(H) -   HDL >40 mg/dL - - - 47 -   Trlycerides <150 mg/dL - - - 107 -   Hemoglobin A1c 4.8 - 5.6 % - - - 7.2(H) 7.2(H)   PHART 7.350 - 7.450 7.409 - -  - -   PCO2ART 32.0 - 48.0 mmHg 39.5 - - - -   HCO3 20.0 - 28.0 mmol/L 24.7 - - - -   TCO2 22 - 32 mmol/L 26 - - 26 -   ACIDBASEDEF 0.0 - 2.0 mmol/L - - - - -   O2SAT % 98.0 54.1 72.7 - -       Exercise Target Goals: Exercise Program Goal: Individual exercise prescription set using results from initial 6 min walk test and THRR while considering  patients activity barriers and safety.   Exercise Prescription Goal: Initial exercise prescription builds to 30-45 minutes a day of aerobic activity, 2-3 days per week.  Home exercise guidelines will be given to patient during program as part of exercise prescription that the participant will acknowledge.   Education: Aerobic Exercise & Resistance Training: - Gives group verbal and written instruction on the various components of exercise. Focuses on aerobic and resistive training programs and the benefits of this training and how to safely progress through these programs..   Education: Exercise & Equipment Safety: - Individual verbal instruction and demonstration of equipment use and safety with use of the equipment.   Cardiac Rehab from 05/07/2020 in [facility] Cardiac and Pulmonary Rehab  Date  05/07/20  Educator  [facility]  Instruction Review Code  1- Verbalizes Understanding      Education: Exercise Physiology & General Exercise Guidelines: - Group verbal and written instruction with models to review the exercise physiology of the cardiovascular system and associated critical values. Provides general exercise guidelines with specific guidelines to those with heart or lung disease.    Education: Flexibility, Balance, Mind/Body Relaxation: Provides group verbal/written instruction on the benefits of flexibility and balance training, including mind/body exercise modes such as yoga, pilates and tai chi.  Demonstration and skill practice provided.   Activity Barriers & Risk Stratification: Activity Barriers & Cardiac Risk Stratification - 04/24/20  1403      Activity Barriers & Cardiac Risk Stratification   Activity Barriers  Back Problems;Arthritis   hx knee surgery   Cardiac Risk Stratification  High       6 Minute Walk: 6 Minute Walk    Row Name 05/07/20 1053         6 Minute Walk   Phase  Initial     Distance  1230 feet     Walk Time  6 minutes     # of Rest Breaks  0     MPH  2.33  METS  2.83     RPE  7     VO2 Peak  9.91     Symptoms  No     Resting HR  58 bpm     Resting BP  114/72     Resting Oxygen Saturation   97 %     Exercise Oxygen Saturation  during 6 min walk  97 %     Max Ex. HR  84 bpm     Max Ex. BP  156/66     2 Minute Post BP  104/58        Oxygen Initial Assessment:   Oxygen Re-Evaluation:   Oxygen Discharge (Final Oxygen Re-Evaluation):   Initial Exercise Prescription: Initial Exercise Prescription - 05/07/20 1100      Date of Initial Exercise RX and Referring Provider   Date  05/07/20    Referring Provider  Martinique, Peter MD      Treadmill   MPH  2.3    Grade  0.5    Minutes  15    METs  2.92      Recumbant Bike   Level  2    RPM  50    Watts  19    Minutes  15    METs  2      NuStep   Level  1    SPM  80    Minutes  15    METs  2      Arm Ergometer   Level  2    Watts  30    RPM  25    Minutes  15    METs  2      REL-XR   Level  1    Speed  50    Minutes  15    METs  2      T5 Nustep   Level  1    SPM  80    Minutes  15    METs  2      Prescription Details   Frequency (times per week)  2    Duration  Progress to 30 minutes of continuous aerobic without signs/symptoms of physical distress      Intensity   THRR 40-80% of Max Heartrate  93-128    Ratings of Perceived Exertion  11-13    Perceived Dyspnea  0-4      Progression   Progression  Continue to progress workloads to maintain intensity without signs/symptoms of physical distress.      Resistance Training   Training Prescription  Yes    Weight  3 lb    Reps  10-15        Perform Capillary Blood Glucose checks as needed.  Exercise Prescription Changes: Exercise Prescription Changes    Row Name 05/07/20 1100             Response to Exercise   Blood Pressure (Admit)  114/72       Blood Pressure (Exercise)  152/66       Blood Pressure (Exit)  104/58       Heart Rate (Admit)  58 bpm       Heart Rate (Exercise)  84 bpm       Heart Rate (Exit)  59 bpm       Oxygen Saturation (Admit)  97 %       Oxygen Saturation (Exercise)  97 %       Rating of Perceived Exertion (Exercise)  7       Symptoms  none       Comments  walk test results          Exercise Comments:   Exercise Goals and Review: Exercise Goals    Row Name 05/07/20 1114             Exercise Goals   Increase Physical Activity  Yes       Intervention  Provide advice, education, support and counseling about physical activity/exercise needs.;Develop an individualized exercise prescription for aerobic and resistive training based on initial evaluation findings, risk stratification, comorbidities and participant's personal goals.       Expected Outcomes  Short Term: Attend rehab on a regular basis to increase amount of physical activity.;Long Term: Add in home exercise to make exercise part of routine and to increase amount of physical activity.;Long Term: Exercising regularly at least 3-5 days a week.       Increase Strength and Stamina  Yes       Intervention  Provide advice, education, support and counseling about physical activity/exercise needs.;Develop an individualized exercise prescription for aerobic and resistive training based on initial evaluation findings, risk stratification, comorbidities and participant's personal goals.       Expected Outcomes  Short Term: Increase workloads from initial exercise prescription for resistance, speed, and METs.;Long Term: Improve cardiorespiratory fitness, muscular endurance and strength as measured by increased METs and functional capacity  (6MWT);Short Term: Perform resistance training exercises routinely during rehab and add in resistance training at home       Able to understand and use rate of perceived exertion (RPE) scale  Yes       Intervention  Provide education and explanation on how to use RPE scale       Expected Outcomes  Short Term: Able to use RPE daily in rehab to express subjective intensity level;Long Term:  Able to use RPE to guide intensity level when exercising independently       Able to understand and use Dyspnea scale  Yes       Intervention  Provide education and explanation on how to use Dyspnea scale       Expected Outcomes  Short Term: Able to use Dyspnea scale daily in rehab to express subjective sense of shortness of breath during exertion;Long Term: Able to use Dyspnea scale to guide intensity level when exercising independently       Knowledge and understanding of Target Heart Rate Range (THRR)  Yes       Intervention  Provide education and explanation of THRR including how the numbers were predicted and where they are located for reference       Expected Outcomes  Short Term: Able to state/look up THRR;Short Term: Able to use daily as guideline for intensity in rehab;Long Term: Able to use THRR to govern intensity when exercising independently       Able to check pulse independently  Yes       Intervention  Provide education and demonstration on how to check pulse in carotid and radial arteries.;Review the importance of being able to check your own pulse for safety during independent exercise       Expected Outcomes  Short Term: Able to explain why pulse checking is important during independent exercise;Long Term: Able to check pulse independently and accurately       Understanding of Exercise Prescription  Yes       Intervention  Provide education, explanation, and written materials on  patient's individual exercise prescription       Expected Outcomes  Short Term: Able to explain program exercise  prescription;Long Term: Able to explain home exercise prescription to exercise independently          Exercise Goals Re-Evaluation :   Discharge Exercise Prescription (Final Exercise Prescription Changes): Exercise Prescription Changes - 05/07/20 1100      Response to Exercise   Blood Pressure (Admit)  114/72    Blood Pressure (Exercise)  152/66    Blood Pressure (Exit)  104/58    Heart Rate (Admit)  58 bpm    Heart Rate (Exercise)  84 bpm    Heart Rate (Exit)  59 bpm    Oxygen Saturation (Admit)  97 %    Oxygen Saturation (Exercise)  97 %    Rating of Perceived Exertion (Exercise)  7    Symptoms  none    Comments  walk test results       Nutrition:  Target Goals: Understanding of nutrition guidelines, daily intake of sodium <1532m, cholesterol <2030m calories 30% from fat and 7% or less from saturated fats, daily to have 5 or more servings of fruits and vegetables.  Education: Controlling Sodium/Reading Food Labels -Group verbal and written material supporting the discussion of sodium use in heart healthy nutrition. Review and explanation with models, verbal and written materials for utilization of the food label.   Education: General Nutrition Guidelines/Fats and Fiber: -Group instruction provided by verbal, written material, models and posters to present the general guidelines for heart healthy nutrition. Gives an explanation and review of dietary fats and fiber.   Biometrics: Pre Biometrics - 05/07/20 1115      Pre Biometrics   Height  5' 8.8" (1.748 m)    Weight  156 lb 14.4 oz (71.2 kg)    BMI (Calculated)  23.29    Single Leg Stand  5 seconds        Nutrition Therapy Plan and Nutrition Goals:   Nutrition Assessments:   MEDIFICTS Score Key:          ?70 Need to make dietary changes          40-70 Heart Healthy Diet         ? 40 Therapeutic Level Cholesterol Diet  Nutrition Goals Re-Evaluation:   Nutrition Goals Discharge (Final Nutrition Goals  Re-Evaluation):   Psychosocial: Target Goals: Acknowledge presence or absence of significant depression and/or stress, maximize coping skills, provide positive support system. Participant is able to verbalize types and ability to use techniques and skills needed for reducing stress and depression.   Education: Depression - Provides group verbal and written instruction on the correlation between heart/lung disease and depressed mood, treatment options, and the stigmas associated with seeking treatment.   Education: Sleep Hygiene -Provides group verbal and written instruction about how sleep can affect your health.  Define sleep hygiene, discuss sleep cycles and impact of sleep habits. Review good sleep hygiene tips.     Education: Stress and Anxiety: - Provides group verbal and written instruction about the health risks of elevated stress and causes of high stress.  Discuss the correlation between heart/lung disease and anxiety and treatment options. Review healthy ways to manage with stress and anxiety.    Initial Review & Psychosocial Screening: Initial Psych Review & Screening - 04/24/20 1407      Initial Review   Current issues with  Current Stress Concerns    Source of Stress Concerns  Chronic Illness  Family Dynamics   Good Support System?  Yes      Barriers   Psychosocial barriers to participate in program  There are no identifiable barriers or psychosocial needs.;The patient should benefit from training in stress management and relaxation.      Screening Interventions   Interventions  Encouraged to exercise;To provide support and resources with identified psychosocial needs;Provide feedback about the scores to participant    Expected Outcomes  Short Term goal: Utilizing psychosocial counselor, staff and physician to assist with identification of specific Stressors or current issues interfering with healing process. Setting desired goal for each stressor or current issue  identified.;Long Term Goal: Stressors or current issues are controlled or eliminated.;Short Term goal: Identification and review with participant of any Quality of Life or Depression concerns found by scoring the questionnaire.;Long Term goal: The participant improves quality of Life and PHQ9 Scores as seen by post scores and/or verbalization of changes       Quality of Life Scores:   Scores of 19 and below usually indicate a poorer quality of life in these areas.  A difference of  2-3 points is a clinically meaningful difference.  A difference of 2-3 points in the total score of the Quality of Life Index has been associated with significant improvement in overall quality of life, self-image, physical symptoms, and general health in studies assessing change in quality of life.  PHQ-9: Recent Review Flowsheet Data    There is no flowsheet data to display.     Interpretation of Total Score  Total Score Depression Severity:  1-4 = Minimal depression, 5-9 = Mild depression, 10-14 = Moderate depression, 15-19 = Moderately severe depression, 20-27 = Severe depression   Psychosocial Evaluation and Intervention: Psychosocial Evaluation - 04/24/20 1417      Psychosocial Evaluation & Interventions   Comments  Fuad has 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 He and his wife both state that his memory has not been good since the CABG, so she has to help him remember a lot more. He says he doesn't feel too stressed out and that he sleeps very well. He and his wife look forward to him starting cardiac rehab for educaion and exercise.    Expected Outcomes  Short: attend [facility] for exercise and education. Long: develop positive self care habits.    Continue Psychosocial Services   Follow up required by staff       Psychosocial Re-Evaluation:   Psychosocial Discharge (Final Psychosocial Re-Evaluation):   Vocational Rehabilitation: Provide vocational rehab assistance to qualifying  candidates.   Vocational Rehab Evaluation & Intervention: Vocational Rehab - 04/24/20 1407      Initial Vocational Rehab Evaluation & Intervention   Assessment shows need for Vocational Rehabilitation  No       Education: Education Goals: Education classes will be provided on a variety of topics geared toward better understanding of heart health and risk factor modification. Participant will state understanding/return demonstration of topics presented as noted by education test scores.  Learning Barriers/Preferences: Learning Barriers/Preferences - 04/24/20 1408      Learning Barriers/Preferences   Learning Barriers  None    Learning Preferences  None       General Cardiac Education Topics:  AED/CPR: - Group verbal and written instruction with the use of models to demonstrate the basic use of the AED with the basic ABC's of resuscitation.   Anatomy & Physiology of the Heart: - Group verbal and written instruction and models  provide basic cardiac anatomy and physiology, with the coronary electrical and arterial systems. Review of Valvular disease and Heart Failure   Cardiac Procedures: - Group verbal and written instruction to review commonly prescribed medications for heart disease. Reviews the medication, class of the drug, and side effects. Includes the steps to properly store meds and maintain the prescription regimen. (beta blockers and nitrates)   Cardiac Medications I: - Group verbal and written instruction to review commonly prescribed medications for heart disease. Reviews the medication, class of the drug, and side effects. Includes the steps to properly store meds and maintain the prescription regimen.   Cardiac Medications II: -Group verbal and written instruction to review commonly prescribed medications for heart disease. Reviews the medication, class of the drug, and side effects. (all other drug classes)    Go Sex-Intimacy & Heart Disease, Get SMART - Goal  Setting: - Group verbal and written instruction through game format to discuss heart disease and the return to sexual intimacy. Provides group verbal and written material to discuss and apply goal setting through the application of the S.M.A.R.T. Method.   Other Matters of the Heart: - Provides group verbal, written materials and models to describe Stable Angina and Peripheral Artery. Includes description of the disease process and treatment options available to the cardiac patient.   Infection Prevention: - Provides verbal and written material to individual with discussion of infection control including proper hand washing and proper equipment cleaning during exercise session.   Cardiac Rehab from 05/07/2020 in [facility] Cardiac and Pulmonary Rehab  Date  05/07/20  Educator  [facility]  Instruction Review Code  1- Verbalizes Understanding      Falls Prevention: - Provides verbal and written material to individual with discussion of falls prevention and safety.   Cardiac Rehab from 05/07/2020 in [facility] Cardiac and Pulmonary Rehab  Date  05/07/20  Educator  [facility]  Instruction Review Code  1- Verbalizes Understanding      Other: -Provides group and verbal instruction on various topics (see comments)   Knowledge Questionnaire Score:   Core Components/Risk Factors/Patient Goals at Admission: Personal Goals and Risk Factors at Admission - 05/07/20 1115      Core Components/Risk Factors/Patient Goals on Admission    Weight Management  Yes;Weight Maintenance    Intervention  Weight Management: Develop a combined nutrition and exercise program designed to reach desired caloric intake, while maintaining appropriate intake of nutrient and fiber, sodium and fats, and appropriate energy expenditure required for the weight goal.;Weight Management: Provide education and appropriate resources to help participant work on and attain dietary goals.    Admit Weight  156 lb 14.4 oz (71.2 kg)    Goal Weight: Short Term   156 lb (70.8 kg)    Goal Weight: Long Term  156 lb (70.8 kg)    Expected Outcomes  Short Term: Continue to assess and modify interventions until short term weight is achieved;Long Term: Adherence to nutrition and physical activity/exercise program aimed toward attainment of established weight goal;Weight Maintenance: Understanding of the daily nutrition guidelines, which includes 25-35% calories from fat, 7% or less cal from saturated fats, less than 222m cholesterol, less than 1.5gm of sodium, & 5 or more servings of fruits and vegetables daily    Diabetes  Yes    Intervention  Provide education about signs/symptoms and action to take for hypo/hyperglycemia.;Provide education about proper nutrition, including hydration, and aerobic/resistive exercise prescription along with prescribed medications to achieve blood glucose in normal ranges: Fasting glucose 65-99  mg/dL    Expected Outcomes  Short Term: Participant verbalizes understanding of the signs/symptoms and immediate care of hyper/hypoglycemia, proper foot care and importance of medication, aerobic/resistive exercise and nutrition plan for blood glucose control.;Long Term: Attainment of HbA1C < 7%.    Hypertension  Yes    Intervention  Provide education on lifestyle modifcations including regular physical activity/exercise, weight management, moderate sodium restriction and increased consumption of fresh fruit, vegetables, and low fat dairy, alcohol moderation, and smoking cessation.;Monitor prescription use compliance.    Expected Outcomes  Short Term: Continued assessment and intervention until BP is < 140/73m HG in hypertensive participants. < 130/851mHG in hypertensive participants with diabetes, heart failure or chronic kidney disease.;Long Term: Maintenance of blood pressure at goal levels.    Lipids  Yes    Intervention  Provide education and support for participant on nutrition & aerobic/resistive exercise along with prescribed medications  to achieve LDL <7014mHDL >81m37m  Expected Outcomes  Short Term: Participant states understanding of desired cholesterol values and is compliant with medications prescribed. Participant is following exercise prescription and nutrition guidelines.;Long Term: Cholesterol controlled with medications as prescribed, with individualized exercise RX and with personalized nutrition plan. Value goals: LDL < 70mg66mL > 40 mg.       Education:Diabetes - Individual verbal and written instruction to review signs/symptoms of diabetes, desired ranges of glucose level fasting, after meals and with exercise. Acknowledge that pre and post exercise glucose checks will be done for 3 sessions at entry of program.   [facility]  Date  04/24/20  [facility]  1- [employer]standing      Education: Know Your Numbers and Risk Factors: -Group verbal and written instruction about important numbers in your health.  Discussion of what are risk factors and how they play a role in the disease process.  Review of Cholesterol, Blood Pressure, Diabetes, and BMI and the role they play in your overall health.   Core Components/Risk Factors/Patient Goals Review:    Core Components/Risk Factors/Patient Goals at Discharge (Final Review):    ITP Comments: ITP Comments    Row Name 04/24/20 1416 05/07/20 1053         ITP Comments  Initial telephone orientation completed. Diagnosis can be found in [facility]. EP orientation scheduled for 5/27 at 11am  Completed 6MWT and gym orientation.  Initial ITP created and sent for review to Dr. Mark Emily Filbertical Director.         Comments: Initial ITP

## 2020-05-11 ENCOUNTER — Other Ambulatory Visit: Payer: Self-pay

## 2020-05-11 DIAGNOSIS — I213 ST elevation (STEMI) myocardial infarction of unspecified site: Secondary | ICD-10-CM | POA: Diagnosis not present

## 2020-05-11 NOTE — Progress Notes (Signed)
Daily Session Note  Patient Details  Name: [name] MRN: [id number] Date of Birth: Nov 27, 1946 Referring Provider:     Cardiac Rehab from 05/07/2020 in [facility] Cardiac and Pulmonary Rehab  Referring Provider  Martinique, Peter MD      Encounter Date: 05/11/2020  Check In: Session Check In - 05/11/20 0954      Check-In   Supervising physician immediately available to respond to emergencies  See telemetry face sheet for immediately available ER MD    Location  [facility]-Cardiac & Pulmonary Rehab    Staff Present  Heath Lark, RN, BSN, Laveda Norman, BS, ACSM CEP, Exercise Physiologist;Verneda Hollopeter Eliezer Bottom, MS Exercise Physiologist;Joseph Tessie Fass RCP,RRT,BSRT    Virtual Visit  No    Medication changes reported      No    Fall or balance concerns reported     No    Warm-up and Cool-down  Performed on first and last piece of equipment    Resistance Training Performed  Yes    VAD Patient?  No    PAD/SET Patient?  No      Pain Assessment   Currently in Pain?  No/denies          Social History   Tobacco Use  Smoking Status Former Smoker  . Packs/day: 1.00  . Years: 22.00  . Pack years: 22.00  . Types: Cigarettes, Pipe  . Quit date: 12/17/2014  . Years since quitting: 5.4  Smokeless Tobacco Never Used    Goals Met:  Strength training completed today  Goals Unmet:  Not Applicable  Comments: First full day of exercise!  Patient was oriented to gym and equipment including functions, settings, policies, and procedures.  Patient's individual exercise prescription and treatment plan were reviewed.  All starting workloads were established based on the results of the 6 minute walk test done at initial orientation visit.  The plan for exercise progression was also introduced and progression will be customized based on patient's performance and goals.   Dr. Emily Filbert is Medical Director for [place] and [facility].

## 2020-05-13 ENCOUNTER — Encounter: Payer: BC Managed Care – PPO | Admitting: *Deleted

## 2020-05-13 ENCOUNTER — Other Ambulatory Visit: Payer: Self-pay

## 2020-05-13 DIAGNOSIS — I213 ST elevation (STEMI) myocardial infarction of unspecified site: Secondary | ICD-10-CM

## 2020-05-13 DIAGNOSIS — Z955 Presence of coronary angioplasty implant and graft: Secondary | ICD-10-CM

## 2020-05-13 LAB — GLUCOSE, CAPILLARY: Glucose-Capillary: 163 mg/dL — ABNORMAL HIGH (ref 70–99)

## 2020-05-13 NOTE — Progress Notes (Signed)
Daily Session Note  Patient Details  Name: [name] MRN: [id number] Date of Birth: 1946-05-26 Referring Provider:     Cardiac Rehab from 05/07/2020 in [facility] Cardiac [facility] Rehab  Referring Provider  Martinique, Peter MD      Encounter Date: 05/13/2020  Check In: Session Check In - 05/13/20 0935      Check-In   Supervising physician immediately available to respond to emergencies  See telemetry face sheet for immediately available ER MD    Location  [facility]-Cardiac & Pulmonary Rehab    Staff Present  Heath Lark, RN, BSN, CCRP;Melissa Caiola RDN, LDN;Joseph Hood RCP,RRT,BSRT    Virtual Visit  No    Medication changes reported      No    Fall or balance concerns reported     No    Warm-up and Cool-down  Performed on first and last piece of equipment    Resistance Training Performed  Yes    VAD Patient?  No    PAD/SET Patient?  No      Pain Assessment   Currently in Pain?  No/denies          Social History   Tobacco Use  Smoking Status Former Smoker  . Packs/day: 1.00  . Years: 22.00  . Pack years: 22.00  . Types: Cigarettes, Pipe  . Quit date: 12/17/2014  . Years since quitting: 5.4  Smokeless Tobacco Never Used    Goals Met:  Independence with exercise equipment Exercise tolerated well No report of cardiac concerns or symptoms  Goals Unmet:  Not Applicable  Comments: Pt able to follow exercise prescription today without complaint.  Will continue to monitor for progression.    Dr. Emily Filbert is Medical Director for [place] and [facility].

## 2020-05-18 ENCOUNTER — Other Ambulatory Visit: Payer: Self-pay

## 2020-05-18 ENCOUNTER — Encounter: Payer: BC Managed Care – PPO | Admitting: *Deleted

## 2020-05-18 DIAGNOSIS — I213 ST elevation (STEMI) myocardial infarction of unspecified site: Secondary | ICD-10-CM | POA: Diagnosis not present

## 2020-05-18 DIAGNOSIS — Z955 Presence of coronary angioplasty implant and graft: Secondary | ICD-10-CM

## 2020-05-18 LAB — GLUCOSE, CAPILLARY
Glucose-Capillary: 104 mg/dL — ABNORMAL HIGH (ref 70–99)
Glucose-Capillary: 133 mg/dL — ABNORMAL HIGH (ref 70–99)

## 2020-05-18 NOTE — Progress Notes (Signed)
Daily Session Note  Patient Details  Name: [name] MRN: [id number] Date of Birth: 05-20-1946 Referring Provider:     Cardiac Rehab from 05/07/2020 in [facility] Cardiac and Pulmonary Rehab  Referring Provider Martinique, Peter MD      Encounter Date: 05/18/2020  Check In:  Session Check In - 05/18/20 0820      Check-In   Supervising physician immediately available to respond to emergencies See telemetry face sheet for immediately available ER MD    Location [facility]-Cardiac & Pulmonary Rehab    Staff Present Justin Mend RCP,RRT,BSRT;Heath Lark, RN, BSN, Laveda Norman, BS, ACSM CEP, Exercise Physiologist    Virtual Visit No    Medication changes reported     No    Fall or balance concerns reported    No    Warm-up and Cool-down Performed on first and last piece of equipment    Resistance Training Performed Yes    VAD Patient? No    PAD/SET Patient? No      Pain Assessment   Currently in Pain? No/denies              Social History   Tobacco Use  Smoking Status Former Smoker   Packs/day: 1.00   Years: 22.00   Pack years: 22.00   Types: Cigarettes, Pipe   Quit date: 12/17/2014   Years since quitting: 5.4  Smokeless Tobacco Never Used    Goals Met:  Independence with exercise equipment Exercise tolerated well No report of cardiac concerns or symptoms  Goals Unmet:  Not Applicable  Comments: Pt able to follow exercise prescription today without complaint.  Will continue to monitor for progression.    Dr. Emily Filbert is Medical Director for [place] and [facility].

## 2020-05-20 ENCOUNTER — Encounter: Payer: Self-pay | Admitting: *Deleted

## 2020-05-20 ENCOUNTER — Other Ambulatory Visit: Payer: Self-pay

## 2020-05-20 ENCOUNTER — Encounter: Payer: BC Managed Care – PPO | Admitting: *Deleted

## 2020-05-20 DIAGNOSIS — I213 ST elevation (STEMI) myocardial infarction of unspecified site: Secondary | ICD-10-CM

## 2020-05-20 DIAGNOSIS — Z955 Presence of coronary angioplasty implant and graft: Secondary | ICD-10-CM

## 2020-05-20 NOTE — Progress Notes (Signed)
Cardiac Individual Treatment Plan  Patient Details  Name: [name] MRN: [id number] Date of Birth: 17-Sep-1946 Referring Provider:     Cardiac Rehab from 05/07/2020 in [facility] Cardiac and Pulmonary Rehab  Referring Provider Martinique, Peter MD      Initial Encounter Date:    Cardiac Rehab from 05/07/2020 in [facility] Cardiac and Pulmonary Rehab  Date 05/07/20      Visit Diagnosis: ST elevation myocardial infarction (STEMI), unspecified artery [facility])  Status post coronary artery stent placement  Patient's Home Medications on Admission:  Current Outpatient Medications:  .  acetaminophen (TYLENOL) 650 MG CR tablet, Take 1,300 mg by mouth every 8 (eight) hours as needed for pain., Disp: , Rfl:  .  Alirocumab (PRALUENT) 150 MG/ML SOAJ, Inject 150 mg into the skin every 14 (fourteen) days., Disp: 2 pen, Rfl: 11 .  aspirin EC 81 MG tablet, Take 1 tablet (81 mg total) by mouth daily., Disp:  , Rfl:  .  B Complex-C (B-COMPLEX WITH VITAMIN C) tablet, Take 1 tablet by mouth daily with breakfast. , Disp: , Rfl:  .  cetirizine (ZYRTEC) 10 MG tablet, Take 10 mg by mouth daily.  , Disp: , Rfl:  .  clopidogrel (PLAVIX) 75 MG tablet, Take 1 tablet (75 mg total) by mouth daily with breakfast., Disp: 30 tablet, Rfl: 11 .  empagliflozin (JARDIANCE) 10 MG TABS tablet, Take 10 mg by mouth daily., Disp: , Rfl:  .  fluticasone (FLONASE) 50 MCG/ACT nasal spray, Place 2 sprays into both nostrils daily as needed for allergies. , Disp: , Rfl:  .  isosorbide mononitrate (IMDUR) 30 MG 24 hr tablet, Take 0.5 tablets (15 mg total) by mouth daily., Disp: 16 tablet, Rfl: 6 .  lansoprazole (PREVACID) 15 MG capsule, Take 15 mg by mouth daily.  , Disp: , Rfl:  .  lisinopril (ZESTRIL) 20 MG tablet, Take 1 tablet (20 mg total) by mouth daily., Disp: 30 tablet, Rfl: 1 .  metFORMIN (GLUCOPHAGE) 500 MG tablet, Take 1 tablet (500 mg total) by mouth 2 (two) times daily with a meal. (Patient not taking: Reported on 04/24/2020), Disp:  ,  Rfl:  .  metoprolol tartrate 37.5 MG TABS, Take 37.5 mg by mouth 2 (two) times daily., Disp: 90 tablet, Rfl: 6 .  Multiple Minerals-Vitamins (CALCIUM-MAGNESIUM-ZINC-D3) TABS, Take 1 tablet by mouth daily with lunch. , Disp: , Rfl:  .  nitroGLYCERIN (NITROSTAT) 0.4 MG SL tablet, Place 1 tablet (0.4 mg total) under the tongue every 5 (five) minutes as needed., Disp: 25 tablet, Rfl: 12 .  OneTouch Delica Lancets 75Q MISC, 1 each by Other route daily., Disp: , Rfl:  .  ONETOUCH VERIO test strip, 1 each by Other route daily., Disp: , Rfl:  .  Tamsulosin HCl (FLOMAX) 0.4 MG CAPS, Take 0.4 mg by mouth daily after supper. , Disp: , Rfl:   Past Medical History: Past Medical History:  Diagnosis Date  . Arthritis   . Arthritis   . Atrial fibrillation ([place])   . BPH (benign prostatic hypertrophy)   . CAD (coronary artery disease)    stents placed  . Diabetes mellitus   . Diverticulosis   . Dyspnea    when exerting self  . Dysrhythmia    "extra beats"  . Esophageal stricture   . GERD (gastroesophageal reflux disease)   . Headache(784.0)   . Hemorrhoids   . Hernia, inguinal, right   . Hiatal hernia   . Hyperlipidemia   . Hypertension   .  Pneumonia   . Staph infection    Left ankle  . 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 Urinary hesitancy     Tobacco Use: Social History   Tobacco Use  Smoking Status Former Smoker  . Packs/day: 1.00  . Years: 22.00  . Pack years: 22.00  . Types: Cigarettes, Pipe  . Quit date: 12/17/2014  . Years since quitting: 5.4  Smokeless Tobacco Never Used    Labs: Recent Review Flowsheet Data    Labs for ITP Cardiac and Pulmonary Rehab Latest Ref Rng & Units 12/20/2019 12/22/2019 12/23/2019 03/16/2020 03/17/2020   Cholestrol 0 - 200 mg/dL - - - 205(H) -   LDLCALC 0 - 99 mg/dL - - - 137(H) -   HDL >40 mg/dL - - - 47 -   Trlycerides <150 mg/dL - - - 107 -   Hemoglobin A1c 4.8 - 5.6 % - - - 7.2(H) 7.2(H)   PHART 7.35 - 7.45 7.409 - - - -   PCO2ART 32 - 48  mmHg 39.5 - - - -   HCO3 20.0 - 28.0 mmol/L 24.7 - - - -   TCO2 22 - 32 mmol/L 26 - - 26 -   ACIDBASEDEF 0.0 - 2.0 mmol/L - - - - -   O2SAT % 98.0 54.1 72.7 - -       Exercise Target Goals: Exercise Program Goal: Individual exercise prescription set using results from initial 6 min walk test and THRR while considering  patient's activity barriers and safety.   Exercise Prescription Goal: Initial exercise prescription builds to 30-45 minutes a day of aerobic activity, 2-3 days per week.  Home exercise guidelines will be given to patient during program as part of exercise prescription that the participant will acknowledge.   Education: Aerobic Exercise & Resistance Training: - Gives group verbal and written instruction on the various components of exercise. Focuses on aerobic and resistive training programs and the benefits of this training and how to safely progress through these programs..   Education: Exercise & Equipment Safety: - Individual verbal instruction and demonstration of equipment use and safety with use of the equipment.   Cardiac Rehab from 05/07/2020 in [facility] Cardiac and Pulmonary Rehab  Date 05/07/20  Educator [facility]  Instruction Review Code 1- Verbalizes Understanding      Education: Exercise Physiology & General Exercise Guidelines: - Group verbal and written instruction with models to review the exercise physiology of the cardiovascular system and associated critical values. Provides general exercise guidelines with specific guidelines to those with heart or lung disease.    Education: Flexibility, Balance, Mind/Body Relaxation: Provides group verbal/written instruction on the benefits of flexibility and balance training, including mind/body exercise modes such as yoga, pilates and tai chi.  Demonstration and skill practice provided.   Activity Barriers & Risk Stratification:  Activity Barriers & Cardiac Risk Stratification - 04/24/20 1403      Activity Barriers &  Cardiac Risk Stratification   Activity Barriers Back Problems;Arthritis   hx knee surgery   Cardiac Risk Stratification High           6 Minute Walk:  6 Minute Walk    Row Name 05/07/20 1053         6 Minute Walk   Phase Initial     Distance 1230 feet     Walk Time 6 minutes     # of Rest Breaks 0     MPH 2.33     METS 2.83  RPE 7     VO2 Peak 9.91     Symptoms No     Resting HR 58 bpm     Resting BP 114/72     Resting Oxygen Saturation  97 %     Exercise Oxygen Saturation  during 6 min walk 97 %     Max Ex. HR 84 bpm     Max Ex. BP 156/66     2 Minute Post BP 104/58            Oxygen Initial Assessment:   Oxygen Re-Evaluation:   Oxygen Discharge (Final Oxygen Re-Evaluation):   Initial Exercise Prescription:  Initial Exercise Prescription - 05/07/20 1100      Date of Initial Exercise RX and Referring Provider   Date 05/07/20    Referring Provider Martinique, Peter MD      Treadmill   MPH 2.3    Grade 0.5    Minutes 15    METs 2.92      Recumbant Bike   Level 2    RPM 50    Watts 19    Minutes 15    METs 2      NuStep   Level 1    SPM 80    Minutes 15    METs 2      Arm Ergometer   Level 2    Watts 30    RPM 25    Minutes 15    METs 2      REL-XR   Level 1    Speed 50    Minutes 15    METs 2      T5 Nustep   Level 1    SPM 80    Minutes 15    METs 2      Prescription Details   Frequency (times per week) 2    Duration Progress to 30 minutes of continuous aerobic without signs/symptoms of physical distress      Intensity   THRR 40-80% of Max Heartrate 93-128    Ratings of Perceived Exertion 11-13    Perceived Dyspnea 0-4      Progression   Progression Continue to progress workloads to maintain intensity without signs/symptoms of physical distress.      Resistance Training   Training Prescription Yes    Weight 3 lb    Reps 10-15           Perform Capillary Blood Glucose checks as needed.  Exercise Prescription  Changes:  Exercise Prescription Changes    Row Name 05/07/20 1100 05/19/20 1300           Response to Exercise   Blood Pressure (Admit) 114/72 124/60      Blood Pressure (Exercise) 152/66 152/64      Blood Pressure (Exit) 104/58 122/76      Heart Rate (Admit) 58 bpm 64 bpm      Heart Rate (Exercise) 84 bpm 115 bpm      Heart Rate (Exit) 59 bpm 66 bpm      Oxygen Saturation (Admit) 97 % --      Oxygen Saturation (Exercise) 97 % --      Rating of Perceived Exertion (Exercise) 7 13      Symptoms none none      Comments walk test results --        Progression   Progression -- Continue to progress workloads to maintain intensity without signs/symptoms of physical distress.      Average  METs -- 2.8        Resistance Training   Training Prescription -- Yes      Weight -- 3 lb      Reps -- 10-15        Interval Training   Interval Training -- No        Treadmill   MPH -- 2.3      Grade -- 0.5      Minutes -- 15      METs -- 2.92        REL-XR   Level -- 1      Speed -- 50      Minutes -- 15      METs -- 2.6             Exercise Comments:  Exercise Comments    Row Name 05/11/20 [phone]           Exercise Comments First full day of exercise!  Patient was oriented to gym and equipment including functions, settings, policies, and procedures.  Patient's individual exercise prescription and treatment plan were reviewed.  All starting workloads were established based on the results of the 6 minute walk test done at initial orientation visit.  The plan for exercise progression was also introduced and progression will be customized based on patient's performance and goals.              Exercise Goals and Review:  Exercise Goals    Row Name 05/07/20 1114             Exercise Goals   Increase Physical Activity Yes       Intervention Provide advice, education, support and counseling about physical activity/exercise needs.;Develop an individualized exercise prescription  for aerobic and resistive training based on initial evaluation findings, risk stratification, comorbidities and participant's personal goals.       Expected Outcomes Short Term: Attend rehab on a regular basis to increase amount of physical activity.;Long Term: Add in home exercise to make exercise part of routine and to increase amount of physical activity.;Long Term: Exercising regularly at least 3-5 days a week.       Increase Strength and Stamina Yes       Intervention Provide advice, education, support and counseling about physical activity/exercise needs.;Develop an individualized exercise prescription for aerobic and resistive training based on initial evaluation findings, risk stratification, comorbidities and participant's personal goals.       Expected Outcomes Short Term: Increase workloads from initial exercise prescription for resistance, speed, and METs.;Long Term: Improve cardiorespiratory fitness, muscular endurance and strength as measured by increased METs and functional capacity (6MWT);Short Term: Perform resistance training exercises routinely during rehab and add in resistance training at home       Able to understand and use rate of perceived exertion (RPE) scale Yes       Intervention Provide education and explanation on how to use RPE scale       Expected Outcomes Short Term: Able to use RPE daily in rehab to express subjective intensity level;Long Term:  Able to use RPE to guide intensity level when exercising independently       Able to understand and use Dyspnea scale Yes       Intervention Provide education and explanation on how to use Dyspnea scale       Expected Outcomes Short Term: Able to use Dyspnea scale daily in rehab to express subjective sense of shortness of breath during exertion;Long Term: Able to use  Dyspnea scale to guide intensity level when exercising independently       Knowledge and understanding of Target Heart Rate Range (THRR) Yes       Intervention  Provide education and explanation of THRR including how the numbers were predicted and where they are located for reference       Expected Outcomes Short Term: Able to state/look up THRR;Short Term: Able to use daily as guideline for intensity in rehab;Long Term: Able to use THRR to govern intensity when exercising independently       Able to check pulse independently Yes       Intervention Provide education and demonstration on how to check pulse in carotid and radial arteries.;Review the importance of being able to check your own pulse for safety during independent exercise       Expected Outcomes Short Term: Able to explain why pulse checking is important during independent exercise;Long Term: Able to check pulse independently and accurately       Understanding of Exercise Prescription Yes       Intervention Provide education, explanation, and written materials on patient's individual exercise prescription       Expected Outcomes Short Term: Able to explain program exercise prescription;Long Term: Able to explain home exercise prescription to exercise independently              Exercise Goals Re-Evaluation :  Exercise Goals Re-Evaluation    Row Name 05/11/20 0958 05/19/20 1355           Exercise Goal Re-Evaluation   Exercise Goals Review Increase Physical Activity;Increase Strength and Stamina;Able to understand and use rate of perceived exertion (RPE) scale;Able to understand and use Dyspnea scale;Knowledge and understanding of Target Heart Rate Range (THRR);Understanding of Exercise Prescription --      Comments Reviewed RPE and dyspnea scales, THR and program prescription with pt today.  Pt voiced understanding and was given a copy of goals to take home. Laurey Arrow has tolerated exercise well in hs first full week.  Staff will monitor progress.      Expected Outcomes Short: Use RPE daily to regulate intensity. Long: Follow program prescription in THR. Short: attend consistently Long:  improve  overall stamina             Discharge Exercise Prescription (Final Exercise Prescription Changes):  Exercise Prescription Changes - 05/19/20 1300      Response to Exercise   Blood Pressure (Admit) 124/60    Blood Pressure (Exercise) 152/64    Blood Pressure (Exit) 122/76    Heart Rate (Admit) 64 bpm    Heart Rate (Exercise) 115 bpm    Heart Rate (Exit) 66 bpm    Rating of Perceived Exertion (Exercise) 13    Symptoms none      Progression   Progression Continue to progress workloads to maintain intensity without signs/symptoms of physical distress.    Average METs 2.8      Resistance Training   Training Prescription Yes    Weight 3 lb    Reps 10-15      Interval Training   Interval Training No      Treadmill   MPH 2.3    Grade 0.5    Minutes 15    METs 2.92      REL-XR   Level 1    Speed 50    Minutes 15    METs 2.6           Nutrition:  Target Goals: Understanding of nutrition  guidelines, daily intake of sodium <1564m, cholesterol <2078m calories 30% from fat and 7% or less from saturated fats, daily to have 5 or more servings of fruits and vegetables.  Education: Controlling Sodium/Reading Food Labels -Group verbal and written material supporting the discussion of sodium use in heart healthy nutrition. Review and explanation with models, verbal and written materials for utilization of the food label.   Education: General Nutrition Guidelines/Fats and Fiber: -Group instruction provided by verbal, written material, models and posters to present the general guidelines for heart healthy nutrition. Gives an explanation and review of dietary fats and fiber.   Biometrics:  Pre Biometrics - 05/07/20 1115      Pre Biometrics   Height 5' 8.8" (1.748 m)    Weight 156 lb 14.4 oz (71.2 kg)    BMI (Calculated) 23.29    Single Leg Stand 5 seconds            Nutrition Therapy Plan and Nutrition Goals:   Nutrition Assessments:  Nutrition Assessments -  05/11/20 0927      MEDFICTS Scores   Pre Score 66           MEDIFICTS Score Key:          ?70 Need to make dietary changes          40-70 Heart Healthy Diet         ? 40 Therapeutic Level Cholesterol Diet  Nutrition Goals Re-Evaluation:   Nutrition Goals Discharge (Final Nutrition Goals Re-Evaluation):   Psychosocial: Target Goals: Acknowledge presence or absence of significant depression and/or stress, maximize coping skills, provide positive support system. Participant is able to verbalize types and ability to use techniques and skills needed for reducing stress and depression.   Education: Depression - Provides group verbal and written instruction on the correlation between heart/lung disease and depressed mood, treatment options, and the stigmas associated with seeking treatment.   Education: Sleep Hygiene -Provides group verbal and written instruction about how sleep can affect your health.  Define sleep hygiene, discuss sleep cycles and impact of sleep habits. Review good sleep hygiene tips.     Education: Stress and Anxiety: - Provides group verbal and written instruction about the health risks of elevated stress and causes of high stress.  Discuss the correlation between heart/lung disease and anxiety and treatment options. Review healthy ways to manage with stress and anxiety.    Initial Review & Psychosocial Screening:  Initial Psych Review & Screening - 04/24/20 1407      Initial Review   Current issues with Current Stress Concerns    Source of Stress Concerns Chronic Illness      Family Dynamics   Good Support System? Yes      Barriers   Psychosocial barriers to participate in program There are no identifiable barriers or psychosocial needs.;The patient should benefit from training in stress management and relaxation.      Screening Interventions   Interventions Encouraged to exercise;To provide support and resources with identified psychosocial  needs;Provide feedback about the scores to participant    Expected Outcomes Short Term goal: Utilizing psychosocial counselor, staff and physician to assist with identification of specific Stressors or current issues interfering with healing process. Setting desired goal for each stressor or current issue identified.;Long Term Goal: Stressors or current issues are controlled or eliminated.;Short Term goal: Identification and review with participant of any Quality of Life or Depression concerns found by scoring the questionnaire.;Long Term goal: The participant improves quality of Life  and PHQ9 Scores as seen by post scores and/or verbalization of changes           Quality of Life Scores:   Quality of Life - 05/11/20 0928      Quality of Life   Select Quality of Life      Quality of Life Scores   Health/Function Pre 22.4 %    Socioeconomic Pre 22.58 %    Psych/Spiritual Pre 24 %    Family Pre 20.4 %    GLOBAL Pre 22.47 %          Scores of 19 and below usually indicate a poorer quality of life in these areas.  A difference of  2-3 points is a clinically meaningful difference.  A difference of 2-3 points in the total score of the Quality of Life Index has been associated with significant improvement in overall quality of life, self-image, physical symptoms, and general health in studies assessing change in quality of life.  PHQ-9: Recent Review Flowsheet Data    Depression screen [facility] 2/9 05/11/2020   Decreased Interest 0   Down, Depressed, Hopeless 0   PHQ - 2 Score 0   Altered sleeping 0   Tired, decreased energy 0   Change in appetite 0   Feeling bad or failure about yourself  0   Trouble concentrating 0   Moving slowly or fidgety/restless 0   Suicidal thoughts 0   PHQ-9 Score 0   Difficult doing work/chores Not difficult at all     Interpretation of Total Score  Total Score Depression Severity:  1-4 = Minimal depression, 5-9 = Mild depression, 10-14 = Moderate depression,  15-19 = Moderately severe depression, 20-27 = Severe depression   Psychosocial Evaluation and Intervention:  Psychosocial Evaluation - 04/24/20 1417      Psychosocial Evaluation & Interventions   Comments Edilson has 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 He and his wife both state that his memory has not been good since the CABG, so she has to help him remember a lot more. He says he doesn't feel too stressed out and that he sleeps very well. He and his wife look forward to him starting cardiac rehab for educaion and exercise.    Expected Outcomes Short: attend [facility] for exercise and education. Long: develop positive self care habits.    Continue Psychosocial Services  Follow up required by staff           Psychosocial Re-Evaluation:   Psychosocial Discharge (Final Psychosocial Re-Evaluation):   Vocational Rehabilitation: Provide vocational rehab assistance to qualifying candidates.   Vocational Rehab Evaluation & Intervention:  Vocational Rehab - 04/24/20 1407      Initial Vocational Rehab Evaluation & Intervention   Assessment shows need for Vocational Rehabilitation No           Education: Education Goals: Education classes will be provided on a variety of topics geared toward better understanding of heart health and risk factor modification. Participant will state understanding/return demonstration of topics presented as noted by education test scores.  Learning Barriers/Preferences:  Learning Barriers/Preferences - 04/24/20 1408      Learning Barriers/Preferences   Learning Barriers None    Learning Preferences None           General Cardiac Education Topics:  AED/CPR: - Group verbal and written instruction with the use of models to demonstrate the basic use of the AED with the basic ABC's of resuscitation.   Anatomy & Physiology  of the Heart: - Group verbal and written instruction and models provide basic cardiac anatomy and physiology,  with the coronary electrical and arterial systems. Review of Valvular disease and Heart Failure   Cardiac Procedures: - Group verbal and written instruction to review commonly prescribed medications for heart disease. Reviews the medication, class of the drug, and side effects. Includes the steps to properly store meds and maintain the prescription regimen. (beta blockers and nitrates)   Cardiac Medications I: - Group verbal and written instruction to review commonly prescribed medications for heart disease. Reviews the medication, class of the drug, and side effects. Includes the steps to properly store meds and maintain the prescription regimen.   Cardiac Medications II: -Group verbal and written instruction to review commonly prescribed medications for heart disease. Reviews the medication, class of the drug, and side effects. (all other drug classes)    Go Sex-Intimacy & Heart Disease, Get SMART - Goal Setting: - Group verbal and written instruction through game format to discuss heart disease and the return to sexual intimacy. Provides group verbal and written material to discuss and apply goal setting through the application of the S.M.A.R.T. Method.   Other Matters of the Heart: - Provides group verbal, written materials and models to describe Stable Angina and Peripheral Artery. Includes description of the disease process and treatment options available to the cardiac patient.   Infection Prevention: - Provides verbal and written material to individual with discussion of infection control including proper hand washing and proper equipment cleaning during exercise session.   Cardiac Rehab from 05/07/2020 in [facility] Cardiac and Pulmonary Rehab  Date 05/07/20  Educator [facility]  Instruction Review Code 1- Verbalizes Understanding      Falls Prevention: - Provides verbal and written material to individual with discussion of falls prevention and safety.   Cardiac Rehab from 05/07/2020 in  [facility] Cardiac and Pulmonary Rehab  Date 05/07/20  Educator [facility]  Instruction Review Code 1- Verbalizes Understanding      Other: -Provides group and verbal instruction on various topics (see comments)   Knowledge Questionnaire Score:  Knowledge Questionnaire Score - 05/11/20 6378      Knowledge Questionnaire Score   Pre Score 21/26 Education focus: Angina, Sex, Heart Failure, PAD, Nutrition, Exercise           Core Components/Risk Factors/Patient Goals at Admission:  Personal Goals and Risk Factors at Admission - 05/07/20 1115      Core Components/Risk Factors/Patient Goals on Admission    Weight Management Yes;Weight Maintenance    Intervention Weight Management: Develop a combined nutrition and exercise program designed to reach desired caloric intake, while maintaining appropriate intake of nutrient and fiber, sodium and fats, and appropriate energy expenditure required for the weight goal.;Weight Management: Provide education and appropriate resources to help participant work on and attain dietary goals.    Admit Weight 156 lb 14.4 oz (71.2 kg)    Goal Weight: Short Term 156 lb (70.8 kg)    Goal Weight: Long Term 156 lb (70.8 kg)    Expected Outcomes Short Term: Continue to assess and modify interventions until short term weight is achieved;Long Term: Adherence to nutrition and physical activity/exercise program aimed toward attainment of established weight goal;Weight Maintenance: Understanding of the daily nutrition guidelines, which includes 25-35% calories from fat, 7% or less cal from saturated fats, less than 225m cholesterol, less than 1.5gm of sodium, & 5 or more servings of fruits and vegetables daily    Diabetes Yes  Intervention Provide education about signs/symptoms and action to take for hypo/hyperglycemia.;Provide education about proper nutrition, including hydration, and aerobic/resistive exercise prescription along with prescribed medications to achieve blood  glucose in normal ranges: Fasting glucose 65-99 mg/dL    Expected Outcomes Short Term: Participant verbalizes understanding of the signs/symptoms and immediate care of hyper/hypoglycemia, proper foot care and importance of medication, aerobic/resistive exercise and nutrition plan for blood glucose control.;Long Term: Attainment of HbA1C < 7%.    Hypertension Yes    Intervention Provide education on lifestyle modifcations including regular physical activity/exercise, weight management, moderate sodium restriction and increased consumption of fresh fruit, vegetables, and low fat dairy, alcohol moderation, and smoking cessation.;Monitor prescription use compliance.    Expected Outcomes Short Term: Continued assessment and intervention until BP is < 140/99m HG in hypertensive participants. < 130/859mHG in hypertensive participants with diabetes, heart failure or chronic kidney disease.;Long Term: Maintenance of blood pressure at goal levels.    Lipids Yes    Intervention Provide education and support for participant on nutrition & aerobic/resistive exercise along with prescribed medications to achieve LDL <7064mHDL >79m45m  Expected Outcomes Short Term: Participant states understanding of desired cholesterol values and is compliant with medications prescribed. Participant is following exercise prescription and nutrition guidelines.;Long Term: Cholesterol controlled with medications as prescribed, with individualized exercise RX and with personalized nutrition plan. Value goals: LDL < 70mg36mL > 40 mg.           Education:Diabetes - Individual verbal and written instruction to review signs/symptoms of diabetes, desired ranges of glucose level fasting, after meals and with exercise. Acknowledge that pre and post exercise glucose checks will be done for 3 sessions at entry of program.   [facility]  Date 04/24/20  Educator [facility]  [facility]  Code 1- [employer] States Steel Corporationrstanding      Education: Know Your Numbers and Risk Factors: -Group verbal and written instruction about important numbers in your health.  Discussion of what are risk factors and how they play a role in the disease process.  Review of Cholesterol, Blood Pressure, Diabetes, and BMI and the role they play in your overall health.   Core Components/Risk Factors/Patient Goals Review:    Core Components/Risk Factors/Patient Goals at Discharge (Final Review):    ITP Comments:  ITP Comments    Row Name 04/24/20 1416 05/07/20 1053 05/11/20 0957 05/20/20 0620 [id number] Comments Initial telephone orientation completed. Diagnosis [facility]. EP orientation scheduled for 5/27 at 11am Completed 6MWT and gym orientation.  Initial ITP created and sent for review to Dr. Mark Emily Filbertical Director. First full day of exercise!  Patient was oriented to gym and equipment including functions, settings, policies, and procedures.  Patient's individual exercise prescription and treatment plan were reviewed.  All starting workloads were established based on the results of the 6 minute walk test done at initial orientation visit.  The plan for exercise progression was also introduced and progression will be customized based on patient's performance and goals. 30 Day review completed. Medical Director ITP review done, changes made as directed, and signed approval by Medical Director.           Comments: 30 Day review completed. Medical Director ITP review done, changes made as directed, and signed approval by Medical Director.

## 2020-05-20 NOTE — Progress Notes (Signed)
Daily Session Note  Patient Details  Name: [name] MRN: [id number] Date of Birth: 04/25/1946 Referring Provider:     Cardiac Rehab from 05/07/2020 in [facility]  Referring Provider Jordan, Peter MD      Encounter Date: 05/20/2020  Check In:  Session Check In - 05/20/20 0817      Check-In   Supervising physician immediately available to respond to emergencies See telemetry face sheet for immediately available ER MD    Location [facility]    Staff Present  , RN, BSN, CCRP;Joseph Hood RCP,RRT,BSRT;Amanda Sommer, BA, ACSM CEP, Exercise Physiologist    Virtual Visit No    Medication changes reported     No    Fall or balance concerns reported    No    Warm-up and Cool-down Performed on first and last piece of equipment    Resistance Training Performed Yes    VAD Patient? No    PAD/SET Patient? No      Pain Assessment   Currently in Pain? No/denies              Social History   Tobacco Use  Smoking Status Former Smoker  . Packs/day: 1.00  . Years: 22.00  . Pack years: 22.00  . Types: Cigarettes, Pipe  . Quit date: 12/17/2014  . Years since quitting: 5.4  Smokeless Tobacco Never Used    Goals Met:  Independence with exercise equipment Exercise tolerated well No report of cardiac concerns or symptoms  Goals Unmet:  Not Applicable  Comments: Pt able to follow exercise prescription today without complaint.  Will continue to monitor for progression.    Dr. Mark Miller is Medical Director for [facility] and [facility]. 

## 2020-05-25 ENCOUNTER — Encounter: Payer: BC Managed Care – PPO | Admitting: *Deleted

## 2020-05-25 ENCOUNTER — Other Ambulatory Visit: Payer: Self-pay

## 2020-05-25 DIAGNOSIS — I213 ST elevation (STEMI) myocardial infarction of unspecified site: Secondary | ICD-10-CM | POA: Diagnosis not present

## 2020-05-25 DIAGNOSIS — Z955 Presence of coronary angioplasty implant and graft: Secondary | ICD-10-CM

## 2020-05-25 NOTE — Progress Notes (Signed)
Daily Session Note  Patient Details  Name: [name] MRN: [id number] Date of Birth: 10/03/1946 Referring Provider:     Cardiac Rehab from 05/07/2020 in [facility]  Referring Provider Jordan, Peter MD      Encounter Date: 05/25/2020  Check In:  Session Check In - 05/25/20 0835      Check-In   Supervising physician immediately available to respond to emergencies See telemetry face sheet for immediately available ER MD    Location [facility]    Staff Present  , RN, BSN, CCRP;Joseph Hood RCP,RRT,BSRT;Kelly Hayes, BS, ACSM CEP, Exercise Physiologist    Virtual Visit No    Medication changes reported     No    Fall or balance concerns reported    No    Warm-up and Cool-down Performed on first and last piece of equipment    Resistance Training Performed Yes    VAD Patient? No    PAD/SET Patient? No      Pain Assessment   Currently in Pain? No/denies              Social History   Tobacco Use  Smoking Status Former Smoker  . Packs/day: 1.00  . Years: 22.00  . Pack years: 22.00  . Types: Cigarettes, Pipe  . Quit date: 12/17/2014  . Years since quitting: 5.4  Smokeless Tobacco Never Used    Goals Met:  Independence with exercise equipment Exercise tolerated well No report of cardiac concerns or symptoms  Goals Unmet:  Not Applicable  Comments: Pt able to follow exercise prescription today without complaint.  Will continue to monitor for progression.    Dr. Mark Miller is Medical Director for [facility] and [facility]. 

## 2020-05-27 ENCOUNTER — Encounter: Payer: BC Managed Care – PPO | Admitting: *Deleted

## 2020-05-27 ENCOUNTER — Other Ambulatory Visit: Payer: Self-pay

## 2020-05-27 DIAGNOSIS — I213 ST elevation (STEMI) myocardial infarction of unspecified site: Secondary | ICD-10-CM | POA: Diagnosis not present

## 2020-05-27 DIAGNOSIS — Z955 Presence of coronary angioplasty implant and graft: Secondary | ICD-10-CM

## 2020-05-27 LAB — GLUCOSE, CAPILLARY
Glucose-Capillary: 108 mg/dL — ABNORMAL HIGH (ref 70–99)
Glucose-Capillary: 208 mg/dL — ABNORMAL HIGH (ref 70–99)

## 2020-05-27 NOTE — Progress Notes (Signed)
Daily Session Note  Patient Details  Name: [name] MRN: [id number] Date of Birth: 19-Aug-1946 Referring Provider:     Cardiac Rehab from 05/07/2020 in [facility]  Referring Provider Martinique, Peter MD      Encounter Date: 05/27/2020  Check In:  Session Check In - 05/27/20 0926      Check-In   Supervising physician immediately available to respond to emergencies See telemetry face sheet for immediately available ER MD    Location [facility]    Staff Present Heath Lark, RN, BSN, CCRP;Jessica [place], MA, RCEP, CCRP, [place], [place], ACSM CEP, Exercise Physiologist    Virtual Visit No    Medication changes reported     No    Fall or balance concerns reported    No    Warm-up and Cool-down Performed on first and last piece of equipment    Resistance Training Performed Yes    VAD Patient? No    PAD/SET Patient? No      Pain Assessment   Currently in Pain? No/denies              Social History   Tobacco Use  Smoking Status Former Smoker  . Packs/day: 1.00  . Years: 22.00  . Pack years: 22.00  . Types: Cigarettes, Pipe  . Quit date: 12/17/2014  . Years since quitting: 5.4  Smokeless Tobacco Never Used    Goals Met:  Independence with exercise equipment Exercise tolerated well No report of cardiac concerns or symptoms  Goals Unmet:  Not Applicable  Comments: Pt able to follow exercise prescription today without complaint.  Will continue to monitor for progression.    Dr. Emily Filbert is Medical Director for [place] and [facility].

## 2020-06-01 ENCOUNTER — Other Ambulatory Visit: Payer: Self-pay

## 2020-06-01 ENCOUNTER — Encounter: Payer: BC Managed Care – PPO | Admitting: *Deleted

## 2020-06-01 DIAGNOSIS — I213 ST elevation (STEMI) myocardial infarction of unspecified site: Secondary | ICD-10-CM

## 2020-06-01 DIAGNOSIS — Z955 Presence of coronary angioplasty implant and graft: Secondary | ICD-10-CM

## 2020-06-01 NOTE — Progress Notes (Signed)
Daily Session Note  Patient Details  Name: [name] MRN: [id number] Date of Birth: 10/14/1946 Referring Provider:     Cardiac Rehab from 05/07/2020 in [facility]  Referring Provider Jordan, Peter MD      Encounter Date: 06/01/2020  Check In:  Session Check In - 06/01/20 0855      Check-In   Supervising physician immediately available to respond to emergencies See telemetry face sheet for immediately available ER MD    Location [facility]    Staff Present  , RN, BSN, CCRP;Joseph Hood RCP,RRT,BSRT;Amanda Sommer, BA, ACSM CEP, Exercise Physiologist    Virtual Visit No    Fall or balance concerns reported    No    Warm-up and Cool-down Performed on first and last piece of equipment    Resistance Training Performed Yes    VAD Patient? No    PAD/SET Patient? No      Pain Assessment   Currently in Pain? No/denies              Social History   Tobacco Use  Smoking Status Former Smoker  . Packs/day: 1.00  . Years: 22.00  . Pack years: 22.00  . Types: Cigarettes, Pipe  . Quit date: 12/17/2014  . Years since quitting: 5.4  Smokeless Tobacco Never Used    Goals Met:  Independence with exercise equipment Exercise tolerated well No report of cardiac concerns or symptoms  Goals Unmet:  Not Applicable  Comments: Pt able to follow exercise prescription today without complaint.  Will continue to monitor for progression.    Dr. Mark Miller is Medical Director for [facility] and [facility]. 

## 2020-06-03 ENCOUNTER — Other Ambulatory Visit: Payer: Self-pay

## 2020-06-03 DIAGNOSIS — Z955 Presence of coronary angioplasty implant and graft: Secondary | ICD-10-CM

## 2020-06-03 DIAGNOSIS — I213 ST elevation (STEMI) myocardial infarction of unspecified site: Secondary | ICD-10-CM

## 2020-06-03 NOTE — Progress Notes (Signed)
Daily Session Note  Patient Details  Name: [name] MRN: [id number] Date of Birth: 29-Jul-1946 Referring Provider:     Cardiac Rehab from 05/07/2020 in [facility]  Referring Provider [place], Peter MD      Encounter Date: 06/03/2020  Check In:  Session Check In - 06/03/20 0809      Check-In   Supervising physician immediately available to respond to emergencies See telemetry face sheet for immediately available ER MD    Location [facility]    Staff Present Justin Mend RCP,RRT,BSRT;Vida Rigger RN, BSN;Jessica [place], MA, RCEP, CCRP, [place], [place], ACSM CEP, Exercise Physiologist;Kara Eliezer Bottom, MS Exercise Physiologist    Virtual Visit No    Medication changes reported     No    Fall or balance concerns reported    No    Warm-up and Cool-down Performed on first and last piece of equipment    Resistance Training Performed Yes    VAD Patient? No    PAD/SET Patient? No      Pain Assessment   Currently in Pain? No/denies              Social History   Tobacco Use  Smoking Status Former Smoker  . Packs/day: 1.00  . Years: 22.00  . Pack years: 22.00  . Types: Cigarettes, Pipe  . Quit date: 12/17/2014  . Years since quitting: 5.4  Smokeless Tobacco Never Used    Goals Met:  Independence with exercise equipment Exercise tolerated well No report of cardiac concerns or symptoms Strength training completed today  Goals Unmet:  Not Applicable  Comments: Pt able to follow exercise prescription today without complaint.  Will continue to monitor for progression.   Dr. Emily Filbert is Medical Director for [place] and [facility].

## 2020-06-10 ENCOUNTER — Other Ambulatory Visit: Payer: Self-pay

## 2020-06-10 ENCOUNTER — Encounter: Payer: BC Managed Care – PPO | Attending: Cardiology | Admitting: *Deleted

## 2020-06-10 ENCOUNTER — Ambulatory Visit: Payer: BC Managed Care – PPO | Admitting: Cardiothoracic Surgery

## 2020-06-10 DIAGNOSIS — Z955 Presence of coronary angioplasty implant and graft: Secondary | ICD-10-CM | POA: Insufficient documentation

## 2020-06-10 DIAGNOSIS — I213 ST elevation (STEMI) myocardial infarction of unspecified site: Secondary | ICD-10-CM

## 2020-06-10 DIAGNOSIS — I252 Old myocardial infarction: Secondary | ICD-10-CM | POA: Diagnosis present

## 2020-06-10 NOTE — Progress Notes (Signed)
Daily Session Note  Patient Details  Name: [name] MRN: [id number] Date of Birth: 30-Jul-1946 Referring Provider:     Cardiac Rehab from 05/07/2020 in [facility]  Referring Provider Martin[place], Peter MD      Encounter Date: 06/10/2020  Check In:  Session Check In - 06/10/20 0728      Check-In   Supervising physician immediately available to respond to emergencies See telemetry face sheet for immediately available ER MD    Location [facility]    Staff Present Renita Papa, RN BSN;Joseph [address] [place], [place], [place], CCRP, [place], [place], ACSM CEP, Exercise Physiologist    Virtual Visit No    Medication changes reported     No    Fall or balance concerns reported    No    Warm-up and Cool-down Performed on first and last piece of equipment    Resistance Training Performed Yes    VAD Patient? No    PAD/SET Patient? No      Pain Assessment   Currently in Pain? No/denies              Social History   Tobacco Use  Smoking Status Former Smoker  . Packs/day: 1.00  . Years: 22.00  . Pack years: 22.00  . Types: Cigarettes, Pipe  . Quit date: 12/17/2014  . Years since quitting: 5.4  Smokeless Tobacco Never Used    Goals Met:  Independence with exercise equipment Exercise tolerated well No report of cardiac concerns or symptoms Strength training completed today  Goals Unmet:  Not Applicable  Comments: Pt able to follow exercise prescription today without complaint.  Will continue to monitor for progression.    Dr. Emily Filbert is Medical Director for [facility] and [facility].

## 2020-06-17 ENCOUNTER — Encounter: Payer: Self-pay | Admitting: Cardiothoracic Surgery

## 2020-06-17 ENCOUNTER — Encounter: Payer: BC Managed Care – PPO | Admitting: *Deleted

## 2020-06-17 ENCOUNTER — Encounter: Payer: Self-pay | Admitting: *Deleted

## 2020-06-17 ENCOUNTER — Other Ambulatory Visit: Payer: Self-pay

## 2020-06-17 ENCOUNTER — Ambulatory Visit: Payer: BC Managed Care – PPO | Admitting: Cardiothoracic Surgery

## 2020-06-17 VITALS — BP 104/62 | HR 69 | Temp 98.1°F | Resp 20 | Ht 68.0 in | Wt 155.0 lb

## 2020-06-17 DIAGNOSIS — I213 ST elevation (STEMI) myocardial infarction of unspecified site: Secondary | ICD-10-CM

## 2020-06-17 DIAGNOSIS — Z951 Presence of aortocoronary bypass graft: Secondary | ICD-10-CM

## 2020-06-17 DIAGNOSIS — I252 Old myocardial infarction: Secondary | ICD-10-CM | POA: Diagnosis not present

## 2020-06-17 DIAGNOSIS — Z955 Presence of coronary angioplasty implant and graft: Secondary | ICD-10-CM

## 2020-06-17 DIAGNOSIS — Z4889 Encounter for other specified surgical aftercare: Secondary | ICD-10-CM | POA: Diagnosis not present

## 2020-06-17 NOTE — Progress Notes (Signed)
Cardiac Individual Treatment Plan  Patient Details  Name: [name] MRN: [id number] Date of Birth: 17-Sep-1946 Referring Provider:     Cardiac Rehab from 05/07/2020 in [facility] Cardiac and Pulmonary Rehab  Referring Provider [place], Peter MD      Initial Encounter Date:    Cardiac Rehab from 05/07/2020 in [facility] Cardiac and Pulmonary Rehab  Date 05/07/20      Visit Diagnosis: ST elevation myocardial infarction (STEMI), unspecified artery [facility])  Status post coronary artery stent placement  Patient's Home Medications on Admission:  Current Outpatient Medications:  .  acetaminophen (TYLENOL) 650 MG CR tablet, Take 1,300 mg by mouth every 8 (eight) hours as needed for pain., Disp: , Rfl:  .  Alirocumab (PRALUENT) 150 MG/ML SOAJ, Inject 150 mg into the skin every 14 (fourteen) days., Disp: 2 pen, Rfl: 11 .  aspirin EC 81 MG tablet, Take 1 tablet (81 mg total) by mouth daily., Disp:  , Rfl:  .  B Complex-C (B-COMPLEX WITH VITAMIN C) tablet, Take 1 tablet by mouth daily with breakfast. , Disp: , Rfl:  .  cetirizine (ZYRTEC) 10 MG tablet, Take 10 mg by mouth daily.  , Disp: , Rfl:  .  clopidogrel (PLAVIX) 75 MG tablet, Take 1 tablet (75 mg total) by mouth daily with breakfast., Disp: 30 tablet, Rfl: 11 .  empagliflozin (JARDIANCE) 10 MG TABS tablet, Take 10 mg by mouth daily., Disp: , Rfl:  .  fluticasone (FLONASE) 50 MCG/ACT nasal spray, Place 2 sprays into both nostrils daily as needed for allergies. , Disp: , Rfl:  .  isosorbide mononitrate (IMDUR) 30 MG 24 hr tablet, Take 0.5 tablets (15 mg total) by mouth daily., Disp: 16 tablet, Rfl: 6 .  lansoprazole (PREVACID) 15 MG capsule, Take 15 mg by mouth daily.  , Disp: , Rfl:  .  lisinopril (ZESTRIL) 20 MG tablet, Take 1 tablet (20 mg total) by mouth daily., Disp: 30 tablet, Rfl: 1 .  metFORMIN (GLUCOPHAGE) 500 MG tablet, Take 1 tablet (500 mg total) by mouth 2 (two) times daily with a meal. (Patient not taking: Reported on 04/24/2020), Disp:  ,  Rfl:  .  metoprolol tartrate 37.5 MG TABS, Take 37.5 mg by mouth 2 (two) times daily., Disp: 90 tablet, Rfl: 6 .  Multiple Minerals-Vitamins (CALCIUM-MAGNESIUM-ZINC-D3) TABS, Take 1 tablet by mouth daily with lunch. , Disp: , Rfl:  .  nitroGLYCERIN (NITROSTAT) 0.4 MG SL tablet, Place 1 tablet (0.4 mg total) under the tongue every 5 (five) minutes as needed., Disp: 25 tablet, Rfl: 12 .  OneTouch Delica Lancets 75Q MISC, 1 each by Other route daily., Disp: , Rfl:  .  ONETOUCH VERIO test strip, 1 each by Other route daily., Disp: , Rfl:  .  Tamsulosin HCl (FLOMAX) 0.4 MG CAPS, Take 0.4 mg by mouth daily after supper. , Disp: , Rfl:   Past Medical History: Past Medical History:  Diagnosis Date  . Arthritis   . Arthritis   . Atrial fibrillation ([place])   . BPH (benign prostatic hypertrophy)   . CAD (coronary artery disease)    stents placed  . Diabetes mellitus   . Diverticulosis   . Dyspnea    when exerting self  . Dysrhythmia    "extra beats"  . Esophageal stricture   . GERD (gastroesophageal reflux disease)   . Headache(784.0)   . Hemorrhoids   . Hernia, inguinal, right   . Hiatal hernia   . Hyperlipidemia   . Hypertension   .  Pneumonia   . Staph infection    Left ankle  . 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 Urinary hesitancy     Tobacco Use: Social History   Tobacco Use  Smoking Status Former Smoker  . Packs/day: 1.00  . Years: 22.00  . Pack years: 22.00  . Types: Cigarettes, Pipe  . Quit date: 12/17/2014  . Years since quitting: 5.5  Smokeless Tobacco Never Used    Labs: Recent Review Flowsheet Data    Labs for ITP Cardiac and Pulmonary Rehab Latest Ref Rng & Units 12/20/2019 12/22/2019 12/23/2019 03/16/2020 03/17/2020   Cholestrol 0 - 200 mg/dL - - - 205(H) -   LDLCALC 0 - 99 mg/dL - - - 137(H) -   HDL >40 mg/dL - - - 47 -   Trlycerides <150 mg/dL - - - 107 -   Hemoglobin A1c 4.8 - 5.6 % - - - 7.2(H) 7.2(H)   PHART 7.35 - 7.45 7.409 - - - -   PCO2ART 32 - 48  mmHg 39.5 - - - -   HCO3 20.0 - 28.0 mmol/L 24.7 - - - -   TCO2 22 - 32 mmol/L 26 - - 26 -   ACIDBASEDEF 0.0 - 2.0 mmol/L - - - - -   O2SAT % 98.0 54.1 72.7 - -       Exercise Target Goals: Exercise Program Goal: Individual exercise prescription set using results from initial 6 min walk test and THRR while considering  patient's activity barriers and safety.   Exercise Prescription Goal: Initial exercise prescription builds to 30-45 minutes a day of aerobic activity, 2-3 days per week.  Home exercise guidelines will be given to patient during program as part of exercise prescription that the participant will acknowledge.   Education: Aerobic Exercise & Resistance Training: - Gives group verbal and written instruction on the various components of exercise. Focuses on aerobic and resistive training programs and the benefits of this training and how to safely progress through these programs..   Education: Exercise & Equipment Safety: - Individual verbal instruction and demonstration of equipment use and safety with use of the equipment.   Cardiac Rehab from 06/17/2020 in [facility] Cardiac and Pulmonary Rehab  Date 05/07/20  Educator [facility]  Instruction Review Code 1- Verbalizes Understanding      Education: Exercise Physiology & General Exercise Guidelines: - Group verbal and written instruction with models to review the exercise physiology of the cardiovascular system and associated critical values. Provides general exercise guidelines with specific guidelines to those with heart or lung disease.    Education: Flexibility, Balance, Mind/Body Relaxation: Provides group verbal/written instruction on the benefits of flexibility and balance training, including mind/body exercise modes such as yoga, pilates and tai chi.  Demonstration and skill practice provided.   Activity Barriers & Risk Stratification:  Activity Barriers & Cardiac Risk Stratification - 04/24/20 1403      Activity Barriers  & Cardiac Risk Stratification   Activity Barriers Back Problems;Arthritis   hx knee surgery   Cardiac Risk Stratification High           6 Minute Walk:  6 Minute Walk    Row Name 05/07/20 1053         6 Minute Walk   Phase Initial     Distance 1230 feet     Walk Time 6 minutes     # of Rest Breaks 0     MPH 2.33     METS 2.83  RPE 7     VO2 Peak 9.91     Symptoms No     Resting HR 58 bpm     Resting BP 114/72     Resting Oxygen Saturation  97 %     Exercise Oxygen Saturation  during 6 min walk 97 %     Max Ex. HR 84 bpm     Max Ex. BP 156/66     2 Minute Post BP 104/58            Oxygen Initial Assessment:   Oxygen Re-Evaluation:   Oxygen Discharge (Final Oxygen Re-Evaluation):   Initial Exercise Prescription:  Initial Exercise Prescription - 05/07/20 1100      Date of Initial Exercise RX and Referring Provider   Date 05/07/20    Referring Provider [place], Peter MD      Treadmill   MPH 2.3    Grade 0.5    Minutes 15    METs 2.92      Recumbant Bike   Level 2    RPM 50    Watts 19    Minutes 15    METs 2      NuStep   Level 1    SPM 80    Minutes 15    METs 2      Arm Ergometer   Level 2    Watts 30    RPM 25    Minutes 15    METs 2      REL-XR   Level 1    Speed 50    Minutes 15    METs 2      T5 Nustep   Level 1    SPM 80    Minutes 15    METs 2      Prescription Details   Frequency (times per week) 2    Duration Progress to 30 minutes of continuous aerobic without signs/symptoms of physical distress      Intensity   THRR 40-80% of Max Heartrate 93-128    Ratings of Perceived Exertion 11-13    Perceived Dyspnea 0-4      Progression   Progression Continue to progress workloads to maintain intensity without signs/symptoms of physical distress.      Resistance Training   Training Prescription Yes    Weight 3 lb    Reps 10-15           Perform Capillary Blood Glucose checks as needed.  Exercise  Prescription Changes:  Exercise Prescription Changes    Row Name 05/07/20 1100 05/19/20 1300 06/02/20 0700 06/16/20 1200       Response to Exercise   Blood Pressure (Admit) 114/72 124/60 126/72 136/74    Blood Pressure (Exercise) 152/66 152/64 130/58 128/70    Blood Pressure (Exit) 104/58 122/76 118/58 142/64    Heart Rate (Admit) 58 bpm 64 bpm 99 bpm 69 bpm    Heart Rate (Exercise) 84 bpm 115 bpm 109 bpm 119 bpm    Heart Rate (Exit) 59 bpm 66 bpm 70 bpm 108 bpm    Oxygen Saturation (Admit) 97 % -- -- --    Oxygen Saturation (Exercise) 97 % -- -- --    Rating of Perceived Exertion (Exercise) '7 13 15 15    '$ Symptoms none none none none    Comments walk test results -- -- --    Duration -- -- Continue with 30 min of aerobic exercise without signs/symptoms of physical distress. Continue with 30 min  of aerobic exercise without signs/symptoms of physical distress.    Intensity -- -- THRR unchanged THRR unchanged      Progression   Progression -- Continue to progress workloads to maintain intensity without signs/symptoms of physical distress. Continue to progress workloads to maintain intensity without signs/symptoms of physical distress. Continue to progress workloads to maintain intensity without signs/symptoms of physical distress.    Average METs -- 2.8 3 3.7      Resistance Training   Training Prescription -- Yes Yes Yes    Weight -- 3 lb 3 lb 3 lb    Reps -- 10-15 10-15 10-15      Interval Training   Interval Training -- No No No      Treadmill   MPH -- 2.3 2.6 2.6    Grade -- 0.'5 2 2    '$ Minutes -- '15 15 15    '$ METs -- 2.92 3.71 3.71      Elliptical   Level -- -- 1 --    Speed -- -- 2.3 --    Minutes -- -- 15 --    METs -- -- 2.4 --      REL-XR   Level -- '1 4 4    '$ Speed -- 50 -- 50    Minutes -- '15 15 15    '$ METs -- 2.6 -- --           Exercise Comments:  Exercise Comments    Row Name 05/11/20 0958           Exercise Comments First full day of exercise!   Patient was oriented to gym and equipment including functions, settings, policies, and procedures.  Patient's individual exercise prescription and treatment plan were reviewed.  All starting workloads were established based on the results of the 6 minute walk test done at initial orientation visit.  The plan for exercise progression was also introduced and progression will be customized based on patient's performance and goals.              Exercise Goals and Review:  Exercise Goals    Row Name 05/07/20 1114             Exercise Goals   Increase Physical Activity Yes       Intervention Provide advice, education, support and counseling about physical activity/exercise needs.;Develop an individualized exercise prescription for aerobic and resistive training based on initial evaluation findings, risk stratification, comorbidities and participant's personal goals.       Expected Outcomes Short Term: Attend rehab on a regular basis to increase amount of physical activity.;Long Term: Add in home exercise to make exercise part of routine and to increase amount of physical activity.;Long Term: Exercising regularly at least 3-5 days a week.       Increase Strength and Stamina Yes       Intervention Provide advice, education, support and counseling about physical activity/exercise needs.;Develop an individualized exercise prescription for aerobic and resistive training based on initial evaluation findings, risk stratification, comorbidities and participant's personal goals.       Expected Outcomes Short Term: Increase workloads from initial exercise prescription for resistance, speed, and METs.;Long Term: Improve cardiorespiratory fitness, muscular endurance and strength as measured by increased METs and functional capacity (6MWT);Short Term: Perform resistance training exercises routinely during rehab and add in resistance training at home       Able to understand and use rate of perceived exertion (RPE)  scale Yes       Intervention  Provide education and explanation on how to use RPE scale       Expected Outcomes Short Term: Able to use RPE daily in rehab to express subjective intensity level;Long Term:  Able to use RPE to guide intensity level when exercising independently       Able to understand and use Dyspnea scale Yes       Intervention Provide education and explanation on how to use Dyspnea scale       Expected Outcomes Short Term: Able to use Dyspnea scale daily in rehab to express subjective sense of shortness of breath during exertion;Long Term: Able to use Dyspnea scale to guide intensity level when exercising independently       Knowledge and understanding of Target Heart Rate Range (THRR) Yes       Intervention Provide education and explanation of THRR including how the numbers were predicted and where they are located for reference       Expected Outcomes Short Term: Able to state/look up THRR;Short Term: Able to use daily as guideline for intensity in rehab;Long Term: Able to use THRR to govern intensity when exercising independently       Able to check pulse independently Yes       Intervention Provide education and demonstration on how to check pulse in carotid and radial arteries.;Review the importance of being able to check your own pulse for safety during independent exercise       Expected Outcomes Short Term: Able to explain why pulse checking is important during independent exercise;Long Term: Able to check pulse independently and accurately       Understanding of Exercise Prescription Yes       Intervention Provide education, explanation, and written materials on patient's individual exercise prescription       Expected Outcomes Short Term: Able to explain program exercise prescription;Long Term: Able to explain home exercise prescription to exercise independently              Exercise Goals Re-Evaluation :  Exercise Goals Re-Evaluation    Row Name 05/11/20 0958 05/19/20  1355 06/02/20 0751 06/16/20 1248       Exercise Goal Re-Evaluation   Exercise Goals Review Increase Physical Activity;Increase Strength and Stamina;Able to understand and use rate of perceived exertion (RPE) scale;Able to understand and use Dyspnea scale;Knowledge and understanding of Target Heart Rate Range (THRR);Understanding of Exercise Prescription -- Increase Physical Activity;Increase Strength and Stamina;Understanding of Exercise Prescription Increase Physical Activity;Increase Strength and Stamina;Understanding of Exercise Prescription    Comments Reviewed RPE and dyspnea scales, THR and program prescription with pt today.  Pt voiced understanding and was given a copy of goals to take home. Laurey Arrow has tolerated exercise well in hs first full week.  Staff will monitor progress. Laurey Arrow is doing well in rehab.  He still needs to be reminded about what to do each day.  We have encouraged him to watch [place] during weights, but he continues to just follow the other patients instead of listening to staff.  He is on level 4 for the XR.  We will continue to monitor his progress. Laurey Arrow has only attended once this month so far. Regular attendance will help him progress.    Expected Outcomes Short: Use RPE daily to regulate intensity. Long: Follow program prescription in THR. Short: attend consistently Long:  improve overall stamina Short: Increase hand weights Long: Continue to improve stamina. Short:  attend consistently Long: increase stamina and MET level  Discharge Exercise Prescription (Final Exercise Prescription Changes):  Exercise Prescription Changes - 06/16/20 1200      Response to Exercise   Blood Pressure (Admit) 136/74    Blood Pressure (Exercise) 128/70    Blood Pressure (Exit) 142/64    Heart Rate (Admit) 69 bpm    Heart Rate (Exercise) 119 bpm    Heart Rate (Exit) 108 bpm    Rating of Perceived Exertion (Exercise) 15    Symptoms none    Duration Continue with 30 min of aerobic  exercise without signs/symptoms of physical distress.    Intensity THRR unchanged      Progression   Progression Continue to progress workloads to maintain intensity without signs/symptoms of physical distress.    Average METs 3.7      Resistance Training   Training Prescription Yes    Weight 3 lb    Reps 10-15      Interval Training   Interval Training No      Treadmill   MPH 2.6    Grade 2    Minutes 15    METs 3.71      REL-XR   Level 4    Speed 50    Minutes 15           Nutrition:  Target Goals: Understanding of nutrition guidelines, daily intake of sodium '1500mg'$ , cholesterol '200mg'$ , calories 30% from fat and 7% or less from saturated fats, daily to have 5 or more servings of fruits and vegetables.  Education: Controlling Sodium/Reading Food Labels -Group verbal and written material supporting the discussion of sodium use in heart healthy nutrition. Review and explanation with models, verbal and written materials for utilization of the food label.   Education: General Nutrition Guidelines/Fats and Fiber: -Group instruction provided by verbal, written material, models and posters to present the general guidelines for heart healthy nutrition. Gives an explanation and review of dietary fats and fiber.   Cardiac Rehab from 06/17/2020 in [facility] Cardiac and Pulmonary Rehab  Date 06/17/20  Educator [facility]   Instruction Review Code 1- Verbalizes Understanding      Biometrics:  Pre Biometrics - 05/07/20 1115      Pre Biometrics   Height 5' 8.8" (1.748 m)    Weight 156 lb 14.4 oz (71.2 kg)    BMI (Calculated) 23.29    Single Leg Stand 5 seconds            Nutrition Therapy Plan and Nutrition Goals:  Nutrition Therapy & Goals - 05/20/20 0825      Nutrition Therapy   Diet heart healthy, low Na    Protein (specify units) 55-60g    Fiber 30 grams    Whole Grain Foods 3 servings    Saturated Fats 12 max. grams    Fruits and Vegetables 5 servings/day    Sodium 1.5  grams      Personal Nutrition Goals   Nutrition Goal ST: none at this time as wife does the cooking and his memory is poor LT: get healthier and be more active (yard work, work around [employer]), lower LDL, increase HDL    Comments Whatever his wife cooks. 3  light meals. cereal (cheerios and raisin bran- whole milk) in morning and sometimes bacon and eggs. L: sandwich (peanut butter and  banana on whole wheat)  or cooked meal (beans, peas, and cornbread, cabbage) D: chicken but will mix it up. Wife is a Librarian, academic at the cancer center. Memory has been worse since heart attack.  Discussed heart healthy eating. Pt takes BG 3x/day, but does not remember what the numbers usually are.      Intervention Plan   Intervention Prescribe, educate and counsel regarding individualized specific dietary modifications aiming towards targeted core components such as weight, hypertension, lipid management, diabetes, heart failure and other comorbidities.;Nutrition handout(s) given to patient.    Expected Outcomes Short Term Goal: Understand basic principles of dietary content, such as calories, fat, sodium, cholesterol and nutrients.;Short Term Goal: A plan has been developed with personal nutrition goals set during dietitian appointment.;Long Term Goal: Adherence to prescribed nutrition plan.           Nutrition Assessments:  Nutrition Assessments - 05/11/20 0927      MEDFICTS Scores   Pre Score 66           MEDIFICTS Score Key:          ?70 Need to make dietary changes          40-70 Heart Healthy Diet         ? 40 Therapeutic Level Cholesterol Diet  Nutrition Goals Re-Evaluation:   Nutrition Goals Discharge (Final Nutrition Goals Re-Evaluation):   Psychosocial: Target Goals: Acknowledge presence or absence of significant depression and/or stress, maximize coping skills, provide positive support system. Participant is able to verbalize types and ability to use techniques and skills needed for  reducing stress and depression.   Education: Depression - Provides group verbal and written instruction on the correlation between heart/lung disease and depressed mood, treatment options, and the stigmas associated with seeking treatment.   Education: Sleep Hygiene -Provides group verbal and written instruction about how sleep can affect your health.  Define sleep hygiene, discuss sleep cycles and impact of sleep habits. Review good sleep hygiene tips.     Education: Stress and Anxiety: - Provides group verbal and written instruction about the health risks of elevated stress and causes of high stress.  Discuss the correlation between heart/lung disease and anxiety and treatment options. Review healthy ways to manage with stress and anxiety.    Initial Review & Psychosocial Screening:  Initial Psych Review & Screening - 04/24/20 1407      Initial Review   Current issues with Current Stress Concerns    Source of Stress Concerns Chronic Illness      Family Dynamics   Good Support System? Yes      Barriers   Psychosocial barriers to participate in program There are no identifiable barriers or psychosocial needs.;The patient should benefit from training in stress management and relaxation.      Screening Interventions   Interventions Encouraged to exercise;To provide support and resources with identified psychosocial needs;Provide feedback about the scores to participant    Expected Outcomes Short Term goal: Utilizing psychosocial counselor, staff and physician to assist with identification of specific Stressors or current issues interfering with healing process. Setting desired goal for each stressor or current issue identified.;Long Term Goal: Stressors or current issues are controlled or eliminated.;Short Term goal: Identification and review with participant of any Quality of Life or Depression concerns found by scoring the questionnaire.;Long Term goal: The participant improves quality  of Life and PHQ9 Scores as seen by post scores and/or verbalization of changes           Quality of Life Scores:   Quality of Life - 05/11/20 0928      Quality of Life   Select Quality of Life      Quality of Life Scores  Health/Function Pre 22.4 %    Socioeconomic Pre 22.58 %    Psych/Spiritual Pre 24 %    Family Pre 20.4 %    GLOBAL Pre 22.47 %          Scores of 19 and below usually indicate a poorer quality of life in these areas.  A difference of  2-3 points is a clinically meaningful difference.  A difference of 2-3 points in the total score of the Quality of Life Index has been associated with significant improvement in overall quality of life, self-image, physical symptoms, and general health in studies assessing change in quality of life.  PHQ-9: Recent Review Flowsheet Data    Depression screen [facility] 2/9 05/11/2020   Decreased Interest 0   Down, Depressed, Hopeless 0   PHQ - 2 Score 0   Altered sleeping 0   Tired, decreased energy 0   Change in appetite 0   Feeling bad or failure about yourself  0   Trouble concentrating 0   Moving slowly or fidgety/restless 0   Suicidal thoughts 0   PHQ-9 Score 0   Difficult doing work/chores Not difficult at all     Interpretation of Total Score  Total Score Depression Severity:  1-4 = Minimal depression, 5-9 = Mild depression, 10-14 = Moderate depression, 15-19 = Moderately severe depression, 20-27 = Severe depression   Psychosocial Evaluation and Intervention:  Psychosocial Evaluation - 04/24/20 1417      Psychosocial Evaluation & Interventions   Comments Ashtan has 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 He and his wife both state that his memory has not been good since the CABG, so she has to help him remember a lot more. He says he doesn't feel too stressed out and that he sleeps very well. He and his wife look forward to him starting cardiac rehab for educaion and exercise.    Expected Outcomes Short:  attend [facility] for exercise and education. Long: develop positive self care habits.    Continue Psychosocial Services  Follow up required by staff           Psychosocial Re-Evaluation:  Psychosocial Re-Evaluation    [place] Name 06/03/20 0804             Psychosocial Re-Evaluation   Current issues with None Identified       Comments Patient reports no issues with their current mental states, sleep, stress, depression or anxiety. Will follow up with patient in a few weeks for any changes.       Expected Outcomes Short: Continue to exercise regularly to support mental health and notify staff of any changes. Long: maintain mental health and well being through teaching of rehab or prescribed medications independently.       Interventions Encouraged to attend Cardiac Rehabilitation for the exercise       Continue Psychosocial Services  Follow up required by staff              Psychosocial Discharge (Final Psychosocial Re-Evaluation):  Psychosocial Re-Evaluation - 06/03/20 1610      Psychosocial Re-Evaluation   Current issues with None Identified    Comments Patient reports no issues with their current mental states, sleep, stress, depression or anxiety. Will follow up with patient in a few weeks for any changes.    Expected Outcomes Short: Continue to exercise regularly to support mental health and notify staff of any changes. Long: maintain mental health and well being through teaching of rehab  or prescribed medications independently.    Interventions Encouraged to attend Cardiac Rehabilitation for the exercise    Continue Psychosocial Services  Follow up required by staff           Vocational Rehabilitation: Provide vocational rehab assistance to qualifying candidates.   Vocational Rehab Evaluation & Intervention:  Vocational Rehab - 04/24/20 1407      Initial Vocational Rehab Evaluation & Intervention   Assessment shows need for Vocational Rehabilitation No            Education: Education Goals: Education classes will be provided on a variety of topics geared toward better understanding of heart health and risk factor modification. Participant will state understanding/return demonstration of topics presented as noted by education test scores.  Learning Barriers/Preferences:  Learning Barriers/Preferences - 04/24/20 1408      Learning Barriers/Preferences   Learning Barriers None    Learning Preferences None           General Cardiac Education Topics:  AED/CPR: - Group verbal and written instruction with the use of models to demonstrate the basic use of the AED with the basic ABC's of resuscitation.   Anatomy & Physiology of the Heart: - Group verbal and written instruction and models provide basic cardiac anatomy and physiology, with the coronary electrical and arterial systems. Review of Valvular disease and Heart Failure   Cardiac Procedures: - Group verbal and written instruction to review commonly prescribed medications for heart disease. Reviews the medication, class of the drug, and side effects. Includes the steps to properly store meds and maintain the prescription regimen. (beta blockers and nitrates)   Cardiac Medications I: - Group verbal and written instruction to review commonly prescribed medications for heart disease. Reviews the medication, class of the drug, and side effects. Includes the steps to properly store meds and maintain the prescription regimen.   Cardiac Medications II: -Group verbal and written instruction to review commonly prescribed medications for heart disease. Reviews the medication, class of the drug, and side effects. (all other drug classes)    Go Sex-Intimacy & Heart Disease, Get SMART - Goal Setting: - Group verbal and written instruction through game format to discuss heart disease and the return to sexual intimacy. Provides group verbal and written material to discuss and apply goal setting  through the application of the S.M.A.R.T. Method.   Other Matters of the Heart: - Provides group verbal, written materials and models to describe Stable Angina and Peripheral Artery. Includes description of the disease process and treatment options available to the cardiac patient.   Infection Prevention: - Provides verbal and written material to individual with discussion of infection control including proper hand washing and proper equipment cleaning during exercise session.   Cardiac Rehab from 06/17/2020 in [facility] Cardiac and Pulmonary Rehab  Date 05/07/20  Educator [facility]  Instruction Review Code 1- Verbalizes Understanding      Falls Prevention: - Provides verbal and written material to individual with discussion of falls prevention and safety.   Cardiac Rehab from 06/17/2020 in [facility] Cardiac and Pulmonary Rehab  Date 05/07/20  Educator [facility]  Instruction Review Code 1- Verbalizes Understanding      Other: -Provides group and verbal instruction on various topics (see comments)   Knowledge Questionnaire Score:  Knowledge Questionnaire Score - 05/11/20 0927      Knowledge Questionnaire Score   Pre Score 21/26 Education focus: Angina, Sex, Heart Failure, PAD, Nutrition, Exercise           Core Components/Risk  Factors/Patient Goals at Admission:  Personal Goals and Risk Factors at Admission - 05/07/20 1115      Core Components/Risk Factors/Patient Goals on Admission    Weight Management Yes;Weight Maintenance    Intervention Weight Management: Develop a combined nutrition and exercise program designed to reach desired caloric intake, while maintaining appropriate intake of nutrient and fiber, sodium and fats, and appropriate energy expenditure required for the weight goal.;Weight Management: Provide education and appropriate resources to help participant work on and attain dietary goals.    Admit Weight 156 lb 14.4 oz (71.2 kg)    Goal Weight: Short Term 156 lb (70.8 kg)    Goal  Weight: Long Term 156 lb (70.8 kg)    Expected Outcomes Short Term: Continue to assess and modify interventions until short term weight is achieved;Long Term: Adherence to nutrition and physical activity/exercise program aimed toward attainment of established weight goal;Weight Maintenance: Understanding of the daily nutrition guidelines, which includes 25-35% calories from fat, 7% or less cal from saturated fats, less than '200mg'$  cholesterol, less than 1.5gm of sodium, & 5 or more servings of fruits and vegetables daily    Diabetes Yes    Intervention Provide education about signs/symptoms and action to take for hypo/hyperglycemia.;Provide education about proper nutrition, including hydration, and aerobic/resistive exercise prescription along with prescribed medications to achieve blood glucose in normal ranges: Fasting glucose 65-99 mg/dL    Expected Outcomes Short Term: Participant verbalizes understanding of the signs/symptoms and immediate care of hyper/hypoglycemia, proper foot care and importance of medication, aerobic/resistive exercise and nutrition plan for blood glucose control.;Long Term: Attainment of HbA1C < 7%.    Hypertension Yes    Intervention Provide education on lifestyle modifcations including regular physical activity/exercise, weight management, moderate sodium restriction and increased consumption of fresh fruit, vegetables, and low fat dairy, alcohol moderation, and smoking cessation.;Monitor prescription use compliance.    Expected Outcomes Short Term: Continued assessment and intervention until BP is < 140/9m HG in hypertensive participants. < 130/886mHG in hypertensive participants with diabetes, heart failure or chronic kidney disease.;Long Term: Maintenance of blood pressure at goal levels.    Lipids Yes    Intervention Provide education and support for participant on nutrition & aerobic/resistive exercise along with prescribed medications to achieve LDL '70mg'$ , HDL >'40mg'$ .     Expected Outcomes Short Term: Participant states understanding of desired cholesterol values and is compliant with medications prescribed. Participant is following exercise prescription and nutrition guidelines.;Long Term: Cholesterol controlled with medications as prescribed, with individualized exercise RX and with personalized nutrition plan. Value goals: LDL < '70mg'$ , HDL > 40 mg.           Education:Diabetes - Individual verbal and written instruction to review signs/symptoms of diabetes, desired ranges of glucose level fasting, after meals and with exercise. Acknowledge that pre and post exercise glucose checks will be done for 3 sessions at entry of program.   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  Discussion of what are risk factors and how they play a role in the disease process.  Review of Cholesterol, Blood Pressure, Diabetes, and BMI and the role they play in your overall health.   Core Components/Risk Factors/Patient Goals Review:   Goals and Risk Factor Review    Row Name 06/03/20  0808             Core Components/Risk Factors/Patient Goals Review   Personal Goals Review Weight Management/Obesity;Lipids;Hypertension;Diabetes       Review Patient has been doing well in the program and has no questions about his medications. He has not been monitoring his blood pressure at home. He checks his sugar at home and has been within his normal range. Informed him why it is important to check his blood pressure at home.       Expected Outcomes Short: check blood pressure at home. Long: maintain blood pressure checks at home independently.              Core Components/Risk Factors/Patient Goals at Discharge (Final Review):   Goals and Risk Factor  Review - 06/03/20 0808      Core Components/Risk Factors/Patient Goals Review   Personal Goals Review Weight Management/Obesity;Lipids;Hypertension;Diabetes    Review Patient has been doing well in the program and has no questions about his medications. He has not been monitoring his blood pressure at home. He checks his sugar at home and has been within his normal range. Informed him why it is important to check his blood pressure at home.    Expected Outcomes Short: check blood pressure at home. Long: maintain blood pressure checks at home independently.           ITP Comments:  ITP Comments    Row Name 04/24/20 1416 05/07/20 1053 05/11/20 0957 05/20/20 0620 06/17/20 1038   ITP Comments Initial telephone orientation completed. Diagnosis can be found in [facility] 4/12. EP orientation scheduled for 5/27 at 11am Completed 6MWT and gym orientation.  Initial ITP created and sent for review to Dr. Emily Filbert, Medical Director. First full day of exercise!  Patient was oriented to gym and equipment including functions, settings, policies, and procedures.  Patient's individual exercise prescription and treatment plan were reviewed.  All starting workloads were established based on the results of the 6 minute walk test done at initial orientation visit.  The plan for exercise progression was also introduced and progression will be customized based on patient's performance and goals. 30 Day review completed. Medical Director ITP review done, changes made as directed, and signed approval by Medical Director. 30 Day review completed. Medical Director ITP review done, changes made as directed, and signed approval by Medical Director.          Comments:

## 2020-06-17 NOTE — Progress Notes (Signed)
PCP is Marton Redwood, MD Referring Provider is Martinique, Kairyn Olmeda M, MD  Chief Complaint  Patient presents with  . 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  It has now completely resolved.  Patient remains very active doing yard work which he enjoys greatly. No symptoms of angina.  He has noted some orthostatic dizziness this summer probably related to some dehydration and he is also taking Flomax.  We discussed strategies about preventing those symptoms. Past Medical History:  Diagnosis Date  . Arthritis   . Arthritis   . Atrial fibrillation ([place])   . BPH (benign prostatic hypertrophy)   . CAD (coronary artery disease)    stents placed  . Diabetes mellitus   . Diverticulosis   . Dyspnea    when exerting self  . Dysrhythmia    "extra beats"  . Esophageal stricture   . GERD (gastroesophageal reflux disease)   . Headache(784.0)   . Hemorrhoids   . Hernia, inguinal, right   . Hiatal hernia   . Hyperlipidemia   . Hypertension   . Pneumonia   . Staph infection    Left ankle  . 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 Urinary hesitancy     Past Surgical History:  Procedure Laterality Date  . ANKLE ARTHROPLASTY  2006  . CARDIOVASCULAR STRESS TEST  01/22/2009   EF 51%, NO ISCHEMIA  . CARDIOVASCULAR STRESS TEST    . COLONOSCOPY    . CORONARY ANGIOPLASTY WITH STENT PLACEMENT  03/2005  . CORONARY ARTERY BYPASS GRAFT N/A 12/20/2019   Procedure: CORONARY ARTERY BYPASS GRAFTING (CABG) times four, using left internal mammary artery and left greater saphenous vein;  Surgeon: Ivin Poot, MD;  Location: [place];  Service: Open Heart Surgery;  Laterality: N/A;  . CORONARY STENT INTERVENTION N/A 03/16/2020   Procedure: CORONARY STENT INTERVENTION;  Surgeon: Martinique, Marianna Cid M, MD;  Location: [place] CV LAB;  Service: Cardiovascular;  Laterality: N/A;  . CORONARY STENT PLACEMENT   03/10/2005   SUCCESSFUL STENTING OF THE MID AND CRUX OF THE RCA  . CORONARY/GRAFT ACUTE MI REVASCULARIZATION N/A 03/16/2020   Procedure: Coronary/Graft Acute MI Revascularization;  Surgeon: Martinique, Miles Leyda M, MD;  Location: [place] CV LAB;  Service: Cardiovascular;  Laterality: N/A;  . ESOPHAGEAL DILATION     multiple procedures  . HERNIA REPAIR  [id number]   umbilical hernia  . INGUINAL HERNIA REPAIR  11/28/2011   Procedure: HERNIA REPAIR INGUINAL ADULT;  Surgeon: Imogene Burn. Georgette Dover, MD;  Location: [place] [facility];  Service: General;  Laterality: Right;  right inguinal hernia repair with mesh  . KNEE SURGERY     Right  . LEFT HEART CATH AND CORONARY ANGIOGRAPHY N/A 12/03/2019   Procedure: LEFT HEART CATH AND CORONARY ANGIOGRAPHY;  Surgeon: Jettie Booze, MD;  Location: [place] CV LAB;  Service: Cardiovascular;  Laterality: N/A;  . LEFT HEART CATH AND CORS/GRAFTS ANGIOGRAPHY N/A 03/16/2020   Procedure: LEFT HEART CATH AND CORS/GRAFTS ANGIOGRAPHY;  Surgeon: Martinique, Breanna Mcdaniel M, MD;  Location: [place] CV [facility];  Service: Cardiovascular;  Laterality: N/A;  . POLYPECTOMY    . TEE WITHOUT CARDIOVERSION N/A 12/20/2019   Procedure: TRANSESOPHAGEAL ECHOCARDIOGRAM (TEE);  Surgeon: Prescott Gum, Collier Salina, MD;  Location: [place];  Service: Open Heart Surgery;  Laterality: N/A;  . TONSILLECTOMY  1958  . UMBILICAL HERNIA REPAIR  11/2006  . US ECHOCARDIOGRAPHY  Family History  Problem Relation Age of Onset  . Heart disease Mother   . Heart disease Sister   . Heart disease Brother   . Heart disease Maternal Grandfather   . Ovarian cancer Paternal Grandmother   . Cancer Paternal Grandmother        cervical  . Colon cancer Neg Hx   . Esophageal cancer Neg Hx   . Stomach cancer Neg Hx     Social History Social History   Tobacco Use  . Smoking status: Former Smoker    Packs/day: 1.00    Years: 22.00    Pack years: 22.00    Types: Cigarettes, Pipe    Quit date: 12/17/2014    Years since quitting:  5.5  . Smokeless tobacco: Never Used  Substance Use Topics  . Alcohol use: No    Alcohol/week: 0.0 standard drinks  . Drug use: No    Current Outpatient Medications  Medication Sig Dispense Refill  . acetaminophen (TYLENOL) 650 MG CR tablet Take 1,300 mg by mouth every 8 (eight) hours as needed for pain.    . Alirocumab (PRALUENT) 150 MG/ML SOAJ Inject 150 mg into the skin every 14 (fourteen) days. 2 pen 11  . aspirin EC 81 MG tablet Take 1 tablet (81 mg total) by mouth daily.    . B Complex-C (B-COMPLEX WITH VITAMIN C) tablet Take 1 tablet by mouth daily with breakfast.     . cetirizine (ZYRTEC) 10 MG tablet Take 10 mg by mouth daily.      . clopidogrel (PLAVIX) 75 MG tablet Take 1 tablet (75 mg total) by mouth daily with breakfast. 30 tablet 11  . fluticasone (FLONASE) 50 MCG/ACT nasal spray Place 2 sprays into both nostrils daily as needed for allergies.     . isosorbide mononitrate (IMDUR) 30 MG 24 hr tablet Take 0.5 tablets (15 mg total) by mouth daily. 16 tablet 6  . lansoprazole (PREVACID) 15 MG capsule Take 15 mg by mouth daily.      Marland Kitchen lisinopril (ZESTRIL) 20 MG tablet Take 1 tablet (20 mg total) by mouth daily. 30 tablet 1  . metFORMIN (GLUCOPHAGE) 500 MG tablet Take 1 tablet (500 mg total) by mouth 2 (two) times daily with a meal.    . metoprolol tartrate 37.5 MG TABS Take 37.5 mg by mouth 2 (two) times daily. 90 tablet 6  . Multiple Minerals-Vitamins (CALCIUM-MAGNESIUM-ZINC-D3) TABS Take 1 tablet by mouth daily with lunch.     . nitroGLYCERIN (NITROSTAT) 0.4 MG SL tablet Place 1 tablet (0.4 mg total) under the tongue every 5 (five) minutes as needed. 25 tablet 12  . OneTouch Delica Lancets 82X MISC 1 each by Other route daily.    Glory Rosebush VERIO test strip 1 each by Other route daily.    . Tamsulosin HCl (FLOMAX) 0.4 MG CAPS Take 0.4 mg by mouth daily after supper.     . empagliflozin (JARDIANCE) 10 MG TABS tablet Take 10 mg by mouth daily.     No current  facility-administered medications for this visit.    Allergies  Allergen Reactions  . Statins Anaphylaxis    Myalgias   . Bismuth Subsalicylate Nausea And Vomiting  . Codeine Nausea And Vomiting  . Kiwi Extract     Makes face red, break out into bumps on body.   . Morphine And Related Nausea And Vomiting  . Altace [Ramipril] Rash    Review of Systems  Patient on Praluent for lipid control due to intolerance  of statin No ankle edema orthopnea or PND. Weight has been stable. Energy has significantly improved now that he is attending the phase 2 cardiac rehab program at [facility].  BP 104/62 (BP Location: Right Arm, Patient Position: Sitting, Cuff Size: Normal)   Pulse 69   Temp 98.1 F (36.7 C) (Temporal)   Resp 20   Ht 5\' 8"  (1.727 m)   Wt 155 lb (70.3 kg)   SpO2 96% Comment: RA  BMI 23.57 kg/m  Physical Exam      Exam    General- alert and comfortable    Neck- no JVD, no cervical adenopathy palpable, no carotid bruit   Lungs- clear without rales, wheezes   Cor- regular rate and rhythm, no murmur , gallop   Abdomen- soft, non-tender   Extremities - warm, non-tender, minimal edema   Neuro- oriented, appropriate, no focal weakness   Diagnostic Tests: None  Impression: Patient doing very well now 6 months after multivessel CABG with postoperative PCI of the circumflex for a closed vein graft by Dr. Martinique.  Plan: He will return here for any surgical issues and continue care with his primary care physician and Dr. Martinique.   Len Childs, MD [facility] [phone]

## 2020-06-17 NOTE — Progress Notes (Signed)
Daily Session Note  Patient Details  Name: [name] MRN: [id number] Date of Birth: May 14, 1946 Referring Provider:     Cardiac Rehab from 05/07/2020 in [facility] Cardiac and Pulmonary Rehab  Referring Provider Martinique, Peter MD      Encounter Date: 06/17/2020  Check In:  Session Check In - 06/17/20 0824      Check-In   Supervising physician immediately available to respond to emergencies See telemetry face sheet for immediately available ER MD    Location [facility]-Cardiac & Pulmonary Rehab    Staff Present Heath Lark, RN, BSN, Lance Sell, BA, ACSM CEP, Exercise Physiologist;Joseph Hood RCP,RRT,BSRT    Virtual Visit No    Medication changes reported     No    Fall or balance concerns reported    No    Warm-up and Cool-down Performed on first and last piece of equipment    Resistance Training Performed Yes    VAD Patient? No    PAD/SET Patient? No      Pain Assessment   Currently in Pain? No/denies              Social History   Tobacco Use  Smoking Status Former Smoker   Packs/day: 1.00   Years: 22.00   Pack years: 22.00   Types: Cigarettes, Pipe   Quit date: 12/17/2014   Years since quitting: 5.5  Smokeless Tobacco Never Used    Goals Met:  Independence with exercise equipment Exercise tolerated well No report of cardiac concerns or symptoms  Goals Unmet:  Not Applicable  Comments: Pt able to follow exercise prescription today without complaint.  Will continue to monitor for progression.    Dr. Emily Filbert is Medical Director for [facility] and [facility].

## 2020-06-22 ENCOUNTER — Other Ambulatory Visit: Payer: Self-pay

## 2020-06-22 ENCOUNTER — Encounter: Payer: BC Managed Care – PPO | Admitting: *Deleted

## 2020-06-22 DIAGNOSIS — I252 Old myocardial infarction: Secondary | ICD-10-CM | POA: Diagnosis not present

## 2020-06-22 DIAGNOSIS — I213 ST elevation (STEMI) myocardial infarction of unspecified site: Secondary | ICD-10-CM

## 2020-06-22 NOTE — Progress Notes (Signed)
Daily Session Note  Patient Details  Name: [name] MRN: [id number] Date of Birth: 08/20/1946 Referring Provider:     Cardiac Rehab from 05/07/2020 in [facility] Cardiac and Pulmonary Rehab  Referring Provider Martinique, Peter MD      Encounter Date: 06/22/2020  Check In:      Social History   Tobacco Use  Smoking Status Former Smoker  . Packs/day: 1.00  . Years: 22.00  . Pack years: 22.00  . Types: Cigarettes, Pipe  . Quit date: 12/17/2014  . Years since quitting: 5.5  Smokeless Tobacco Never Used    Goals Met:  Independence with exercise equipment Exercise tolerated well No report of cardiac concerns or symptoms Strength training completed today  Goals Unmet:  Not Applicable  Comments: Pt able to follow exercise prescription today without complaint.  Will continue to monitor for progression.   Dr. Emily Filbert is Medical Director for [place] and [facility].

## 2020-06-24 ENCOUNTER — Other Ambulatory Visit: Payer: Self-pay

## 2020-06-24 ENCOUNTER — Encounter: Payer: BC Managed Care – PPO | Admitting: *Deleted

## 2020-06-24 DIAGNOSIS — I213 ST elevation (STEMI) myocardial infarction of unspecified site: Secondary | ICD-10-CM

## 2020-06-24 DIAGNOSIS — I252 Old myocardial infarction: Secondary | ICD-10-CM | POA: Diagnosis not present

## 2020-06-24 DIAGNOSIS — Z955 Presence of coronary angioplasty implant and graft: Secondary | ICD-10-CM

## 2020-06-24 NOTE — Progress Notes (Signed)
Daily Session Note  Patient Details  Name: DJIBRIL GLOGOWSKI MRN: [id number] Date of Birth: 06/19/1946 Referring Provider:     Cardiac Rehab from 05/07/2020 in [facility] Cardiac and Pulmonary Rehab  Referring Provider Martinique, Peter MD      Encounter Date: 06/24/2020  Check In:  Session Check In - 06/24/20 0746      Check-In   Supervising physician immediately available to respond to emergencies See telemetry face sheet for immediately available ER MD    Location [facility]-Cardiac & Pulmonary Rehab    Staff Present Renita Papa, RN BSN;Jessica Luan Pulling, MA, RCEP, CCRP, CCET;Amanda Sommer, [place], ACSM CEP, Exercise Physiologist    Virtual Visit No    Medication changes reported     No    Fall or balance concerns reported    No    Warm-up and Cool-down Performed on first and last piece of equipment    Resistance Training Performed Yes    VAD Patient? No    PAD/SET Patient? No      Pain Assessment   Currently in Pain? No/denies              Social History   Tobacco Use  Smoking Status Former Smoker  . Packs/day: 1.00  . Years: 22.00  . Pack years: 22.00  . Types: Cigarettes, Pipe  . Quit date: 12/17/2014  . Years since quitting: 5.5  Smokeless Tobacco Never Used    Goals Met:  Independence with exercise equipment Exercise tolerated well No report of cardiac concerns or symptoms Strength training completed today  Goals Unmet:  Not Applicable  Comments: Pt able to follow exercise prescription today without complaint.  Will continue to monitor for progression.    Dr. Emily Filbert is Medical Director for [place] and [facility].

## 2020-06-26 ENCOUNTER — Other Ambulatory Visit: Payer: Self-pay

## 2020-06-26 ENCOUNTER — Encounter: Payer: BC Managed Care – PPO | Admitting: *Deleted

## 2020-06-26 DIAGNOSIS — I252 Old myocardial infarction: Secondary | ICD-10-CM | POA: Diagnosis not present

## 2020-06-26 DIAGNOSIS — Z955 Presence of coronary angioplasty implant and graft: Secondary | ICD-10-CM

## 2020-06-26 DIAGNOSIS — I213 ST elevation (STEMI) myocardial infarction of unspecified site: Secondary | ICD-10-CM

## 2020-06-26 LAB — LIPID PANEL
Chol/HDL Ratio: 2.1 ratio (ref 0.0–5.0)
Cholesterol, Total: 101 mg/dL (ref 100–199)
HDL: 48 mg/dL (ref >39)
LDL Chol Calc (NIH): 38 mg/dL (ref 0–99)
Triglycerides: 70 mg/dL (ref 0–149)
VLDL Cholesterol Cal: 15 mg/dL (ref 5–40)

## 2020-06-26 LAB — HEPATIC FUNCTION PANEL
ALT: 11 IU/L (ref 0–44)
AST: 16 IU/L (ref 0–40)
Albumin: 4.3 g/dL (ref 3.7–4.7)
Alkaline Phosphatase: 81 IU/L (ref 48–121)
Bilirubin Total: 0.4 mg/dL (ref 0.0–1.2)
Bilirubin, Direct: 0.14 mg/dL (ref 0.00–0.40)
Total Protein: 6.7 g/dL (ref 6.0–8.5)

## 2020-06-26 NOTE — Progress Notes (Signed)
Daily Session Note  Patient Details  Name: CHRISTOS MIXSON MRN: [id number] Date of Birth: 07-Feb-1946 Referring Provider:     Cardiac Rehab from 05/07/2020 in [facility] Cardiac and Pulmonary Rehab  Referring Provider Martinique, Peter MD      Encounter Date: 06/26/2020  Check In:  Session Check In - 06/26/20 0803      Check-In   Supervising physician immediately available to respond to emergencies See telemetry face sheet for immediately available ER MD    Location [facility]-Cardiac & Pulmonary Rehab    Staff Present Heath Lark, RN, BSN, CCRP;Melissa [place] RDN, LDN;Jessica [place], MA, RCEP, CCRP, CCET    Virtual Visit No    Medication changes reported     No    Fall or balance concerns reported    No    Warm-up and Cool-down Performed on first and last piece of equipment    Resistance Training Performed Yes    VAD Patient? No    PAD/SET Patient? No      Pain Assessment   Currently in Pain? No/denies              Social History   Tobacco Use  Smoking Status Former Smoker  . Packs/day: 1.00  . Years: 22.00  . Pack years: 22.00  . Types: Cigarettes, Pipe  . Quit date: 12/17/2014  . Years since quitting: 5.5  Smokeless Tobacco Never Used    Goals Met:  Independence with exercise equipment Exercise tolerated well No report of cardiac concerns or symptoms  Goals Unmet:  Not Applicable  Comments: Pt able to follow exercise prescription today without complaint.  Will continue to monitor for progression.    Dr. Emily Filbert is Medical Director for [place] and [facility].

## 2020-06-29 ENCOUNTER — Other Ambulatory Visit: Payer: Self-pay

## 2020-06-29 ENCOUNTER — Encounter: Payer: BC Managed Care – PPO | Admitting: *Deleted

## 2020-06-29 DIAGNOSIS — Z955 Presence of coronary angioplasty implant and graft: Secondary | ICD-10-CM

## 2020-06-29 DIAGNOSIS — I252 Old myocardial infarction: Secondary | ICD-10-CM | POA: Diagnosis not present

## 2020-06-29 DIAGNOSIS — I213 ST elevation (STEMI) myocardial infarction of unspecified site: Secondary | ICD-10-CM

## 2020-06-29 NOTE — Progress Notes (Signed)
Daily Session Note  Patient Details  Name: [name] MRN: [id number] Date of Birth: January 13, 1946 Referring Provider:     Cardiac Rehab from 05/07/2020 in [facility] Cardiac and Pulmonary Rehab  Referring Provider Martin[place], Peter MD      Encounter Date: 06/29/2020  Check In:  Session Check In - 06/29/20 0808      Check-In   Supervising physician immediately available to respond to emergencies See telemetry face sheet for immediately available ER MD    Location [facility]-Cardiac & Pulmonary Rehab    Staff Present Heath Lark, RN, BSN, Laveda Norman, BS, ACSM CEP, Exercise Physiologist;Joseph Tessie Fass RCP,RRT,BSRT    Virtual Visit No    Medication changes reported     No    Fall or balance concerns reported    No    Warm-up and Cool-down Performed on first and last piece of equipment    Resistance Training Performed Yes    VAD Patient? No    PAD/SET Patient? No      Pain Assessment   Currently in Pain? No/denies              Social History   Tobacco Use  Smoking Status Former Smoker  . Packs/day: 1.00  . Years: 22.00  . Pack years: 22.00  . Types: Cigarettes, Pipe  . Quit date: 12/17/2014  . Years since quitting: 5.5  Smokeless Tobacco Never Used    Goals Met:  Independence with exercise equipment Exercise tolerated well No report of cardiac concerns or symptoms  Goals Unmet:  Not Applicable  Comments: Pt able to follow exercise prescription today without complaint.  Will continue to monitor for progression.    Dr. Emily Filbert is Medical Director for [place] and [facility].

## 2020-07-01 ENCOUNTER — Encounter: Payer: BC Managed Care – PPO | Admitting: *Deleted

## 2020-07-01 ENCOUNTER — Other Ambulatory Visit: Payer: Self-pay

## 2020-07-01 DIAGNOSIS — I213 ST elevation (STEMI) myocardial infarction of unspecified site: Secondary | ICD-10-CM

## 2020-07-01 DIAGNOSIS — Z955 Presence of coronary angioplasty implant and graft: Secondary | ICD-10-CM

## 2020-07-01 DIAGNOSIS — I252 Old myocardial infarction: Secondary | ICD-10-CM | POA: Diagnosis not present

## 2020-07-01 NOTE — Progress Notes (Signed)
Daily Session Note  Patient Details  Name: [name] MRN: [id number] Date of Birth: [birth year] Referring Provider:     Cardiac Rehab from 05/07/2020 in [facility] Cardiac and Pulmonary Rehab  Referring Provider [place], Peter MD      Encounter Date: 07/01/2020  Check In:  Session Check In - 07/01/20 0755      Check-In   Supervising physician immediately available to respond to emergencies See telemetry face sheet for immediately available ER MD    Location [facility]-Cardiac & Pulmonary Rehab    Staff Present Heath Lark, RN, BSN, CCRP;Meredith Sherryll Burger, RN BSN;Melissa Caiola RDN, Rowe Pavy, BA, ACSM CEP, Exercise Physiologist    Virtual Visit No    Medication changes reported     No    Fall or balance concerns reported    No    Warm-up and Cool-down Performed on first and last piece of equipment    Resistance Training Performed Yes    VAD Patient? No    PAD/SET Patient? No      Pain Assessment   Currently in Pain? No/denies              Social History   Tobacco Use  Smoking Status Former Smoker  . Packs/day: 1.00  . Years: 22.00  . Pack years: 22.00  . Types: Cigarettes, Pipe  . Quit date: 12/17/2014  . Years since quitting: 5.5  Smokeless Tobacco Never Used    Goals Met:  Independence with exercise equipment Exercise tolerated well No report of cardiac concerns or symptoms  Goals Unmet:  Not Applicable  Comments: Pt able to follow exercise prescription today without complaint.  Will continue to monitor for progression.    Dr. Emily Filbert is Medical Director for [place] and [facility].

## 2020-07-02 ENCOUNTER — Other Ambulatory Visit: Payer: Self-pay | Admitting: Physician Assistant

## 2020-07-03 ENCOUNTER — Other Ambulatory Visit: Payer: Self-pay

## 2020-07-03 ENCOUNTER — Encounter: Payer: BC Managed Care – PPO | Admitting: *Deleted

## 2020-07-03 DIAGNOSIS — I252 Old myocardial infarction: Secondary | ICD-10-CM | POA: Diagnosis not present

## 2020-07-03 DIAGNOSIS — I213 ST elevation (STEMI) myocardial infarction of unspecified site: Secondary | ICD-10-CM

## 2020-07-03 DIAGNOSIS — Z955 Presence of coronary angioplasty implant and graft: Secondary | ICD-10-CM

## 2020-07-03 NOTE — Progress Notes (Signed)
Daily Session Note  Patient Details  Name: [name] MRN: [id number] Date of Birth: [birth year] Referring Provider:     Cardiac Rehab from 05/07/2020 in [facility] Cardiac and Pulmonary Rehab  Referring Provider [place], Peter MD      Encounter Date: 07/03/2020  Check In:  Session Check In - 07/03/20 0801      Check-In   Supervising physician immediately available to respond to emergencies See telemetry face sheet for immediately available ER MD    Location [facility]-Cardiac & Pulmonary Rehab    Staff Present Heath Lark, RN, BSN, CCRP;Jessica [place], MA, RCEP, CCRP, CCET;Joseph [place] RCP,RRT,BSRT    Virtual Visit No    Medication changes reported     No    Fall or balance concerns reported    No    Warm-up and Cool-down Performed on first and last piece of equipment    Resistance Training Performed Yes    VAD Patient? No    PAD/SET Patient? No      Pain Assessment   Currently in Pain? No/denies              Social History   Tobacco Use  Smoking Status Former Smoker  . Packs/day: 1.00  . Years: 22.00  . Pack years: 22.00  . Types: Cigarettes, Pipe  . Quit date: 12/17/2014  . Years since quitting: 5.5  Smokeless Tobacco Never Used    Goals Met:  Independence with exercise equipment Exercise tolerated well No report of cardiac concerns or symptoms  Goals Unmet:  Not Applicable  Comments: Pt able to follow exercise prescription today without complaint.  Will continue to monitor for progression.    Dr. Emily Filbert is Medical Director for [place] and [facility].

## 2020-07-06 ENCOUNTER — Other Ambulatory Visit: Payer: Self-pay

## 2020-07-06 ENCOUNTER — Encounter: Payer: BC Managed Care – PPO | Attending: Cardiology | Admitting: *Deleted

## 2020-07-06 DIAGNOSIS — I213 ST elevation (STEMI) myocardial infarction of unspecified site: Secondary | ICD-10-CM

## 2020-07-06 DIAGNOSIS — I252 Old myocardial infarction: Secondary | ICD-10-CM | POA: Diagnosis not present

## 2020-07-06 DIAGNOSIS — Z955 Presence of coronary angioplasty implant and graft: Secondary | ICD-10-CM | POA: Insufficient documentation

## 2020-07-06 NOTE — Progress Notes (Signed)
Daily Session Note  Patient Details  Name: [name] MRN: [id number] Date of Birth: 1946/03/28 Referring Provider:     Cardiac Rehab from 05/07/2020 in [facility] Cardiac and Pulmonary Rehab  Referring Provider Martin[place], Peter MD      Encounter Date: 07/06/2020  Check In:  Session Check In - 07/06/20 0845      Check-In   Supervising physician immediately available to respond to emergencies See telemetry face sheet for immediately available ER MD    Location [facility]-Cardiac & Pulmonary Rehab    Staff Present Heath Lark, RN, BSN, Laveda Norman, BS, ACSM CEP, Exercise Physiologist;Joseph Tessie Fass RCP,RRT,BSRT    Virtual Visit No    Medication changes reported     No    Fall or balance concerns reported    No    Warm-up and Cool-down Performed on first and last piece of equipment    Resistance Training Performed Yes    VAD Patient? No    PAD/SET Patient? No      Pain Assessment   Currently in Pain? No/denies              Social History   Tobacco Use  Smoking Status Former Smoker  . Packs/day: 1.00  . Years: 22.00  . Pack years: 22.00  . Types: Cigarettes, Pipe  . Quit date: 12/17/2014  . Years since quitting: 5.5  Smokeless Tobacco Never Used    Goals Met:  Independence with exercise equipment Exercise tolerated well No report of cardiac concerns or symptoms  Goals Unmet:  Not Applicable  Comments: Pt able to follow exercise prescription today without complaint.  Will continue to monitor for progression.    Dr. Emily Filbert is Medical Director for [place] and [facility].

## 2020-07-08 ENCOUNTER — Other Ambulatory Visit: Payer: Self-pay

## 2020-07-08 ENCOUNTER — Encounter: Payer: BC Managed Care – PPO | Admitting: *Deleted

## 2020-07-08 DIAGNOSIS — I252 Old myocardial infarction: Secondary | ICD-10-CM | POA: Diagnosis not present

## 2020-07-08 DIAGNOSIS — I213 ST elevation (STEMI) myocardial infarction of unspecified site: Secondary | ICD-10-CM

## 2020-07-08 DIAGNOSIS — Z955 Presence of coronary angioplasty implant and graft: Secondary | ICD-10-CM

## 2020-07-08 NOTE — Progress Notes (Signed)
Daily Session Note  Patient Details  Name: TEIGEN BELLIN MRN: [id number] Date of Birth: 1946-10-13 Referring Provider:     Cardiac Rehab from 05/07/2020 in [facility] Cardiac and Pulmonary Rehab  Referring Provider [place], Peter MD      Encounter Date: 07/08/2020  Check In:  Session Check In - 07/08/20 8466      Check-In   Supervising physician immediately available to respond to emergencies See telemetry face sheet for immediately available ER MD    Location [facility]-Cardiac & Pulmonary Rehab    Staff Present Heath Lark, RN, BSN, CCRP;Melissa Belk RDN, LDN;Joseph [employer], [place], ACSM CEP, Exercise Physiologist    Virtual Visit No    Medication changes reported     No    Fall or balance concerns reported    No    Warm-up and Cool-down Performed on first and last piece of equipment    Resistance Training Performed Yes    VAD Patient? No    PAD/SET Patient? No      Pain Assessment   Currently in Pain? No/denies              Social History   Tobacco Use  Smoking Status Former Smoker  . Packs/day: 1.00  . Years: 22.00  . Pack years: 22.00  . Types: Cigarettes, Pipe  . Quit date: 12/17/2014  . Years since quitting: 5.5  Smokeless Tobacco Never Used    Goals Met:  Independence with exercise equipment Exercise tolerated well No report of cardiac concerns or symptoms  Goals Unmet:  Not Applicable  Comments: Pt able to follow exercise prescription today without complaint.  Will continue to monitor for progression.    Dr. Emily Filbert is Medical Director for [place] and [facility].

## 2020-07-10 ENCOUNTER — Other Ambulatory Visit: Payer: Self-pay

## 2020-07-10 ENCOUNTER — Encounter: Payer: BC Managed Care – PPO | Admitting: *Deleted

## 2020-07-10 DIAGNOSIS — I252 Old myocardial infarction: Secondary | ICD-10-CM | POA: Diagnosis not present

## 2020-07-10 DIAGNOSIS — Z955 Presence of coronary angioplasty implant and graft: Secondary | ICD-10-CM

## 2020-07-10 DIAGNOSIS — I213 ST elevation (STEMI) myocardial infarction of unspecified site: Secondary | ICD-10-CM

## 2020-07-10 NOTE — Progress Notes (Signed)
Daily Session Note  Patient Details  Name: [name] MRN: [id number] Date of Birth: 06/15/1946 Referring Provider:     Cardiac Rehab from 05/07/2020 in [facility] Cardiac and Pulmonary Rehab  Referring Provider [place], Peter MD      Encounter Date: 07/10/2020  Check In:  Session Check In - 07/10/20 0842      Check-In   Supervising physician immediately available to respond to emergencies See telemetry face sheet for immediately available ER MD    Location [facility]-Cardiac & Pulmonary Rehab    Staff Present  , RN, BSN, CCRP;Kara Langdon, MS Exercise Physiologist;Joseph [place] RCP,RRT,BSRT    Virtual Visit No    Medication changes reported     No    Fall or balance concerns reported    No    Warm-up and Cool-down Performed on first and last piece of equipment    Resistance Training Performed Yes    VAD Patient? No    PAD/SET Patient? No      Pain Assessment   Currently in Pain? No/denies              Social History   Tobacco Use  Smoking Status Former Smoker  . Packs/day: 1.00  . Years: 22.00  . Pack years: 22.00  . Types: Cigarettes, Pipe  . Quit date: 12/17/2014  . Years since quitting: 5.5  Smokeless Tobacco Never Used    Goals Met:  Independence with exercise equipment Exercise tolerated well No report of cardiac concerns or symptoms  Goals Unmet:  Not Applicable  Comments: Pt able to follow exercise prescription today without complaint.  Will continue to monitor for progression.    Dr. Mark Miller is Medical Director for [facility] and [facility]. 

## 2020-07-13 ENCOUNTER — Encounter: Payer: BC Managed Care – PPO | Admitting: *Deleted

## 2020-07-13 ENCOUNTER — Other Ambulatory Visit: Payer: Self-pay

## 2020-07-13 DIAGNOSIS — Z955 Presence of coronary angioplasty implant and graft: Secondary | ICD-10-CM

## 2020-07-13 DIAGNOSIS — I213 ST elevation (STEMI) myocardial infarction of unspecified site: Secondary | ICD-10-CM

## 2020-07-13 DIAGNOSIS — I252 Old myocardial infarction: Secondary | ICD-10-CM | POA: Diagnosis not present

## 2020-07-13 NOTE — Progress Notes (Signed)
Daily Session Note  Patient Details  Name: [name] MRN: [id number] Date of Birth: July 16, 1946 Referring Provider:     Cardiac Rehab from 05/07/2020 in [facility] Cardiac and Pulmonary Rehab  Referring Provider [place], Peter MD      Encounter Date: 07/13/2020  Check In:  Session Check In - 07/13/20 0801      Check-In   Supervising physician immediately available to respond to emergencies See telemetry face sheet for immediately available ER MD    Location [facility]-Cardiac & Pulmonary Rehab    Staff Present Heath Lark, RN, BSN, Laveda Norman, BS, ACSM CEP, Exercise Physiologist;Joseph Tessie Fass RCP,RRT,BSRT    Virtual Visit No    Medication changes reported     No    Fall or balance concerns reported    No    Warm-up and Cool-down Performed on first and last piece of equipment    Resistance Training Performed Yes    VAD Patient? No    PAD/SET Patient? No      Pain Assessment   Currently in Pain? No/denies              Social History   Tobacco Use  Smoking Status Former Smoker  . Packs/day: 1.00  . Years: 22.00  . Pack years: 22.00  . Types: Cigarettes, Pipe  . Quit date: 12/17/2014  . Years since quitting: 5.5  Smokeless Tobacco Never Used    Goals Met:  Independence with exercise equipment Exercise tolerated well No report of cardiac concerns or symptoms  Goals Unmet:  Not Applicable  Comments: Pt able to follow exercise prescription today without complaint.  Will continue to monitor for progression.    Dr. Emily Filbert is Medical Director for [place] and [facility].

## 2020-07-15 ENCOUNTER — Encounter: Payer: Self-pay | Admitting: *Deleted

## 2020-07-15 ENCOUNTER — Encounter: Payer: BC Managed Care – PPO | Admitting: *Deleted

## 2020-07-15 ENCOUNTER — Other Ambulatory Visit: Payer: Self-pay

## 2020-07-15 DIAGNOSIS — Z955 Presence of coronary angioplasty implant and graft: Secondary | ICD-10-CM

## 2020-07-15 DIAGNOSIS — I252 Old myocardial infarction: Secondary | ICD-10-CM | POA: Diagnosis not present

## 2020-07-15 DIAGNOSIS — I213 ST elevation (STEMI) myocardial infarction of unspecified site: Secondary | ICD-10-CM

## 2020-07-15 NOTE — Progress Notes (Signed)
Cardiac Individual Treatment Plan  Patient Details  Name: [name] MRN: [id number] Date of Birth: [birth year] Referring Provider:     Cardiac Rehab from 05/07/2020 in [facility] Cardiac and Pulmonary Rehab  Referring Provider Martinique, Peter MD      Initial Encounter Date:    Cardiac Rehab from 05/07/2020 in [facility] Cardiac and Pulmonary Rehab  Date 05/07/20      Visit Diagnosis: ST elevation myocardial infarction (STEMI), unspecified artery [facility])  Status post coronary artery stent placement  Patient's Home Medications on Admission:  Current Outpatient Medications:  .  acetaminophen (TYLENOL) 650 MG CR tablet, Take 1,300 mg by mouth every 8 (eight) hours as needed for pain., Disp: , Rfl:  .  Alirocumab (PRALUENT) 150 MG/ML SOAJ, Inject 150 mg into the skin every 14 (fourteen) days., Disp: 2 pen, Rfl: 11 .  aspirin EC 81 MG tablet, Take 1 tablet (81 mg total) by mouth daily., Disp:  , Rfl:  .  B Complex-C (B-COMPLEX WITH VITAMIN C) tablet, Take 1 tablet by mouth daily with breakfast. , Disp: , Rfl:  .  cetirizine (ZYRTEC) 10 MG tablet, Take 10 mg by mouth daily.  , Disp: , Rfl:  .  clopidogrel (PLAVIX) 75 MG tablet, Take 1 tablet (75 mg total) by mouth daily with breakfast., Disp: 30 tablet, Rfl: 11 .  empagliflozin (JARDIANCE) 10 MG TABS tablet, Take 10 mg by mouth daily., Disp: , Rfl:  .  fluticasone (FLONASE) 50 MCG/ACT nasal spray, Place 2 sprays into both nostrils daily as needed for allergies. , Disp: , Rfl:  .  isosorbide mononitrate (IMDUR) 30 MG 24 hr tablet, Take 0.5 tablets (15 mg total) by mouth daily., Disp: 16 tablet, Rfl: 6 .  lansoprazole (PREVACID) 15 MG capsule, Take 15 mg by mouth daily.  , Disp: , Rfl:  .  lisinopril (ZESTRIL) 20 MG tablet, Take 1 tablet (20 mg total) by mouth daily., Disp: 30 tablet, Rfl: 1 .  metFORMIN (GLUCOPHAGE) 500 MG tablet, Take 1 tablet (500 mg total) by mouth 2 (two) times daily with a meal., Disp:  , Rfl:  .  metoprolol tartrate 37.5 MG TABS,  Take 37.5 mg by mouth 2 (two) times daily., Disp: 90 tablet, Rfl: 6 .  Multiple Minerals-Vitamins (CALCIUM-MAGNESIUM-ZINC-D3) TABS, Take 1 tablet by mouth daily with lunch. , Disp: , Rfl:  .  nitroGLYCERIN (NITROSTAT) 0.4 MG SL tablet, Place 1 tablet (0.4 mg total) under the tongue every 5 (five) minutes as needed., Disp: 25 tablet, Rfl: 12 .  OneTouch Delica Lancets 08Q MISC, 1 each by Other route daily., Disp: , Rfl:  .  ONETOUCH VERIO test strip, 1 each by Other route daily., Disp: , Rfl:  .  Tamsulosin HCl (FLOMAX) 0.4 MG CAPS, Take 0.4 mg by mouth daily after supper. , Disp: , Rfl:   Past Medical History: Past Medical History:  Diagnosis Date  . Arthritis   . Arthritis   . Atrial fibrillation ([place])   . BPH (benign prostatic hypertrophy)   . CAD (coronary artery disease)    stents placed  . Diabetes mellitus   . Diverticulosis   . Dyspnea    when exerting self  . Dysrhythmia    "extra beats"  . Esophageal stricture   . GERD (gastroesophageal reflux disease)   . Headache(784.0)   . Hemorrhoids   . Hernia, inguinal, right   . Hiatal hernia   . Hyperlipidemia   . Hypertension   . Pneumonia   . Staph infection  Left ankle  . 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 Urinary hesitancy     Tobacco Use: Social History   Tobacco Use  Smoking Status Former Smoker  . Packs/day: 1.00  . Years: 22.00  . Pack years: 22.00  . Types: Cigarettes, Pipe  . Quit date: 12/17/2014  . Years since quitting: 5.5  Smokeless Tobacco Never Used    Labs: Recent Review Flowsheet Data    Labs for ITP Cardiac and Pulmonary Rehab Latest Ref Rng & Units 12/22/2019 12/23/2019 03/16/2020 03/17/2020 06/25/2020   Cholestrol 100 - 199 mg/dL - - 205(H) - 101   LDLCALC 0 - 99 mg/dL - - 137(H) - 38   HDL >39 mg/dL - - 47 - 48   Trlycerides 0 - 149 mg/dL - - 107 - 70   Hemoglobin A1c 4.8 - 5.6 % - - 7.2(H) 7.2(H) -   PHART 7.35 - 7.45 - - - - -   PCO2ART 32 - 48 mmHg - - - - -   HCO3 20.0 - 28.0  mmol/L - - - - -   TCO2 22 - 32 mmol/L - - 26 - -   ACIDBASEDEF 0.0 - 2.0 mmol/L - - - - -   O2SAT % 54.1 72.7 - - -       Exercise Target Goals: Exercise Program Goal: Individual exercise prescription set using results from initial 6 min walk test and THRR while considering  patient's activity barriers and safety.   Exercise Prescription Goal: Initial exercise prescription builds to 30-45 minutes a day of aerobic activity, 2-3 days per week.  Home exercise guidelines will be given to patient during program as part of exercise prescription that the participant will acknowledge.   Education: Aerobic Exercise & Resistance Training: - Gives group verbal and written instruction on the various components of exercise. Focuses on aerobic and resistive training programs and the benefits of this training and how to safely progress through these programs..   Education: Exercise & Equipment Safety: - Individual verbal instruction and demonstration of equipment use and safety with use of the equipment.   Cardiac Rehab from 07/08/2020 in [facility] Cardiac and Pulmonary Rehab  Date 05/07/20  Educator [facility]  Instruction Review Code 1- Verbalizes Understanding      Education: Exercise Physiology & General Exercise Guidelines: - Group verbal and written instruction with models to review the exercise physiology of the cardiovascular system and associated critical values. Provides general exercise guidelines with specific guidelines to those with heart or lung disease.    Education: Flexibility, Balance, Mind/Body Relaxation: Provides group verbal/written instruction on the benefits of flexibility and balance training, including mind/body exercise modes such as yoga, pilates and tai chi.  Demonstration and skill practice provided.   Activity Barriers & Risk Stratification:  Activity Barriers & Cardiac Risk Stratification - 04/24/20 1403      Activity Barriers & Cardiac Risk Stratification   Activity  Barriers Back Problems;Arthritis   hx knee surgery   Cardiac Risk Stratification High           6 Minute Walk:  6 Minute Walk    Row Name 05/07/20 1053         6 Minute Walk   Phase Initial     Distance 1230 feet     Walk Time 6 minutes     # of Rest Breaks 0     MPH 2.33     METS 2.83     RPE 7  VO2 Peak 9.91     Symptoms No     Resting HR 58 bpm     Resting BP 114/72     Resting Oxygen Saturation  97 %     Exercise Oxygen Saturation  during 6 min walk 97 %     Max Ex. HR 84 bpm     Max Ex. BP 156/66     2 Minute Post BP 104/58            Oxygen Initial Assessment:   Oxygen Re-Evaluation:   Oxygen Discharge (Final Oxygen Re-Evaluation):   Initial Exercise Prescription:  Initial Exercise Prescription - 05/07/20 1100      Date of Initial Exercise RX and Referring Provider   Date 05/07/20    Referring Provider Martinique, Peter MD      Treadmill   MPH 2.3    Grade 0.5    Minutes 15    METs 2.92      Recumbant Bike   Level 2    RPM 50    Watts 19    Minutes 15    METs 2      NuStep   Level 1    SPM 80    Minutes 15    METs 2      Arm Ergometer   Level 2    Watts 30    RPM 25    Minutes 15    METs 2      REL-XR   Level 1    Speed 50    Minutes 15    METs 2      T5 Nustep   Level 1    SPM 80    Minutes 15    METs 2      Prescription Details   Frequency (times per week) 2    Duration Progress to 30 minutes of continuous aerobic without signs/symptoms of physical distress      Intensity   THRR 40-80% of Max Heartrate 93-128    Ratings of Perceived Exertion 11-13    Perceived Dyspnea 0-4      Progression   Progression Continue to progress workloads to maintain intensity without signs/symptoms of physical distress.      Resistance Training   Training Prescription Yes    Weight 3 lb    Reps 10-15           Perform Capillary Blood Glucose checks as needed.  Exercise Prescription Changes:  Exercise Prescription  Changes    Row Name 05/07/20 1100 05/19/20 1300 06/02/20 0700 06/16/20 1200 06/30/20 1300     Response to Exercise   Blood Pressure (Admit) 114/72 124/60 126/72 136/74 122/62   Blood Pressure (Exercise) 152/66 152/64 130/58 128/70 122/70   Blood Pressure (Exit) 104/58 122/76 118/58 142/64 122/60   Heart Rate (Admit) 58 bpm 64 bpm 99 bpm 69 bpm 100 bpm   Heart Rate (Exercise) 84 bpm 115 bpm 109 bpm 119 bpm 119 bpm   Heart Rate (Exit) 59 bpm 66 bpm 70 bpm 108 bpm 75 bpm   Oxygen Saturation (Admit) 97 % -- -- -- --   Oxygen Saturation (Exercise) 97 % -- -- -- --   Rating of Perceived Exertion (Exercise) '7 13 15 15 13   ' Symptoms none none none none none   Comments walk test results -- -- -- --   Duration -- -- Continue with 30 min of aerobic exercise without signs/symptoms of physical distress. Continue with 30 min of aerobic exercise  without signs/symptoms of physical distress. Continue with 30 min of aerobic exercise without signs/symptoms of physical distress.   Intensity -- -- THRR unchanged THRR unchanged THRR unchanged     Progression   Progression -- Continue to progress workloads to maintain intensity without signs/symptoms of physical distress. Continue to progress workloads to maintain intensity without signs/symptoms of physical distress. Continue to progress workloads to maintain intensity without signs/symptoms of physical distress. Continue to progress workloads to maintain intensity without signs/symptoms of physical distress.   Average METs -- 2.8 3 3.7 3.24     Resistance Training   Training Prescription -- Yes Yes Yes Yes   Weight -- 3 lb 3 lb 3 lb 4 lb   Reps -- 10-15 10-15 10-15 10-15     Interval Training   Interval Training -- No No No No     Treadmill   MPH -- 2.3 2.6 2.6 2.6   Grade -- 0.'5 2 2 2   ' Minutes -- '15 15 15 15   ' METs -- 2.92 3.71 3.71 3.71     Recumbant Bike   Level -- -- -- -- 3   Watts -- -- -- -- 26   Minutes -- -- -- -- 15   METs -- -- -- --  3.21     NuStep   Level -- -- -- -- 3   Minutes -- -- -- -- 15   METs -- -- -- -- 2.9     Elliptical   Level -- -- 1 -- --   Speed -- -- 2.3 -- --   Minutes -- -- 15 -- --   METs -- -- 2.4 -- --     REL-XR   Level -- '1 4 4 4   ' Speed -- 50 -- 50 --   Minutes -- '15 15 15 15   ' METs -- 2.6 -- -- --   Row Name 07/14/20 1300             Response to Exercise   Blood Pressure (Admit) 110/60       Blood Pressure (Exercise) 142/80       Blood Pressure (Exit) 126/64       Heart Rate (Admit) 56 bpm       Heart Rate (Exercise) 105 bpm       Heart Rate (Exit) 63 bpm       Rating of Perceived Exertion (Exercise) 14       Symptoms none       Duration Continue with 30 min of aerobic exercise without signs/symptoms of physical distress.       Intensity THRR unchanged         Progression   Progression Continue to progress workloads to maintain intensity without signs/symptoms of physical distress.       Average METs 3.5         Resistance Training   Training Prescription Yes       Weight 4 lb       Reps 10-15         Interval Training   Interval Training No         Recumbant Bike   Level 3       RPM 50       Minutes 15       METs 2.85         NuStep   Level 5       SPM 80       Minutes 15  METs 4.1              Exercise Comments:  Exercise Comments    Row Name 05/11/20 [phone]           Exercise Comments First full day of exercise!  Patient was oriented to gym and equipment including functions, settings, policies, and procedures.  Patient's individual exercise prescription and treatment plan were reviewed.  All starting workloads were established based on the results of the 6 minute walk test done at initial orientation visit.  The plan for exercise progression was also introduced and progression will be customized based on patient's performance and goals.              Exercise Goals and Review:  Exercise Goals    Row Name 05/07/20 1114              Exercise Goals   Increase Physical Activity Yes       Intervention Provide advice, education, support and counseling about physical activity/exercise needs.;Develop an individualized exercise prescription for aerobic and resistive training based on initial evaluation findings, risk stratification, comorbidities and participant's personal goals.       Expected Outcomes Short Term: Attend rehab on a regular basis to increase amount of physical activity.;Long Term: Add in home exercise to make exercise part of routine and to increase amount of physical activity.;Long Term: Exercising regularly at least 3-5 days a week.       Increase Strength and Stamina Yes       Intervention Provide advice, education, support and counseling about physical activity/exercise needs.;Develop an individualized exercise prescription for aerobic and resistive training based on initial evaluation findings, risk stratification, comorbidities and participant's personal goals.       Expected Outcomes Short Term: Increase workloads from initial exercise prescription for resistance, speed, and METs.;Long Term: Improve cardiorespiratory fitness, muscular endurance and strength as measured by increased METs and functional capacity (6MWT);Short Term: Perform resistance training exercises routinely during rehab and add in resistance training at home       Able to understand and use rate of perceived exertion (RPE) scale Yes       Intervention Provide education and explanation on how to use RPE scale       Expected Outcomes Short Term: Able to use RPE daily in rehab to express subjective intensity level;Long Term:  Able to use RPE to guide intensity level when exercising independently       Able to understand and use Dyspnea scale Yes       Intervention Provide education and explanation on how to use Dyspnea scale       Expected Outcomes Short Term: Able to use Dyspnea scale daily in rehab to express subjective sense of shortness of breath  during exertion;Long Term: Able to use Dyspnea scale to guide intensity level when exercising independently       Knowledge and understanding of Target Heart Rate Range (THRR) Yes       Intervention Provide education and explanation of THRR including how the numbers were predicted and where they are located for reference       Expected Outcomes Short Term: Able to state/look up THRR;Short Term: Able to use daily as guideline for intensity in rehab;Long Term: Able to use THRR to govern intensity when exercising independently       Able to check pulse independently Yes       Intervention Provide education and demonstration on how to check pulse in carotid and radial  arteries.;Review the importance of being able to check your own pulse for safety during independent exercise       Expected Outcomes Short Term: Able to explain why pulse checking is important during independent exercise;Long Term: Able to check pulse independently and accurately       Understanding of Exercise Prescription Yes       Intervention Provide education, explanation, and written materials on patient's individual exercise prescription       Expected Outcomes Short Term: Able to explain program exercise prescription;Long Term: Able to explain home exercise prescription to exercise independently              Exercise Goals Re-Evaluation :  Exercise Goals Re-Evaluation    Row Name 05/11/20 0958 05/19/20 1355 06/02/20 0751 06/16/20 1248 06/30/20 1322     Exercise Goal Re-Evaluation   Exercise Goals Review Increase Physical Activity;Increase Strength and Stamina;Able to understand and use rate of perceived exertion (RPE) scale;Able to understand and use Dyspnea scale;Knowledge and understanding of Target Heart Rate Range (THRR);Understanding of Exercise Prescription -- Increase Physical Activity;Increase Strength and Stamina;Understanding of Exercise Prescription Increase Physical Activity;Increase Strength and  Stamina;Understanding of Exercise Prescription Increase Physical Activity;Increase Strength and Stamina;Understanding of Exercise Prescription   Comments Reviewed RPE and dyspnea scales, THR and program prescription with pt today.  Pt voiced understanding and was given a copy of goals to take home. Laurey Arrow has tolerated exercise well in hs first full week.  Staff will monitor progress. Laurey Arrow is doing well in rehab.  He still needs to be reminded about what to do each day.  We have encouraged him to watch [place] during weights, but he continues to just follow the other patients instead of listening to staff.  He is on level 4 for the XR.  We will continue to monitor his progress. Laurey Arrow has only attended once this month so far. Regular attendance will help him progress. Laurey Arrow is doing well in rehab.  He has now decided to add in a third day for rehab to improve his stamina even more.  He is up to level 4 on the XR.  We will continue to monitor his progress.   Expected Outcomes Short: Use RPE daily to regulate intensity. Long: Follow program prescription in THR. Short: attend consistently Long:  improve overall stamina Short: Increase hand weights Long: Continue to improve stamina. Short:  attend consistently Long: increase stamina and MET level Short: Continue to increase workloads Long: Continue to improve stamina.   Nowthen Name 07/14/20 1334             Exercise Goal Re-Evaluation   Exercise Goals Review Increase Physical Activity;Increase Strength and Stamina;Understanding of Exercise Prescription       Comments Laurey Arrow is progressing well.  He has increased levels on NS and improved average MET level.  He works in correct THR range.       Expected Outcomes Short: continue to exercise consistently Long: continue to improve stamina              Discharge Exercise Prescription (Final Exercise Prescription Changes):  Exercise Prescription Changes - 07/14/20 1300      Response to Exercise   Blood Pressure (Admit)  110/60    Blood Pressure (Exercise) 142/80    Blood Pressure (Exit) 126/64    Heart Rate (Admit) 56 bpm    Heart Rate (Exercise) 105 bpm    Heart Rate (Exit) 63 bpm    Rating of Perceived Exertion (Exercise) 14  Symptoms none    Duration Continue with 30 min of aerobic exercise without signs/symptoms of physical distress.    Intensity THRR unchanged      Progression   Progression Continue to progress workloads to maintain intensity without signs/symptoms of physical distress.    Average METs 3.5      Resistance Training   Training Prescription Yes    Weight 4 lb    Reps 10-15      Interval Training   Interval Training No      Recumbant Bike   Level 3    RPM 50    Minutes 15    METs 2.85      NuStep   Level 5    SPM 80    Minutes 15    METs 4.1           Nutrition:  Target Goals: Understanding of nutrition guidelines, daily intake of sodium <1560m, cholesterol <2079m calories 30% from fat and 7% or less from saturated fats, daily to have 5 or more servings of fruits and vegetables.  Education: Controlling Sodium/Reading Food Labels -Group verbal and written material supporting the discussion of sodium use in heart healthy nutrition. Review and explanation with models, verbal and written materials for utilization of the food label.   Education: General Nutrition Guidelines/Fats and Fiber: -Group instruction provided by verbal, written material, models and posters to present the general guidelines for heart healthy nutrition. Gives an explanation and review of dietary fats and fiber.   [facility]  Date 06/17/20  [facility] Instruction Review Code 1- Verbalizes Understanding      Biometrics:  Pre Biometrics - 05/07/20 1115      Pre Biometrics   Height 5' 8.8" (1.748 m)    Weight 156 lb 14.4 oz (71.2 kg)    BMI (Calculated) 23.29    Single Leg Stand 5 seconds            Nutrition Therapy Plan  and Nutrition Goals:  Nutrition Therapy & Goals - 05/20/20 0825      Nutrition Therapy   Diet heart healthy, low Na    Protein (specify units) 55-60g    Fiber 30 grams    Whole Grain Foods 3 servings    Saturated Fats 12 max. grams    Fruits and Vegetables 5 servings/day    Sodium 1.5 grams      Personal Nutrition Goals   Nutrition Goal ST: none at this time as wife does the cooking and his memory is poor LT: get healthier and be more active (yard work, work around [employer] lower LDL, increase HDL    Comments Whatever his wife cooks. 3  light meals. cereal (cheerios and raisin bran- whole milk) in morning and sometimes bacon and eggs. L: sandwich (peanut butter and  banana on whole wheat)  or cooked meal (beans, peas, and cornbread, cabbage) D: chicken but will mix it up. Wife is a suLibrarian, academict the cancer center. Memory has been worse since heart attack. Discussed heart healthy eating. Pt takes BG 3x/day, but does not remember what the numbers usually are.      Intervention Plan   Intervention Prescribe, educate and counsel regarding individualized specific dietary modifications aiming towards targeted core components such as weight, hypertension, lipid management, diabetes, heart failure and other comorbidities.;Nutrition handout(s) given to patient.    Expected Outcomes Short Term Goal: Understand basic principles of dietary content, such as  calories, fat, sodium, cholesterol and nutrients.;Short Term Goal: A plan has been developed with personal nutrition goals set during dietitian appointment.;Long Term Goal: Adherence to prescribed nutrition plan.           Nutrition Assessments:  Nutrition Assessments - 05/11/20 0927      MEDFICTS Scores   Pre Score 66           MEDIFICTS Score Key:          ?70 Need to make dietary changes          40-70 Heart Healthy Diet         ? 40 Therapeutic Level Cholesterol Diet  Nutrition Goals Re-Evaluation:  Nutrition Goals Re-Evaluation     Russell Name 06/26/20 0757             Goals   Nutrition Goal ST: none at this time as wife does the cooking and his memory is poor LT: get healthier and be more active (yard work, work around [employer]), lower LDL, increase HDL       Comment Briefly checked in with pt regarding nutrition. No new changes at this time.       Expected Outcome ST: none at this time as wife does the cooking and his memory is poor LT: get healthier and be more active (yard work, work around [employer]), lower LDL, increase HDL              Nutrition Goals Discharge (Final Nutrition Goals Re-Evaluation):  Nutrition Goals Re-Evaluation - 06/26/20 0757      Goals   Nutrition Goal ST: none at this time as wife does the cooking and his memory is poor LT: get healthier and be more active (yard work, work around [employer]), lower LDL, increase HDL    Comment Briefly checked in with pt regarding nutrition. No new changes at this time.    Expected Outcome ST: none at this time as wife does the cooking and his memory is poor LT: get healthier and be more active (yard work, work around [employer]), lower LDL, increase HDL           Psychosocial: Target Goals: Acknowledge presence or absence of significant depression and/or stress, maximize coping skills, provide positive support system. Participant is able to verbalize types and ability to use techniques and skills needed for reducing stress and depression.   Education: Depression - Provides group verbal and written instruction on the correlation between heart/lung disease and depressed mood, treatment options, and the stigmas associated with seeking treatment.   Cardiac Rehab from 07/08/2020 in [facility] Cardiac and Pulmonary Rehab  Date 07/08/20  Educator [facility]  Instruction Review Code 1- [employer] Understanding      Education: Sleep Hygiene -Provides group verbal and written instruction about how sleep can affect your health.  Define sleep hygiene, discuss sleep  cycles and impact of sleep habits. Review good sleep hygiene tips.     Education: Stress and Anxiety: - Provides group verbal and written instruction about the health risks of elevated stress and causes of high stress.  Discuss the correlation between heart/lung disease and anxiety and treatment options. Review healthy ways to manage with stress and anxiety.    Initial Review & Psychosocial Screening:  Initial Psych Review & Screening - 04/24/20 1407      Initial Review   Current issues with Current Stress Concerns    Source of Stress Concerns Chronic Illness      Family Dynamics  Good Support System? Yes      Barriers   Psychosocial barriers to participate in program There are no identifiable barriers or psychosocial needs.;The patient should benefit from training in stress management and relaxation.      Screening Interventions   Interventions Encouraged to exercise;To provide support and resources with identified psychosocial needs;Provide feedback about the scores to participant    Expected Outcomes Short Term goal: Utilizing psychosocial counselor, staff and physician to assist with identification of specific Stressors or current issues interfering with healing process. Setting desired goal for each stressor or current issue identified.;Long Term Goal: Stressors or current issues are controlled or eliminated.;Short Term goal: Identification and review with participant of any Quality of Life or Depression concerns found by scoring the questionnaire.;Long Term goal: The participant improves quality of Life and PHQ9 Scores as seen by post scores and/or verbalization of changes           Quality of Life Scores:   Quality of Life - 05/11/20 0928      Quality of Life   Select Quality of Life      Quality of Life Scores   Health/Function Pre 22.4 %    Socioeconomic Pre 22.58 %    Psych/Spiritual Pre 24 %    Family Pre 20.4 %    GLOBAL Pre 22.47 %          Scores of 19 and  below usually indicate a poorer quality of life in these areas.  A difference of  2-3 points is a clinically meaningful difference.  A difference of 2-3 points in the total score of the Quality of Life Index has been associated with significant improvement in overall quality of life, self-image, physical symptoms, and general health in studies assessing change in quality of life.  PHQ-9: Recent Review Flowsheet Data    Depression screen [facility] 2/9 05/11/2020   Decreased Interest 0   Down, Depressed, Hopeless 0   PHQ - 2 Score 0   Altered sleeping 0   Tired, decreased energy 0   Change in appetite 0   Feeling bad or failure about yourself  0   Trouble concentrating 0   Moving slowly or fidgety/restless 0   Suicidal thoughts 0   PHQ-9 Score 0   Difficult doing work/chores Not difficult at all     Interpretation of Total Score  Total Score Depression Severity:  1-4 = Minimal depression, 5-9 = Mild depression, 10-14 = Moderate depression, 15-19 = Moderately severe depression, 20-27 = Severe depression   Psychosocial Evaluation and Intervention:  Psychosocial Evaluation - 04/24/20 1417      Psychosocial Evaluation & Interventions   Comments 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 He and his wife both state that his memory has not been good since the CABG, so she has to help him remember a lot more. He says he doesn't feel too stressed out and that he sleeps very well. He and his wife look forward to him starting cardiac rehab for educaion and exercise.    Expected Outcomes Short: attend [facility] for exercise and education. Long: develop positive self care habits.    Continue Psychosocial Services  Follow up required by staff           Psychosocial Re-Evaluation:  Psychosocial Re-Evaluation    [place] Name 06/03/20 0804 07/01/20 0750           Psychosocial Re-Evaluation   Current issues with None Identified None Identified  Comments Patient reports no issues  with their current mental states, sleep, stress, depression or anxiety. Will follow up with patient in a few weeks for any changes. Pete didnt sleep well last night for the first time since his event.  Otherwise he sleeps well and has no other issues.      Expected Outcomes Short: Continue to exercise regularly to support mental health and notify staff of any changes. Long: maintain mental health and well being through teaching of rehab or prescribed medications independently. Short: Continue to exercise regularly to support mental health and notify staff of any changes. Long: maintain mental health and well being through teaching of rehab or prescribed medications independently.      Interventions Encouraged to attend Cardiac Rehabilitation for the exercise Encouraged to attend Cardiac Rehabilitation for the exercise      Continue Psychosocial Services  Follow up required by staff --             Psychosocial Discharge (Final Psychosocial Re-Evaluation):  Psychosocial Re-Evaluation - 07/01/20 0750      Psychosocial Re-Evaluation   Current issues with None Identified    Comments Pete didnt sleep well last night for the first time since his event.  Otherwise he sleeps well and has no other issues.    Expected Outcomes Short: Continue to exercise regularly to support mental health and notify staff of any changes. Long: maintain mental health and well being through teaching of rehab or prescribed medications independently.    Interventions Encouraged to attend Cardiac Rehabilitation for the exercise           Vocational Rehabilitation: Provide vocational rehab assistance to qualifying candidates.   Vocational Rehab Evaluation & Intervention:  Vocational Rehab - 04/24/20 1407      Initial Vocational Rehab Evaluation & Intervention   Assessment shows need for Vocational Rehabilitation No           Education: Education Goals: Education classes will be provided on a variety of topics  geared toward better understanding of heart health and risk factor modification. Participant will state understanding/return demonstration of topics presented as noted by education test scores.  Learning Barriers/Preferences:  Learning Barriers/Preferences - 04/24/20 1408      Learning Barriers/Preferences   Learning Barriers None    Learning Preferences None           General Cardiac Education Topics:  AED/CPR: - Group verbal and written instruction with the use of models to demonstrate the basic use of the AED with the basic ABC's of resuscitation.   Anatomy & Physiology of the Heart: - Group verbal and written instruction and models provide basic cardiac anatomy and physiology, with the coronary electrical and arterial systems. Review of Valvular disease and Heart Failure   Cardiac Procedures: - Group verbal and written instruction to review commonly prescribed medications for heart disease. Reviews the medication, class of the drug, and side effects. Includes the steps to properly store meds and maintain the prescription regimen. (beta blockers and nitrates)   Cardiac Medications I: - Group verbal and written instruction to review commonly prescribed medications for heart disease. Reviews the medication, class of the drug, and side effects. Includes the steps to properly store meds and maintain the prescription regimen.   Cardiac Rehab from 07/08/2020 in [facility] Cardiac and Pulmonary Rehab  Date 07/01/20  Educator SB  Instruction Review Code 1- Verbalizes Understanding      Cardiac Medications II: -Group verbal and written instruction to review commonly prescribed medications for  heart disease. Reviews the medication, class of the drug, and side effects. (all other drug classes)    Go Sex-Intimacy & Heart Disease, Get SMART - Goal Setting: - Group verbal and written instruction through game format to discuss heart disease and the return to sexual intimacy. Provides group  verbal and written material to discuss and apply goal setting through the application of the S.M.A.R.T. Method.   Other Matters of the Heart: - Provides group verbal, written materials and models to describe Stable Angina and Peripheral Artery. Includes description of the disease process and treatment options available to the cardiac patient.   Infection Prevention: - Provides verbal and written material to individual with discussion of infection control including proper hand washing and proper equipment cleaning during exercise session.   Cardiac Rehab from 07/08/2020 in [facility] Cardiac and Pulmonary Rehab  Date 05/07/20  Educator [facility]  Instruction Review Code 1- Verbalizes Understanding      Falls Prevention: - Provides verbal and written material to individual with discussion of falls prevention and safety.   Cardiac Rehab from 07/08/2020 in [facility] Cardiac and Pulmonary Rehab  Date 05/07/20  Educator [facility]  Instruction Review Code 1- Verbalizes Understanding      Other: -Provides group and verbal instruction on various topics (see comments)   Knowledge Questionnaire Score:  Knowledge Questionnaire Score - 05/11/20 1275      Knowledge Questionnaire Score   Pre Score 21/26 Education focus: Angina, Sex, Heart Failure, PAD, Nutrition, Exercise           Core Components/Risk Factors/Patient Goals at Admission:  Personal Goals and Risk Factors at Admission - 05/07/20 1115      Core Components/Risk Factors/Patient Goals on Admission    Weight Management Yes;Weight Maintenance    Intervention Weight Management: Develop a combined nutrition and exercise program designed to reach desired caloric intake, while maintaining appropriate intake of nutrient and fiber, sodium and fats, and appropriate energy expenditure required for the weight goal.;Weight Management: Provide education and appropriate resources to help participant work on and attain dietary goals.    Admit Weight 156 lb 14.4 oz  (71.2 kg)    Goal Weight: Short Term 156 lb (70.8 kg)    Goal Weight: Long Term 156 lb (70.8 kg)    Expected Outcomes Short Term: Continue to assess and modify interventions until short term weight is achieved;Long Term: Adherence to nutrition and physical activity/exercise program aimed toward attainment of established weight goal;Weight Maintenance: Understanding of the daily nutrition guidelines, which includes 25-35% calories from fat, 7% or less cal from saturated fats, less than 266m cholesterol, less than 1.5gm of sodium, & 5 or more servings of fruits and vegetables daily    Diabetes Yes    Intervention Provide education about signs/symptoms and action to take for hypo/hyperglycemia.;Provide education about proper nutrition, including hydration, and aerobic/resistive exercise prescription along with prescribed medications to achieve blood glucose in normal ranges: Fasting glucose 65-99 mg/dL    Expected Outcomes Short Term: Participant verbalizes understanding of the signs/symptoms and immediate care of hyper/hypoglycemia, proper foot care and importance of medication, aerobic/resistive exercise and nutrition plan for blood glucose control.;Long Term: Attainment of HbA1C < 7%.    Hypertension Yes    Intervention Provide education on lifestyle modifcations including regular physical activity/exercise, weight management, moderate sodium restriction and increased consumption of fresh fruit, vegetables, and low fat dairy, alcohol moderation, and smoking cessation.;Monitor prescription use compliance.    Expected Outcomes Short Term: Continued assessment and intervention until BP is <  140/52m HG in hypertensive participants. < 130/889mHG in hypertensive participants with diabetes, heart failure or chronic kidney disease.;Long Term: Maintenance of blood pressure at goal levels.    Lipids Yes    Intervention Provide education and support for participant on nutrition & aerobic/resistive exercise along  with prescribed medications to achieve LDL <7075mHDL >58m86m  Expected Outcomes Short Term: Participant states understanding of desired cholesterol values and is compliant with medications prescribed. Participant is following exercise prescription and nutrition guidelines.;Long Term: Cholesterol controlled with medications as prescribed, with individualized exercise RX and with personalized nutrition plan. Value goals: LDL < 70mg13mL > 40 mg.           Education:Diabetes - Individual verbal and written instruction to review signs/symptoms of diabetes, desired ranges of glucose level fasting, after meals and with exercise. Acknowledge that pre and post exercise glucose checks will be done for 3 sessions at entry of program.   [facility]  Date 04/24/20  Educator [facility]  [facility] Review Code 1- VerbaUnited States Steel Corporationrstanding      Education: Know Your Numbers and Risk Factors: -Group verbal and written instruction about important numbers in your health.  Discussion of what are risk factors and how they play a role in the disease process.  Review of Cholesterol, Blood Pressure, Diabetes, and BMI and the role they play in your overall health.   Core Components/Risk Factors/Patient Goals Review:   Goals and Risk Factor Review    Row Name 06/03/20 0808 [id number]/21 0752           Core Components/Risk Factors/Patient Goals Review   Personal Goals Review Weight Management/Obesity;Lipids;Hypertension;Diabetes Weight Management/Obesity;Lipids;Hypertension;Diabetes      Review Patient has been doing well in the program and has no questions about his medications. He has not been monitoring his blood pressure at home. He checks his sugar at home and has been within his normal range. Informed him why it is important to check his blood pressure at home. Pete checks BG most days at least 3 times.  He states it is steady. He is checking BP at home some days.  He  is doing well with the program overall.      Expected Outcomes Short: check blood pressure at home. Long: maintain blood pressure checks at home independently. Short: continue to monitor BP and BG Long: manage risk factors             Core Components/Risk Factors/Patient Goals at Discharge (Final Review):   Goals and Risk Factor Review - 07/01/20 0752      Core Components/Risk Factors/Patient Goals Review   Personal Goals Review Weight Management/Obesity;Lipids;Hypertension;Diabetes    Review Pete checks BG most days at least 3 times.  He states it is steady. He is checking BP at home some days.  He is doing well with the program overall.    Expected Outcomes Short: continue to monitor BP and BG Long: manage risk factors           ITP Comments:  ITP Comments    Row Name 04/24/20 1416 05/07/20 1053 05/11/20 0957 05/20/20 0620 06/17/20 1038   ITP Comments Initial telephone orientation completed. [facility]. EP orientation scheduled for 5/27 at 11am Completed 6MWT and gym orientation.  Initial ITP created and sent for review to Dr. Mark Emily Filbertical Director. First full day of exercise!  Patient was oriented to gym and equipment including functions, settings,  policies, and procedures.  Patient's individual exercise prescription and treatment plan were reviewed.  All starting workloads were established based on the results of the 6 minute walk test done at initial orientation visit.  The plan for exercise progression was also introduced and progression will be customized based on patient's performance and goals. 30 Day review completed. Medical Director ITP review done, changes made as directed, and signed approval by Medical Director. 30 Day review completed. Medical Director ITP review done, changes made as directed, and signed approval by Medical Director.   [place] Name 07/15/20 0628           ITP Comments 30 Day review completed. Medical Director ITP review done,  changes made as directed, and signed approval by Medical Director.              Comments:

## 2020-07-15 NOTE — Progress Notes (Signed)
Daily Session Note  Patient Details  Name: [name] MRN: [id number] Date of Birth: 15-Jun-1946 Referring Provider:     Cardiac Rehab from 05/07/2020 in [facility] Cardiac and Pulmonary Rehab  Referring Provider [place], Peter MD      Encounter Date: 07/15/2020  Check In:  Session Check In - 07/15/20 0829      Check-In   Supervising physician immediately available to respond to emergencies See telemetry face sheet for immediately available ER MD    Location [facility]-Cardiac & Pulmonary Rehab    Staff Present Heath Lark, RN, BSN, CCRP;Jessica Brainards, MA, RCEP, CCRP, CCET;Joseph [place], [place], ACSM CEP, Exercise Physiologist    Virtual Visit No    Medication changes reported     No    Fall or balance concerns reported    No    Warm-up and Cool-down Performed on first and last piece of equipment    Resistance Training Performed Yes    VAD Patient? No    PAD/SET Patient? No      Pain Assessment   Currently in Pain? No/denies              Social History   Tobacco Use  Smoking Status Former Smoker  . Packs/day: 1.00  . Years: 22.00  . Pack years: 22.00  . Types: Cigarettes, Pipe  . Quit date: 12/17/2014  . Years since quitting: 5.5  Smokeless Tobacco Never Used    Goals Met:  Independence with exercise equipment Exercise tolerated well No report of cardiac concerns or symptoms  Goals Unmet:  Not Applicable  Comments: Pt able to follow exercise prescription today without complaint.  Will continue to monitor for progression.    Dr. Emily Filbert is Medical Director for [place] [facility].

## 2020-07-17 ENCOUNTER — Encounter: Payer: BC Managed Care – PPO | Admitting: *Deleted

## 2020-07-17 ENCOUNTER — Other Ambulatory Visit: Payer: Self-pay

## 2020-07-17 DIAGNOSIS — I252 Old myocardial infarction: Secondary | ICD-10-CM | POA: Diagnosis not present

## 2020-07-17 DIAGNOSIS — Z955 Presence of coronary angioplasty implant and graft: Secondary | ICD-10-CM

## 2020-07-17 DIAGNOSIS — I213 ST elevation (STEMI) myocardial infarction of unspecified site: Secondary | ICD-10-CM

## 2020-07-17 NOTE — Progress Notes (Signed)
Cardiology Office Note   Date:  07/20/2020   ID:  [name], DOB July 16, 1946, MRN [id number]  PCP:  Marton Redwood, MD  Cardiologist:  Beryl Hornberger Martinique, MD EP: None  Chief Complaint  Patient presents with  . Coronary Artery Disease      History of Present Illness: [name] is a 74 y.o. male with PMH of CAD s/p CABG 12/2019 with recent STEMI 03/16/20 with PCI/DES of OM2 x2 and PCI/DES of dLCx, post-op atrial fibrillation, HTN, HLD, and DM type 2, who is seen for follow up.   In January 2021 he presented with chest pain and had an abnormal stress test. Subsequent cardiac cath showed severe 3 vessel CAD. He underwent CABG by Dr Prescott Gum on 12/20/19 including LIMA to LAD, SVG to diagonal, SVG to OM1 and SVG to PDA. Post op course with uneventful.   He was admitted to [facility] from 03/16/20-03/17/20 after presenting with chest pain where he was found to have a STEMI. He was taken emergently to the cath lab which revealed occluded SVG to OM2 and RCA. Patent LIMA to LAD and SVG to diagonal. OM 2 has two separate high grade stenosis and he underwent successful PCI of OM2 with DES x 2 in proximal and distal vessel. Also underwent successful PCI of the distal LCx (90% stenosis) with DES x 1. He did have a 70% proximal RCA proximally and a diffusely diseased PLA branch which fills by left-to-right collaterals medically managed. He was recommended for DAPT with aspirin and plavix. He was recommended to follow-up with the lipid clinic for PSK9-I consideration given prior intolerance to statins and LDL 137 03/16/20. His anticoagulation and amiodarone were discontinued given Afib history occurred following CABG without reoccurrence.   He was seen in our lipid clinic in May and started on Praluent.  On follow up today he states he is feeling very well. Has one focal area of discomfort in left parasternal area at rest at times.  He has actively participated in [place]. BP there has been ok.  Sugar 104-208. Wife notes some days he just doesn't feel good and lays in bed. Some lightheadedness. Notes memory loss.    Past Medical History:  Diagnosis Date  . Arthritis   . Arthritis   . Atrial fibrillation ([place])   . BPH (benign prostatic hypertrophy)   . CAD (coronary artery disease)    stents placed  . Diabetes mellitus   . Diverticulosis   . Dyspnea    when exerting self  . Dysrhythmia    "extra beats"  . Esophageal stricture   . GERD (gastroesophageal reflux disease)   . Headache(784.0)   . Hemorrhoids   . Hernia, inguinal, right   . Hiatal hernia   . Hyperlipidemia   . Hypertension   . Pneumonia   . Staph infection    Left ankle  . 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 Urinary hesitancy     Past Surgical History:  Procedure Laterality Date  . ANKLE ARTHROPLASTY  2006  . CARDIOVASCULAR STRESS TEST  01/22/2009   EF 51%, NO ISCHEMIA  . CARDIOVASCULAR STRESS TEST    . COLONOSCOPY    . CORONARY ANGIOPLASTY WITH STENT PLACEMENT  03/2005  . CORONARY ARTERY BYPASS GRAFT N/A 12/20/2019   Procedure: CORONARY ARTERY BYPASS GRAFTING (CABG) times four, using left internal mammary artery and left greater saphenous vein;  Surgeon: Ivin Poot, MD;  Location: [facility];  Service: Open Heart Surgery;  Laterality: N/A;  . CORONARY STENT INTERVENTION N/A 03/16/2020   Procedure: CORONARY STENT INTERVENTION;  Surgeon: Martinique, Yasmyn Bellisario M, MD;  Location: [facility];  Service: Cardiovascular;  Laterality: N/A;  . CORONARY STENT PLACEMENT  03/10/2005   SUCCESSFUL STENTING OF THE MID AND CRUX OF THE RCA  . CORONARY/GRAFT ACUTE MI REVASCULARIZATION N/A 03/16/2020   Procedure: Coronary/Graft Acute MI Revascularization;  Surgeon: Martinique, Paisli Silfies M, MD;  Location: [facility];  Service: Cardiovascular;  Laterality: N/A;  . ESOPHAGEAL DILATION     multiple procedures  . HERNIA REPAIR  41/74/0814   umbilical hernia  . INGUINAL HERNIA REPAIR  11/28/2011   Procedure: HERNIA REPAIR  INGUINAL ADULT;  Surgeon: Imogene Burn. Georgette Dover, MD;  Location: [facility];  Service: General;  Laterality: Right;  right inguinal hernia repair with mesh  . KNEE SURGERY     Right  . LEFT HEART CATH AND CORONARY ANGIOGRAPHY N/A 12/03/2019   Procedure: LEFT HEART CATH AND CORONARY ANGIOGRAPHY;  Surgeon: Jettie Booze, MD;  Location: [facility];  Service: Cardiovascular;  Laterality: N/A;  . LEFT HEART CATH AND CORS/GRAFTS ANGIOGRAPHY N/A 03/16/2020   Procedure: LEFT HEART CATH AND CORS/GRAFTS ANGIOGRAPHY;  Surgeon: Martinique, Betzy Barbier M, MD;  Location: [facility];  Service: Cardiovascular;  Laterality: N/A;  . POLYPECTOMY    . TEE WITHOUT CARDIOVERSION N/A 12/20/2019   Procedure: TRANSESOPHAGEAL ECHOCARDIOGRAM (TEE);  Surgeon: Prescott Gum, Collier Salina, MD;  Location: [facility];  Service: Open Heart Surgery;  Laterality: N/A;  . TONSILLECTOMY  1958  . UMBILICAL HERNIA REPAIR  11/2006  . US ECHOCARDIOGRAPHY       Current Outpatient Medications  Medication Sig Dispense Refill  . acetaminophen (TYLENOL) 650 MG CR tablet Take 1,300 mg by mouth every 8 (eight) hours as needed for pain.    . Alirocumab (PRALUENT) 150 MG/ML SOAJ Inject 150 mg into the skin every 14 (fourteen) days. 2 pen 11  . aspirin EC 81 MG tablet Take 1 tablet (81 mg total) by mouth daily.    . B Complex-C (B-COMPLEX WITH VITAMIN C) tablet Take 1 tablet by mouth daily with breakfast.     . cetirizine (ZYRTEC) 10 MG tablet Take 10 mg by mouth daily.      . clopidogrel (PLAVIX) 75 MG tablet Take 1 tablet (75 mg total) by mouth daily with breakfast. 30 tablet 11  . empagliflozin (JARDIANCE) 10 MG TABS tablet Take 25 mg by mouth daily.     . fluticasone (FLONASE) 50 MCG/ACT nasal spray Place 2 sprays into both nostrils daily as needed for allergies.     . isosorbide mononitrate (IMDUR) 30 MG 24 hr tablet Take 0.5 tablets (15 mg total) by mouth daily. 16 tablet 6  . lansoprazole (PREVACID) 15 MG capsule Take 15 mg by mouth daily.      Marland Kitchen  lisinopril (ZESTRIL) 20 MG tablet Take 1 tablet (20 mg total) by mouth daily. 30 tablet 1  . metoprolol tartrate 37.5 MG TABS Take 37.5 mg by mouth 2 (two) times daily. 90 tablet 6  . Multiple Minerals-Vitamins (CALCIUM-MAGNESIUM-ZINC-D3) TABS Take 1 tablet by mouth daily with lunch.     . nitroGLYCERIN (NITROSTAT) 0.4 MG SL tablet Place 1 tablet (0.4 mg total) under the tongue every 5 (five) minutes as needed. 25 tablet 12  . OneTouch Delica Lancets 48J MISC 1 each by Other route daily.    Glory Rosebush VERIO test strip 1 each by Other route daily.    Marland Kitchen  Tamsulosin HCl (FLOMAX) 0.4 MG CAPS Take 0.4 mg by mouth daily after supper.      No current facility-administered medications for this visit.    Allergies:   Statins, Bismuth subsalicylate, Codeine, Kiwi extract, Morphine and related, and Altace [ramipril]    Social History:  The patient  reports that he quit smoking about 5 years ago. His smoking use included cigarettes and pipe. He has a 22.00 pack-year smoking history. He has never used smokeless tobacco. He reports that he does not drink alcohol and does not use drugs.   Family History:  The patient's family history includes Cancer in his paternal grandmother; Heart disease in his brother, maternal grandfather, mother, and sister; Ovarian cancer in his paternal grandmother.    ROS:  Please see the history of present illness.   Otherwise, review of systems are positive for none.   All other systems are reviewed and negative.    PHYSICAL EXAM: VS:  BP (!) 148/72 (BP Location: Right Arm, Cuff Size: Normal)   Pulse 67   Ht 5\' 10"  (1.778 m)   Wt 156 lb 6.4 oz (70.9 kg)   BMI 22.44 kg/m  , BMI Body mass index is 22.44 kg/m. GEN: Well nourished, well developed, in no acute distress HEENT: normal Neck: no JVD, carotid bruits, or masses Cardiac: RRR; no murmurs, rubs, or gallops, no edema; +point tenderness to palpation left upper sternal margin Respiratory:  clear to auscultation  bilaterally, normal work of breathing GI: soft, nontender, nondistended, + BS MS: no deformity or atrophy Skin: warm and dry, no rash Neuro:  Strength and sensation are intact Psych: euthymic mood, full affect   EKG:  EKG is not ordered today.    Recent Labs: 12/21/2019: Magnesium 2.3 03/17/2020: BUN 16; Creatinine, Ser 1.30; Hemoglobin 11.9; Platelets 179; Potassium 3.9; Sodium 138 06/25/2020: ALT 11    Lipid Panel    Component Value Date/Time   CHOL 101 06/25/2020 0925   TRIG 70 06/25/2020 0925   HDL 48 06/25/2020 0925   CHOLHDL 2.1 06/25/2020 0925   CHOLHDL 4.4 03/16/2020 0646   VLDL 21 03/16/2020 0646   LDLCALC 38 06/25/2020 0925    dated 04/14/20: A1c 7.2%.   Wt Readings from Last 3 Encounters:  07/20/20 156 lb 6.4 oz (70.9 kg)  06/17/20 155 lb (70.3 kg)  05/07/20 156 lb 14.4 oz (71.2 kg)      Other studies Reviewed: Additional studies/ records that were reviewed today include:   Left heart catheterization 03/16/20:  Mid LAD-2 lesion is 100% stenosed.  Mid LAD-1 lesion is 80% stenosed.  Prox LAD lesion is 25% stenosed.  Dist Cx lesion is 90% stenosed.  A drug-eluting stent was successfully placed using a [place] [id number].  Post intervention, there is a 0% residual stenosis.  Prox RCA lesion is 65% stenosed.  RPAV lesion is 95% stenosed.  Mid RCA lesion is 30% stenosed.  Dist RCA lesion is 30% stenosed.  2nd Mrg-1 lesion is 99% stenosed.  A drug-eluting stent was successfully placed using a STENT RESOLUTE ONYX 2.5X18.  Post intervention, there is a 0% residual stenosis.  2nd Mrg-2 lesion is 90% stenosed.  A drug-eluting stent was successfully placed using a [place] 7.61P50.  Post intervention, there is a 0% residual stenosis.  LIMA graft was visualized by angiography and is normal in caliber.  The graft exhibits no disease.  SVG graft was visualized by angiography and is normal in caliber.  The graft exhibits no  disease.  SVG graft was visualized by angiography.  SVG graft was visualized by angiography.  Origin lesion is 100% stenosed.  Origin to Prox Graft lesion is 100% stenosed.  LV end diastolic pressure is normal.  1. Severe 3 vessel obstructive CAD 2. Patent LIMA to the LAD 3. Patent SVG to the diagonal 4. Occluded SVG to OM2. The graft appeared to insert into a segment of severe disease 5. Occluded SVG to RCA possibly due to competitive filling from the native vessel 6. Successful PCI of OM2 with DES x 2 in proximal and distal vessel 7. Successful PCI of the distal LCx with DES x 1.  8. Normal LVEDP  Plan: DAPT for at least one year. Will DC Eliquis since he has had no Afib since DC from surgery. DC amiodarone. Intolerant to multiple cholesterol lowering drugs in the past. Will see lipid clinic as outpatient to get started on PCSK 9 inhibitor. I doubt the lesion in the proximal RCA is flow limiting so we will treat this medically for now. If he has recurrent angina we could consider PCI of this vessel. Patient may be a candidate for DC tomorrow if clinically stable.   Echocardiogram 12/2019: 1. Left ventricular ejection fraction, by visual estimation, is 55 to  60%. The left ventricle has normal function. There is no left ventricular  hypertrophy.  2. Left ventricular diastolic parameters are indeterminate.  3. The left ventricle has no regional wall motion abnormalities.  4. Global right ventricle has normal systolic function.The right  ventricular size is normal. No increase in right ventricular wall  thickness.  5. Left atrial size was mildly dilated.  6. Right atrial size was mildly dilated.  7. The mitral valve is normal in structure. No evidence of mitral valve  regurgitation. No evidence of mitral stenosis.  8. The tricuspid valve is normal in structure.  9. The aortic valve is normal in structure. Aortic valve regurgitation is  not visualized. No evidence of  aortic valve sclerosis or stenosis.  10. The pulmonic valve was normal in structure. Pulmonic valve  regurgitation is not visualized.  11. The atrial septum is grossly normal.   ASSESSMENT AND PLAN:   1. CAD s/p CABG 12/2019 with subsequent PCI/DES to OM2 x2 and LCx x1 03/16/20 with cardiac cath showing occlusion of SVG to OM1 and SVG to PDA:  - he still has some musculoskeletal pain. No significant angina - Continue aspirin and plavix for one year - Continue metoprolol and imdur - lifestyle modification  2. HTN: BP is well controlled at Rehab - Coninue metoprolol and lisinopril  3. HLD: LDL 137 03/16/20. He is intolerant to statins - now on Praluent with excellent response. LDL 38.  4. DM type 2: A1C 7.2 - Continue metformin and Jardiance - has follow up with Dr Brigitte Pulse next month.  History of hypoglycemia on glipizide.   5. Post-op atrial fibrillation: occurred in the post CABG setting and no evidence of recurrence.  - Continue to monitor for recurrence.   Current medicines are reviewed at length with the patient today.  The patient does not have concerns regarding medicines.  The following changes have been made:  As above  Labs/ tests ordered today include:   No orders of the defined types were placed in this encounter.    Disposition:   FU with Dr. Martinique 6 months   Signed, Janney Priego Martinique, MD  07/20/2020 9:09 AM

## 2020-07-17 NOTE — Progress Notes (Signed)
Daily Session Note  Patient Details  Name: [name] MRN: [id number] Date of Birth: [birth year] Referring Provider:     Cardiac Rehab from 05/07/2020 in [facility] Cardiac and Pulmonary Rehab  Referring Provider Martinique, Peter MD      Encounter Date: 07/17/2020  Check In:  Session Check In - 07/17/20 0844      Check-In   Supervising physician immediately available to respond to emergencies See telemetry face sheet for immediately available ER MD    Location [facility]-Cardiac & Pulmonary Rehab    Staff Present Heath Lark, RN, BSN, CCRP;Joseph Hood RCP,RRT,BSRT;Jessica [place], [place], [place], [place], CCET    Virtual Visit No    Medication changes reported     No    Fall or balance concerns reported    No    Warm-up and Cool-down Performed on first and last piece of equipment    Resistance Training Performed Yes    VAD Patient? No    PAD/SET Patient? No      Pain Assessment   Currently in Pain? No/denies              Social History   Tobacco Use  Smoking Status Former Smoker  . Packs/day: 1.00  . Years: 22.00  . Pack years: 22.00  . Types: Cigarettes, Pipe  . Quit date: 12/17/2014  . Years since quitting: 5.5  Smokeless Tobacco Never Used    Goals Met:  Independence with exercise equipment Exercise tolerated well No report of cardiac concerns or symptoms  Goals Unmet:  Not Applicable  Comments: Pt able to follow exercise prescription today without complaint.  Will continue to monitor for progression.    Dr. Emily Filbert is Medical Director for [place] and [facility].

## 2020-07-20 ENCOUNTER — Ambulatory Visit: Payer: BC Managed Care – PPO | Admitting: Cardiology

## 2020-07-20 ENCOUNTER — Encounter: Payer: Self-pay | Admitting: Cardiology

## 2020-07-20 ENCOUNTER — Other Ambulatory Visit: Payer: Self-pay

## 2020-07-20 VITALS — BP 148/72 | HR 67 | Ht 70.0 in | Wt 156.4 lb

## 2020-07-20 DIAGNOSIS — E119 Type 2 diabetes mellitus without complications: Secondary | ICD-10-CM

## 2020-07-20 DIAGNOSIS — I10 Essential (primary) hypertension: Secondary | ICD-10-CM

## 2020-07-20 DIAGNOSIS — E7800 Pure hypercholesterolemia, unspecified: Secondary | ICD-10-CM | POA: Diagnosis not present

## 2020-07-20 DIAGNOSIS — Z951 Presence of aortocoronary bypass graft: Secondary | ICD-10-CM

## 2020-07-20 DIAGNOSIS — I25810 Atherosclerosis of coronary artery bypass graft(s) without angina pectoris: Secondary | ICD-10-CM | POA: Diagnosis not present

## 2020-07-22 ENCOUNTER — Other Ambulatory Visit: Payer: Self-pay

## 2020-07-22 ENCOUNTER — Encounter: Payer: BC Managed Care – PPO | Admitting: *Deleted

## 2020-07-22 DIAGNOSIS — Z955 Presence of coronary angioplasty implant and graft: Secondary | ICD-10-CM

## 2020-07-22 DIAGNOSIS — I213 ST elevation (STEMI) myocardial infarction of unspecified site: Secondary | ICD-10-CM

## 2020-07-22 DIAGNOSIS — I252 Old myocardial infarction: Secondary | ICD-10-CM | POA: Diagnosis not present

## 2020-07-22 NOTE — Progress Notes (Signed)
Daily Session Note  Patient Details  Name: [name] MRN: [id number] Date of Birth: June 22, 1946 Referring Provider:     Cardiac Rehab from 05/07/2020 in [facility] Cardiac and Pulmonary Rehab  Referring Provider Martinique, Peter MD      Encounter Date: 07/22/2020  Check In:  Session Check In - 07/22/20 0834      Check-In   Supervising physician immediately available to respond to emergencies See telemetry face sheet for immediately available ER MD    Location [facility]    Staff Present Heath Lark, RN, BSN, CCRP;Jessica [place], MA, RCEP, CCRP, CCET;Joseph [place], [place], ACSM CEP, Exercise Physiologist    Virtual Visit No    Medication changes reported     No    Fall or balance concerns reported    No    Warm-up and Cool-down Performed on first and last piece of equipment    Resistance Training Performed Yes    VAD Patient? No    PAD/SET Patient? No      Pain Assessment   Currently in Pain? No/denies              Social History   Tobacco Use  Smoking Status Former Smoker  . Packs/day: 1.00  . Years: 22.00  . Pack years: 22.00  . Types: Cigarettes, Pipe  . Quit date: 12/17/2014  . Years since quitting: 5.6  Smokeless Tobacco Never Used    Goals Met:  Independence with exercise equipment Exercise tolerated well No report of cardiac concerns or symptoms  Goals Unmet:  Not Applicable  Comments: Pt able to follow exercise prescription today without complaint.  Will continue to monitor for progression.    Dr. Emily Filbert is Medical Director for [place] and [facility].

## 2020-07-27 ENCOUNTER — Encounter: Payer: BC Managed Care – PPO | Admitting: *Deleted

## 2020-07-27 ENCOUNTER — Other Ambulatory Visit: Payer: Self-pay

## 2020-07-27 DIAGNOSIS — I213 ST elevation (STEMI) myocardial infarction of unspecified site: Secondary | ICD-10-CM

## 2020-07-27 DIAGNOSIS — I252 Old myocardial infarction: Secondary | ICD-10-CM | POA: Diagnosis not present

## 2020-07-27 DIAGNOSIS — Z955 Presence of coronary angioplasty implant and graft: Secondary | ICD-10-CM

## 2020-07-27 NOTE — Progress Notes (Signed)
Daily Session Note  Patient Details  Name: [name] MRN: [id number] Date of Birth: 1946-01-27 Referring Provider:     Cardiac Rehab from 05/07/2020 in [facility] Cardiac and Pulmonary Rehab  Referring Provider Martinique, Peter MD      Encounter Date: 07/27/2020  Check In:  Session Check In - 07/27/20 0841      Check-In   Supervising physician immediately available to respond to emergencies See telemetry face sheet for immediately available ER MD    Location [facility]    Staff Present Heath Lark, RN, BSN, CCRP;Jessica [place], MA, RCEP, CCRP, [place], BS, ACSM CEP, Exercise Physiologist    Virtual Visit No    Medication changes reported     No    Fall or balance concerns reported    No    Warm-up and Cool-down Performed on first and last piece of equipment    Resistance Training Performed Yes    VAD Patient? No    PAD/SET Patient? No      Pain Assessment   Currently in Pain? No/denies              Social History   Tobacco Use  Smoking Status Former Smoker  . Packs/day: 1.00  . Years: 22.00  . Pack years: 22.00  . Types: Cigarettes, Pipe  . Quit date: 12/17/2014  . Years since quitting: 5.6  Smokeless Tobacco Never Used    Goals Met:  Independence with exercise equipment Exercise tolerated well No report of cardiac concerns or symptoms  Goals Unmet:  Not Applicable  Comments: Pt able to follow exercise prescription today without complaint.  Will continue to monitor for progression.    Dr. Emily Filbert is Medical Director for [place] and [facility].

## 2020-07-29 ENCOUNTER — Encounter: Payer: BC Managed Care – PPO | Admitting: *Deleted

## 2020-07-29 ENCOUNTER — Other Ambulatory Visit: Payer: Self-pay

## 2020-07-29 DIAGNOSIS — Z955 Presence of coronary angioplasty implant and graft: Secondary | ICD-10-CM

## 2020-07-29 DIAGNOSIS — I252 Old myocardial infarction: Secondary | ICD-10-CM | POA: Diagnosis not present

## 2020-07-29 DIAGNOSIS — I213 ST elevation (STEMI) myocardial infarction of unspecified site: Secondary | ICD-10-CM

## 2020-07-29 NOTE — Progress Notes (Signed)
Daily Session Note  Patient Details  Name: [name] MRN: [id number] Date of Birth: [birth year] Referring Provider:     Cardiac Rehab from 05/07/2020 in [facility] Card[facility] and [facility]monary Rehab  Referring Provider Martinique, Peter MD      Encounter Date: 07/29/2020  Check In:  Session Check In - 07/29/20 0737      Check-In   Supervising physician immediately available to respond to emergencies See telemetry face sheet for immediately available ER MD    Location [facility]    Staff Present Heath Lark, RN, BSN, CCRP;Jessica [place], MA, RCEP, CCRP, [place], [place], ACSM CEP, Exercise Physiologist;Kara Eliezer Bottom, MS Exercise Physiologist    Virtual Visit No    Medication changes reported     No    Fall or balance concerns reported    No    Warm-up and Cool-down Performed on first and last piece of equipment    Resistance Training Performed Yes    PAD/SET Patient? No      Pain Assessment   Currently in Pain? No/denies              Social History   Tobacco Use  Smoking Status Former Smoker  . Packs/day: 1.00  . Years: 22.00  . Pack years: 22.00  . Types: Cigarettes, Pipe  . Quit date: 12/17/2014  . Years since quitting: 5.6  Smokeless Tobacco Never Used    Goals Met:  Independence with exercise equipment Exercise tolerated well No report of cardiac concerns or symptoms Strength training completed today  Goals Unmet:  Not Applicable  Comments: Pt able to follow exercise prescription today without complaint.  Will continue to monitor for progression.   Short: Use RPE daily to regulate intensity. Long: Follow program prescription in [facility].'   Dr. Emily Filbert is Medical Director for [place] and [facility].

## 2020-07-31 ENCOUNTER — Other Ambulatory Visit: Payer: Self-pay

## 2020-07-31 DIAGNOSIS — Z951 Presence of aortocoronary bypass graft: Secondary | ICD-10-CM

## 2020-07-31 DIAGNOSIS — Z955 Presence of coronary angioplasty implant and graft: Secondary | ICD-10-CM

## 2020-07-31 DIAGNOSIS — I252 Old myocardial infarction: Secondary | ICD-10-CM | POA: Diagnosis not present

## 2020-07-31 DIAGNOSIS — I213 ST elevation (STEMI) myocardial infarction of unspecified site: Secondary | ICD-10-CM

## 2020-07-31 NOTE — Progress Notes (Signed)
Daily Session Note  Patient Details  Name: [name] MRN: [id number] Date of Birth: 12/19/1945 Referring Provider:     Cardiac Rehab from 05/07/2020 in [facility] Cardiac and Pulmonary Rehab  Referring Provider Martinique, Peter MD      Encounter Date: 07/31/2020  Check In:  Session Check In - 07/31/20 0803      Check-In   Supervising physician immediately available to respond to emergencies See telemetry face sheet for immediately available ER MD    Location [facility]    Staff Present Carson Myrtle, BS, RRT, CPFT;Vida Rigger RN, BSN;Jessica Luan [place], MA, RCEP, CCRP, CCET    Virtual Visit No    Medication changes reported     No    Fall or balance concerns reported    No    Warm-up and Cool-down Performed on first and last piece of equipment    Resistance Training Performed Yes    VAD Patient? No    PAD/SET Patient? No      Pain Assessment   Currently in Pain? No/denies              Social History   Tobacco Use  Smoking Status Former Smoker  . Packs/day: 1.00  . Years: 22.00  . Pack years: 22.00  . Types: Cigarettes, Pipe  . Quit date: 12/17/2014  . Years since quitting: 5.6  Smokeless Tobacco Never Used    Goals Met:  Proper associated with RPD/PD & O2 Sat Independence with exercise equipment Exercise tolerated well No report of cardiac concerns or symptoms Strength training completed today  Goals Unmet:  Not Applicable  Comments: Pt able to follow exercise prescription today without complaint.  Will continue to monitor for progression.   Dr. Emily Filbert is Medical Director for [place] and [facility].

## 2020-08-03 ENCOUNTER — Other Ambulatory Visit: Payer: Self-pay

## 2020-08-03 ENCOUNTER — Encounter: Payer: BC Managed Care – PPO | Admitting: *Deleted

## 2020-08-03 DIAGNOSIS — Z955 Presence of coronary angioplasty implant and graft: Secondary | ICD-10-CM

## 2020-08-03 DIAGNOSIS — I213 ST elevation (STEMI) myocardial infarction of unspecified site: Secondary | ICD-10-CM

## 2020-08-03 DIAGNOSIS — I252 Old myocardial infarction: Secondary | ICD-10-CM | POA: Diagnosis not present

## 2020-08-03 NOTE — Progress Notes (Signed)
Daily Session Note  Patient Details  Name: [name] MRN: [id number] Date of Birth: 08/23/1946 Referring Provider:     Cardiac Rehab from 05/07/2020 in [facility] Cardiac and Pulmonary Rehab  Referring Provider Jordan, Peter MD      Encounter Date: 08/03/2020  Check In:  Session Check In - 08/03/20 0832      Check-In   Supervising physician immediately available to respond to emergencies See telemetry face sheet for immediately available ER MD    Location [facility]    Staff Present  , RN, BSN, CCRP;Kelly Hayes, BS, ACSM CEP, Exercise Physiologist;Joseph Hood RCP,RRT,BSRT    Virtual Visit No    Medication changes reported     No    Fall or balance concerns reported    No    Warm-up and Cool-down Performed on first and last piece of equipment    Resistance Training Performed Yes    VAD Patient? No    PAD/SET Patient? No      Pain Assessment   Currently in Pain? No/denies              Social History   Tobacco Use  Smoking Status Former Smoker  . Packs/day: 1.00  . Years: 22.00  . Pack years: 22.00  . Types: Cigarettes, Pipe  . Quit date: 12/17/2014  . Years since quitting: 5.6  Smokeless Tobacco Never Used    Goals Met:  Independence with exercise equipment Exercise tolerated well No report of cardiac concerns or symptoms  Goals Unmet:  Not Applicable  Comments: Pt able to follow exercise prescription today without complaint.  Will continue to monitor for progression.    Dr. Mark Miller is Medical Director for [facility] and [facility]. 

## 2020-08-04 ENCOUNTER — Other Ambulatory Visit: Payer: Self-pay | Admitting: Physician Assistant

## 2020-08-05 ENCOUNTER — Encounter: Payer: BC Managed Care – PPO | Attending: Cardiology | Admitting: *Deleted

## 2020-08-05 ENCOUNTER — Other Ambulatory Visit: Payer: Self-pay

## 2020-08-05 DIAGNOSIS — I213 ST elevation (STEMI) myocardial infarction of unspecified site: Secondary | ICD-10-CM

## 2020-08-05 DIAGNOSIS — Z955 Presence of coronary angioplasty implant and graft: Secondary | ICD-10-CM | POA: Diagnosis not present

## 2020-08-05 DIAGNOSIS — I252 Old myocardial infarction: Secondary | ICD-10-CM | POA: Diagnosis present

## 2020-08-05 NOTE — Progress Notes (Addendum)
Daily Session Note  Patient Details  Name: [name] MRN: [id number] Date of Birth: 05/22/1946 Referring Provider:     Cardiac Rehab from 05/07/2020 in [facility]  Referring Provider [name], Peter MD      Encounter Date: 08/05/2020  Check In:      Social History   Tobacco Use  Smoking Status Former Smoker  . Packs/day: 1.00  . Years: 22.00  . Pack years: 22.00  . Types: Cigarettes, Pipe  . Quit date: 12/17/2014  . Years since quitting: 5.6  Smokeless Tobacco Never Used    Goals Met:  Independence with exercise equipment Exercise tolerated well No report of cardiac concerns or symptoms  Goals Unmet:  Not Applicable  Comments: Pt able to follow exercise prescription today without complaint.  Will continue to monitor for progression.   Tyler "Laurey Arrow" graduated today from  rehab with 36 sessions completed.  Details of the patient's exercise prescription and what He needs to do in order to continue the prescription and progress were discussed with patient.  Patient was given a copy of prescription and goals.  Patient verbalized understanding.  [name] plans to continue to exercise by working at home and walking.   Dr. Emily Filbert is Medical Director for [place] and [facility].

## 2020-08-05 NOTE — Patient Instructions (Signed)
Discharge Patient Instructions  Patient Details  Name: [name] MRN: [id number] Date of Birth: [birth year] Referring Provider:  Martinique, Peter M, MD   Number of Visits: 36  Reason for Discharge:  Patient reached a stable level of exercise. Patient has met program and personal goals.  Smoking History:  Social History   Tobacco Use  Smoking Status Former Smoker  . Packs/day: 1.00  . Years: 22.00  . Pack years: 22.00  . Types: Cigarettes, Pipe  . Quit date: 12/17/2014  . Years since quitting: 5.6  Smokeless Tobacco Never Used    Diagnosis:  ST elevation myocardial infarction (STEMI), unspecified artery (HCC)  Status post coronary artery stent placement  Initial Exercise Prescription:  Initial Exercise Prescription - 05/07/20 1100      Date of Initial Exercise RX and Referring Provider   Date 05/07/20    Referring Provider Martinique, Peter MD      Treadmill   MPH 2.3    Grade 0.5    Minutes 15    METs 2.92      Recumbant Bike   Level 2    RPM 50    Watts 19    Minutes 15    METs 2      NuStep   Level 1    SPM 80    Minutes 15    METs 2      Arm Ergometer   Level 2    Watts 30    RPM 25    Minutes 15    METs 2      REL-XR   Level 1    Speed 50    Minutes 15    METs 2      T5 Nustep   Level 1    SPM 80    Minutes 15    METs 2      Prescription Details   Frequency (times per week) 2    Duration Progress to 30 minutes of continuous aerobic without signs/symptoms of physical distress      Intensity   THRR 40-80% of Max Heartrate 93-128    Ratings of Perceived Exertion 11-13    Perceived Dyspnea 0-4      Progression   Progression Continue to progress workloads to maintain intensity without signs/symptoms of physical distress.      Resistance Training   Training Prescription Yes    Weight 3 lb    Reps 10-15           Functional Capacity:  6 Minute Walk    Row Name 05/07/20 1053 07/29/20 0922       6 Minute Walk   Phase  Initial Discharge    Distance 1230 feet 1282 feet    Distance % Change -- 4.22 %    Distance Feet Change -- 52 ft    Walk Time 6 minutes 6 minutes    # of Rest Breaks 0 0    MPH 2.33 2.42    METS 2.83 3.15    RPE 7 9    Perceived Dyspnea  -- 0    VO2 Peak 9.91 11.05    Symptoms No No    Resting HR 58 bpm 58 bpm    Resting BP 114/72 136/64    Resting Oxygen Saturation  97 % --    Exercise Oxygen Saturation  during 6 min walk 97 % --    Max Ex. HR 84 bpm 101 bpm    Max Ex. BP 156/66 160/64  2 Minute Post BP 104/58 146/70           Nutrition & Weight - Outcomes:  Pre Biometrics - 07/29/20 0926      Pre Biometrics   Single Leg Stand 3.2 seconds            Nutrition:  Nutrition Therapy & Goals - 05/20/20 0825      Nutrition Therapy   Diet heart healthy, low Na    Protein (specify units) 55-60g    Fiber 30 grams    Whole Grain Foods 3 servings    Saturated Fats 12 max. grams    Fruits and Vegetables 5 servings/day    Sodium 1.5 grams      Personal Nutrition Goals   Nutrition Goal ST: none at this time as wife does the cooking and his memory is poor LT: get healthier and be more active (yard work, work around [employer]), lower LDL, increase HDL    Comments Whatever his wife cooks. 3  light meals. cereal (cheerios and raisin bran- whole milk) in morning and sometimes bacon and eggs. L: sandwich (peanut butter and  banana on whole wheat)  or cooked meal (beans, peas, and cornbread, cabbage) D: chicken but will mix it up. Wife is a Librarian, academic at the cancer center. Memory has been worse since heart attack. Discussed heart healthy eating. Pt takes BG 3x/day, but does not remember what the numbers usually are.      Intervention Plan   Intervention Prescribe, educate and counsel regarding individualized specific dietary modifications aiming towards targeted core components such as weight, hypertension, lipid management, diabetes, heart failure and other comorbidities.;Nutrition  handout(s) given to patient.    Expected Outcomes Short Term Goal: Understand basic principles of dietary content, such as calories, fat, sodium, cholesterol and nutrients.;Short Term Goal: A plan has been developed with personal nutrition goals set during dietitian appointment.;Long Term Goal: Adherence to prescribed nutrition plan.           Goals reviewed with patient; copy given to patient.

## 2020-08-05 NOTE — Progress Notes (Signed)
Cardiac Individual Treatment Plan  Patient Details  Name: [name] MRN: [id number] Date of Birth: 03/24/1946 Referring Provider:     Cardiac Rehab from 05/07/2020 in [facility] Cardiac and Pulmonary Rehab  Referring Provider [place], Peter MD      Initial Encounter Date:    Cardiac Rehab from 05/07/2020 in [facility] Cardiac and Pulmonary Rehab  Date 05/07/20      Visit Diagnosis: ST elevation myocardial infarction (STEMI), unspecified artery [facility])  Status post coronary artery stent placement  Patient's Home Medications on Admission:  Current Outpatient Medications:  .  acetaminophen (TYLENOL) 650 MG CR tablet, Take 1,300 mg by mouth every 8 (eight) hours as needed for pain., Disp: , Rfl:  .  Alirocumab (PRALUENT) 150 MG/ML SOAJ, Inject 150 mg into the skin every 14 (fourteen) days., Disp: 2 pen, Rfl: 11 .  aspirin EC 81 MG tablet, Take 1 tablet (81 mg total) by mouth daily., Disp:  , Rfl:  .  B Complex-C (B-COMPLEX WITH VITAMIN C) tablet, Take 1 tablet by mouth daily with breakfast. , Disp: , Rfl:  .  cetirizine (ZYRTEC) 10 MG tablet, Take 10 mg by mouth daily.  , Disp: , Rfl:  .  clopidogrel (PLAVIX) 75 MG tablet, Take 1 tablet (75 mg total) by mouth daily with breakfast., Disp: 30 tablet, Rfl: 11 .  empagliflozin (JARDIANCE) 10 MG TABS tablet, Take 25 mg by mouth daily. , Disp: , Rfl:  .  fluticasone (FLONASE) 50 MCG/ACT nasal spray, Place 2 sprays into both nostrils daily as needed for allergies. , Disp: , Rfl:  .  isosorbide mononitrate (IMDUR) 30 MG 24 hr tablet, Take 0.5 tablets (15 mg total) by mouth daily., Disp: 16 tablet, Rfl: 6 .  lansoprazole (PREVACID) 15 MG capsule, Take 15 mg by mouth daily.  , Disp: , Rfl:  .  lisinopril (ZESTRIL) 20 MG tablet, Take 1 tablet (20 mg total) by mouth daily., Disp: 30 tablet, Rfl: 1 .  metoprolol tartrate 37.5 MG TABS, Take 37.5 mg by mouth 2 (two) times daily., Disp: 90 tablet, Rfl: 6 .  Multiple Minerals-Vitamins (CALCIUM-MAGNESIUM-ZINC-D3)  TABS, Take 1 tablet by mouth daily with lunch. , Disp: , Rfl:  .  nitroGLYCERIN (NITROSTAT) 0.4 MG SL tablet, Place 1 tablet (0.4 mg total) under the tongue every 5 (five) minutes as needed., Disp: 25 tablet, Rfl: 12 .  OneTouch Delica Lancets 53Y MISC, 1 each by Other route daily., Disp: , Rfl:  .  ONETOUCH VERIO test strip, 1 each by Other route daily., Disp: , Rfl:  .  Tamsulosin HCl (FLOMAX) 0.4 MG CAPS, Take 0.4 mg by mouth daily after supper. , Disp: , Rfl:   Past Medical History: Past Medical History:  Diagnosis Date  . Arthritis   . Arthritis   . Atrial fibrillation ([place])   . BPH (benign prostatic hypertrophy)   . CAD (coronary artery disease)    stents placed  . Diabetes mellitus   . Diverticulosis   . Dyspnea    when exerting self  . Dysrhythmia    "extra beats"  . Esophageal stricture   . GERD (gastroesophageal reflux disease)   . Headache(784.0)   . Hemorrhoids   . Hernia, inguinal, right   . Hiatal hernia   . Hyperlipidemia   . Hypertension   . Pneumonia   . Staph infection    Left ankle  . 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 Urinary hesitancy     Tobacco Use: Social History  Tobacco Use  Smoking Status Former Smoker  . Packs/day: 1.00  . Years: 22.00  . Pack years: 22.00  . Types: Cigarettes, Pipe  . Quit date: 12/17/2014  . Years since quitting: 5.6  Smokeless Tobacco Never Used    Labs: Recent Review Flowsheet Data    Labs for ITP Cardiac and Pulmonary Rehab Latest Ref Rng & Units 12/22/2019 12/23/2019 03/16/2020 03/17/2020 06/25/2020   Cholestrol 100 - 199 mg/dL - - 205(H) - 101   LDLCALC 0 - 99 mg/dL - - 137(H) - 38   HDL >39 mg/dL - - 47 - 48   Trlycerides 0 - 149 mg/dL - - 107 - 70   Hemoglobin A1c 4.8 - 5.6 % - - 7.2(H) 7.2(H) -   PHART 7.35 - 7.45 - - - - -   PCO2ART 32 - 48 mmHg - - - - -   HCO3 20.0 - 28.0 mmol/L - - - - -   TCO2 22 - 32 mmol/L - - 26 - -   ACIDBASEDEF 0.0 - 2.0 mmol/L - - - - -   O2SAT % 54.1 72.7 - - -        Exercise Target Goals: Exercise Program Goal: Individual exercise prescription set using results from initial 6 min walk test and THRR while considering  patient's activity barriers and safety.   Exercise Prescription Goal: Initial exercise prescription builds to 30-45 minutes a day of aerobic activity, 2-3 days per week.  Home exercise guidelines will be given to patient during program as part of exercise prescription that the participant will acknowledge.   Education: Aerobic Exercise & Resistance Training: - Gives group verbal and written instruction on the various components of exercise. Focuses on aerobic and resistive training programs and the benefits of this training and how to safely progress through these programs..   Cardiac Rehab from 08/05/2020 in [facility] Cardiac and Pulmonary Rehab  Date 07/29/20  Educator [facility]  Instruction Review Code 1- Verbalizes Understanding      Education: Exercise & Equipment Safety: - Individual verbal instruction and demonstration of equipment use and safety with use of the equipment.   Cardiac Rehab from 08/05/2020 in [facility] Cardiac and Pulmonary Rehab  Date 05/07/20  Educator [facility]  Instruction Review Code 1- Verbalizes Understanding      Education: Exercise Physiology & General Exercise Guidelines: - Group verbal and written instruction with models to review the exercise physiology of the cardiovascular system and associated critical values. Provides general exercise guidelines with specific guidelines to those with heart or lung disease.    Education: Flexibility, Balance, Mind/Body Relaxation: Provides group verbal/written instruction on the benefits of flexibility and balance training, including mind/body exercise modes such as yoga, pilates and tai chi.  Demonstration and skill practice provided.   Cardiac Rehab from 08/05/2020 in [facility] Cardiac and Pulmonary Rehab  Date 08/05/20  Educator [facility]  Instruction Review Code 1- Verbalizes Understanding       Activity Barriers & Risk Stratification:  Activity Barriers & Cardiac Risk Stratification - 04/24/20 1403      Activity Barriers & Cardiac Risk Stratification   Activity Barriers Back Problems;Arthritis   hx knee surgery   Cardiac Risk Stratification High           6 Minute Walk:  6 Minute Walk    Row Name 05/07/20 1053 07/29/20 0922       6 Minute Walk   Phase Initial Discharge    Distance 1230 feet 1282 feet  Distance % Change -- 4.22 %    Distance Feet Change -- 52 ft    Walk Time 6 minutes 6 minutes    # of Rest Breaks 0 0    MPH 2.33 2.42    METS 2.83 3.15    RPE 7 9    Perceived Dyspnea  -- 0    VO2 Peak 9.91 11.05    Symptoms No No    Resting HR 58 bpm 58 bpm    Resting BP 114/72 136/64    Resting Oxygen Saturation  97 % --    Exercise Oxygen Saturation  during 6 min walk 97 % --    Max Ex. HR 84 bpm 101 bpm    Max Ex. BP 156/66 160/64    2 Minute Post BP 104/58 146/70           Oxygen Initial Assessment:   Oxygen Re-Evaluation:   Oxygen Discharge (Final Oxygen Re-Evaluation):   Initial Exercise Prescription:  Initial Exercise Prescription - 05/07/20 1100      Date of Initial Exercise RX and Referring Provider   Date 05/07/20    Referring Provider [place], Peter MD      Treadmill   MPH 2.3    Grade 0.5    Minutes 15    METs 2.92      Recumbant Bike   Level 2    RPM 50    Watts 19    Minutes 15    METs 2      NuStep   Level 1    SPM 80    Minutes 15    METs 2      Arm Ergometer   Level 2    Watts 30    RPM 25    Minutes 15    METs 2      REL-XR   Level 1    Speed 50    Minutes 15    METs 2      T5 Nustep   Level 1    SPM 80    Minutes 15    METs 2      Prescription Details   Frequency (times per week) 2    Duration Progress to 30 minutes of continuous aerobic without signs/symptoms of physical distress      Intensity   THRR 40-80% of Max Heartrate 93-128    Ratings of Perceived Exertion 11-13     Perceived Dyspnea 0-4      Progression   Progression Continue to progress workloads to maintain intensity without signs/symptoms of physical distress.      Resistance Training   Training Prescription Yes    Weight 3 lb    Reps 10-15           Perform Capillary Blood Glucose checks as needed.  Exercise Prescription Changes:  Exercise Prescription Changes    Row Name 05/07/20 1100 05/19/20 1300 06/02/20 0700 06/16/20 1200 06/30/20 1300     Response to Exercise   Blood Pressure (Admit) 114/72 124/60 126/72 136/74 122/62   Blood Pressure (Exercise) 152/66 152/64 130/58 128/70 122/70   Blood Pressure (Exit) 104/58 122/76 118/58 142/64 122/60   Heart Rate (Admit) 58 bpm 64 bpm 99 bpm 69 bpm 100 bpm   Heart Rate (Exercise) 84 bpm 115 bpm 109 bpm 119 bpm 119 bpm   Heart Rate (Exit) 59 bpm 66 bpm 70 bpm 108 bpm 75 bpm   Oxygen Saturation (Admit) 97 % -- -- -- --  Oxygen Saturation (Exercise) 97 % -- -- -- --   Rating of Perceived Exertion (Exercise) _0 Symptoms _1    Comments walk test results -- -- -- --   Duration -- -- Continue with 30 min of aerobic exercise without signs/symptoms of physical distress. Continue with 30 min of aerobic exercise without signs/symptoms of physical distress. Continue with 30 min of aerobic exercise without signs/symptoms of physical distress.   Intensity -- -- THRR unchanged THRR unchanged THRR unchanged     Progression   Progression -- Continue to progress workloads to maintain intensity without signs/symptoms of physical distress. Continue to progress workloads to maintain intensity without signs/symptoms of physical distress. Continue to progress workloads to maintain intensity without signs/symptoms of physical distress. Continue to progress workloads to maintain intensity without signs/symptoms of physical distress.   Average METs -- 2.8 3 3.7 3.24     Resistance Training   Training Prescription -- Yes Yes Yes Yes    Weight -- 3 lb 3 lb 3 lb 4 lb   Reps -- 10-15 10-15 10-15 10-15     Interval Training   Interval Training -- No No No No     Treadmill   MPH -- 2.3 2.6 2.6 2.6   Grade -- 0._2 Minutes -- _3 METs -- 2.92 3.71 3.71 3.71     Recumbant Bike   Level -- -- -- -- 3   Watts -- -- -- -- 26   Minutes -- -- -- -- 15   METs -- -- -- -- 3.21     NuStep   Level -- -- -- -- 3   Minutes -- -- -- -- 15   METs -- -- -- -- 2.9     Elliptical   Level -- -- 1 -- --   Speed -- -- 2.3 -- --   Minutes -- -- 15 -- --   METs -- -- 2.4 -- --     REL-XR   Level -- _4 Speed -- 50 -- 50 --   Minutes -- _5 METs -- 2.6 -- -- --   Row Name 07/14/20 1300 07/28/20 1300 07/29/20 0900         Response to Exercise   Blood Pressure (Admit) 110/60 126/60 --     Blood Pressure (Exercise) 142/80 128/64 --     Blood Pressure (Exit) 126/64 132/70 --     Heart Rate (Admit) 56 bpm 56 bpm --     Heart Rate (Exercise) 105 bpm 110 bpm --     Heart Rate (Exit) 63 bpm 70 bpm --     Rating of Perceived Exertion (Exercise) 14 14 --     Symptoms none none --     Duration Continue with 30 min of aerobic exercise without signs/symptoms of physical distress. Continue with 30 min of aerobic exercise without signs/symptoms of physical distress. --     Intensity THRR unchanged THRR unchanged --       Progression   Progression Continue to progress workloads to maintain intensity without signs/symptoms of physical distress. Continue to progress workloads to maintain intensity without signs/symptoms of physical distress. --     Average METs 3.5 3.42 --       Resistance Training   Training Prescription Yes Yes --     Weight 4 lb 4 lb --  Reps 10-15 10-15 --       Interval Training   Interval Training No No --       Recumbant Bike   Level 3 3 --     RPM 50 -- --     Watts -- 30 --     Minutes 15 15 --     METs 2.85 3.35 --       NuStep   Level 5 5 --     SPM 80 -- --      Minutes 15 15 --     METs 4.1 3.5 --       Home Exercise Plan   Plans to continue exercise at -- -- Home (comment)  walking     Frequency -- -- Add 2 additional days to program exercise sessions.     Initial Home Exercises Provided -- -- 07/29/20            Exercise Comments:  Exercise Comments    Row Name 05/11/20 3704 08/05/20 0933         Exercise Comments First full day of exercise!  Patient was oriented to gym and equipment including functions, settings, policies, and procedures.  Patient's individual exercise prescription and treatment plan were reviewed.  All starting workloads were established based on the results of the 6 minute walk test done at initial orientation visit.  The plan for exercise progression was also introduced and progression will be customized based on patient's performance and goals. Tyler "[name]" graduated today from  rehab with 36 sessions completed.  Details of the patient's exercise prescription and what He needs to do in order to continue the prescription and progress were discussed with patient.  Patient was given a copy of prescription and goals.  Patient verbalized understanding.  [name] plans to continue to exercise by working at home and walking.             Exercise Goals and Review:  Exercise Goals    Row Name 05/07/20 1114             Exercise Goals   Increase Physical Activity Yes       Intervention Provide advice, education, support and counseling about physical activity/exercise needs.;Develop an individualized exercise prescription for aerobic and resistive training based on initial evaluation findings, risk stratification, comorbidities and participant's personal goals.       Expected Outcomes Short Term: Attend rehab on a regular basis to increase amount of physical activity.;Long Term: Add in home exercise to make exercise part of routine and to increase amount of physical activity.;Long Term: Exercising regularly at least 3-5  days a week.       Increase Strength and Stamina Yes       Intervention Provide advice, education, support and counseling about physical activity/exercise needs.;Develop an individualized exercise prescription for aerobic and resistive training based on initial evaluation findings, risk stratification, comorbidities and participant's personal goals.       Expected Outcomes Short Term: Increase workloads from initial exercise prescription for resistance, speed, and METs.;Long Term: Improve cardiorespiratory fitness, muscular endurance and strength as measured by increased METs and functional capacity (6MWT);Short Term: Perform resistance training exercises routinely during rehab and add in resistance training at home       Able to understand and use rate of perceived exertion (RPE) scale Yes       Intervention Provide education and explanation on how to use RPE scale       Expected Outcomes Short Term:  Able to use RPE daily in rehab to express subjective intensity level;Long Term:  Able to use RPE to guide intensity level when exercising independently       Able to understand and use Dyspnea scale Yes       Intervention Provide education and explanation on how to use Dyspnea scale       Expected Outcomes Short Term: Able to use Dyspnea scale daily in rehab to express subjective sense of shortness of breath during exertion;Long Term: Able to use Dyspnea scale to guide intensity level when exercising independently       Knowledge and understanding of Target Heart Rate Range (THRR) Yes       Intervention Provide education and explanation of THRR including how the numbers were predicted and where they are located for reference       Expected Outcomes Short Term: Able to state/look up THRR;Short Term: Able to use daily as guideline for intensity in rehab;Long Term: Able to use THRR to govern intensity when exercising independently       Able to check pulse independently Yes       Intervention Provide  education and demonstration on how to check pulse in carotid and radial arteries.;Review the importance of being able to check your own pulse for safety during independent exercise       Expected Outcomes Short Term: Able to explain why pulse checking is important during independent exercise;Long Term: Able to check pulse independently and accurately       Understanding of Exercise Prescription Yes       Intervention Provide education, explanation, and written materials on patient's individual exercise prescription       Expected Outcomes Short Term: Able to explain program exercise prescription;Long Term: Able to explain home exercise prescription to exercise independently              Exercise Goals Re-Evaluation :  Exercise Goals Re-Evaluation    Row Name 05/11/20 0958 05/19/20 1355 06/02/20 0751 06/16/20 1248 06/30/20 1322     Exercise Goal Re-Evaluation   Exercise Goals Review Increase Physical Activity;Increase Strength and Stamina;Able to understand and use rate of perceived exertion (RPE) scale;Able to understand and use Dyspnea scale;Knowledge and understanding of Target Heart Rate Range (THRR);Understanding of Exercise Prescription -- Increase Physical Activity;Increase Strength and Stamina;Understanding of Exercise Prescription Increase Physical Activity;Increase Strength and Stamina;Understanding of Exercise Prescription Increase Physical Activity;Increase Strength and Stamina;Understanding of Exercise Prescription   Comments Reviewed RPE and dyspnea scales, THR and program prescription with pt today.  Pt voiced understanding and was given a copy of goals to take home. [name] has tolerated exercise well in hs first full week.  Staff will monitor progress. [name] is doing well in rehab.  He still needs to be reminded about what to do each day.  We have encouraged him to watch [place] during weights, but he continues to just follow the other patients instead of listening to staff.  He is on level 4  for the XR.  We will continue to monitor his progress. [name] has only attended once this month so far. Regular attendance will help him progress. [name] is doing well in rehab.  He has now decided to add in a third day for rehab to improve his stamina even more.  He is up to level 4 on the XR.  We will continue to monitor his progress.   Expected Outcomes Short: Use RPE daily to regulate intensity. Long: Follow program prescription in THR. Short: attend  consistently Long:  improve overall stamina Short: Increase hand weights Long: Continue to improve stamina. Short:  attend consistently Long: increase stamina and MET level Short: Continue to increase workloads Long: Continue to improve stamina.   [name] Name 07/14/20 1334 07/28/20 1344 07/29/20 0818         Exercise Goal Re-Evaluation   Exercise Goals Review Increase Physical Activity;Increase Strength and Stamina;Understanding of Exercise Prescription Increase Physical Activity;Increase Strength and Stamina;Understanding of Exercise Prescription Increase Physical Activity;Increase Strength and Stamina;Understanding of Exercise Prescription     Comments [name] is progressing well.  He has increased levels on NS and improved average MET level.  He works in correct THR range. [name] has done well in the program.  He is nearing graduation and we expect to see an improvement in his post 6MWT.  He plans to continue to exercise by walking at home. We will continue to monitor his progress. Reviewed home exercise with pt today.  Pt plans to walk for exercise.  Reviewed THR, pulse, RPE, sign and symptoms, pulse oximetery and when to call 911 or MD.  Also discussed weather considerations and indoor options.  Pt voiced understanding. [name] is graduating next week. He improved his 6MWT by around 60 feet. He plans to walk at home, encouraged to reach continuous 30 minutes of exercise. Patient has done well in this program and will plan to discharge next week.     Expected Outcomes  Short: continue to exercise consistently Long: continue to improve stamina Short: Improve post 6MWT and review home exercise guidelines  Long: Continue to improve stamina. Short: Graduate Long: Exercise at home independently without signs or complications            Discharge Exercise Prescription (Final Exercise Prescription Changes):  Exercise Prescription Changes - 07/29/20 0900      Home Exercise Plan   Plans to continue exercise at Home (comment)   walking   Frequency Add 2 additional days to program exercise sessions.    Initial Home Exercises Provided 07/29/20           Nutrition:  Target Goals: Understanding of nutrition guidelines, daily intake of sodium <1530m, cholesterol <209m calories 30% from fat and 7% or less from saturated fats, daily to have 5 or more servings of fruits and vegetables.  Education: Controlling Sodium/Reading Food Labels -Group verbal and written material supporting the discussion of sodium use in heart healthy nutrition. Review and explanation with models, verbal and written materials for utilization of the food label.   Education: General Nutrition Guidelines/Fats and Fiber: -Group instruction provided by verbal, written material, models and posters to present the general guidelines for heart healthy nutrition. Gives an explanation and review of dietary fats and fiber.   [facility]  Date 06/17/20  [facility] Review Code 1- Verbalizes Understanding      Biometrics:  Pre Biometrics - 07/29/20 0926      Pre Biometrics   Single Leg Stand 3.2 seconds            Nutrition Therapy Plan and Nutrition Goals:  Nutrition Therapy & Goals - 05/20/20 0825      Nutrition Therapy   Diet heart healthy, low Na    Protein (specify units) 55-60g    Fiber 30 grams    Whole Grain Foods 3 servings    Saturated Fats 12 max. grams    Fruits and Vegetables 5 servings/day    Sodium  1.5 grams      Personal Nutrition Goals   Nutrition Goal ST: none at this time as wife does the cooking and his memory is poor LT: get healthier and be more active (yard work, work around [employer]), lower LDL, increase HDL    Comments Whatever his wife cooks. 3  light meals. cereal (cheerios and raisin bran- whole milk) in morning and sometimes bacon and eggs. L: sandwich (peanut butter and  banana on whole wheat)  or cooked meal (beans, peas, and cornbread, cabbage) D: chicken but will mix it up. Wife is a Librarian, academic at the cancer center. Memory has been worse since heart attack. Discussed heart healthy eating. Pt takes BG 3x/day, but does not remember what the numbers usually are.      Intervention Plan   Intervention Prescribe, educate and counsel regarding individualized specific dietary modifications aiming towards targeted core components such as weight, hypertension, lipid management, diabetes, heart failure and other comorbidities.;Nutrition handout(s) given to patient.    Expected Outcomes Short Term Goal: Understand basic principles of dietary content, such as calories, fat, sodium, cholesterol and nutrients.;Short Term Goal: A plan has been developed with personal nutrition goals set during dietitian appointment.;Long Term Goal: Adherence to prescribed nutrition plan.           Nutrition Assessments:  Nutrition Assessments - 05/11/20 0927      MEDFICTS Scores   Pre Score 66           MEDIFICTS Score Key:          ?70 Need to make dietary changes          40-70 Heart Healthy Diet         ? 40 Therapeutic Level Cholesterol Diet  Nutrition Goals Re-Evaluation:  Nutrition Goals Re-Evaluation    [place] Name 06/26/20 0757 07/29/20 0813           Goals   Nutrition Goal ST: none at this time as wife does the cooking and his memory is poor LT: get healthier and be more active (yard work, work around [employer]), lower LDL, increase HDL ST: none at this time as wife does the  cooking and his memory is poor LT: Continue with current diet      Comment Briefly checked in with pt regarding nutrition. No new changes at this time. Briefly checked in with pt regarding nutrition. No new changes at this time. Pt will continue with eating what his wife cooks after graduation.      Expected Outcome ST: none at this time as wife does the cooking and his memory is poor LT: get healthier and be more active (yard work, work around [employer]), lower LDL, increase HDL ST: none at this time as wife does the cooking and his memory is poor LT: Continue with current diet             Nutrition Goals Discharge (Final Nutrition Goals Re-Evaluation):  Nutrition Goals Re-Evaluation - 07/29/20 0813      Goals   Nutrition Goal ST: none at this time as wife does the cooking and his memory is poor LT: Continue with current diet    Comment Briefly checked in with pt regarding nutrition. No new changes at this time. Pt will continue with eating what his wife cooks after graduation.    Expected Outcome ST: none at this time as wife does the cooking and his memory is poor LT: Continue with current diet  Psychosocial: Target Goals: Acknowledge presence or absence of significant depression and/or stress, maximize coping skills, provide positive support system. Participant is able to verbalize types and ability to use techniques and skills needed for reducing stress and depression.   Education: Depression - Provides group verbal and written instruction on the correlation between heart/lung disease and depressed mood, treatment options, and the stigmas associated with seeking treatment.   Cardiac Rehab from 08/05/2020 in [facility] Cardiac and Pulmonary Rehab  Date 07/08/20  Educator [facility]  Instruction Review Code 1- [employer] Understanding      Education: Sleep Hygiene -Provides group verbal and written instruction about how sleep can affect your health.  Define sleep hygiene, discuss sleep  cycles and impact of sleep habits. Review good sleep hygiene tips.     Education: Stress and Anxiety: - Provides group verbal and written instruction about the health risks of elevated stress and causes of high stress.  Discuss the correlation between heart/lung disease and anxiety and treatment options. Review healthy ways to manage with stress and anxiety.    Initial Review & Psychosocial Screening:  Initial Psych Review & Screening - 04/24/20 1407      Initial Review   Current issues with Current Stress Concerns    Source of Stress Concerns Chronic Illness      Family Dynamics   Good Support System? Yes      Barriers   Psychosocial barriers to participate in program There are no identifiable barriers or psychosocial needs.;The patient should benefit from training in stress management and relaxation.      Screening Interventions   Interventions Encouraged to exercise;To provide support and resources with identified psychosocial needs;Provide feedback about the scores to participant    Expected Outcomes Short Term goal: Utilizing psychosocial counselor, staff and physician to assist with identification of specific Stressors or current issues interfering with healing process. Setting desired goal for each stressor or current issue identified.;Long Term Goal: Stressors or current issues are controlled or eliminated.;Short Term goal: Identification and review with participant of any Quality of Life or Depression concerns found by scoring the questionnaire.;Long Term goal: The participant improves quality of Life and PHQ9 Scores as seen by post scores and/or verbalization of changes           Quality of Life Scores:   Quality of Life - 05/11/20 0928      Quality of Life   Select Quality of Life      Quality of Life Scores   Health/Function Pre 22.4 %    Socioeconomic Pre 22.58 %    Psych/Spiritual Pre 24 %    Family Pre 20.4 %    GLOBAL Pre 22.47 %          Scores of 19 and  below usually indicate a poorer quality of life in these areas.  A difference of  2-3 points is a clinically meaningful difference.  A difference of 2-3 points in the total score of the Quality of Life Index has been associated with significant improvement in overall quality of life, self-image, physical symptoms, and general health in studies assessing change in quality of life.  PHQ-9: Recent Review Flowsheet Data    Depression screen [facility] 2/9 05/11/2020   Decreased Interest 0   Down, Depressed, Hopeless 0   PHQ - 2 Score 0   Altered sleeping 0   Tired, decreased energy 0   Change in appetite 0   Feeling bad or failure about yourself  0   Trouble concentrating  0   Moving slowly or fidgety/restless 0   Suicidal thoughts 0   PHQ-9 Score 0   Difficult doing work/chores Not difficult at all     Interpretation of Total Score  Total Score Depression Severity:  1-4 = Minimal depression, 5-9 = Mild depression, 10-14 = Moderate depression, 15-19 = Moderately severe depression, 20-27 = Severe depression   Psychosocial Evaluation and Intervention:  Psychosocial Evaluation - 07/29/20 0910      Psychosocial Evaluation & Interventions   Comments [name] sleeps well with no complaints. Wife is supportive. Denies any big stressors at this time. [name] is graduating next week and plans to use exercise for continuous stress management and maintain a positive attitude.    Expected Outcomes Short: Graduate Long: Continue to maintain positive attitude and utilize exercise for stress management    Continue Psychosocial Services  Follow up required by staff      Discharge Psychosocial Assessment & Intervention   Comments [name] did well during the program and looks forward to continue to walk after he graduates from [employer] [facility] [employer] [facility].           Psychosocial Re-Evaluation:  Psychosocial Re-Evaluation    [place] Name 06/03/20 [phone] 07/01/20 0750 07/29/20 0809         Psychosocial Re-Evaluation   Current issues with  None Identified None Identified None Identified     Comments Patient reports no issues with their current mental states, sleep, stress, depression or anxiety. Will follow up with patient in a few weeks for any changes. [name] didnt sleep well last night for the first time since his event.  Otherwise he sleeps well and has no other issues. --     Expected Outcomes Short: Continue to exercise regularly to support mental health and notify staff of any changes. Long: maintain mental health and well being through teaching of rehab or prescribed medications independently. Short: Continue to exercise regularly to support mental health and notify staff of any changes. Long: maintain mental health and well being through teaching of rehab or prescribed medications independently. --     Interventions Encouraged to attend Cardiac Rehabilitation for the exercise Encouraged to attend Cardiac Rehabilitation for the exercise Encouraged to attend Cardiac Rehabilitation for the exercise     Continue Psychosocial Services  Follow up required by staff -- --            Psychosocial Discharge (Final Psychosocial Re-Evaluation):  Psychosocial Re-Evaluation - 07/29/20 0809      Psychosocial Re-Evaluation   Current issues with None Identified    Comments --    Expected Outcomes --    Interventions Encouraged to attend Cardiac Rehabilitation for the exercise           Vocational Rehabilitation: Provide vocational rehab assistance to qualifying candidates.   Vocational Rehab Evaluation & Intervention:  Vocational Rehab - 04/24/20 1407      Initial Vocational Rehab Evaluation & Intervention   Assessment shows need for Vocational Rehabilitation No           Education: Education Goals: Education classes will be provided on a variety of topics geared toward better understanding of heart health and risk factor modification. Participant will state understanding/return demonstration of topics presented as noted by  education test scores.  Learning Barriers/Preferences:  Learning Barriers/Preferences - 04/24/20 1408      Learning Barriers/Preferences   Learning Barriers None    Learning Preferences None           General Cardiac Education Topics:  AED/CPR: -  Group verbal and written instruction with the use of models to demonstrate the basic use of the AED with the basic ABC's of resuscitation.   Anatomy & Physiology of the Heart: - Group verbal and written instruction and models provide basic cardiac anatomy and physiology, with the coronary electrical and arterial systems. Review of Valvular disease and Heart Failure   Cardiac Procedures: - Group verbal and written instruction to review commonly prescribed medications for heart disease. Reviews the medication, class of the drug, and side effects. Includes the steps to properly store meds and maintain the prescription regimen. (beta blockers and nitrates)   Cardiac Rehab from 08/05/2020 in [facility] Cardiac and Pulmonary Rehab  Date 07/29/20  Educator SB  Instruction Review Code 1- Verbalizes Understanding      Cardiac Medications I: - Group verbal and written instruction to review commonly prescribed medications for heart disease. Reviews the medication, class of the drug, and side effects. Includes the steps to properly store meds and maintain the prescription regimen.   Cardiac Rehab from 08/05/2020 in [facility] Cardiac and Pulmonary Rehab  Date 07/01/20  Educator SB  Instruction Review Code 1- Verbalizes Understanding      Cardiac Medications II: -Group verbal and written instruction to review commonly prescribed medications for heart disease. Reviews the medication, class of the drug, and side effects. (all other drug classes)   Cardiac Rehab from 08/05/2020 in [facility] Cardiac and Pulmonary Rehab  Date 07/15/20  Educator [facility]  Instruction Review Code 1- Verbalizes Understanding       Go Sex-Intimacy & Heart Disease, Get SMART - Goal  Setting: - Group verbal and written instruction through game format to discuss heart disease and the return to sexual intimacy. Provides group verbal and written material to discuss and apply goal setting through the application of the S.M.A.R.T. Method.   Cardiac Rehab from 08/05/2020 in [facility] Cardiac and Pulmonary Rehab  Date 07/29/20  Educator SB  Instruction Review Code 1- Verbalizes Understanding      Other Matters of the Heart: - Provides group verbal, written materials and models to describe Stable Angina and Peripheral Artery. Includes description of the disease process and treatment options available to the cardiac patient.   Infection Prevention: - Provides verbal and written material to individual with discussion of infection control including proper hand washing and proper equipment cleaning during exercise session.   Cardiac Rehab from 08/05/2020 in [facility] Cardiac and Pulmonary Rehab  Date 05/07/20  Educator [facility]  Instruction Review Code 1- Verbalizes Understanding      Falls Prevention: - Provides verbal and written material to individual with discussion of falls prevention and safety.   Cardiac Rehab from 08/05/2020 in [facility] Cardiac and Pulmonary Rehab  Date 05/07/20  Educator [facility]  Instruction Review Code 1- Verbalizes Understanding      Other: -Provides group and verbal instruction on various topics (see comments)   Knowledge Questionnaire Score:  Knowledge Questionnaire Score - 05/11/20 0927      Knowledge Questionnaire Score   Pre Score 21/26 Education focus: Angina, Sex, Heart Failure, PAD, Nutrition, Exercise           Core Components/Risk Factors/Patient Goals at Admission:  Personal Goals and Risk Factors at Admission - 05/07/20 1115      Core Components/Risk Factors/Patient Goals on Admission    Weight Management Yes;Weight Maintenance    Intervention Weight Management: Develop a combined nutrition and exercise program designed to reach desired caloric  intake, while maintaining appropriate intake of nutrient and fiber,  sodium and fats, and appropriate energy expenditure required for the weight goal.;Weight Management: Provide education and appropriate resources to help participant work on and attain dietary goals.    Admit Weight 156 lb 14.4 oz (71.2 kg)    Goal Weight: Short Term 156 lb (70.8 kg)    Goal Weight: Long Term 156 lb (70.8 kg)    Expected Outcomes Short Term: Continue to assess and modify interventions until short term weight is achieved;Long Term: Adherence to nutrition and physical activity/exercise program aimed toward attainment of established weight goal;Weight Maintenance: Understanding of the daily nutrition guidelines, which includes 25-35% calories from fat, 7% or less cal from saturated fats, less than 285m cholesterol, less than 1.5gm of sodium, & 5 or more servings of fruits and vegetables daily    Diabetes Yes    Intervention Provide education about signs/symptoms and action to take for hypo/hyperglycemia.;Provide education about proper nutrition, including hydration, and aerobic/resistive exercise prescription along with prescribed medications to achieve blood glucose in normal ranges: Fasting glucose 65-99 mg/dL    Expected Outcomes Short Term: Participant verbalizes understanding of the signs/symptoms and immediate care of hyper/hypoglycemia, proper foot care and importance of medication, aerobic/resistive exercise and nutrition plan for blood glucose control.;Long Term: Attainment of HbA1C < 7%.    Hypertension Yes    Intervention Provide education on lifestyle modifcations including regular physical activity/exercise, weight management, moderate sodium restriction and increased consumption of fresh fruit, vegetables, and low fat dairy, alcohol moderation, and smoking cessation.;Monitor prescription use compliance.    Expected Outcomes Short Term: Continued assessment and intervention until BP is < 140/933mHG in  hypertensive participants. < 130/8022mG in hypertensive participants with diabetes, heart failure or chronic kidney disease.;Long Term: Maintenance of blood pressure at goal levels.    Lipids Yes    Intervention Provide education and support for participant on nutrition & aerobic/resistive exercise along with prescribed medications to achieve LDL <54m80mDL >40mg57m Expected Outcomes Short Term: Participant states understanding of desired cholesterol values and is compliant with medications prescribed. Participant is following exercise prescription and nutrition guidelines.;Long Term: Cholesterol controlled with medications as prescribed, with individualized exercise RX and with personalized nutrition plan. Value goals: LDL < 54mg,55m > 40 mg.           Education:Diabetes - Individual verbal and written instruction to review signs/symptoms of diabetes, desired ranges of glucose level fasting, after meals and with exercise. Acknowledge that pre and post exercise glucose checks will be done for 3 sessions at entry of program.   [facility]  Date 04/24/20  Educator [facility]  [facility] Review Code 1- [employer]bal[employer] States [employer] Corporationstanding      Education: Know Your Numbers and Risk Factors: -Group verbal and written instruction about important numbers in your health.  Discussion of what are risk factors and how they play a role in the disease process.  Review of Cholesterol, Blood Pressure, Diabetes, and BMI and the role they play in your overall health.   [facility]  Date 07/15/20  Educator JH  [facility] Code 1- Verbalizes Understanding      Core Components/Risk Factors/Patient Goals Review:   Goals and Risk Factor Review    Row Name 06/03/20 0808 [id number] 07/29/20 0807         Core Components/Risk Factors/Patient Goals Review   Personal Goals Review Weight  Management/Obesity;Lipids;Hypertension;Diabetes Weight Management/Obesity;Lipids;Hypertension;Diabetes Weight Management/Obesity;Lipids;Hypertension;Diabetes  Review Patient has been doing well in the program and has no questions about his medications. He has not been monitoring his blood pressure at home. He checks his sugar at home and has been within his normal range. Informed him why it is important to check his blood pressure at home. [name] checks BG most days at least 3 times.  He states it is steady. He is checking BP at home some days.  He is doing well with the program overall. [name] is compliant with taking hus sugars at home- usually about twice per day, but more if he feels symptomatic. BPs remain stable at home, sometimes elevated, he also continues to check that. Weight is checked everyday which has been consistent. Patient is graduating next week and was stressed the importance of maintaining regular checks to help manage lifestyle factors.     Expected Outcomes Short: check blood pressure at home. Long: maintain blood pressure checks at home independently. Short: continue to monitor BP and BG Long: manage risk factors Short: Graduate Long: Continue to manage risk factors at home, stay compliant with checking numbers            Core Components/Risk Factors/Patient Goals at Discharge (Final Review):   Goals and Risk Factor Review - 07/29/20 0807      Core Components/Risk Factors/Patient Goals Review   Personal Goals Review Weight Management/Obesity;Lipids;Hypertension;Diabetes    Review [name] is compliant with taking hus sugars at home- usually about twice per day, but more if he feels symptomatic. BPs remain stable at home, sometimes elevated, he also continues to check that. Weight is checked everyday which has been consistent. Patient is graduating next week and was stressed the importance of maintaining regular checks to help manage lifestyle factors.    Expected Outcomes Short:  Graduate Long: Continue to manage risk factors at home, stay compliant with checking numbers           ITP Comments:  ITP Comments    Row Name 04/24/20 1416 05/07/20 1053 05/11/20 0957 05/20/20 0620 06/17/20 1038   ITP Comments Initial telephone orientation completed. Diagnosis can be found in [facility] 4/12. EP orientation scheduled for 5/27 at 11am Completed 6MWT and gym orientation.  Initial ITP created and sent for review to Dr. Emily Filbert, Medical Director. First full day of exercise!  Patient was oriented to gym and equipment including functions, settings, policies, and procedures.  Patient's individual exercise prescription and treatment plan were reviewed.  All starting workloads were established based on the results of the 6 minute walk test done at initial orientation visit.  The plan for exercise progression was also introduced and progression will be customized based on patient's performance and goals. 30 Day review completed. Medical Director ITP review done, changes made as directed, and signed approval by Medical Director. 30 Day review completed. Medical Director ITP review done, changes made as directed, and signed approval by Medical Director.   [place] Name 07/15/20 0628 08/05/20 0932         ITP Comments 30 Day review completed. Medical Director ITP review done, changes made as directed, and signed approval by Medical Director. Tyler "[name]" graduated today from  rehab with 36 sessions completed.  Details of the patient's exercise prescription and what He needs to do in order to continue the prescription and progress were discussed with patient.  Patient was given a copy of prescription and goals.  Patient verbalized understanding.  Gaetano plans to continue to exercise by working at home and walking.  Comments: Discharge ITP

## 2020-08-05 NOTE — Progress Notes (Signed)
Discharge Progress Report  Patient Details  Name: [name] MRN: [id number] Date of Birth: 11-Feb-1946 Referring Provider:     Cardiac Rehab from 05/07/2020 in [facility] Cardiac and Pulmonary Rehab  Referring Provider [place], Peter MD       Number of Visits: 36  Reason for Discharge:  Patient reached a stable level of exercise. Patient independent in their exercise.  Smoking History:  Social History   Tobacco Use  Smoking Status Former Smoker  . Packs/day: 1.00  . Years: 22.00  . Pack years: 22.00  . Types: Cigarettes, Pipe  . Quit date: 12/17/2014  . Years since quitting: 5.6  Smokeless Tobacco Never Used    Diagnosis:  ST elevation myocardial infarction (STEMI), unspecified artery (HCC)  Status post coronary artery stent placement  ADL UCSD:   Initial Exercise Prescription:  Initial Exercise Prescription - 05/07/20 1100      Date of Initial Exercise RX and Referring Provider   Date 05/07/20    Referring Provider [place], Peter MD      Treadmill   MPH 2.3    Grade 0.5    Minutes 15    METs 2.92      Recumbant Bike   Level 2    RPM 50    Watts 19    Minutes 15    METs 2      NuStep   Level 1    SPM 80    Minutes 15    METs 2      Arm Ergometer   Level 2    Watts 30    RPM 25    Minutes 15    METs 2      REL-XR   Level 1    Speed 50    Minutes 15    METs 2      T5 Nustep   Level 1    SPM 80    Minutes 15    METs 2      Prescription Details   Frequency (times per week) 2    Duration Progress to 30 minutes of continuous aerobic without signs/symptoms of physical distress      Intensity   THRR 40-80% of Max Heartrate 93-128    Ratings of Perceived Exertion 11-13    Perceived Dyspnea 0-4      Progression   Progression Continue to progress workloads to maintain intensity without signs/symptoms of physical distress.      Resistance Training   Training Prescription Yes    Weight 3 lb    Reps 10-15           Discharge  Exercise Prescription (Final Exercise Prescription Changes):  Exercise Prescription Changes - 07/29/20 0900      Home Exercise Plan   Plans to continue exercise at Home (comment)   walking   Frequency Add 2 additional days to program exercise sessions.    Initial Home Exercises Provided 07/29/20           Functional Capacity:  6 Minute Walk    Row Name 05/07/20 1053 07/29/20 0922       6 Minute Walk   Phase Initial Discharge    Distance 1230 feet 1282 feet    Distance % Change -- 4.22 %    Distance Feet Change -- 52 ft    Walk Time 6 minutes 6 minutes    # of Rest Breaks 0 0    MPH 2.33 2.42    METS 2.83 3.15  RPE 7 9    Perceived Dyspnea  -- 0    VO2 Peak 9.91 11.05    Symptoms No No    Resting HR 58 bpm 58 bpm    Resting BP 114/72 136/64    Resting Oxygen Saturation  97 % --    Exercise Oxygen Saturation  during 6 min walk 97 % --    Max Ex. HR 84 bpm 101 bpm    Max Ex. BP 156/66 160/64    2 Minute Post BP 104/58 146/70               Nutrition:  Nutrition Therapy & Goals - 05/20/20 0825      Nutrition Therapy   Diet heart healthy, low Na    Protein (specify units) 55-60g    Fiber 30 grams    Whole Grain Foods 3 servings    Saturated Fats 12 max. grams    Fruits and Vegetables 5 servings/day    Sodium 1.5 grams      Personal Nutrition Goals   Nutrition Goal ST: none at this time as wife does the cooking and his memory is poor LT: get healthier and be more active (yard work, work around [employer]), lower LDL, increase HDL    Comments Whatever his wife cooks. 3  light meals. cereal (cheerios and raisin bran- whole milk) in morning and sometimes bacon and eggs. L: sandwich (peanut butter and  banana on whole wheat)  or cooked meal (beans, peas, and cornbread, cabbage) D: chicken but will mix it up. Wife is a Librarian, academic at the cancer center. Memory has been worse since heart attack. Discussed heart healthy eating. Pt takes BG 3x/day, but does not remember  what the numbers usually are.      Intervention Plan   Intervention Prescribe, educate and counsel regarding individualized specific dietary modifications aiming towards targeted core components such as weight, hypertension, lipid management, diabetes, heart failure and other comorbidities.;Nutrition handout(s) given to patient.    Expected Outcomes Short Term Goal: Understand basic principles of dietary content, such as calories, fat, sodium, cholesterol and nutrients.;Short Term Goal: A plan has been developed with personal nutrition goals set during dietitian appointment.;Long Term Goal: Adherence to prescribed nutrition plan.           Goals reviewed with patient; copy given to patient.

## 2020-08-12 ENCOUNTER — Other Ambulatory Visit: Payer: Self-pay | Admitting: Physician Assistant

## 2020-08-12 ENCOUNTER — Other Ambulatory Visit: Payer: Self-pay | Admitting: Cardiology

## 2020-11-06 ENCOUNTER — Other Ambulatory Visit: Payer: Self-pay | Admitting: Cardiology

## 2020-12-03 ENCOUNTER — Telehealth: Payer: Self-pay | Admitting: Cardiology

## 2020-12-03 NOTE — Progress Notes (Signed)
Cardiology Office Note   Date:  12/08/2020   ID:  BRAVLIO MURAD, DOB November 09, 1946, MRN [id number]  PCP:  Marton Redwood, MD  Cardiologist:  Annikah Lovins [place], MD EP: None  Chief Complaint  Patient presents with  . Coronary Artery Disease      History of Present Illness: [name] is a 74 y.o. male who is seen for evaluation of chest pain. He has a  PMH of CAD s/p CABG 12/2019. Then presented with STEMI 03/16/20 with PCI/DES of OM2 x2 and PCI/DES of dLCx- SVG to OM and PDA occluded. He had post-op atrial fibrillation, HTN, HLD, and DM type 2.  In January 2021 he presented with chest pain and had an abnormal stress test. Subsequent cardiac cath showed severe 3 vessel CAD. He underwent CABG by Dr Prescott Gum on 12/20/19 including LIMA to LAD, SVG to diagonal, SVG to OM1 and SVG to PDA. Post op course with uneventful.   He was admitted to [facility] from 03/16/20-03/17/20 after presenting with chest pain where he was found to have a STEMI. He was taken emergently to the cath lab which revealed occluded SVG to OM2 and RCA. Patent LIMA to LAD and SVG to diagonal. OM 2 has two separate high grade stenosis and he underwent successful PCI of OM2 with DES x 2 in proximal and distal vessel. Also underwent successful PCI of the distal LCx (90% stenosis) with DES x 1. He did have a 70% proximal RCA proximally and a diffusely diseased PLA branch which fills by left-to-right collaterals medically managed. He was recommended for DAPT with aspirin and plavix. He was recommended to follow-up with the lipid clinic for PSK9-I consideration given prior intolerance to statins and LDL 137 03/16/20. His anticoagulation and amiodarone were discontinued given Afib history occurred following CABG without reoccurrence.   He was seen in our lipid clinic in May and started on Praluent. When last seen in August noted a focal area of discomfort in the left parasternal area. Completed Cardiac Rehab. Noted memory loss has been  worse since bypass surgery but even worse in the last 2 weeks.    More recently he has complained of some chest pain. He notes 4 episodes of chest pain. Not associated with activity. Hits him hard in his anterior chest to the point he feels like he might pass out. No dyspnea or diaphoresis. Symptoms last a few seconds and all have lasted less than one minute so he hasn't taken Ntg. States when he has this pain his thinking gets foggy and he can't think straight.     Past Medical History:  Diagnosis Date  . Arthritis   . Arthritis   . Atrial fibrillation ([place])   . BPH (benign prostatic hypertrophy)   . CAD (coronary artery disease)    stents placed  . Diabetes mellitus   . Diverticulosis   . Dyspnea    when exerting self  . Dysrhythmia    "extra beats"  . Esophageal stricture   . GERD (gastroesophageal reflux disease)   . Headache(784.0)   . Hemorrhoids   . Hernia, inguinal, right   . Hiatal hernia   . Hyperlipidemia   . Hypertension   . Pneumonia   . Staph infection    Left ankle  . 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 Urinary hesitancy     Past Surgical History:  Procedure Laterality Date  . ANKLE ARTHROPLASTY  2006  . CARDIOVASCULAR STRESS TEST  01/22/2009  EF 51%, NO ISCHEMIA  . CARDIOVASCULAR STRESS TEST    . COLONOSCOPY    . CORONARY ANGIOPLASTY WITH STENT PLACEMENT  03/2005  . CORONARY ARTERY BYPASS GRAFT N/A 12/20/2019   Procedure: CORONARY ARTERY BYPASS GRAFTING (CABG) times four, using left internal mammary artery and left greater saphenous vein;  Surgeon: Ivin Poot, MD;  Location: [place];  Service: Open Heart Surgery;  Laterality: N/A;  . CORONARY STENT INTERVENTION N/A 03/16/2020   Procedure: CORONARY STENT INTERVENTION;  Surgeon: Martinique, Astha Probasco M, MD;  Location: [place] [facility];  Service: Cardiovascular;  Laterality: N/A;  . CORONARY STENT PLACEMENT  03/10/2005   SUCCESSFUL STENTING OF THE MID AND CRUX OF THE RCA  . CORONARY/GRAFT ACUTE MI  REVASCULARIZATION N/A 03/16/2020   Procedure: Coronary/Graft Acute MI Revascularization;  Surgeon: Martinique, Delvin Hedeen M, MD;  Location: [place] [facility];  Service: Cardiovascular;  Laterality: N/A;  . ESOPHAGEAL DILATION     multiple procedures  . HERNIA REPAIR  [id number]   umbilical hernia  . INGUINAL HERNIA REPAIR  11/28/2011   Procedure: HERNIA REPAIR INGUINAL ADULT;  Surgeon: Imogene Burn. Georgette Dover, MD;  Location: [place] [facility];  Service: General;  Laterality: Right;  right inguinal hernia repair with mesh  . KNEE SURGERY     Right  . LEFT HEART CATH AND CORONARY ANGIOGRAPHY N/A 12/03/2019   Procedure: LEFT HEART CATH AND CORONARY ANGIOGRAPHY;  Surgeon: Jettie Booze, MD;  Location: [place] [facility];  Service: Cardiovascular;  Laterality: N/A;  . LEFT HEART CATH AND CORS/GRAFTS ANGIOGRAPHY N/A 03/16/2020   Procedure: LEFT HEART CATH AND CORS/GRAFTS ANGIOGRAPHY;  Surgeon: Martinique, Raun Routh M, MD;  Location: [place] [facility];  Service: Cardiovascular;  Laterality: N/A;  . POLYPECTOMY    . TEE WITHOUT CARDIOVERSION N/A 12/20/2019   Procedure: TRANSESOPHAGEAL ECHOCARDIOGRAM (TEE);  Surgeon: Prescott Gum, Collier Salina, MD;  Location: [place];  Service: Open Heart Surgery;  Laterality: N/A;  . TONSILLECTOMY  1958  . UMBILICAL HERNIA REPAIR  11/2006  . US ECHOCARDIOGRAPHY       Current Outpatient Medications  Medication Sig Dispense Refill  . acetaminophen (TYLENOL) 650 MG CR tablet Take 1,300 mg by mouth every 8 (eight) hours as needed for pain.    . Alirocumab (PRALUENT) 150 MG/ML SOAJ Inject 150 mg into the skin every 14 (fourteen) days. 2 pen 11  . aspirin EC 81 MG tablet Take 1 tablet (81 mg total) by mouth daily.    . B Complex-C (B-COMPLEX WITH VITAMIN C) tablet Take 1 tablet by mouth daily with breakfast.     . cetirizine (ZYRTEC) 10 MG tablet Take 10 mg by mouth daily.    . clopidogrel (PLAVIX) 75 MG tablet Take 1 tablet (75 mg total) by mouth daily with breakfast. 30 tablet 11  . empagliflozin  (JARDIANCE) 25 MG TABS tablet Take 1 tablet (25 mg total) by mouth daily before breakfast. 30 tablet   . fluticasone (FLONASE) 50 MCG/ACT nasal spray Place 2 sprays into both nostrils daily as needed for allergies.    . isosorbide mononitrate (IMDUR) 30 MG 24 hr tablet TAKE 0.5 TABLET BY MOUTH DAILY. 16 tablet 6  . lansoprazole (PREVACID) 15 MG capsule Take 15 mg by mouth daily.    Marland Kitchen lisinopril (ZESTRIL) 20 MG tablet TAKE 1 TABLET BY MOUTH DAILY 30 tablet 11  . Multiple Minerals-Vitamins (CALCIUM-MAGNESIUM-ZINC-D3) TABS Take 1 tablet by mouth daily with lunch.     . nitroGLYCERIN (NITROSTAT) 0.4 MG SL tablet Place 1 tablet (  0.4 mg total) under the tongue every 5 (five) minutes as needed. 25 tablet 12  . OneTouch Delica Lancets 99991111 MISC 1 each by Other route daily.    Glory Rosebush VERIO test strip 1 each by Other route daily.    . Tamsulosin HCl (FLOMAX) 0.4 MG CAPS Take 0.4 mg by mouth daily after supper.     No current facility-administered medications for this visit.    Allergies:   Statins, Bismuth subsalicylate, Codeine, Kiwi extract, Morphine and related, and Altace [ramipril]    Social History:  The patient  reports that he quit smoking about 5 years ago. His smoking use included cigarettes and pipe. He has a 22.00 pack-year smoking history. He has never used smokeless tobacco. He reports that he does not drink alcohol and does not use drugs.   Family History:  The patient's family history includes Cancer in his paternal grandmother; Heart disease in his brother, maternal grandfather, mother, and sister; Ovarian cancer in his paternal grandmother.    ROS:  Please see the history of present illness.   Otherwise, review of systems are positive for none.   All other systems are reviewed and negative.    PHYSICAL EXAM: VS:  BP (!) 148/76   Pulse (!) 49   Ht 5\' 7"  (1.702 m)   Wt 157 lb 8 oz (71.4 kg)   SpO2 96%   BMI 24.67 kg/m  , BMI Body mass index is 24.67 kg/m. GEN: Well  nourished, well developed, in no acute distress HEENT: normal Neck: no JVD, carotid bruits, or masses Cardiac: RRR; no murmurs, rubs, or gallops, no edema; +point tenderness to palpation left upper sternal margin Respiratory:  clear to auscultation bilaterally, normal work of breathing GI: soft, nontender, nondistended, + BS MS: no deformity or atrophy Skin: warm and dry, no rash Neuro:  Strength and sensation are intact Psych: euthymic mood, full affect   EKG:  EKG is  ordered today. Marked sinus brady. Rate 46. LAD, LVH, old inferior infarct. No acute change. I have personally reviewed and interpreted this study.     Recent Labs: 12/21/2019: Magnesium 2.3 03/17/2020: BUN 16; Creatinine, Ser 1.30; Hemoglobin 11.9; Platelets 179; Potassium 3.9; Sodium 138 06/25/2020: ALT 11    Lipid Panel    Component Value Date/Time   CHOL 101 06/25/2020 0925   TRIG 70 06/25/2020 0925   HDL 48 06/25/2020 0925   CHOLHDL 2.1 06/25/2020 0925   CHOLHDL 4.4 03/16/2020 0646   VLDL 21 03/16/2020 0646   LDLCALC 38 06/25/2020 0925    dated 04/14/20: A1c 7.2%.   Wt Readings from Last 3 Encounters:  12/08/20 157 lb 8 oz (71.4 kg)  07/20/20 156 lb 6.4 oz (70.9 kg)  06/17/20 155 lb (70.3 kg)      Other studies Reviewed: Additional studies/ records that were reviewed today include:   Left heart catheterization 03/16/20:  Mid LAD-2 lesion is 100% stenosed.  Mid LAD-1 lesion is 80% stenosed.  Prox LAD lesion is 25% stenosed.  Dist Cx lesion is 90% stenosed.  A drug-eluting stent was successfully placed using a Paul [id number].  Post intervention, there is a 0% residual stenosis.  Prox RCA lesion is 65% stenosed.  RPAV lesion is 95% stenosed.  Mid RCA lesion is 30% stenosed.  Dist RCA lesion is 30% stenosed.  2nd Mrg-1 lesion is 99% stenosed.  A drug-eluting stent was successfully placed using a STENT RESOLUTE ONYX [id number].  Post intervention, there is a 0% residual  stenosis.  2nd Mrg-2 lesion is 90% stenosed.  A drug-eluting stent was successfully placed using a [place] [id number].  Post intervention, there is a 0% residual stenosis.  LIMA graft was visualized by angiography and is normal in caliber.  The graft exhibits no disease.  SVG graft was visualized by angiography and is normal in caliber.  The graft exhibits no disease.  SVG graft was visualized by angiography.  SVG graft was visualized by angiography.  Origin lesion is 100% stenosed.  Origin to Prox Graft lesion is 100% stenosed.  LV end diastolic pressure is normal.  1. Severe 3 vessel obstructive CAD 2. Patent LIMA to the LAD 3. Patent SVG to the diagonal 4. Occluded SVG to OM2. The graft appeared to insert into a segment of severe disease 5. Occluded SVG to RCA possibly due to competitive filling from the native vessel 6. Successful PCI of OM2 with DES x 2 in proximal and distal vessel 7. Successful PCI of the distal LCx with DES x 1.  8. Normal LVEDP  Plan: DAPT for at least one year. Will DC Eliquis since he has had no Afib since DC from surgery. DC amiodarone. Intolerant to multiple cholesterol lowering drugs in the past. Will see lipid clinic as outpatient to get started on PCSK 9 inhibitor. I doubt the lesion in the proximal RCA is flow limiting so we will treat this medically for now. If he has recurrent angina we could consider PCI of this vessel. Patient may be a candidate for DC tomorrow if clinically stable.   Echocardiogram 12/2019: 1. Left ventricular ejection fraction, by visual estimation, is 55 to  60%. The left ventricle has normal function. There is no left ventricular  hypertrophy.  2. Left ventricular diastolic parameters are indeterminate.  3. The left ventricle has no regional wall motion abnormalities.  4. Global right ventricle has normal systolic function.The right  ventricular size is normal. No increase in right ventricular wall   thickness.  5. Left atrial size was mildly dilated.  6. Right atrial size was mildly dilated.  7. The mitral valve is normal in structure. No evidence of mitral valve  regurgitation. No evidence of mitral stenosis.  8. The tricuspid valve is normal in structure.  9. The aortic valve is normal in structure. Aortic valve regurgitation is  not visualized. No evidence of aortic valve sclerosis or stenosis.  10. The pulmonic valve was normal in structure. Pulmonic valve  regurgitation is not visualized.  11. The atrial septum is grossly normal.   ASSESSMENT AND PLAN:   1. CAD s/p CABG 12/2019 with subsequent PCI/DES to OM2 x2 and LCx x1 03/16/20 with cardiac cath showing occlusion of SVG to OM1 and SVG to PDA:  - he is having some chest pain that is brief in duration but severe. Not associated with activity. Not sure if this is ischemic or musculoskeletal in origin. Ecg is unchanged.  - Continue aspirin and plavix for one year from stents.  - given marked bradycardia will reduce metoprolol dose to 25 mg bid - increase Imdur to 30 mg daily - lifestyle modification - ultimately if chest pain becomes more frequent, severe, or sustained we will need to consider for repeat cardiac cath.   2. HTN: BP is well controlled - Coninue metoprolol and lisinopril  3. HLD: LDL 137 03/16/20. He is intolerant to statins - now on Praluent with excellent response. LDL 38.  4. DM type 2: A1C 7.2 - Continue metformin and Jardiance -  follow up with Dr Clelia Croft. History of hypoglycemia on glipizide.   5. Post-op atrial fibrillation: occurred in the post CABG setting and no evidence of recurrence.  - Continue to monitor for recurrence.   6. Cognitive impairment. Wife notes he had this prior to surgery but it is worse since surgery and she feels it is even worse in the last 2 weeks. I have recommended referral to Neuro.   Current medicines are reviewed at length with the patient today.  The patient does not  have concerns regarding medicines.  The following changes have been made:  As above  Labs/ tests ordered today include:   No orders of the defined types were placed in this encounter.    Disposition:   FU with Dr. [place] in March  Signed, Alliah Boulanger Sw[place], MD  12/08/2020 4:55 PM

## 2020-12-03 NOTE — Telephone Encounter (Signed)
Spoke with pt, he reports yesterday while straightening up a closet in the garage he developed a sudden, sharpe  pain in his chest. He said it felt like someone jumped on his chest and sat down. He stopped what he was doing and after 4-5 secs it went away. He went back to cleaning the closet and had no other problems. He reports no other symptoms of SOB or radiation of the pain and he feels fine today. His wife reports that for the last couple weeks he has told her he has had chest pain and for the last couple days he has not felt good. He also told her this morning he had chest pain during the night. The patient can not remember any details from that pain. The wife also reports she sees things that are similar to what he had earlier this year, she feels he needs to be seen. Encouraged the wife to take the patient to the ER for evaluation. She would like the message sent to dr [place] to see if the patient can be seen next week. Explained there are no APP or MD appointments next week. Discussed with dr Bjorn Pippin DOD, explained to wife that if the patient has chest pain lasting longer than 5 min to use NTG. Will forward this note to dr [place] to review.

## 2020-12-03 NOTE — Telephone Encounter (Signed)
I can see him on Jan 4 at 4:20 - looks like I have an opening then  Theron Arista

## 2020-12-03 NOTE — Telephone Encounter (Signed)
Pt c/o of Chest Pain: STAT if CP now or developed within 24 hours  1. Are you having CP right now?  Not now, but last night  2. Are you experiencing any other symptoms (ex. SOB, nausea, vomiting, sweating)?  no 3. How long have you been experiencing CP? 3 episodes in the last week  4. Is your CP continuous or coming and going? Comes and goes  5. Have you taken Nitroglycerin? no ?

## 2020-12-03 NOTE — Telephone Encounter (Signed)
Spoke with pt wife, Follow up scheduled  

## 2020-12-08 ENCOUNTER — Encounter: Payer: Self-pay | Admitting: Cardiology

## 2020-12-08 ENCOUNTER — Other Ambulatory Visit: Payer: Self-pay

## 2020-12-08 ENCOUNTER — Ambulatory Visit (INDEPENDENT_AMBULATORY_CARE_PROVIDER_SITE_OTHER): Payer: Self-pay | Admitting: Cardiology

## 2020-12-08 VITALS — BP 148/76 | HR 49 | Ht 67.0 in | Wt 157.5 lb

## 2020-12-08 DIAGNOSIS — I2510 Atherosclerotic heart disease of native coronary artery without angina pectoris: Secondary | ICD-10-CM

## 2020-12-08 DIAGNOSIS — I10 Essential (primary) hypertension: Secondary | ICD-10-CM

## 2020-12-08 DIAGNOSIS — R4189 Other symptoms and signs involving cognitive functions and awareness: Secondary | ICD-10-CM

## 2020-12-08 DIAGNOSIS — E7800 Pure hypercholesterolemia, unspecified: Secondary | ICD-10-CM

## 2020-12-08 DIAGNOSIS — Z951 Presence of aortocoronary bypass graft: Secondary | ICD-10-CM

## 2020-12-08 MED ORDER — ISOSORBIDE MONONITRATE ER 30 MG PO TB24: 30.0000 mg | ORAL_TABLET | Freq: Every day | ORAL | 3 refills | Status: DC

## 2020-12-08 MED ORDER — NITROGLYCERIN 0.4 MG SL SUBL: 0.4000 mg | SUBLINGUAL_TABLET | SUBLINGUAL | 11 refills | Status: DC | PRN

## 2020-12-08 MED ORDER — METOPROLOL TARTRATE 25 MG PO TABS: 25.0000 mg | ORAL_TABLET | Freq: Two times a day (BID) | ORAL | 3 refills | Status: DC

## 2020-12-08 NOTE — Patient Instructions (Signed)
Reduce metoprolol to 25 mg twice a day  Increase Imdur (isosorbide) to 30 mg daily  We will refer you to Neurology  Keep follow up in 2 months.  Let me know if chest pain gets more frequent or severe.

## 2020-12-08 NOTE — Addendum Note (Signed)
Addended by: Neoma Laming on: 12/08/2020 05:09 PM   Modules accepted: Orders

## 2020-12-14 ENCOUNTER — Encounter: Payer: Self-pay | Admitting: Neurology

## 2021-02-01 ENCOUNTER — Other Ambulatory Visit: Payer: Self-pay | Admitting: Cardiology

## 2021-02-01 ENCOUNTER — Encounter: Payer: Self-pay | Admitting: Cardiology

## 2021-02-01 ENCOUNTER — Other Ambulatory Visit: Payer: Self-pay

## 2021-02-01 ENCOUNTER — Telehealth: Payer: Self-pay | Admitting: Cardiology

## 2021-02-01 ENCOUNTER — Ambulatory Visit: Payer: BC Managed Care – PPO | Admitting: Cardiology

## 2021-02-01 VITALS — BP 154/76 | HR 68 | Ht 68.5 in | Wt 162.0 lb

## 2021-02-01 DIAGNOSIS — I10 Essential (primary) hypertension: Secondary | ICD-10-CM | POA: Diagnosis not present

## 2021-02-01 DIAGNOSIS — R4189 Other symptoms and signs involving cognitive functions and awareness: Secondary | ICD-10-CM

## 2021-02-01 DIAGNOSIS — I200 Unstable angina: Secondary | ICD-10-CM

## 2021-02-01 DIAGNOSIS — Z951 Presence of aortocoronary bypass graft: Secondary | ICD-10-CM | POA: Diagnosis not present

## 2021-02-01 DIAGNOSIS — I25700 Atherosclerosis of coronary artery bypass graft(s), unspecified, with unstable angina pectoris: Secondary | ICD-10-CM | POA: Diagnosis not present

## 2021-02-01 DIAGNOSIS — E119 Type 2 diabetes mellitus without complications: Secondary | ICD-10-CM

## 2021-02-01 DIAGNOSIS — E7800 Pure hypercholesterolemia, unspecified: Secondary | ICD-10-CM

## 2021-02-01 MED ORDER — SODIUM CHLORIDE 0.9% FLUSH: 3.0000 mL | Freq: Two times a day (BID) | INTRAVENOUS | Status: DC

## 2021-02-01 MED ORDER — ISOSORBIDE MONONITRATE ER 30 MG PO TB24: 60.0000 mg | ORAL_TABLET | Freq: Every day | ORAL | 3 refills | Status: DC

## 2021-02-01 NOTE — Telephone Encounter (Signed)
Pt c/o of Chest Pain: STAT if CP now or developed within 24 hours  1. Are you having CP right now? No   2. Are you experiencing any other symptoms (ex. SOB, nausea, vomiting, sweating)? Patient's wife states the patient was SOB yesterday with exertion  3. How long have you been experiencing CP? Since yesterday morning, per patient's wife  4. Is your CP continuous or coming and going? Coming and going   5. Have you taken Nitroglycerin? Patient's wife states the patient took 1 dose this morning.   Pt c/o Shortness Of Breath: STAT if SOB developed within the last 24 hours or pt is noticeably SOB on the phone  1. Are you currently SOB (can you hear that pt is SOB on the phone)? No   2. How long have you been experiencing SOB? Patient's wife states yesterday the patient was SOB with exertion, but he is better today   3. Are you SOB when sitting or when up moving around? When up and moving around  4. Are you currently experiencing any other symptoms? No   ?

## 2021-02-01 NOTE — Progress Notes (Signed)
Cardiology Office Note   Date:  02/01/2021   ID:  [name], DOB Dec 03, 1946, MRN [id number]  PCP:  Marton Redwood, MD  Cardiologist:  Peter Martinique, MD EP: None  Chief Complaint  Patient presents with  . Chest Pain      History of Present Illness: [name] is a 75 y.o. male who is seen for evaluation of chest pain. He has a  PMH of CAD s/p CABG 12/2019. Then presented with STEMI 03/16/20 with PCI/DES of OM2 x2 and PCI/DES of dLCx- SVG to OM and PDA occluded. He had post-op atrial fibrillation, HTN, HLD, and DM type 2.  In January 2021 he presented with chest pain and had an abnormal stress test. Subsequent cardiac cath showed severe 3 vessel CAD. He underwent CABG by Dr Prescott Gum on 12/20/19 including LIMA to LAD, SVG to diagonal, SVG to OM1 and SVG to PDA. Post op course with uneventful.   He was admitted to [facility] from 03/16/20-03/17/20 after presenting with chest pain where he was found to have a STEMI. He was taken emergently to the cath lab which revealed occluded SVG to OM2 and RCA. Patent LIMA to LAD and SVG to diagonal. OM 2 has two separate high grade stenosis and he underwent successful PCI of OM2 with DES x 2 in proximal and distal vessel. Also underwent successful PCI of the distal LCx (90% stenosis) with DES x 1. He did have a 70% proximal RCA proximally and a diffusely diseased PLA branch which fills by left-to-right collaterals medically managed. He was recommended for DAPT with aspirin and plavix. He was recommended to follow-up with the lipid clinic for PSK9-I consideration given prior intolerance to statins and LDL 137 03/16/20. His anticoagulation and amiodarone were discontinued given Afib history occurred following CABG without reoccurrence.   He was seen in our lipid clinic in May and started on Praluent. When  seen in August noted a focal area of discomfort in the left parasternal area. Completed Cardiac Rehab. Noted memory loss has been worse since bypass  surgery.  Was seen on Jan 4 with  some chest pain. Not associated with activity. Hits him hard in his anterior chest to the point he feels like he might pass out. No dyspnea or diaphoresis. Symptoms last a few seconds and all have lasted less than one minute so he hasn't taken Ntg. We reduced his beta blocker due to bradycardia. Imdur was increased to 30 mg daily.   On follow up today he is seen with his wife and he reports that for the last 2 weeks he has more chest pain. He relates it to being in the cold. Doesn't last long. Did take 2 sl Ntg today. Pain is mid sternal radiating to the neck, left arm, and back. Notes he is under stress since his dog is ill. He is scheduled to see Neuro on March 25 for cognitive impairment.     Past Medical History:  Diagnosis Date  . Arthritis   . Arthritis   . Atrial fibrillation ([place])   . BPH (benign prostatic hypertrophy)   . CAD (coronary artery disease)    stents placed  . Diabetes mellitus   . Diverticulosis   . Dyspnea    when exerting self  . Dysrhythmia    "extra beats"  . Esophageal stricture   . GERD (gastroesophageal reflux disease)   . Headache(784.0)   . Hemorrhoids   . Hernia, inguinal, right   . Hiatal  hernia   . Hyperlipidemia   . Hypertension   . Pneumonia   . Staph infection    Left ankle  . 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 Urinary hesitancy     Past Surgical History:  Procedure Laterality Date  . ANKLE ARTHROPLASTY  2006  . CARDIOVASCULAR STRESS TEST  01/22/2009   EF 51%, NO ISCHEMIA  . CARDIOVASCULAR STRESS TEST    . COLONOSCOPY    . CORONARY ANGIOPLASTY WITH STENT PLACEMENT  03/2005  . CORONARY ARTERY BYPASS GRAFT N/A 12/20/2019   Procedure: CORONARY ARTERY BYPASS GRAFTING (CABG) times four, using left internal mammary artery and left greater saphenous vein;  Surgeon: Ivin Poot, MD;  Location: [place];  Service: Open Heart Surgery;  Laterality: N/A;  . CORONARY STENT INTERVENTION N/A 03/16/2020    Procedure: CORONARY STENT INTERVENTION;  Surgeon: Martinique, Peter M, MD;  Location: [place] [facility];  Service: Cardiovascular;  Laterality: N/A;  . CORONARY STENT PLACEMENT  03/10/2005   SUCCESSFUL STENTING OF THE MID AND CRUX OF THE RCA  . CORONARY/GRAFT ACUTE MI REVASCULARIZATION N/A 03/16/2020   Procedure: Coronary/Graft Acute MI Revascularization;  Surgeon: Martinique, Peter M, MD;  Location: [place] [facility];  Service: Cardiovascular;  Laterality: N/A;  . ESOPHAGEAL DILATION     multiple procedures  . HERNIA REPAIR  [id number]/[id number]   umbilical hernia  . INGUINAL HERNIA REPAIR  11/28/2011   Procedure: HERNIA REPAIR INGUINAL ADULT;  Surgeon: Imogene Burn. Georgette Dover, MD;  Location: [place] [facility];  Service: General;  Laterality: Right;  right inguinal hernia repair with mesh  . KNEE SURGERY     Right  . LEFT HEART CATH AND CORONARY ANGIOGRAPHY N/A 12/03/2019   Procedure: LEFT HEART CATH AND CORONARY ANGIOGRAPHY;  Surgeon: Jettie Booze, MD;  Location: [place] [facility];  Service: Cardiovascular;  Laterality: N/A;  . LEFT HEART CATH AND CORS/GRAFTS ANGIOGRAPHY N/A 03/16/2020   Procedure: LEFT HEART CATH AND CORS/GRAFTS ANGIOGRAPHY;  Surgeon: Martinique, Peter M, MD;  Location: [place] [facility];  Service: Cardiovascular;  Laterality: N/A;  . POLYPECTOMY    . TEE WITHOUT CARDIOVERSION N/A 12/20/2019   Procedure: TRANSESOPHAGEAL ECHOCARDIOGRAM (TEE);  Surgeon: Prescott Gum, Collier Salina, MD;  Location: [place];  Service: Open Heart Surgery;  Laterality: N/A;  . TONSILLECTOMY  1958  . UMBILICAL HERNIA REPAIR  11/2006  . US ECHOCARDIOGRAPHY       Current Outpatient Medications  Medication Sig Dispense Refill  . acetaminophen (TYLENOL) 650 MG CR tablet Take 1,300 mg by mouth every 8 (eight) hours as needed for pain.    . Alirocumab (PRALUENT) 150 MG/ML SOAJ Inject 150 mg into the skin every 14 (fourteen) days. 2 pen 11  . aspirin EC 81 MG tablet Take 1 tablet (81 mg total) by mouth daily.    . B Complex-C (B-COMPLEX WITH  VITAMIN C) tablet Take 1 tablet by mouth daily with breakfast.     . cetirizine (ZYRTEC) 10 MG tablet Take 10 mg by mouth daily.    . clopidogrel (PLAVIX) 75 MG tablet Take 1 tablet (75 mg total) by mouth daily with breakfast. 30 tablet 11  . empagliflozin (JARDIANCE) 25 MG TABS tablet Take 1 tablet (25 mg total) by mouth daily before breakfast. 30 tablet   . fluticasone (FLONASE) 50 MCG/ACT nasal spray Place 2 sprays into both nostrils daily as needed for allergies.    . isosorbide mononitrate (IMDUR) 30 MG 24 hr tablet Take 1 tablet (30 mg total) by mouth  daily. 90 tablet 3  . lansoprazole (PREVACID) 15 MG capsule Take 15 mg by mouth daily.    Marland Kitchen lisinopril (ZESTRIL) 20 MG tablet TAKE 1 TABLET BY MOUTH DAILY 30 tablet 11  . metoprolol tartrate (LOPRESSOR) 25 MG tablet Take 1 tablet (25 mg total) by mouth 2 (two) times daily. 180 tablet 3  . Multiple Minerals-Vitamins (CALCIUM-MAGNESIUM-ZINC-D3) TABS Take 1 tablet by mouth daily with lunch.     . nitroGLYCERIN (NITROSTAT) 0.4 MG SL tablet Place 1 tablet (0.4 mg total) under the tongue every 5 (five) minutes as needed. 25 tablet 11  . OneTouch Delica Lancets 22G MISC 1 each by Other route daily.    Glory Rosebush VERIO test strip 1 each by Other route daily.    . Tamsulosin HCl (FLOMAX) 0.4 MG CAPS Take 0.4 mg by mouth daily after supper.     No current facility-administered medications for this visit.    Allergies:   Statins, Bismuth subsalicylate, Codeine, Kiwi extract, Morphine and related, and Altace [ramipril]    Social History:  The patient  reports that he quit smoking about 6 years ago. His smoking use included cigarettes and pipe. He has a 22.00 pack-year smoking history. He has never used smokeless tobacco. He reports that he does not drink alcohol and does not use drugs.   Family History:  The patient's family history includes Cancer in his paternal grandmother; Heart disease in his brother, maternal grandfather, mother, and sister;  Ovarian cancer in his paternal grandmother.    ROS:  Please see the history of present illness.   Otherwise, review of systems are positive for none.   All other systems are reviewed and negative.    PHYSICAL EXAM: VS:  BP (!) 154/76   Pulse 68   Ht 5' 8.5" (1.74 m)   Wt 162 lb (73.5 kg)   SpO2 96%   BMI 24.27 kg/m  , BMI Body mass index is 24.27 kg/m. GEN: Well nourished, well developed, in no acute distress HEENT: normal Neck: no JVD, carotid bruits, or masses Cardiac: RRR; no murmurs, rubs, or gallops, no edema; +point tenderness to palpation left upper sternal margin Respiratory:  clear to auscultation bilaterally, normal work of breathing GI: soft, nontender, nondistended, + BS MS: no deformity or atrophy Skin: warm and dry, no rash Neuro:  Strength and sensation are intact Psych: euthymic mood, full affect   EKG:  EKG is  ordered today. NSR rate 60.  LAD, LVH, old inferior infarct. No acute change. I have personally reviewed and interpreted this study.     Recent Labs: 03/17/2020: BUN 16; Creatinine, Ser 1.30; Hemoglobin 11.9; Platelets 179; Potassium 3.9; Sodium 138 06/25/2020: ALT 11    Lipid Panel    Component Value Date/Time   CHOL 101 06/25/2020 0925   TRIG 70 06/25/2020 0925   HDL 48 06/25/2020 0925   CHOLHDL 2.1 06/25/2020 0925   CHOLHDL 4.4 03/16/2020 0646   VLDL 21 03/16/2020 0646   LDLCALC 38 06/25/2020 0925    dated 04/14/20: A1c 7.2%.   Wt Readings from Last 3 Encounters:  02/01/21 162 lb (73.5 kg)  12/08/20 157 lb 8 oz (71.4 kg)  07/20/20 156 lb 6.4 oz (70.9 kg)      Other studies Reviewed: Additional studies/ records that were reviewed today include:   Left heart catheterization 03/16/20:  Mid LAD-2 lesion is 100% stenosed.  Mid LAD-1 lesion is 80% stenosed.  Prox LAD lesion is 25% stenosed.  Dist Cx lesion is 90%  stenosed.  A drug-eluting stent was successfully placed using a [place] 0.93O67.  Post intervention,  there is a 0% residual stenosis.  Prox RCA lesion is 65% stenosed.  RPAV lesion is 95% stenosed.  Mid RCA lesion is 30% stenosed.  Dist RCA lesion is 30% stenosed.  2nd Mrg-1 lesion is 99% stenosed.  A drug-eluting stent was successfully placed using a STENT RESOLUTE ONYX 2.5X18.  Post intervention, there is a 0% residual stenosis.  2nd Mrg-2 lesion is 90% stenosed.  A drug-eluting stent was successfully placed using a [place] 1.24P80.  Post intervention, there is a 0% residual stenosis.  LIMA graft was visualized by angiography and is normal in caliber.  The graft exhibits no disease.  SVG graft was visualized by angiography and is normal in caliber.  The graft exhibits no disease.  SVG graft was visualized by angiography.  SVG graft was visualized by angiography.  Origin lesion is 100% stenosed.  Origin to Prox Graft lesion is 100% stenosed.  LV end diastolic pressure is normal.  1. Severe 3 vessel obstructive CAD 2. Patent LIMA to the LAD 3. Patent SVG to the diagonal 4. Occluded SVG to OM2. The graft appeared to insert into a segment of severe disease 5. Occluded SVG to RCA possibly due to competitive filling from the native vessel 6. Successful PCI of OM2 with DES x 2 in proximal and distal vessel 7. Successful PCI of the distal LCx with DES x 1.  8. Normal LVEDP  Plan: DAPT for at least one year. Will DC Eliquis since he has had no Afib since DC from surgery. DC amiodarone. Intolerant to multiple cholesterol lowering drugs in the past. Will see lipid clinic as outpatient to get started on PCSK 9 inhibitor. I doubt the lesion in the proximal RCA is flow limiting so we will treat this medically for now. If he has recurrent angina we could consider PCI of this vessel. Patient may be a candidate for DC tomorrow if clinically stable.   Echocardiogram 12/2019: 1. Left ventricular ejection fraction, by visual estimation, is 55 to  60%. The left  ventricle has normal function. There is no left ventricular  hypertrophy.  2. Left ventricular diastolic parameters are indeterminate.  3. The left ventricle has no regional wall motion abnormalities.  4. Global right ventricle has normal systolic function.The right  ventricular size is normal. No increase in right ventricular wall  thickness.  5. Left atrial size was mildly dilated.  6. Right atrial size was mildly dilated.  7. The mitral valve is normal in structure. No evidence of mitral valve  regurgitation. No evidence of mitral stenosis.  8. The tricuspid valve is normal in structure.  9. The aortic valve is normal in structure. Aortic valve regurgitation is  not visualized. No evidence of aortic valve sclerosis or stenosis.  10. The pulmonic valve was normal in structure. Pulmonic valve  regurgitation is not visualized.  11. The atrial septum is grossly normal.   ASSESSMENT AND PLAN:   1. CAD s/p CABG 12/2019 with subsequent PCI/DES to OM2 x2 and LCx x1 03/16/20 with cardiac cath showing occlusion of SVG to OM1 and SVG to PDA: patent LIMA to LAD and SVG to diagonal - now having symptoms concerning for USAP. Relieved with sl Ntg. Is under increased stress and symptoms are worse with cold exposure.  - Continue aspirin and plavix -bradycardia has improved with reduction in metoprolol dose.  - will  increase Imdur to 60  mg daily - given ongoing symptoms of chest pain I have recommended cardiac cath with possible PCI. The procedure and risks were reviewed including but not limited to death, myocardial infarction, stroke, arrythmias, bleeding, transfusion, emergency surgery, dye allergy, or renal dysfunction. The patient voices understanding and is agreeable to proceed.   2. HTN: BP is well controlled at home - Coninue metoprolol and lisinopril  3. HLD: LDL 137 03/16/20. He is intolerant to statins - now on Praluent with excellent response. LDL 38. Will update labs today.  4.  DM type 2: A1C 7.2 - Continue metformin and Jardiance - follow up with Dr Brigitte Pulse. History of hypoglycemia on glipizide.   5. Post-op atrial fibrillation: occurred in the post CABG setting and no evidence of recurrence.  - Continue to monitor for recurrence.   6. Cognitive impairment. Wife notes he had this prior to surgery but it is worse since surgery progressive. Referral to Neuro on March 25.     Signed, Peter Martinique, MD  02/01/2021 10:55 AM

## 2021-02-01 NOTE — Addendum Note (Signed)
Addended by: Kathyrn Lass on: 02/01/2021 11:24 AM   Modules accepted: Orders

## 2021-02-01 NOTE — H&P (View-Only) (Signed)
Cardiology Office Note   Date:  02/01/2021   ID:  ANEUDY CHAMPLAIN, DOB 1946/06/25, MRN [id number]  PCP:  Marton Redwood, MD  Cardiologist:  Grayson White Martinique, MD EP: None  Chief Complaint  Patient presents with  . Chest Pain      History of Present Illness: [name] is a 75 y.o. male who is seen for evaluation of chest pain. He has a  PMH of CAD s/p CABG 12/2019. Then presented with STEMI 03/16/20 with PCI/DES of OM2 x2 and PCI/DES of dLCx- SVG to OM and PDA occluded. He had post-op atrial fibrillation, HTN, HLD, and DM type 2.  In January 2021 he presented with chest pain and had an abnormal stress test. Subsequent cardiac cath showed severe 3 vessel CAD. He underwent CABG by Dr Prescott Gum on 12/20/19 including LIMA to LAD, SVG to diagonal, SVG to OM1 and SVG to PDA. Post op course with uneventful.   He was admitted to [facility] from 03/16/20-03/17/20 after presenting with chest pain where he was found to have a STEMI. He was taken emergently to the cath lab which revealed occluded SVG to OM2 and RCA. Patent LIMA to LAD and SVG to diagonal. OM 2 has two separate high grade stenosis and he underwent successful PCI of OM2 with DES x 2 in proximal and distal vessel. Also underwent successful PCI of the distal LCx (90% stenosis) with DES x 1. He did have a 70% proximal RCA proximally and a diffusely diseased PLA branch which fills by left-to-right collaterals medically managed. He was recommended for DAPT with aspirin and plavix. He was recommended to follow-up with the lipid clinic for PSK9-I consideration given prior intolerance to statins and LDL 137 03/16/20. His anticoagulation and amiodarone were discontinued given Afib history occurred following CABG without reoccurrence.   He was seen in our lipid clinic in May and started on Praluent. When  seen in August noted a focal area of discomfort in the left parasternal area. Completed Cardiac Rehab. Noted memory loss has been worse since bypass  surgery.  Was seen on Jan 4 with  some chest pain. Not associated with activity. Hits him hard in his anterior chest to the point he feels like he might pass out. No dyspnea or diaphoresis. Symptoms last a few seconds and all have lasted less than one minute so he hasn't taken Ntg. We reduced his beta blocker due to bradycardia. Imdur was increased to 30 mg daily.   On follow up today he is seen with his wife and he reports that for the last 2 weeks he has more chest pain. He relates it to being in the cold. Doesn't last long. Did take 2 sl Ntg today. Pain is mid sternal radiating to the neck, left arm, and back. Notes he is under stress since his dog is ill. He is scheduled to see Neuro on March 25 for cognitive impairment.     Past Medical History:  Diagnosis Date  . Arthritis   . Arthritis   . Atrial fibrillation ([place])   . BPH (benign prostatic hypertrophy)   . CAD (coronary artery disease)    stents placed  . Diabetes mellitus   . Diverticulosis   . Dyspnea    when exerting self  . Dysrhythmia    "extra beats"  . Esophageal stricture   . GERD (gastroesophageal reflux disease)   . Headache(784.0)   . Hemorrhoids   . Hernia, inguinal, right   . Hiatal  hernia   . Hyperlipidemia   . Hypertension   . Pneumonia   . Staph infection    Left ankle  . 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 Urinary hesitancy     Past Surgical History:  Procedure Laterality Date  . ANKLE ARTHROPLASTY  2006  . CARDIOVASCULAR STRESS TEST  01/22/2009   EF 51%, NO ISCHEMIA  . CARDIOVASCULAR STRESS TEST    . COLONOSCOPY    . CORONARY ANGIOPLASTY WITH STENT PLACEMENT  03/2005  . CORONARY ARTERY BYPASS GRAFT N/A 12/20/2019   Procedure: CORONARY ARTERY BYPASS GRAFTING (CABG) times four, using left internal mammary artery and left greater saphenous vein;  Surgeon: Ivin Poot, MD;  Location: [facility];  Service: Open Heart Surgery;  Laterality: N/A;  . CORONARY STENT INTERVENTION N/A 03/16/2020    Procedure: CORONARY STENT INTERVENTION;  Surgeon: Martinique, Jayshon Dommer M, MD;  Location: [place] [facility];  Service: Cardiovascular;  Laterality: N/A;  . CORONARY STENT PLACEMENT  03/10/2005   SUCCESSFUL STENTING OF THE MID AND CRUX OF THE RCA  . CORONARY/GRAFT ACUTE MI REVASCULARIZATION N/A 03/16/2020   Procedure: Coronary/Graft Acute MI Revascularization;  Surgeon: Martinique, Rayanne Padmanabhan M, MD;  Location: [facility];  Service: Cardiovascular;  Laterality: N/A;  . ESOPHAGEAL DILATION     multiple procedures  . HERNIA REPAIR  29/56/2130   umbilical hernia  . INGUINAL HERNIA REPAIR  11/28/2011   Procedure: HERNIA REPAIR INGUINAL ADULT;  Surgeon: Imogene Burn. Georgette Dover, MD;  Location: [place];  Service: General;  Laterality: Right;  right inguinal hernia repair with mesh  . KNEE SURGERY     Right  . LEFT HEART CATH AND CORONARY ANGIOGRAPHY N/A 12/03/2019   Procedure: LEFT HEART CATH AND CORONARY ANGIOGRAPHY;  Surgeon: Jettie Booze, MD;  Location: [place] [facility];  Service: Cardiovascular;  Laterality: N/A;  . LEFT HEART CATH AND CORS/GRAFTS ANGIOGRAPHY N/A 03/16/2020   Procedure: LEFT HEART CATH AND CORS/GRAFTS ANGIOGRAPHY;  Surgeon: Martinique, Taegen Delker M, MD;  Location: [facility];  Service: Cardiovascular;  Laterality: N/A;  . POLYPECTOMY    . TEE WITHOUT CARDIOVERSION N/A 12/20/2019   Procedure: TRANSESOPHAGEAL ECHOCARDIOGRAM (TEE);  Surgeon: Prescott Gum, Collier Salina, MD;  Location: [facility];  Service: Open Heart Surgery;  Laterality: N/A;  . TONSILLECTOMY  1958  . UMBILICAL HERNIA REPAIR  11/2006  . US ECHOCARDIOGRAPHY       Current Outpatient Medications  Medication Sig Dispense Refill  . acetaminophen (TYLENOL) 650 MG CR tablet Take 1,300 mg by mouth every 8 (eight) hours as needed for pain.    . Alirocumab (PRALUENT) 150 MG/ML SOAJ Inject 150 mg into the skin every 14 (fourteen) days. 2 pen 11  . aspirin EC 81 MG tablet Take 1 tablet (81 mg total) by mouth daily.    . B Complex-C (B-COMPLEX WITH  VITAMIN C) tablet Take 1 tablet by mouth daily with breakfast.     . cetirizine (ZYRTEC) 10 MG tablet Take 10 mg by mouth daily.    . clopidogrel (PLAVIX) 75 MG tablet Take 1 tablet (75 mg total) by mouth daily with breakfast. 30 tablet 11  . empagliflozin (JARDIANCE) 25 MG TABS tablet Take 1 tablet (25 mg total) by mouth daily before breakfast. 30 tablet   . fluticasone (FLONASE) 50 MCG/ACT nasal spray Place 2 sprays into both nostrils daily as needed for allergies.    . isosorbide mononitrate (IMDUR) 30 MG 24 hr tablet Take 1 tablet (30 mg total) by mouth  daily. 90 tablet 3  . lansoprazole (PREVACID) 15 MG capsule Take 15 mg by mouth daily.    Marland Kitchen lisinopril (ZESTRIL) 20 MG tablet TAKE 1 TABLET BY MOUTH DAILY 30 tablet 11  . metoprolol tartrate (LOPRESSOR) 25 MG tablet Take 1 tablet (25 mg total) by mouth 2 (two) times daily. 180 tablet 3  . Multiple Minerals-Vitamins (CALCIUM-MAGNESIUM-ZINC-D3) TABS Take 1 tablet by mouth daily with lunch.     . nitroGLYCERIN (NITROSTAT) 0.4 MG SL tablet Place 1 tablet (0.4 mg total) under the tongue every 5 (five) minutes as needed. 25 tablet 11  . OneTouch Delica Lancets 15Q MISC 1 each by Other route daily.    Glory Rosebush VERIO test strip 1 each by Other route daily.    . Tamsulosin HCl (FLOMAX) 0.4 MG CAPS Take 0.4 mg by mouth daily after supper.     No current facility-administered medications for this visit.    Allergies:   Statins, Bismuth subsalicylate, Codeine, Kiwi extract, Morphine and related, and Altace [ramipril]    Social History:  The patient  reports that he quit smoking about 6 years ago. His smoking use included cigarettes and pipe. He has a 22.00 pack-year smoking history. He has never used smokeless tobacco. He reports that he does not drink alcohol and does not use drugs.   Family History:  The patient's family history includes Cancer in his paternal grandmother; Heart disease in his brother, maternal grandfather, mother, and sister;  Ovarian cancer in his paternal grandmother.    ROS:  Please see the history of present illness.   Otherwise, review of systems are positive for none.   All other systems are reviewed and negative.    PHYSICAL EXAM: VS:  BP (!) 154/76   Pulse 68   Ht 5' 8.5" (1.74 m)   Wt 162 lb (73.5 kg)   SpO2 96%   BMI 24.27 kg/m  , BMI Body mass index is 24.27 kg/m. GEN: Well nourished, well developed, in no acute distress HEENT: normal Neck: no JVD, carotid bruits, or masses Cardiac: RRR; no murmurs, rubs, or gallops, no edema; +point tenderness to palpation left upper sternal margin Respiratory:  clear to auscultation bilaterally, normal work of breathing GI: soft, nontender, nondistended, + BS MS: no deformity or atrophy Skin: warm and dry, no rash Neuro:  Strength and sensation are intact Psych: euthymic mood, full affect   EKG:  EKG is  ordered today. NSR rate 60.  LAD, LVH, old inferior infarct. No acute change. I have personally reviewed and interpreted this study.     Recent Labs: 03/17/2020: BUN 16; Creatinine, Ser 1.30; Hemoglobin 11.9; Platelets 179; Potassium 3.9; Sodium 138 06/25/2020: ALT 11    Lipid Panel    Component Value Date/Time   CHOL 101 06/25/2020 0925   TRIG 70 06/25/2020 0925   HDL 48 06/25/2020 0925   CHOLHDL 2.1 06/25/2020 0925   CHOLHDL 4.4 03/16/2020 0646   VLDL 21 03/16/2020 0646   LDLCALC 38 06/25/2020 0925    dated 04/14/20: A1c 7.2%.   Wt Readings from Last 3 Encounters:  02/01/21 162 lb (73.5 kg)  12/08/20 157 lb 8 oz (71.4 kg)  07/20/20 156 lb 6.4 oz (70.9 kg)      Other studies Reviewed: Additional studies/ records that were reviewed today include:   Left heart catheterization 03/16/20:  Mid LAD-2 lesion is 100% stenosed.  Mid LAD-1 lesion is 80% stenosed.  Prox LAD lesion is 25% stenosed.  Dist Cx lesion is 90%  stenosed.  A drug-eluting stent was successfully placed using a [place] 1.96Q22.  Post intervention,  there is a 0% residual stenosis.  Prox RCA lesion is 65% stenosed.  RPAV lesion is 95% stenosed.  Mid RCA lesion is 30% stenosed.  Dist RCA lesion is 30% stenosed.  2nd Mrg-1 lesion is 99% stenosed.  A drug-eluting stent was successfully placed using a STENT RESOLUTE ONYX 2.5X18.  Post intervention, there is a 0% residual stenosis.  2nd Mrg-2 lesion is 90% stenosed.  A drug-eluting stent was successfully placed using a [place] 9.79G92.  Post intervention, there is a 0% residual stenosis.  LIMA graft was visualized by angiography and is normal in caliber.  The graft exhibits no disease.  SVG graft was visualized by angiography and is normal in caliber.  The graft exhibits no disease.  SVG graft was visualized by angiography.  SVG graft was visualized by angiography.  Origin lesion is 100% stenosed.  Origin to Prox Graft lesion is 100% stenosed.  LV end diastolic pressure is normal.  1. Severe 3 vessel obstructive CAD 2. Patent LIMA to the LAD 3. Patent SVG to the diagonal 4. Occluded SVG to OM2. The graft appeared to insert into a segment of severe disease 5. Occluded SVG to RCA possibly due to competitive filling from the native vessel 6. Successful PCI of OM2 with DES x 2 in proximal and distal vessel 7. Successful PCI of the distal LCx with DES x 1.  8. Normal LVEDP  Plan: DAPT for at least one year. Will DC Eliquis since he has had no Afib since DC from surgery. DC amiodarone. Intolerant to multiple cholesterol lowering drugs in the past. Will see lipid clinic as outpatient to get started on PCSK 9 inhibitor. I doubt the lesion in the proximal RCA is flow limiting so we will treat this medically for now. If he has recurrent angina we could consider PCI of this vessel. Patient may be a candidate for DC tomorrow if clinically stable.   Echocardiogram 12/2019: 1. Left ventricular ejection fraction, by visual estimation, is 55 to  60%. The left  ventricle has normal function. There is no left ventricular  hypertrophy.  2. Left ventricular diastolic parameters are indeterminate.  3. The left ventricle has no regional wall motion abnormalities.  4. Global right ventricle has normal systolic function.The right  ventricular size is normal. No increase in right ventricular wall  thickness.  5. Left atrial size was mildly dilated.  6. Right atrial size was mildly dilated.  7. The mitral valve is normal in structure. No evidence of mitral valve  regurgitation. No evidence of mitral stenosis.  8. The tricuspid valve is normal in structure.  9. The aortic valve is normal in structure. Aortic valve regurgitation is  not visualized. No evidence of aortic valve sclerosis or stenosis.  10. The pulmonic valve was normal in structure. Pulmonic valve  regurgitation is not visualized.  11. The atrial septum is grossly normal.   ASSESSMENT AND PLAN:   1. CAD s/p CABG 12/2019 with subsequent PCI/DES to OM2 x2 and LCx x1 03/16/20 with cardiac cath showing occlusion of SVG to OM1 and SVG to PDA: patent LIMA to LAD and SVG to diagonal - now having symptoms concerning for USAP. Relieved with sl Ntg. Is under increased stress and symptoms are worse with cold exposure.  - Continue aspirin and plavix -bradycardia has improved with reduction in metoprolol dose.  - will  increase Imdur to 60  mg daily - given ongoing symptoms of chest pain I have recommended cardiac cath with possible PCI. The procedure and risks were reviewed including but not limited to death, myocardial infarction, stroke, arrythmias, bleeding, transfusion, emergency surgery, dye allergy, or renal dysfunction. The patient voices understanding and is agreeable to proceed.   2. HTN: BP is well controlled at home - Coninue metoprolol and lisinopril  3. HLD: LDL 137 03/16/20. He is intolerant to statins - now on Praluent with excellent response. LDL 38. Will update labs today.  4.  DM type 2: A1C 7.2 - Continue metformin and Jardiance - follow up with Dr Brigitte Pulse. History of hypoglycemia on glipizide.   5. Post-op atrial fibrillation: occurred in the post CABG setting and no evidence of recurrence.  - Continue to monitor for recurrence.   6. Cognitive impairment. Wife notes he had this prior to surgery but it is worse since surgery progressive. Referral to Neuro on March 25.     Signed, Khaled Herda Martinique, MD  02/01/2021 10:55 AM

## 2021-02-01 NOTE — Patient Instructions (Signed)
Medication Instructions:  Continue all medications *If you need a refill on your cardiac medications before your next appointment, please call your pharmacy*   Lab Work: Bmet,cbc,pt,lipid and hepatic panels today   Testing/Procedures: Cardiac Cath   Follow instructions below   Follow-Up: At [facility], you and your health needs are our priority.  As part of our continuing mission to provide you with exceptional heart care, we have created designated Provider Care Teams.  These Care Teams include your primary Cardiologist (physician) and Advanced Practice Providers (APPs -  Physician Assistants and Nurse Practitioners) who all work together to provide you with the care you need, when you need it.  We recommend signing up for the patient portal called "MyChart".  Sign up information is provided on this After Visit Summary.  MyChart is used to connect with patients for Virtual Visits (Telemedicine).  Patients are able to view lab/test results, encounter notes, upcoming appointments, etc.  Non-urgent messages can be sent to your provider as well.   To learn more about what you can do with MyChart, go to [email or web address].    Your next appointment:  Thursday  02/25/21 at 8::40 am   The format for your next appointment: Office   Provider:  [place] [address] Dept: [place]: [phone]  HAMILTON MARINELLO  02/01/2021  You are scheduled for a Cardiac Cath on Tuesday 02/09/21,  with Dr.Jordan.  1. Please arrive at the [facility] (Main Entrance A) at [facility]: [address] [place], [place] [address] at 5:30 am (This time is two hours before your procedure to ensure your preparation). Free valet parking service is available.   Special note: Every effort is made to have your procedure done on time. Please understand that  emergencies sometimes delay scheduled procedures.  2. Diet: Do not eat solid foods after midnight.  The patient may have clear liquids until 5am upon the day of the procedure.  3. Labs: You will need to have blood drawn on Monday 02/01/21 at Dr.Jordan's officeYou do not need to be fasting.  Covid Test   Saturday 02/06/21 at 11:10 am   At [address] Thru Site   Quarantine until after cath  4. Medication instructions in preparation for your procedure:    On the morning of your procedure, take your Aspirin and Plavix and any morning medicines NOT listed above.  You may use sips of water.  5. Plan for one night stay--bring personal belongings. 6. Bring a current list of your medications and current insurance cards. 7. You MUST have a responsible person to drive you home. 8. Someone MUST be with you the first 24 hours after you arrive home or your discharge will be delayed. 9. Please wear clothes that are easy to get on and off and wear slip-on shoes.  Thank you for allowing [place] to care for you!   -- [facility]

## 2021-02-01 NOTE — Telephone Encounter (Signed)
Spoke with pt wife, she reports yesterday after walking out to get the paper he had chest pain. He did not use NTG. This morning he woke her up complaining of chest pain that was relieved by NTG. She reports the pain went into his back as with his previous MI. Follow up scheduled with Dr Martinique today for follow up.

## 2021-02-02 LAB — BASIC METABOLIC PANEL
BUN/Creatinine Ratio: 19 (ref 10–24)
BUN: 24 mg/dL (ref 8–27)
CO2: 23 mmol/L (ref 20–29)
Calcium: 9.4 mg/dL (ref 8.6–10.2)
Chloride: 101 mmol/L (ref 96–106)
Creatinine, Ser: 1.28 mg/dL — ABNORMAL HIGH (ref 0.76–1.27)
Glucose: 178 mg/dL — ABNORMAL HIGH (ref 65–99)
Potassium: 4 mmol/L (ref 3.5–5.2)
Sodium: 140 mmol/L (ref 134–144)
eGFR: 58 mL/min/{1.73_m2} — ABNORMAL LOW (ref >59)

## 2021-02-02 LAB — HEPATIC FUNCTION PANEL
ALT: 11 IU/L (ref 0–44)
AST: 15 IU/L (ref 0–40)
Albumin: 4.6 g/dL (ref 3.7–4.7)
Alkaline Phosphatase: 70 IU/L (ref 44–121)
Bilirubin Total: 0.4 mg/dL (ref 0.0–1.2)
Bilirubin, Direct: 0.14 mg/dL (ref 0.00–0.40)
Total Protein: 6.9 g/dL (ref 6.0–8.5)

## 2021-02-02 LAB — PT AND PTT
INR: 1.1 (ref 0.9–1.2)
Prothrombin Time: 11 s (ref 9.1–12.0)
aPTT: 26 s (ref 24–33)

## 2021-02-02 LAB — CBC WITH DIFFERENTIAL/PLATELET
Basophils Absolute: 0 10*3/uL (ref 0.0–0.2)
Basos: 1 %
EOS (ABSOLUTE): 0.1 10*3/uL (ref 0.0–0.4)
Eos: 1 %
Hematocrit: 40.6 % (ref 37.5–51.0)
Hemoglobin: 13 g/dL (ref 13.0–17.7)
Immature Grans (Abs): 0 10*3/uL (ref 0.0–0.1)
Immature Granulocytes: 0 %
Lymphocytes Absolute: 0.9 10*3/uL (ref 0.7–3.1)
Lymphs: 18 %
MCH: 26.2 pg — ABNORMAL LOW (ref 26.6–33.0)
MCHC: 32 g/dL (ref 31.5–35.7)
MCV: 82 fL (ref 79–97)
Monocytes Absolute: 0.3 10*3/uL (ref 0.1–0.9)
Monocytes: 7 %
Neutrophils Absolute: 3.7 10*3/uL (ref 1.4–7.0)
Neutrophils: 73 %
Platelets: 152 10*3/uL (ref 150–450)
RBC: 4.96 x10E6/uL (ref 4.14–5.80)
RDW: 15 % (ref 11.6–15.4)
WBC: 5 10*3/uL (ref 3.4–10.8)

## 2021-02-02 LAB — LIPID PANEL
Chol/HDL Ratio: 2.3 ratio (ref 0.0–5.0)
Cholesterol, Total: 122 mg/dL (ref 100–199)
HDL: 53 mg/dL (ref >39)
LDL Chol Calc (NIH): 50 mg/dL (ref 0–99)
Triglycerides: 106 mg/dL (ref 0–149)
VLDL Cholesterol Cal: 19 mg/dL (ref 5–40)

## 2021-02-06 ENCOUNTER — Other Ambulatory Visit (HOSPITAL_COMMUNITY)
Admission: RE | Admit: 2021-02-06 | Discharge: 2021-02-06 | Disposition: A | Discharge status: Home / Self Care | Payer: BC Managed Care – PPO | Source: Ambulatory Visit | Attending: Cardiology | Admitting: Cardiology

## 2021-02-06 DIAGNOSIS — Z01812 Encounter for preprocedural laboratory examination: Secondary | ICD-10-CM | POA: Insufficient documentation

## 2021-02-06 DIAGNOSIS — Z9102 Food additives allergy status: Secondary | ICD-10-CM | POA: Diagnosis not present

## 2021-02-06 DIAGNOSIS — Z955 Presence of coronary angioplasty implant and graft: Secondary | ICD-10-CM | POA: Diagnosis not present

## 2021-02-06 DIAGNOSIS — I480 Paroxysmal atrial fibrillation: Secondary | ICD-10-CM | POA: Diagnosis not present

## 2021-02-06 DIAGNOSIS — Z888 Allergy status to other drugs, medicaments and biological substances status: Secondary | ICD-10-CM | POA: Diagnosis not present

## 2021-02-06 DIAGNOSIS — Z20822 Contact with and (suspected) exposure to covid-19: Secondary | ICD-10-CM | POA: Insufficient documentation

## 2021-02-06 DIAGNOSIS — Z7982 Long term (current) use of aspirin: Secondary | ICD-10-CM | POA: Diagnosis not present

## 2021-02-06 DIAGNOSIS — I10 Essential (primary) hypertension: Secondary | ICD-10-CM | POA: Diagnosis not present

## 2021-02-06 DIAGNOSIS — R413 Other amnesia: Secondary | ICD-10-CM | POA: Diagnosis not present

## 2021-02-06 DIAGNOSIS — Z7902 Long term (current) use of antithrombotics/antiplatelets: Secondary | ICD-10-CM | POA: Diagnosis not present

## 2021-02-06 DIAGNOSIS — Z8249 Family history of ischemic heart disease and other diseases of the circulatory system: Secondary | ICD-10-CM | POA: Diagnosis not present

## 2021-02-06 DIAGNOSIS — Z79899 Other long term (current) drug therapy: Secondary | ICD-10-CM | POA: Diagnosis not present

## 2021-02-06 DIAGNOSIS — Z87891 Personal history of nicotine dependence: Secondary | ICD-10-CM | POA: Diagnosis not present

## 2021-02-06 DIAGNOSIS — Z885 Allergy status to narcotic agent status: Secondary | ICD-10-CM | POA: Diagnosis not present

## 2021-02-06 DIAGNOSIS — I25700 Atherosclerosis of coronary artery bypass graft(s), unspecified, with unstable angina pectoris: Secondary | ICD-10-CM | POA: Diagnosis not present

## 2021-02-06 DIAGNOSIS — E785 Hyperlipidemia, unspecified: Secondary | ICD-10-CM | POA: Diagnosis not present

## 2021-02-06 DIAGNOSIS — E119 Type 2 diabetes mellitus without complications: Secondary | ICD-10-CM | POA: Diagnosis not present

## 2021-02-06 LAB — SARS CORONAVIRUS 2 (TAT 6-24 HRS): SARS Coronavirus 2: NEGATIVE

## 2021-02-08 ENCOUNTER — Telehealth: Payer: Self-pay | Admitting: *Deleted

## 2021-02-08 NOTE — Telephone Encounter (Signed)
Pt contacted pre-catheterization scheduled at [facility] for: Tuesday February 09, 2021 7:30 AM Verified arrival time and place: [facility]) at: 5:30 AM   No solid food after midnight prior to cath, clear liquids until 5 AM day of procedure.  Hold: Lisinopril-day before and day of procedure-GFR 58 Jardiance-day of procedure   Except hold medications AM meds can be  taken pre-cath with sips of water including: ASA 81 mg Plavix 75 mg   Confirmed patient has responsible adult to drive home post procedure and be with patient first 24 hours after arriving home: yes  You are allowed ONE visitor in the waiting room during the time you are at the hospital for your procedure. Both you and your visitor must wear a mask once you enter the hospital.   Reviewed procedure/mask/visitor instructions with patient's wife (DPR).

## 2021-02-09 ENCOUNTER — Encounter (HOSPITAL_COMMUNITY)
Admission: RE | Disposition: A | Discharge status: Home / Self Care | Payer: Self-pay | Source: Home / Self Care | Attending: Cardiology

## 2021-02-09 ENCOUNTER — Other Ambulatory Visit: Payer: Self-pay | Admitting: Cardiology

## 2021-02-09 ENCOUNTER — Other Ambulatory Visit: Payer: Self-pay

## 2021-02-09 ENCOUNTER — Ambulatory Visit (HOSPITAL_COMMUNITY)
Admission: RE | Admit: 2021-02-09 | Discharge: 2021-02-09 | Disposition: A | Discharge status: Home / Self Care | Payer: BC Managed Care – PPO | Attending: Cardiology | Admitting: Cardiology

## 2021-02-09 ENCOUNTER — Encounter (HOSPITAL_COMMUNITY): Payer: Self-pay | Admitting: Cardiology

## 2021-02-09 DIAGNOSIS — Z888 Allergy status to other drugs, medicaments and biological substances status: Secondary | ICD-10-CM | POA: Insufficient documentation

## 2021-02-09 DIAGNOSIS — Z7902 Long term (current) use of antithrombotics/antiplatelets: Secondary | ICD-10-CM | POA: Insufficient documentation

## 2021-02-09 DIAGNOSIS — Z955 Presence of coronary angioplasty implant and graft: Secondary | ICD-10-CM | POA: Insufficient documentation

## 2021-02-09 DIAGNOSIS — Z9102 Food additives allergy status: Secondary | ICD-10-CM | POA: Insufficient documentation

## 2021-02-09 DIAGNOSIS — Z87891 Personal history of nicotine dependence: Secondary | ICD-10-CM | POA: Insufficient documentation

## 2021-02-09 DIAGNOSIS — Z79899 Other long term (current) drug therapy: Secondary | ICD-10-CM | POA: Insufficient documentation

## 2021-02-09 DIAGNOSIS — I480 Paroxysmal atrial fibrillation: Secondary | ICD-10-CM | POA: Diagnosis present

## 2021-02-09 DIAGNOSIS — E785 Hyperlipidemia, unspecified: Secondary | ICD-10-CM | POA: Diagnosis present

## 2021-02-09 DIAGNOSIS — Z7982 Long term (current) use of aspirin: Secondary | ICD-10-CM | POA: Insufficient documentation

## 2021-02-09 DIAGNOSIS — Z951 Presence of aortocoronary bypass graft: Secondary | ICD-10-CM

## 2021-02-09 DIAGNOSIS — I10 Essential (primary) hypertension: Secondary | ICD-10-CM | POA: Diagnosis present

## 2021-02-09 DIAGNOSIS — I25110 Atherosclerotic heart disease of native coronary artery with unstable angina pectoris: Secondary | ICD-10-CM

## 2021-02-09 DIAGNOSIS — Z95820 Peripheral vascular angioplasty status with implants and grafts: Secondary | ICD-10-CM

## 2021-02-09 DIAGNOSIS — Z885 Allergy status to narcotic agent status: Secondary | ICD-10-CM | POA: Insufficient documentation

## 2021-02-09 DIAGNOSIS — E119 Type 2 diabetes mellitus without complications: Secondary | ICD-10-CM | POA: Diagnosis not present

## 2021-02-09 DIAGNOSIS — I200 Unstable angina: Secondary | ICD-10-CM | POA: Diagnosis present

## 2021-02-09 DIAGNOSIS — I25700 Atherosclerosis of coronary artery bypass graft(s), unspecified, with unstable angina pectoris: Secondary | ICD-10-CM | POA: Insufficient documentation

## 2021-02-09 DIAGNOSIS — Z20822 Contact with and (suspected) exposure to covid-19: Secondary | ICD-10-CM | POA: Insufficient documentation

## 2021-02-09 DIAGNOSIS — R413 Other amnesia: Secondary | ICD-10-CM | POA: Insufficient documentation

## 2021-02-09 DIAGNOSIS — Z8249 Family history of ischemic heart disease and other diseases of the circulatory system: Secondary | ICD-10-CM | POA: Insufficient documentation

## 2021-02-09 HISTORY — PX: CORONARY BALLOON ANGIOPLASTY: CATH118233

## 2021-02-09 HISTORY — PX: CORONARY STENT INTERVENTION: CATH118234

## 2021-02-09 HISTORY — PX: LEFT HEART CATH AND CORS/GRAFTS ANGIOGRAPHY: CATH118250

## 2021-02-09 LAB — POCT ACTIVATED CLOTTING TIME
Activated Clotting Time: 368 seconds
Activated Clotting Time: 208 seconds
Activated Clotting Time: 422 seconds

## 2021-02-09 LAB — GLUCOSE, CAPILLARY
Glucose-Capillary: 134 mg/dL — ABNORMAL HIGH (ref 70–99)
Glucose-Capillary: 122 mg/dL — ABNORMAL HIGH (ref 70–99)

## 2021-02-09 SURGERY — LEFT HEART CATH AND CORS/GRAFTS ANGIOGRAPHY
Anesthesia: LOCAL

## 2021-02-09 MED ORDER — MIDAZOLAM HCL 2 MG/2ML IJ SOLN
INTRAMUSCULAR | Status: AC
  Filled 2021-02-09: qty 2

## 2021-02-09 MED ORDER — ACETAMINOPHEN ER 650 MG PO TBCR: 1300.0000 mg | EXTENDED_RELEASE_TABLET | Freq: Three times a day (TID) | ORAL | Status: DC | PRN

## 2021-02-09 MED ORDER — TAMSULOSIN HCL 0.4 MG PO CAPS: 0.4000 mg | ORAL_CAPSULE | Freq: Every day | ORAL | Status: DC

## 2021-02-09 MED ORDER — ASPIRIN EC 81 MG PO TBEC: 81.0000 mg | DELAYED_RELEASE_TABLET | Freq: Every day | ORAL | Status: DC

## 2021-02-09 MED ORDER — SODIUM CHLORIDE 0.9 % WEIGHT BASED INFUSION: 1.0000 mL/kg/h | INTRAVENOUS | Status: DC

## 2021-02-09 MED ORDER — PANTOPRAZOLE SODIUM 40 MG PO TBEC: 40.0000 mg | DELAYED_RELEASE_TABLET | Freq: Every day | ORAL | Status: DC

## 2021-02-09 MED ORDER — FENTANYL CITRATE (PF) 100 MCG/2ML IJ SOLN
INTRAMUSCULAR | Status: AC
  Filled 2021-02-09: qty 2

## 2021-02-09 MED ORDER — METOPROLOL TARTRATE 12.5 MG HALF TABLET: 25.0000 mg | ORAL_TABLET | Freq: Two times a day (BID) | ORAL | Status: DC

## 2021-02-09 MED ORDER — ONDANSETRON HCL 4 MG/2ML IJ SOLN: 4.0000 mg | Freq: Four times a day (QID) | INTRAMUSCULAR | Status: DC | PRN

## 2021-02-09 MED ORDER — MIDAZOLAM HCL 2 MG/2ML IJ SOLN
INTRAMUSCULAR | Status: DC | PRN
  Administered 2021-02-09 (×2): 1 mg via INTRAVENOUS

## 2021-02-09 MED ORDER — B COMPLEX-C PO TABS: 1.0000 | ORAL_TABLET | Freq: Every day | ORAL | Status: DC

## 2021-02-09 MED ORDER — LIDOCAINE HCL (PF) 1 % IJ SOLN
INTRAMUSCULAR | Status: AC
  Filled 2021-02-09: qty 30

## 2021-02-09 MED ORDER — FENTANYL CITRATE (PF) 100 MCG/2ML IJ SOLN
INTRAMUSCULAR | Status: DC | PRN
  Administered 2021-02-09 (×2): 25 ug via INTRAVENOUS

## 2021-02-09 MED ORDER — HEPARIN (PORCINE) IN NACL 1000-0.9 UT/500ML-% IV SOLN
INTRAVENOUS | Status: AC
  Filled 2021-02-09: qty 500

## 2021-02-09 MED ORDER — CLOPIDOGREL BISULFATE 75 MG PO TABS: 75.0000 mg | ORAL_TABLET | Freq: Every day | ORAL | Status: DC

## 2021-02-09 MED ORDER — CLOPIDOGREL BISULFATE 75 MG PO TABS: 75.0000 mg | ORAL_TABLET | Freq: Every day | ORAL | 3 refills | Status: DC

## 2021-02-09 MED ORDER — SODIUM CHLORIDE 0.9% FLUSH: 3.0000 mL | Freq: Two times a day (BID) | INTRAVENOUS | Status: DC

## 2021-02-09 MED ORDER — CALCIUM-MAGNESIUM-ZINC-D3 PO TABS: 1.0000 | ORAL_TABLET | Freq: Every day | ORAL | Status: DC

## 2021-02-09 MED ORDER — HEPARIN SODIUM (PORCINE) 1000 UNIT/ML IJ SOLN
INTRAMUSCULAR | Status: DC | PRN
  Administered 2021-02-09: 4000 [IU] via INTRAVENOUS
  Administered 2021-02-09: 3000 [IU] via INTRAVENOUS
  Administered 2021-02-09: 4000 [IU] via INTRAVENOUS

## 2021-02-09 MED ORDER — FLUTICASONE PROPIONATE 50 MCG/ACT NA SUSP: 2.0000 | Freq: Every day | NASAL | Status: DC | PRN

## 2021-02-09 MED ORDER — SODIUM CHLORIDE 0.9 % IV SOLN: 250.0000 mL | INTRAVENOUS | Status: DC | PRN

## 2021-02-09 MED ORDER — ISOSORBIDE MONONITRATE ER 60 MG PO TB24: 60.0000 mg | ORAL_TABLET | Freq: Every day | ORAL | Status: DC

## 2021-02-09 MED ORDER — HYDRALAZINE HCL 20 MG/ML IJ SOLN: 10.0000 mg | INTRAMUSCULAR | Status: DC | PRN

## 2021-02-09 MED ORDER — LIDOCAINE HCL (PF) 1 % IJ SOLN
INTRAMUSCULAR | Status: DC | PRN
  Administered 2021-02-09: 2 mL

## 2021-02-09 MED ORDER — SODIUM CHLORIDE 0.9 % WEIGHT BASED INFUSION
3.0000 mL/kg/h | INTRAVENOUS | Status: AC
  Administered 2021-02-09: 3 mL/kg/h via INTRAVENOUS

## 2021-02-09 MED ORDER — HEPARIN SODIUM (PORCINE) 1000 UNIT/ML IJ SOLN
INTRAMUSCULAR | Status: AC
  Filled 2021-02-09: qty 1

## 2021-02-09 MED ORDER — EMPAGLIFLOZIN 25 MG PO TABS: 25.0000 mg | ORAL_TABLET | Freq: Every day | ORAL | Status: DC

## 2021-02-09 MED ORDER — LISINOPRIL 10 MG PO TABS: 20.0000 mg | ORAL_TABLET | Freq: Every day | ORAL | Status: DC

## 2021-02-09 MED ORDER — VERAPAMIL HCL 2.5 MG/ML IV SOLN
INTRAVENOUS | Status: DC | PRN
  Administered 2021-02-09: 10 mL via INTRA_ARTERIAL

## 2021-02-09 MED ORDER — NITROGLYCERIN 1 MG/10 ML FOR IR/CATH LAB
INTRA_ARTERIAL | Status: AC
  Filled 2021-02-09: qty 10

## 2021-02-09 MED ORDER — HEPARIN (PORCINE) IN NACL 1000-0.9 UT/500ML-% IV SOLN
INTRAVENOUS | Status: DC | PRN
  Administered 2021-02-09 (×2): 500 mL

## 2021-02-09 MED ORDER — ALIROCUMAB 150 MG/ML ~~LOC~~ SOAJ: 150.0000 mg | SUBCUTANEOUS | Status: DC

## 2021-02-09 MED ORDER — NITROGLYCERIN 0.4 MG SL SUBL: 0.4000 mg | SUBLINGUAL_TABLET | SUBLINGUAL | Status: DC | PRN

## 2021-02-09 MED ORDER — IOHEXOL 350 MG/ML SOLN
INTRAVENOUS | Status: DC | PRN
  Administered 2021-02-09: 135 mL

## 2021-02-09 MED ORDER — IOHEXOL 350 MG/ML SOLN
INTRAVENOUS | Status: AC
  Filled 2021-02-09: qty 1

## 2021-02-09 MED ORDER — NITROGLYCERIN 1 MG/10 ML FOR IR/CATH LAB
INTRA_ARTERIAL | Status: DC | PRN
  Administered 2021-02-09 (×2): 200 ug

## 2021-02-09 MED ORDER — SODIUM CHLORIDE 0.9% FLUSH: 3.0000 mL | INTRAVENOUS | Status: DC | PRN

## 2021-02-09 MED ORDER — ASPIRIN 81 MG PO CHEW: 81.0000 mg | CHEWABLE_TABLET | ORAL | Status: DC

## 2021-02-09 MED ORDER — LORATADINE 10 MG PO TABS: 10.0000 mg | ORAL_TABLET | Freq: Every day | ORAL | Status: DC

## 2021-02-09 MED ORDER — VERAPAMIL HCL 2.5 MG/ML IV SOLN
INTRAVENOUS | Status: AC
  Filled 2021-02-09: qty 2

## 2021-02-09 SURGICAL SUPPLY — 21 items
BALLN SAPPHIRE 2.0X12 (BALLOONS) ×2
BALLN SAPPHIRE ~~LOC~~ 3.25X8 (BALLOONS) ×1 IMPLANT
BALLN WOLVERINE 2.50X10 (BALLOONS) ×2
BALLOON SAPPHIRE 2.0X12 (BALLOONS) IMPLANT
BALLOON WOLVERINE 2.50X10 (BALLOONS) IMPLANT
CATH INFINITI 5 FR IM (CATHETERS) ×1 IMPLANT
CATH INFINITI 5FR MULTPACK ANG (CATHETERS) ×1 IMPLANT
CATH LAUNCHER 6FR EBU3.5 (CATHETERS) ×1 IMPLANT
DEVICE RAD COMP TR BAND LRG (VASCULAR PRODUCTS) ×1 IMPLANT
GLIDESHEATH SLEND SS 6F .021 (SHEATH) ×1 IMPLANT
GUIDE CATH VISTA JR4 6F (CATHETERS) ×1 IMPLANT
GUIDEWIRE INQWIRE 1.5J.035X260 (WIRE) IMPLANT
INQWIRE 1.5J .035X260CM (WIRE) ×2
KIT ENCORE 26 ADVANTAGE (KITS) ×1 IMPLANT
KIT ESSENTIALS PG (KITS) ×1 IMPLANT
KIT HEART LEFT (KITS) ×2 IMPLANT
PACK CARDIAC CATHETERIZATION (CUSTOM PROCEDURE TRAY) ×2 IMPLANT
STENT RESOLUTE ONYX 3.0X12 (Permanent Stent) ×1 IMPLANT
TRANSDUCER W/STOPCOCK (MISCELLANEOUS) ×2 IMPLANT
TUBING CIL FLEX 10 FLL-RA (TUBING) ×2 IMPLANT
WIRE ASAHI PROWATER 180CM (WIRE) ×1 IMPLANT

## 2021-02-09 NOTE — Progress Notes (Signed)
Assumed care of patient from Susa Raring, RN. Assessment documented.

## 2021-02-09 NOTE — Progress Notes (Signed)
[id number] Returned to continue education with wife who voiced understanding. Stressed importance of plavix with stent. Reviewed NTG use, gave heart healthy and diabetic diets and walking instructions. Referral to [facility] CRP 2. Pt attended last year. Graylon Good RN BSN 02/09/2021 1:35 PM

## 2021-02-09 NOTE — Discharge Instructions (Signed)
Radial Site Care  This sheet gives you information about how to care for yourself after your procedure. Your health care provider may also give you more specific instructions. If you have problems or questions, contact your health care provider. What can I expect after the procedure? After the procedure, it is common to have:  Bruising and tenderness at the catheter insertion area. Follow these instructions at home: Medicines  Take over-the-counter and prescription medicines only as told by your health care provider. Insertion site care 1. Follow instructions from your health care provider about how to take care of your insertion site. Make sure you: ? Wash your hands with soap and water before you remove your bandage (dressing). If soap and water are not available, use hand sanitizer. ? May remove dressing in 24 hours. 2. Check your insertion site every day for signs of infection. Check for: ? Redness, swelling, or pain. ? Fluid or blood. ? Pus or a bad smell. ? Warmth. 3. Do no take baths, swim, or use a hot tub for 5 days. 4. You may shower 24-48 hours after the procedure. ? Remove the dressing and gently wash the site with plain soap and water. ? Pat the area dry with a clean towel. ? Do not rub the site. That could cause bleeding. 5. Do not apply powder or lotion to the site. Activity  1. For 24 hours after the procedure, or as directed by your health care provider: ? Do not flex or bend the affected arm. ? Do not push or pull heavy objects with the affected arm. ? Do not drive yourself home from the hospital or clinic. You may drive 24 hours after the procedure. ? Do not operate machinery or power tools. ? KEEP ARM ELEVATED THE REMAINDER OF THE DAY. 2. Do not push, pull or lift anything that is heavier than 10 lb for 5 days. 3. Ask your health care provider when it is okay to: ? Return to work or school. ? Resume usual physical activities or sports. ? Resume sexual  activity. General instructions  If the catheter site starts to bleed, raise your arm and put firm pressure on the site. If the bleeding does not stop, get help right away. This is a medical emergency.  DRINK PLENTY OF FLUIDS FOR THE NEXT 2-3 DAYS.  No alcohol consumption for 24 hours after receiving sedation.  If you went home on the same day as your procedure, a responsible adult should be with you for the first 24 hours after you arrive home.  Keep all follow-up visits as told by your health care provider. This is important. Contact a health care provider if:  You have a fever.  You have redness, swelling, or yellow drainage around your insertion site. Get help right away if:  You have unusual pain at the radial site.  The catheter insertion area swells very fast.  The insertion area is bleeding, and the bleeding does not stop when you hold steady pressure on the area.  Your arm or hand becomes pale, cool, tingly, or numb. These symptoms may represent a serious problem that is an emergency. Do not wait to see if the symptoms will go away. Get medical help right away. Call your local emergency services (911 in the [place] Do not drive yourself to the hospital. Summary  After the procedure, it is common to have bruising and tenderness at the site.  Follow instructions from your health care provider about how to take care   of your radial site wound. Check the wound every day for signs of infection.  This information is not intended to replace advice given to you by your health care provider. Make sure you discuss any questions you have with your health care provider. Document Revised: 12/27/2017 Document Reviewed: 12/27/2017 Elsevier Patient Education  2020 Elsevier Inc. 

## 2021-02-09 NOTE — Interval H&P Note (Signed)
History and Physical Interval Note:  02/09/2021 7:24 AM  [name]  has presented today for surgery, with the diagnosis of chest pain.  The various methods of treatment have been discussed with the patient and family. After consideration of risks, benefits and other options for treatment, the patient has consented to  Procedure(s): LEFT HEART CATH AND CORS/GRAFTS ANGIOGRAPHY (N/A) as a surgical intervention.  The patient's history has been reviewed, patient examined, no change in status, stable for surgery.  I have reviewed the patient's chart and labs.  Questions were answered to the patient's satisfaction.   Cath Lab Visit (complete for each Cath Lab visit)  Clinical Evaluation Leading to the Procedure:   ACS: Yes.    Non-ACS:    Anginal Classification: CCS III  Anti-ischemic medical therapy: Maximal Therapy (2 or more classes of medications)  Non-Invasive Test Results: No non-invasive testing performed  Prior CABG: Previous CABG        Collier Salina [facility] 02/09/2021 7:24 AM

## 2021-02-09 NOTE — Progress Notes (Signed)
[id number] Came to see pt to educate. Pt does not remember if he attended CRP 2 [place] and having difficulty remembering ed. Will return when wife here. Graylon Good RN BSN 02/09/2021 11:19 AM

## 2021-02-09 NOTE — Discharge Summary (Signed)
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  Prox LAD lesion is 25% stenosed.  Mid LAD-2 lesion is 100% stenosed.  Previously placed Dist Cx drug eluting stent is widely patent.  RPAV lesion is 99% stenosed.  Mid RCA lesion is 30% stenosed.  Dist RCA lesion is 30% stenosed.  Prox RCA lesion is 80% stenosed.  A drug-eluting stent was successfully placed using a STENT RESOLUTE ONYX 3.0X12.  Post intervention, there is a 0% residual stenosis.  LIMA and is normal in caliber.  The graft exhibits no disease.  SVG and is normal in caliber.  The graft exhibits no disease.  SVG.  Origin lesion is 100% stenosed.  SVG.  Origin to Prox Graft lesion is 100% stenosed.  2nd Mrg-1 lesion is 90% stenosed.  Post intervention, there is a 0% residual stenosis.  Scoring balloon angioplasty was performed using a BALLOON WOLVERINE 2.50X10.  2nd Mrg-2 lesion is 90% stenosed.  Scoring balloon angioplasty was performed using a BALLOON WOLVERINE 2.50X10.  Post intervention, there is a 35% residual stenosis.  The left ventricular systolic function is normal.  LV end diastolic pressure is normal.  The left ventricular ejection fraction is 55-65% by visual estimate.   1. 3 vessel obstructive CAD 2. Patent LIMA to the LAD 3. Patent SVG to the diagonal. 4. Known occlusion of SVG to OM2 and SVG to PDA 5. Restenosis of the stents  in the proximal and distal OM2 6. Patent stent in the distal LCx. 7. Normal LV function 8. Normal LVEDP 9. Successful scoring balloon angioplasty of the stents in the proximal and distal OM2 10 Successful PCI of the proximal RCA with DES x 1.  Plan: DAPT indefinitely. Continue aggressive medical therapy. Patient is a candidate for same day DC.   Diagnostic Dominance: Right    Intervention     _____________   History of Present Illness     [name] is a 75 y.o. male who was seen in the office on 2/28 for evaluation of chest pain. He has a  PMH of CAD s/p CABG 12/2019. Then presented with STEMI 03/16/20 with PCI/DES of OM2 x2 and PCI/DES of dLCx- SVG to OM and PDA occluded. He had post-op atrial fibrillation, HTN, HLD, and DM type 2.  In January 2021 he presented with chest pain and had an abnormal stress test. Subsequent cardiac cath showed severe 3 vessel CAD. He underwent CABG by Dr Prescott Gum on 12/20/19 including LIMA to LAD, SVG to diagonal, SVG to OM1 and SVG to PDA. Post op course was uneventful.   He was admitted to [facility] from 03/16/20-03/17/20 after presenting with chest pain where he was found to have a STEMI. He was taken emergently to the cath lab which revealed occluded SVG to [facility] RCA. Patent LIMA to LAD and SVG to diagonal. OM 2 has two separate high grade stenosis and he underwent successful PCI of OM2 with DES  x 2 in proximal and distal vessel. Also underwent successful PCI of the distal LCx(90% stenosis)with DES x 1. He did have a 70% proximal RCA proximally and a diffusely diseased PLA branch which fills by left-to-right collaterals medically managed. He was recommended for DAPT with aspirin and plavix. He was recommended to follow-up with the lipid clinic for PSK9-I consideration given prior intolerance to statins and LDL 137 03/16/20. His anticoagulation and amiodarone were discontinued given Afib history occurred following CABG without reoccurrence.    He was seen in our lipid clinic in May and started on Praluent. When  seen in August noted a focal area of discomfort in the left parasternal area. Completed Cardiac Rehab. 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  Was seen on Jan 4 with  some chest pain. Not associated with activity. Hits him hard in his anterior chest to the point he feels like he might pass out. No dyspnea or diaphoresis. Symptoms last a few seconds and all have lasted less than one minute so he hasn't taken Ntg. Reduced his beta blocker due to bradycardia. Imdur was increased to 30 mg daily.   On follow up in the office he was seen with his wife and he reported that for the last 2 weeks prior to appt he had more chest pain. He related it to being in the cold. Doesn't last long. Did take 2 sl Ntg that day with improvement. Pain was mid sternal radiating to the neck, left arm, and back. Notes he was under stress since his dog is ill. He is scheduled to see Neuro on March 25 for cognitive impairment. Given concern with unstable angina he was set up for outpatient cardiac cath.  Hospital Course     The patient underwent cardiac cath as noted above with underlying 3v CAD, patent LIMA-LAD, SVG-Diag, known occlusion of SVG-OM2 and SVG-PDA. New restenosis of the stents in the p/dOM2 with successful scoring balloon angioplasty of the stents in the p/dOM2, successful PCI/DES x1 to the pRCA. Plan for DAPT with ASA/plavix indefinitely. The patient was seen by cardiac rehab while in short stay. There were no observed complications post cath. Radial cath site was re-evaluated prior to discharge and found to be stable without any complications. Instructions/precautions regarding cath site care were given prior to discharge.  [name] was seen by Dr. Martinique and determined stable for discharge home. Follow up with our office has been arranged. Medications are listed below. Pertinent changes include  N/a.  _____________  Cath/PCI Registry Performance & Quality Measures: 1. Aspirin prescribed? - Yes 2. ADP Receptor Inhibitor (Plavix/Clopidogrel, Brilinta/Ticagrelor or Effient/Prasugrel) prescribed (includes medically managed patients)? - Yes 3. High Intensity Statin (Lipitor 40-80mg  or Crestor 20-40mg ) prescribed? - No - intolerant, on repatha 4. For EF <40%, was ACEI/ARB prescribed? - Not Applicable (EF >/= 40%) 5. For EF <40%, Aldosterone Antagonist (Spironolactone or Eplerenone) prescribed? - Not Applicable (EF >/= 98%) 6. Cardiac Rehab Phase II ordered (Included Medically managed Patients)? - Yes  _____________   Discharge Vitals Blood pressure 134/76, pulse (!) 52, temperature 98.6 F (37 C), temperature source Oral, resp. rate 14, height 5' 8.5" (1.74 m), weight 73.5 kg, SpO2 99 %.  Filed Weights   02/09/21 0543  Weight: 73.5 kg    Last Labs & Radiologic Studies    CBC No results for input(s): WBC, NEUTROABS, HGB, HCT, MCV, PLT in the last 72 hours. Basic Metabolic Panel No results for input(s): NA, K, CL, CO2, GLUCOSE, BUN,  CREATININE, CALCIUM, MG, PHOS in the last 72 hours. Liver Function Tests No results for input(s): AST, ALT, ALKPHOS, BILITOT, PROT, ALBUMIN in the last 72 hours. No results for input(s): LIPASE, AMYLASE in the last 72 hours. High Sensitivity Troponin:   No results for input(s): TROPONINIHS in the last 720 hours.  BNP Invalid input(s): POCBNP D-Dimer No results for input(s): DDIMER in the last 72 hours. Hemoglobin A1C No results for input(s): HGBA1C in the last 72 hours. Fasting Lipid Panel No results for input(s): CHOL, HDL, LDLCALC, TRIG, CHOLHDL, LDLDIRECT in the last 72 hours. Thyroid Function Tests No results for input(s): TSH, T4TOTAL, T3FREE, THYROIDAB in the last 72 hours.  Invalid input(s): FREET3 _____________  CARDIAC CATHETERIZATION  Result Date: 02/09/2021  Mid LAD-1 lesion is 80% stenosed.  Prox LAD lesion is 25% stenosed.   Mid LAD-2 lesion is 100% stenosed.  Previously placed Dist Cx drug eluting stent is widely patent.  RPAV lesion is 99% stenosed.  Mid RCA lesion is 30% stenosed.  Dist RCA lesion is 30% stenosed.  Prox RCA lesion is 80% stenosed.  A drug-eluting stent was successfully placed using a STENT RESOLUTE ONYX 3.0X12.  Post intervention, there is a 0% residual stenosis.  LIMA and is normal in caliber.  The graft exhibits no disease.  SVG and is normal in caliber.  The graft exhibits no disease.  SVG.  Origin lesion is 100% stenosed.  SVG.  Origin to Prox Graft lesion is 100% stenosed.  2nd Mrg-1 lesion is 90% stenosed.  Post intervention, there is a 0% residual stenosis.  Scoring balloon angioplasty was performed using a BALLOON WOLVERINE 2.50X10.  2nd Mrg-2 lesion is 90% stenosed.  Scoring balloon angioplasty was performed using a BALLOON WOLVERINE 2.50X10.  Post intervention, there is a 35% residual stenosis.  The left ventricular systolic function is normal.  LV end diastolic pressure is normal.  The left ventricular ejection fraction is 55-65% by visual estimate.  1. 3 vessel obstructive CAD 2. Patent LIMA to the LAD 3. Patent SVG to the diagonal. 4. Known occlusion of SVG to OM2 and SVG to PDA 5. Restenosis of the stents in the proximal and distal OM2 6. Patent stent in the distal LCx. 7. Normal LV function 8. Normal LVEDP 9. Successful scoring balloon angioplasty of the stents in the proximal and distal OM2 10 Successful PCI of the proximal RCA with DES x 1. Plan: DAPT indefinitely. Continue aggressive medical therapy. Patient is a candidate for same day DC.    Disposition   Pt is being discharged home today in good condition.  Follow-up Plans & Appointments     Follow-up Information    Martinique, Peter M, MD Follow up on 02/25/2021.   Specialty: Cardiology Why: at 8:40am for your follow up appt Contact information: [address] [place]  [phone]               Discharge Instructions    Amb Referral to Cardiac Rehabilitation   Complete by: As directed    Diagnosis: Coronary Stents   After initial evaluation and assessments completed: Virtual Based Care may be provided alone or in conjunction with Phase 2 Cardiac Rehab based on patient barriers.: Yes       Discharge Medications   Allergies as of 02/09/2021      Reactions   Statins Anaphylaxis   Myalgias   Bismuth Subsalicylate Nausea And Vomiting   Codeine Nausea And Vomiting   Kiwi Extract    Makes  face red, break out into bumps on body.    Morphine And Related Nausea And Vomiting   Altace [ramipril] Rash      Medication List    TAKE these medications   acetaminophen 650 MG CR tablet Commonly known as: TYLENOL Take 1,300 mg by mouth every 8 (eight) hours as needed for pain.   aspirin EC 81 MG tablet Take 1 tablet (81 mg total) by mouth daily.   B-complex with vitamin C tablet Take 1 tablet by mouth daily with breakfast.   Calcium-Magnesium-Zinc-D3 Tabs Take 1 tablet by mouth daily with lunch.   cetirizine 10 MG tablet Commonly known as: ZYRTEC Take 10 mg by mouth daily.   clopidogrel 75 MG tablet Commonly known as: PLAVIX Take 1 tablet (75 mg total) by mouth daily with breakfast.   empagliflozin 25 MG Tabs tablet Commonly known as: JARDIANCE Take 1 tablet (25 mg total) by mouth daily before breakfast.   fluticasone 50 MCG/ACT nasal spray Commonly known as: FLONASE Place 2 sprays into both nostrils daily as needed for allergies.   isosorbide mononitrate 30 MG 24 hr tablet Commonly known as: IMDUR Take 2 tablets (60 mg total) by mouth daily.   lansoprazole 15 MG capsule Commonly known as: PREVACID Take 15 mg by mouth daily.   lisinopril 20 MG tablet Commonly known as: ZESTRIL TAKE 1 TABLET BY MOUTH DAILY   metoprolol tartrate 25 MG tablet Commonly known as: LOPRESSOR Take 1 tablet (25 mg total) by mouth 2 (two) times daily.   nitroGLYCERIN  0.4 MG SL tablet Commonly known as: Nitrostat Place 1 tablet (0.4 mg total) under the tongue every 5 (five) minutes as needed.   OneTouch Delica Lancets 24M Misc 1 each by Other route daily.   OneTouch Verio test strip Generic drug: glucose blood 1 each by Other route daily.   Praluent 150 MG/ML Soaj Generic drug: Alirocumab Inject 150 mg into the skin every 14 (fourteen) days.   tamsulosin 0.4 MG Caps capsule Commonly known as: FLOMAX Take 0.4 mg by mouth daily after supper.          Allergies Allergies  Allergen Reactions  . Statins Anaphylaxis    Myalgias   . Bismuth Subsalicylate Nausea And Vomiting  . Codeine Nausea And Vomiting  . Kiwi Extract     Makes face red, break out into bumps on body.   . Morphine And Related Nausea And Vomiting  . Altace [Ramipril] Rash    Outstanding Labs/Studies   N/a   Duration of Discharge Encounter   Greater than 30 minutes including physician time.  Signed, Reino Bellis, NP 02/09/2021, 3:04 PM

## 2021-02-09 NOTE — Progress Notes (Signed)
Attempted removal of 3cc air from TRB, site bleed, 3cc air reinserted hemostasis obtained, will continue to monitor

## 2021-02-12 ENCOUNTER — Ambulatory Visit: Payer: BC Managed Care – PPO | Admitting: Cardiology

## 2021-02-21 NOTE — Progress Notes (Signed)
Cardiology Office Note   Date:  02/25/2021   ID:  [name], DOB 1946/11/09, MRN [id number]  PCP:  [name]., MD  Cardiologist:   [place], MD EP: None  Chief Complaint  Patient presents with  . Coronary Artery Disease  . Chest Pain      History of Present Illness: [name] is a 75 y.o. male who is seen for evaluation of chest pain. He has a  PMH of CAD s/p CABG 12/2019. Then presented with STEMI 03/16/20 with PCI/DES of OM2 x2 and PCI/DES of dLCx- SVG to OM and PDA occluded. He had post-op atrial fibrillation, HTN, HLD, and DM type 2.  In January 2021 he presented with chest pain and had an abnormal stress test. Subsequent cardiac cath showed severe 3 vessel CAD. He underwent CABG by Dr Prescott Gum on 12/20/19 including LIMA to LAD, SVG to diagonal, SVG to OM1 and SVG to PDA. Post op course with uneventful.   He was admitted to [facility] from 03/16/20-03/17/20 after presenting with chest pain where he was found to have a STEMI. He was taken emergently to the cath lab which revealed occluded SVG to OM2 and RCA. Patent LIMA to LAD and SVG to diagonal. OM 2 has two separate high grade stenosis and he underwent successful PCI of OM2 with DES x 2 in proximal and distal vessel. Also underwent successful PCI of the distal LCx (90% stenosis) with DES x 1. He did have a 70% proximal RCA proximally and a diffusely diseased PLA branch which fills by left-to-right collaterals medically managed. He was recommended for DAPT with aspirin and plavix. He was recommended to follow-up with the lipid clinic for PSK9-I consideration given prior intolerance to statins and LDL 137 03/16/20. His anticoagulation and amiodarone were discontinued given Afib history occurred following CABG without reoccurrence.   He was seen in our lipid clinic in May and started on Praluent. When  seen in August noted a focal area of discomfort in the left parasternal area. Completed Cardiac Rehab. Noted memory  loss has been worse since bypass surgery.  Was seen on Jan 4 with  some chest pain. Not associated with activity. Hits him hard in his anterior chest to the point he feels like he might pass out. No dyspnea or diaphoresis. Symptoms last a few seconds and all have lasted less than one minute so he hasn't taken Ntg. We reduced his beta blocker due to bradycardia. Imdur was increased to 30 mg daily.   When seen recently he had more chest pain. He relates it to being in the cold. Doesn't last long. Did take 2 sl Ntg today. Pain is mid sternal radiating to the neck, left arm, and back. Notes he is under stress since his dog is ill. He is scheduled to see Neuro on March 25 for cognitive impairment.   He underwent cardiac cath on March 8. This demonstrated restenosis in the stents in the OM 2 treated with scoring balloon angioplasty. The proximal RCA was also stented. The stent in the distal LCx was patent. Otherwise stable. Since his intervention he states his chest pain resolved. No cath site complications.     Past Medical History:  Diagnosis Date  . Arthritis   . Arthritis   . Atrial fibrillation ([place])   . BPH (benign prostatic hypertrophy)   . CAD (coronary artery disease)    stents placed  . Diabetes mellitus   . Diverticulosis   .  Dyspnea    when exerting self  . Dysrhythmia    "extra beats"  . Esophageal stricture   . GERD (gastroesophageal reflux disease)   . Headache(784.0)   . Hemorrhoids   . Hernia, inguinal, right   . Hiatal hernia   . Hyperlipidemia   . Hypertension   . Pneumonia   . Staph infection    Left ankle  . 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 Urinary hesitancy     Past Surgical History:  Procedure Laterality Date  . ANKLE ARTHROPLASTY  2006  . CARDIOVASCULAR STRESS TEST  01/22/2009   EF 51%, NO ISCHEMIA  . CARDIOVASCULAR STRESS TEST    . COLONOSCOPY    . CORONARY ANGIOPLASTY WITH STENT PLACEMENT  03/2005  . CORONARY ARTERY BYPASS GRAFT N/A 12/20/2019    Procedure: CORONARY ARTERY BYPASS GRAFTING (CABG) times four, using left internal mammary artery and left greater saphenous vein;  Surgeon: Ivin Poot, MD;  Location: [place];  Service: Open Heart Surgery;  Laterality: N/A;  . CORONARY BALLOON ANGIOPLASTY N/A 02/09/2021   Procedure: CORONARY BALLOON ANGIOPLASTY;  Surgeon: [name],  M, MD;  Location: [place] [facility];  Service: Cardiovascular;  Laterality: N/A;  . CORONARY STENT INTERVENTION N/A 03/16/2020   Procedure: CORONARY STENT INTERVENTION;  Surgeon: [name],  M, MD;  Location: [place] [facility];  Service: Cardiovascular;  Laterality: N/A;  . CORONARY STENT INTERVENTION N/A 02/09/2021   Procedure: CORONARY STENT INTERVENTION;  Surgeon: [name],  M, MD;  Location: [place] [facility];  Service: Cardiovascular;  Laterality: N/A;  . CORONARY STENT PLACEMENT  03/10/2005   SUCCESSFUL STENTING OF THE MID AND CRUX OF THE RCA  . CORONARY/GRAFT ACUTE MI REVASCULARIZATION N/A 03/16/2020   Procedure: Coronary/Graft Acute MI Revascularization;  Surgeon: [name],  M, MD;  Location: [place] [facility];  Service: Cardiovascular;  Laterality: N/A;  . ESOPHAGEAL DILATION     multiple procedures  . HERNIA REPAIR  41/96/2229   umbilical hernia  . INGUINAL HERNIA REPAIR  11/28/2011   Procedure: HERNIA REPAIR INGUINAL ADULT;  Surgeon: Imogene Burn. Georgette Dover, MD;  Location: [place] [facility];  Service: General;  Laterality: Right;  right inguinal hernia repair with mesh  . KNEE SURGERY     Right  . LEFT HEART CATH AND CORONARY ANGIOGRAPHY N/A 12/03/2019   Procedure: LEFT HEART CATH AND CORONARY ANGIOGRAPHY;  Surgeon: Jettie Booze, MD;  Location: [place] [facility];  Service: Cardiovascular;  Laterality: N/A;  . LEFT HEART CATH AND CORS/GRAFTS ANGIOGRAPHY N/A 03/16/2020   Procedure: LEFT HEART CATH AND CORS/GRAFTS ANGIOGRAPHY;  Surgeon: [name],  M, MD;  Location: [place] [facility];  Service: Cardiovascular;  Laterality: N/A;  . LEFT HEART CATH AND  CORS/GRAFTS ANGIOGRAPHY N/A 02/09/2021   Procedure: LEFT HEART CATH AND CORS/GRAFTS ANGIOGRAPHY;  Surgeon: [name],  M, MD;  Location: [place] [facility];  Service: Cardiovascular;  Laterality: N/A;  . POLYPECTOMY    . TEE WITHOUT CARDIOVERSION N/A 12/20/2019   Procedure: TRANSESOPHAGEAL ECHOCARDIOGRAM (TEE);  Surgeon: Prescott Gum, Collier Salina, MD;  Location: [place];  Service: Open Heart Surgery;  Laterality: N/A;  . TONSILLECTOMY  1958  . UMBILICAL HERNIA REPAIR  11/2006  . US ECHOCARDIOGRAPHY       Current Outpatient Medications  Medication Sig Dispense Refill  . acetaminophen (TYLENOL) 650 MG CR tablet Take 1,300 mg by mouth every 8 (eight) hours as needed for pain.    . Alirocumab (PRALUENT) 150 MG/ML SOAJ Inject 150 mg into the  skin every 14 (fourteen) days. 2 pen 11  . aspirin EC 81 MG tablet Take 1 tablet (81 mg total) by mouth daily.    . B Complex-C (B-COMPLEX WITH VITAMIN C) tablet Take 1 tablet by mouth daily with breakfast.     . cetirizine (ZYRTEC) 10 MG tablet Take 10 mg by mouth daily.    . clopidogrel (PLAVIX) 75 MG tablet Take 1 tablet (75 mg total) by mouth daily with breakfast. 90 tablet 3  . empagliflozin (JARDIANCE) 25 MG TABS tablet Take 1 tablet (25 mg total) by mouth daily before breakfast. 30 tablet   . fluticasone (FLONASE) 50 MCG/ACT nasal spray Place 2 sprays into both nostrils daily as needed for allergies.    Marland Kitchen lansoprazole (PREVACID) 15 MG capsule Take 15 mg by mouth daily.    Marland Kitchen lisinopril (ZESTRIL) 20 MG tablet TAKE 1 TABLET BY MOUTH DAILY 30 tablet 11  . metoprolol tartrate (LOPRESSOR) 25 MG tablet Take 1 tablet (25 mg total) by mouth 2 (two) times daily. 180 tablet 3  . Multiple Minerals-Vitamins (CALCIUM-MAGNESIUM-ZINC-D3) TABS Take 1 tablet by mouth daily with lunch.     . nitroGLYCERIN (NITROSTAT) 0.4 MG SL tablet Place 1 tablet (0.4 mg total) under the tongue every 5 (five) minutes as needed. 25 tablet 11  . OneTouch Delica Lancets 06C MISC 1 each by Other  route daily.    Glory Rosebush VERIO test strip 1 each by Other route daily.    . Tamsulosin HCl (FLOMAX) 0.4 MG CAPS Take 0.4 mg by mouth daily after supper.    . isosorbide mononitrate (IMDUR) 30 MG 24 hr tablet Take 1 tablet (30 mg total) by mouth daily. 90 tablet 3   No current facility-administered medications for this visit.    Allergies:   Statins, Bismuth subsalicylate, Codeine, Kiwi extract, Morphine and related, and Altace [ramipril]    Social History:  The patient  reports that he quit smoking about 6 years ago. His smoking use included cigarettes and pipe. He has a 22.00 pack-year smoking history. He has never used smokeless tobacco. He reports that he does not drink alcohol and does not use drugs.   Family History:  The patient's family history includes Cancer in his paternal grandmother; Heart disease in his brother, maternal grandfather, mother, and sister; Ovarian cancer in his paternal grandmother.    ROS:  Please see the history of present illness.   Otherwise, review of systems are positive for none.   All other systems are reviewed and negative.    PHYSICAL EXAM: VS:  BP 132/78   Pulse 88   Ht 5\' 11"  (1.803 m)   Wt 161 lb 6.4 oz (73.2 kg)   SpO2 99%   BMI 22.51 kg/m  , BMI Body mass index is 22.51 kg/m. GEN: Well nourished, well developed, in no acute distress HEENT: normal Neck: no JVD, carotid bruits, or masses Cardiac: RRR; no murmurs, rubs, or gallops, no edema;  Respiratory:  clear to auscultation bilaterally, normal work of breathing GI: soft, nontender, nondistended, + BS MS: no deformity or atrophy. No radial site hematoma. Skin: warm and dry, no rash Neuro:  Strength and sensation are intact Psych: euthymic mood, full affect   EKG:  EKG is not  ordered today.    Recent Labs: 02/01/2021: ALT 11; BUN 24; Creatinine, Ser 1.28; Hemoglobin 13.0; Platelets 152; Potassium 4.0; Sodium 140    Lipid Panel    Component Value Date/Time   CHOL 122  02/01/2021 1136  TRIG 106 02/01/2021 1136   HDL 53 02/01/2021 1136   CHOLHDL 2.3 02/01/2021 1136   CHOLHDL 4.4 03/16/2020 0646   VLDL 21 03/16/2020 0646   LDLCALC 50 02/01/2021 1136    dated 04/14/20: A1c 7.2%.   Wt Readings from Last 3 Encounters:  02/25/21 161 lb 6.4 oz (73.2 kg)  02/09/21 162 lb 0.6 oz (73.5 kg)  02/01/21 162 lb (73.5 kg)      Other studies Reviewed: Additional studies/ records that were reviewed today include:   Left heart catheterization 03/16/20:  Mid LAD-2 lesion is 100% stenosed.  Mid LAD-1 lesion is 80% stenosed.  Prox LAD lesion is 25% stenosed.  Dist Cx lesion is 90% stenosed.  A drug-eluting stent was successfully placed using a [place] 7.82N56.  Post intervention, there is a 0% residual stenosis.  Prox RCA lesion is 65% stenosed.  RPAV lesion is 95% stenosed.  Mid RCA lesion is 30% stenosed.  Dist RCA lesion is 30% stenosed.  2nd Mrg-1 lesion is 99% stenosed.  A drug-eluting stent was successfully placed using a STENT RESOLUTE ONYX 2.5X18.  Post intervention, there is a 0% residual stenosis.  2nd Mrg-2 lesion is 90% stenosed.  A drug-eluting stent was successfully placed using a [place] 2.13Y86.  Post intervention, there is a 0% residual stenosis.  LIMA graft was visualized by angiography and is normal in caliber.  The graft exhibits no disease.  SVG graft was visualized by angiography and is normal in caliber.  The graft exhibits no disease.  SVG graft was visualized by angiography.  SVG graft was visualized by angiography.  Origin lesion is 100% stenosed.  Origin to Prox Graft lesion is 100% stenosed.  LV end diastolic pressure is normal.  1. Severe 3 vessel obstructive CAD 2. Patent LIMA to the LAD 3. Patent SVG to the diagonal 4. Occluded SVG to OM2. The graft appeared to insert into a segment of severe disease 5. Occluded SVG to RCA possibly due to competitive filling from the native  vessel 6. Successful PCI of OM2 with DES x 2 in proximal and distal vessel 7. Successful PCI of the distal LCx with DES x 1.  8. Normal LVEDP  Plan: DAPT for at least one year. Will DC Eliquis since he has had no Afib since DC from surgery. DC amiodarone. Intolerant to multiple cholesterol lowering drugs in the past. Will see lipid clinic as outpatient to get started on PCSK 9 inhibitor. I doubt the lesion in the proximal RCA is flow limiting so we will treat this medically for now. If he has recurrent angina we could consider PCI of this vessel. Patient may be a candidate for DC tomorrow if clinically stable.   Echocardiogram 12/2019: 1. Left ventricular ejection fraction, by visual estimation, is 55 to  60%. The left ventricle has normal function. There is no left ventricular  hypertrophy.  2. Left ventricular diastolic parameters are indeterminate.  3. The left ventricle has no regional wall motion abnormalities.  4. Global right ventricle has normal systolic function.The right  ventricular size is normal. No increase in right ventricular wall  thickness.  5. Left atrial size was mildly dilated.  6. Right atrial size was mildly dilated.  7. The mitral valve is normal in structure. No evidence of mitral valve  regurgitation. No evidence of mitral stenosis.  8. The tricuspid valve is normal in structure.  9. The aortic valve is normal in structure. Aortic valve regurgitation is  not visualized.  No evidence of aortic valve sclerosis or stenosis.  10. The pulmonic valve was normal in structure. Pulmonic valve  regurgitation is not visualized.  11. The atrial septum is grossly normal.   Cardiac cath 02/09/21:  CORONARY BALLOON ANGIOPLASTY  CORONARY STENT INTERVENTION  LEFT HEART CATH AND CORS/GRAFTS ANGIOGRAPHY    Conclusion    Mid LAD-1 lesion is 80% stenosed.  Prox LAD lesion is 25% stenosed.  Mid LAD-2 lesion is 100% stenosed.  Previously placed Dist Cx drug eluting  stent is widely patent.  RPAV lesion is 99% stenosed.  Mid RCA lesion is 30% stenosed.  Dist RCA lesion is 30% stenosed.  Prox RCA lesion is 80% stenosed.  A drug-eluting stent was successfully placed using a STENT RESOLUTE ONYX 3.0X12.  Post intervention, there is a 0% residual stenosis.  LIMA and is normal in caliber.  The graft exhibits no disease.  SVG and is normal in caliber.  The graft exhibits no disease.  SVG.  Origin lesion is 100% stenosed.  SVG.  Origin to Prox Graft lesion is 100% stenosed.  2nd Mrg-1 lesion is 90% stenosed.  Post intervention, there is a 0% residual stenosis.  Scoring balloon angioplasty was performed using a BALLOON [place] 2.50X10.  2nd Mrg-2 lesion is 90% stenosed.  Scoring balloon angioplasty was performed using a BALLOON [place] 2.50X10.  Post intervention, there is a 35% residual stenosis.  The left ventricular systolic function is normal.  LV end diastolic pressure is normal.  The left ventricular ejection fraction is 55-65% by visual estimate.   1. 3 vessel obstructive CAD 2. Patent LIMA to the LAD 3. Patent SVG to the diagonal. 4. Known occlusion of SVG to OM2 and SVG to PDA 5. Restenosis of the stents in the proximal and distal OM2 6. Patent stent in the distal LCx. 7. Normal LV function 8. Normal LVEDP 9. Successful scoring balloon angioplasty of the stents in the proximal and distal OM2 10 Successful PCI of the proximal RCA with DES x 1.  Plan: DAPT indefinitely. Continue aggressive medical therapy. Patient is a candidate for same day DC.     ASSESSMENT AND PLAN:   1. CAD s/p CABG 12/2019 with subsequent PCI/DES to OM2 x2 and LCx x1 03/16/20 with cardiac cath showing occlusion of SVG to OM1 and SVG to PDA: patent LIMA to LAD and SVG to diagonal, s/p scoring balloon PCI of the OM2 for restenosis on 02/09/21. PCI of the proximal RCA with DES.  - now without angina.  - Continue aspirin and  plavix -bradycardia has improved with reduction in metoprolol dose.  - will  Continue Imdur 30 mg daily.  - now s/p scoring balloon PCI of OM2 for in stent restenosis as well as PCI of the proximal RCA with DES.    2. HTN: BP is well controlled  - Coninue metoprolol and lisinopril  3. HLD: LDL 137 03/16/20. He is intolerant to statins - now on Praluent with excellent response. LDL 50  4. DM type 2: A1C 7.2 - Continue metformin and Jardiance - follow up with Dr Brigitte Pulse. History of hypoglycemia on glipizide.   5. Post-op atrial fibrillation: occurred in the post CABG setting and no evidence of recurrence.  - Continue to monitor for recurrence.   6. Cognitive impairment. Wife notes he had this prior to surgery but it is worse since surgery progressive. Referral to Neuro in June.   Signed,  [place], MD  02/25/2021 9:24 AM

## 2021-02-25 ENCOUNTER — Ambulatory Visit (INDEPENDENT_AMBULATORY_CARE_PROVIDER_SITE_OTHER): Payer: BC Managed Care – PPO | Admitting: Cardiology

## 2021-02-25 ENCOUNTER — Other Ambulatory Visit: Payer: Self-pay

## 2021-02-25 ENCOUNTER — Encounter: Payer: Self-pay | Admitting: Cardiology

## 2021-02-25 VITALS — BP 132/78 | HR 88 | Ht 71.0 in | Wt 161.4 lb

## 2021-02-25 DIAGNOSIS — I25700 Atherosclerosis of coronary artery bypass graft(s), unspecified, with unstable angina pectoris: Secondary | ICD-10-CM

## 2021-02-25 DIAGNOSIS — Z951 Presence of aortocoronary bypass graft: Secondary | ICD-10-CM

## 2021-02-25 DIAGNOSIS — E7800 Pure hypercholesterolemia, unspecified: Secondary | ICD-10-CM

## 2021-02-25 DIAGNOSIS — I10 Essential (primary) hypertension: Secondary | ICD-10-CM

## 2021-02-25 MED ORDER — ISOSORBIDE MONONITRATE ER 30 MG PO TB24: 30.0000 mg | ORAL_TABLET | Freq: Every day | ORAL | 3 refills | Status: DC

## 2021-02-26 ENCOUNTER — Ambulatory Visit: Payer: Self-pay | Admitting: Neurology

## 2021-03-01 DIAGNOSIS — Z85828 Personal history of other malignant neoplasm of skin: Secondary | ICD-10-CM | POA: Insufficient documentation

## 2021-03-25 ENCOUNTER — Other Ambulatory Visit: Payer: Self-pay | Admitting: Cardiology

## 2021-03-26 ENCOUNTER — Emergency Department (HOSPITAL_COMMUNITY): Payer: BC Managed Care – PPO

## 2021-03-26 ENCOUNTER — Other Ambulatory Visit: Payer: Self-pay

## 2021-03-26 ENCOUNTER — Encounter (HOSPITAL_COMMUNITY): Payer: Self-pay | Admitting: *Deleted

## 2021-03-26 ENCOUNTER — Telehealth: Payer: Self-pay | Admitting: Cardiology

## 2021-03-26 ENCOUNTER — Emergency Department (HOSPITAL_COMMUNITY)
Admission: EM | Admit: 2021-03-26 | Discharge: 2021-03-26 | Disposition: A | Discharge status: Home / Self Care | Payer: BC Managed Care – PPO | Attending: Emergency Medicine | Admitting: Emergency Medicine

## 2021-03-26 DIAGNOSIS — I10 Essential (primary) hypertension: Secondary | ICD-10-CM | POA: Diagnosis not present

## 2021-03-26 DIAGNOSIS — E119 Type 2 diabetes mellitus without complications: Secondary | ICD-10-CM | POA: Diagnosis not present

## 2021-03-26 DIAGNOSIS — Z951 Presence of aortocoronary bypass graft: Secondary | ICD-10-CM | POA: Diagnosis not present

## 2021-03-26 DIAGNOSIS — R079 Chest pain, unspecified: Secondary | ICD-10-CM | POA: Diagnosis present

## 2021-03-26 DIAGNOSIS — Z7982 Long term (current) use of aspirin: Secondary | ICD-10-CM | POA: Insufficient documentation

## 2021-03-26 DIAGNOSIS — R072 Precordial pain: Secondary | ICD-10-CM | POA: Insufficient documentation

## 2021-03-26 DIAGNOSIS — Z87891 Personal history of nicotine dependence: Secondary | ICD-10-CM | POA: Insufficient documentation

## 2021-03-26 DIAGNOSIS — Z7902 Long term (current) use of antithrombotics/antiplatelets: Secondary | ICD-10-CM | POA: Diagnosis not present

## 2021-03-26 DIAGNOSIS — Z79899 Other long term (current) drug therapy: Secondary | ICD-10-CM | POA: Insufficient documentation

## 2021-03-26 DIAGNOSIS — Z7984 Long term (current) use of oral hypoglycemic drugs: Secondary | ICD-10-CM | POA: Insufficient documentation

## 2021-03-26 DIAGNOSIS — I2510 Atherosclerotic heart disease of native coronary artery without angina pectoris: Secondary | ICD-10-CM | POA: Insufficient documentation

## 2021-03-26 LAB — TROPONIN I (HIGH SENSITIVITY)
Troponin I (High Sensitivity): 24 ng/L — ABNORMAL HIGH (ref <18)
Troponin I (High Sensitivity): 42 ng/L — ABNORMAL HIGH (ref <18)

## 2021-03-26 LAB — CBC WITH DIFFERENTIAL/PLATELET
Abs Immature Granulocytes: 0.02 10*3/uL (ref 0.00–0.07)
Basophils Absolute: 0 10*3/uL (ref 0.0–0.1)
Basophils Relative: 1 %
Eosinophils Absolute: 0.1 10*3/uL (ref 0.0–0.5)
Eosinophils Relative: 1 %
HCT: 39.9 % (ref 39.0–52.0)
Hemoglobin: 12.2 g/dL — ABNORMAL LOW (ref 13.0–17.0)
Immature Granulocytes: 0 %
Lymphocytes Relative: 20 %
Lymphs Abs: 1 10*3/uL (ref 0.7–4.0)
MCH: 26.3 pg (ref 26.0–34.0)
MCHC: 30.6 g/dL (ref 30.0–36.0)
MCV: 86 fL (ref 80.0–100.0)
Monocytes Absolute: 0.4 10*3/uL (ref 0.1–1.0)
Monocytes Relative: 9 %
Neutro Abs: 3.3 10*3/uL (ref 1.7–7.7)
Neutrophils Relative %: 69 %
Platelets: 171 10*3/uL (ref 150–400)
RBC: 4.64 MIL/uL (ref 4.22–5.81)
RDW: 14 % (ref 11.5–15.5)
WBC: 4.9 10*3/uL (ref 4.0–10.5)
nRBC: 0 % (ref 0.0–0.2)

## 2021-03-26 LAB — COMPREHENSIVE METABOLIC PANEL
ALT: 12 U/L (ref 0–44)
AST: 17 U/L (ref 15–41)
Albumin: 3.6 g/dL (ref 3.5–5.0)
Alkaline Phosphatase: 53 U/L (ref 38–126)
Anion gap: 8 (ref 5–15)
BUN: 23 mg/dL (ref 8–23)
CO2: 27 mmol/L (ref 22–32)
Calcium: 8.9 mg/dL (ref 8.9–10.3)
Chloride: 104 mmol/L (ref 98–111)
Creatinine, Ser: 1.51 mg/dL — ABNORMAL HIGH (ref 0.61–1.24)
GFR, Estimated: 48 mL/min — ABNORMAL LOW (ref >60)
Glucose, Bld: 338 mg/dL — ABNORMAL HIGH (ref 70–99)
Potassium: 4.1 mmol/L (ref 3.5–5.1)
Sodium: 139 mmol/L (ref 135–145)
Total Bilirubin: 0.6 mg/dL (ref 0.3–1.2)
Total Protein: 5.8 g/dL — ABNORMAL LOW (ref 6.5–8.1)

## 2021-03-26 MED ORDER — METOPROLOL TARTRATE 37.5 MG PO TABS: 37.5000 mg | ORAL_TABLET | Freq: Two times a day (BID) | ORAL | 0 refills | Status: DC

## 2021-03-26 MED ORDER — METOPROLOL TARTRATE 25 MG PO TABS: 25.0000 mg | ORAL_TABLET | Freq: Once | ORAL | Status: DC

## 2021-03-26 MED ORDER — ASPIRIN 81 MG PO CHEW
243.0000 mg | CHEWABLE_TABLET | Freq: Once | ORAL | Status: AC
  Administered 2021-03-26: 243 mg via ORAL
  Filled 2021-03-26: qty 3

## 2021-03-26 MED ORDER — ASPIRIN 81 MG PO CHEW: 324.0000 mg | CHEWABLE_TABLET | Freq: Once | ORAL | Status: DC

## 2021-03-26 NOTE — ED Triage Notes (Signed)
Emergency Medicine Provider Triage Evaluation Note  [name] , a 75 y.o. male  was evaluated in triage.  Pt complains of chest pain.  Patient reports that chest pain began suddenly at 1530.  Pain is located to his left chest.  Described as a squeezing sensation.  Patient denies any radiation of his pain. Patient reports associated shortness of breath.  Patient reports taking any sublingual nitro which relieved his pain.  Denies any nausea, vomiting, diaphoresis.  Patient has history of CABG.  Patient is former smoker.   Review of Systems  Positive: Chest pain, shortness of breath Negative: Nausea, vomiting, diaphoresis  Physical Exam  Ht 5\' 11"  (1.803 m)   Wt 73.2 kg   BMI 22.51 kg/m  Gen:   Awake, no distress   HEENT:  Atraumatic  Resp:  Normal effort, lungs clear to auscultation bilaterally MSK:   Moves extremities without difficulty  Neuro:  Speech clear   Medical Decision Making  Medically screening exam initiated at 4:19 PM.  Appropriate orders placed.  Tawanna Cooler Scrivner was informed that the remainder of the evaluation will be completed by another provider, this initial triage assessment does not replace that evaluation, and the importance of remaining in the ED until their evaluation is complete.  Clinical Impression   The patient appears stable so that the remainder of the work up may be completed by another provider.      Loni Beckwith, [place] 03/26/21 2213

## 2021-03-26 NOTE — ED Notes (Addendum)
Hx of CABG, multiple MI's in the past and angioplasty a few weeks ago with multiple stents. Here for intermittent chest pain x 2 weeks with dyspnea on exertion. Took 1 SL Nitro with relief. Pt now reports 0/10 chest pain. Took 81mg  Aspirin PO prior to coming to ED. Alert and oriented x 4. Continuous cardiac monitoring initiated.

## 2021-03-26 NOTE — Telephone Encounter (Signed)
   Pt c/o of Chest Pain: STAT if CP now or developed within 24 hours  1. Are you having CP right now? No   2. Are you experiencing any other symptoms (ex. SOB, nausea, vomiting, sweating)? Fast HR  3. How long have you been experiencing CP? Last week, yesterday and 15 mins ago  4. Is your CP continuous or coming and going? coming and going  5. Have you taken Nitroglycerin? Yes, yesterday ? Pt wife calling she said pt been feeling chest tightness last week, and he felt it again took nitroglycerin and he felt it again today about 15 mins ago. She wanted to speak with a nurse to what they need to do

## 2021-03-26 NOTE — Telephone Encounter (Signed)
Received direct call into triage from patients wife, per patients wife patient is actively having substernal Left sided chest pain ever since going outside to check the mail box today. Per patients wife patient has had no relief of symptoms since, and is now experiencing elevated HR in the low 100's per patients wife patient's HR is usually in the 60's. Patients wife reports patient's last blood pressure was 120/75. While on the phone with patient and patients wife, patient took 1 SL nitro. Per patients wife patient does not look well and is losing his color in his face. Advised patients wife to hang up and call 911. Can hear patient in the back ground stating he does not wish to call 911. Advised patient that due to nature of chest pain and patient symptoms she should call 911 for patient. Patients wife verbalized understanding.   Will forward to MD to make aware.

## 2021-03-26 NOTE — ED Notes (Signed)
Pt denies chest pain and sob at this time.

## 2021-03-26 NOTE — ED Provider Notes (Signed)
[facility] EMERGENCY DEPARTMENT Provider Note   CSN: [id number] Arrival date & time: 03/26/21  1606     History Chief Complaint  Patient presents with  . Chest Pain    [name] is a 75 y.o. male.  The history is provided by the patient, medical records and the spouse.  Chest Pain  [name] is a 75 y.o. male who presents to the Emergency Department complaining of pain. He presents the emergency department accompanied by his wife for evaluation of exertional chest pain. He has a history of coronary artery disease and does have some chronic exertional chest pain, particularly when it is cold outside. Now that the weather has warmed up he is still experiencing symptoms and they are more severe than baseline. He attempted to walk to the mailbox today but was unable to make it there before developing severe central chest pain and pounding and felt like his heart was beating fast. He had to go back into rest. He took one nitroglycerin in his symptoms improved within about 10 minutes. No reports of recent illnesses. He minimizes his symptoms but does confess them at the encouragement of his wife.    Past Medical History:  Diagnosis Date  . Arthritis   . Arthritis   . Atrial fibrillation ([place])   . BPH (benign prostatic hypertrophy)   . CAD (coronary artery disease)    stents placed  . Diabetes mellitus   . Diverticulosis   . Dyspnea    when exerting self  . Dysrhythmia    "extra beats"  . Esophageal stricture   . GERD (gastroesophageal reflux disease)   . Headache(784.0)   . Hemorrhoids   . Hernia, inguinal, right   . Hiatal hernia   . Hyperlipidemia   . Hypertension   . Pneumonia   . Staph infection    Left ankle  . 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 Urinary hesitancy     Patient Active Problem List   Diagnosis Date Noted  . Encounter for post surgical wound check 04/08/2020  . History of ST elevation myocardial infarction (STEMI)  03/16/20 with VG failure of bypass grafts and stents to OM and LCX  03/18/2020  . CAD (coronary artery disease) of bypass graft 03/16/20 03/18/2020  . STEMI involving left circumflex coronary artery ([place]) 03/16/2020  . Postop check 01/22/2020  . 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 Unstable angina ([place]) 12/04/2019  . Abnormal nuclear stress test   . PAF (paroxysmal atrial fibrillation) ([place]) 04/21/2016  . Pre-syncope 04/10/2016  . Diabetes ([place]) 04/10/2016  . Hyperlipidemia   . Right inguinal hernia 10/18/2011  . Personal history of colonic polyps 06/13/2011  . Guaiac positive stools 06/13/2011  . GERD (gastroesophageal reflux disease) 06/13/2011  . ARTHRITIS 03/03/2009  . DIABETES MELLITUS 03/02/2009  . Essential hypertension 03/02/2009  . Coronary atherosclerosis 03/02/2009  . HEMORRHOIDS 03/02/2009  . ESOPHAGEAL STRICTURE 03/02/2009  . GERD 03/02/2009  . HIATAL HERNIA 03/02/2009  . POLYP, GALLBLADDER 03/02/2009    Past Surgical History:  Procedure Laterality Date  . ANKLE ARTHROPLASTY  2006  . CARDIOVASCULAR STRESS TEST  01/22/2009   EF 51%, NO ISCHEMIA  . CARDIOVASCULAR STRESS TEST    . COLONOSCOPY    . CORONARY ANGIOPLASTY WITH STENT PLACEMENT  03/2005  . CORONARY ARTERY BYPASS GRAFT N/A 12/20/2019   Procedure: CORONARY ARTERY BYPASS GRAFTING (CABG) times four, using left internal mammary artery and left greater saphenous vein;  Surgeon: Prescott Gum,  Collier Salina, MD;  Location: [place];  Service: Open Heart Surgery;  Laterality: N/A;  . CORONARY BALLOON ANGIOPLASTY N/A 02/09/2021   Procedure: CORONARY BALLOON ANGIOPLASTY;  Surgeon: Martinique, Peter M, MD;  Location: [place] [facility];  Service: Cardiovascular;  Laterality: N/A;  . CORONARY STENT INTERVENTION N/A 03/16/2020   Procedure: CORONARY STENT INTERVENTION;  Surgeon: Martinique, Peter M, MD;  Location: [name] [facility];  Service: Cardiovascular;  Laterality: N/A;  . CORONARY STENT INTERVENTION N/A 02/09/2021   Procedure: CORONARY STENT  INTERVENTION;  Surgeon: Martinique, Peter M, MD;  Location: [place] [facility];  Service: Cardiovascular;  Laterality: N/A;  . CORONARY STENT PLACEMENT  03/10/2005   SUCCESSFUL STENTING OF THE MID AND CRUX OF THE RCA  . CORONARY/GRAFT ACUTE MI REVASCULARIZATION N/A 03/16/2020   Procedure: Coronary/Graft Acute MI Revascularization;  Surgeon: Martinique, Peter M, MD;  Location: [place] [facility];  Service: Cardiovascular;  Laterality: N/A;  . ESOPHAGEAL DILATION     multiple procedures  . HERNIA REPAIR  [id number]/3875   umbilical hernia  . INGUINAL HERNIA REPAIR  11/28/2011   Procedure: HERNIA REPAIR INGUINAL ADULT;  Surgeon: Imogene Burn. Georgette Dover, MD;  Location: [facility];  Service: General;  Laterality: Right;  right inguinal hernia repair with mesh  . KNEE SURGERY     Right  . LEFT HEART CATH AND CORONARY ANGIOGRAPHY N/A 12/03/2019   Procedure: LEFT HEART CATH AND CORONARY ANGIOGRAPHY;  Surgeon: Jettie Booze, MD;  Location: [place] [facility];  Service: Cardiovascular;  Laterality: N/A;  . LEFT HEART CATH AND CORS/GRAFTS ANGIOGRAPHY N/A 03/16/2020   Procedure: LEFT HEART CATH AND CORS/GRAFTS ANGIOGRAPHY;  Surgeon: Martinique, Peter M, MD;  Location: [place] [facility];  Service: Cardiovascular;  Laterality: N/A;  . LEFT HEART CATH AND CORS/GRAFTS ANGIOGRAPHY N/A 02/09/2021   Procedure: LEFT HEART CATH AND CORS/GRAFTS ANGIOGRAPHY;  Surgeon: Martinique, Peter M, MD;  Location: [place] [facility];  Service: Cardiovascular;  Laterality: N/A;  . POLYPECTOMY    . TEE WITHOUT CARDIOVERSION N/A 12/20/2019   Procedure: TRANSESOPHAGEAL ECHOCARDIOGRAM (TEE);  Surgeon: Prescott Gum, Collier Salina, MD;  Location: [facility];  Service: Open Heart Surgery;  Laterality: N/A;  . TONSILLECTOMY  1958  . UMBILICAL HERNIA REPAIR  11/2006  . US ECHOCARDIOGRAPHY         Family History  Problem Relation Age of Onset  . Heart disease Mother   . Heart disease Sister   . Heart disease Brother   . Heart disease Maternal Grandfather   . Ovarian cancer  Paternal Grandmother   . Cancer Paternal Grandmother        cervical  . Colon cancer Neg Hx   . Esophageal cancer Neg Hx   . Stomach cancer Neg Hx     Social History   Tobacco Use  . Smoking status: Former Smoker    Packs/day: 1.00    Years: 22.00    Pack years: 22.00    Types: Cigarettes, Pipe    Quit date: 12/17/2014    Years since quitting: 6.2  . Smokeless tobacco: Never Used  Substance Use Topics  . Alcohol use: No    Alcohol/week: 0.0 standard drinks  . Drug use: No    Home Medications Prior to Admission medications   Medication Sig Start Date End Date Taking? Authorizing Provider  metoprolol tartrate 37.5 MG TABS Take 37.5 mg by mouth 2 (two) times daily. 03/26/21  Yes Quintella Reichert, MD  acetaminophen (TYLENOL) 650 MG CR tablet Take 1,300 mg  by mouth every 8 (eight) hours as needed for pain.    [provider]  aspirin EC 81 MG tablet Take 1 tablet (81 mg total) by mouth daily. 11/22/19   Martinique, Peter M, MD  B Complex-C (B-COMPLEX WITH VITAMIN C) tablet Take 1 tablet by mouth daily with breakfast.     [provider]  cetirizine (ZYRTEC) 10 MG tablet Take 10 mg by mouth daily.    [provider]  clopidogrel (PLAVIX) 75 MG tablet Take 1 tablet (75 mg total) by mouth daily with breakfast. 02/09/21   Martinique, Peter M, MD  clopidogrel (PLAVIX) 75 MG tablet TAKE 1 TABLET (75 MG TOTAL) BY MOUTH DAILY WITH BREAKFAST. 02/09/21 02/09/22  Martinique, Peter M, MD  empagliflozin (JARDIANCE) 25 MG TABS tablet Take 1 tablet (25 mg total) by mouth daily before breakfast. 12/08/20   Martinique, Peter M, MD  fluticasone [facility]) 50 MCG/ACT nasal spray Place 2 sprays into both nostrils daily as needed for allergies.    [provider]  isosorbide mononitrate (IMDUR) 30 MG 24 hr tablet Take 1 tablet (30 mg total) by mouth daily. 02/25/21   Martinique, Peter M, MD  lansoprazole (PREVACID) 15 MG capsule Take 15 mg by mouth daily.    [provider]  lisinopril  (ZESTRIL) 20 MG tablet TAKE 1 TABLET BY MOUTH DAILY 08/12/20   Martinique, Peter M, MD  Multiple Minerals-Vitamins (CALCIUM-MAGNESIUM-ZINC-D3) TABS Take 1 tablet by mouth daily with lunch.     [provider]  nitroGLYCERIN (NITROSTAT) 0.4 MG SL tablet Place 1 tablet (0.4 mg total) under the tongue every 5 (five) minutes as needed. 12/08/20   Martinique, Peter M, MD  OneTouch Delica Lancets 98X MISC 1 each by Other route daily. 02/14/20   [provider]  [facility] VERIO test strip 1 each by Other route daily. 02/13/20   [provider]  PRALUENT 150 MG/ML SOAJ INJECT 150MG  INTO THE SKIN EVERY 14 DAYS 03/25/21   Martinique, Peter M, MD  Tamsulosin HCl (FLOMAX) 0.4 MG CAPS Take 0.4 mg by mouth daily after supper.    [provider]    Allergies    Statins, Bismuth subsalicylate, Codeine, Kiwi extract, Morphine and related, and Altace [ramipril]  Review of Systems   Review of Systems  Cardiovascular: Positive for chest pain.  All other systems reviewed and are negative.   Physical Exam Updated Vital Signs BP (!) 109/96 (BP Location: Left Arm)   Pulse 60   Temp 98.5 F (36.9 C) (Oral)   Resp 20   Ht 5\' 11"  (1.803 m)   Wt 73.2 kg   SpO2 100%   BMI 22.51 kg/m   Physical Exam Vitals and nursing note reviewed.  Constitutional:      Appearance: He is well-developed.  HENT:     Head: Normocephalic and atraumatic.  Cardiovascular:     Rate and Rhythm: Normal rate and regular rhythm.     Heart sounds: No murmur heard.   Pulmonary:     Effort: Pulmonary effort is normal. No respiratory distress.     Breath sounds: Normal breath sounds.  Abdominal:     Palpations: Abdomen is soft.     Tenderness: There is no abdominal tenderness. There is no guarding or rebound.  Musculoskeletal:        General: No swelling or tenderness.  Skin:    General: Skin is warm and dry.  Neurological:     Mental Status: He is alert and oriented to person,  place, and time.   Psychiatric:        Behavior: Behavior normal.     ED Results / Procedures / Treatments   Labs (all labs ordered are listed, but only abnormal results are displayed) Labs Reviewed  COMPREHENSIVE METABOLIC PANEL - Abnormal; Notable for the following components:      Result Value   Glucose, Bld 338 (*)    Creatinine, Ser 1.51 (*)    Total Protein 5.8 (*)    GFR, Estimated 48 (*)    All other components within normal limits  CBC WITH DIFFERENTIAL/PLATELET - Abnormal; Notable for the following components:   Hemoglobin 12.2 (*)    All other components within normal limits  TROPONIN I (HIGH SENSITIVITY) - Abnormal; Notable for the following components:   Troponin I (High Sensitivity) 24 (*)    All other components within normal limits  TROPONIN I (HIGH SENSITIVITY) - Abnormal; Notable for the following components:   Troponin I (High Sensitivity) 42 (*)    All other components within normal limits    EKG EKG Interpretation  Date/Time:  Friday March 26 2021 16:21:06 EDT Ventricular Rate:  93 PR Interval:  150 QRS Duration: 100 QT Interval:  350 QTC Calculation: 435 R Axis:   -65 Text Interpretation: Normal sinus rhythm Left axis deviation Left ventricular hypertrophy with repolarization abnormality ( R in aVL , Cornell product ) Inferior infarct , age undetermined Abnormal ECG Confirmed by Quintella Reichert [phone]) on 03/26/2021 6:46:33 PM   Radiology DG Chest 2 View  Result Date: 03/26/2021 CLINICAL DATA:  Chest pain EXAM: CHEST - 2 VIEW COMPARISON:  03/17/2020 FINDINGS: Post sternotomy changes. Calcified nodule in the left mid lung. Stable cardiomediastinal silhouette. No pneumothorax. IMPRESSION: No active cardiopulmonary disease. Electronically Signed   By: Donavan Foil M.D.   On: 03/26/2021 17:24    Procedures Procedures   Medications Ordered in ED Medications  aspirin chewable tablet 324 mg (324 mg Oral Not Given 03/26/21 1850)  metoprolol tartrate (LOPRESSOR) tablet  25 mg (has no administration in time range)  aspirin chewable tablet 243 mg (243 mg Oral Given 03/26/21 1842)    ED Course  I have reviewed the triage vital signs and the nursing notes.  Pertinent labs & imaging results that were available during my care of the patient were reviewed by me and considered in my medical decision making (see chart for details).    MDM Rules/Calculators/A&P                         patient with extensive prior cardiac history here for evaluation of exertional chest pain, improved with rest in one sublingual nitroglycerin. He is pain free on ED evaluation. Initial troponin is minimally elevated at 24, repeat 42. EKG is unchanged from priors. He has been pain-free during his ED stay. Discussed with on-call cardiologist, patient does not want to stay in the hospital. Will decrease lisinopril and increases metoprolol as patient has heart rate up to 100 when he has these episodes of chest pain. Plan to discharge home with close cardiology follow-up and return precautions. Presentation is not consistent with ACS, PE, dissection.  Final Clinical Impression(s) / ED Diagnoses Final diagnoses:  Precordial pain    Rx / DC Orders ED Discharge Orders         Ordered    metoprolol tartrate 37.5 MG TABS  2 times daily        03/26/21 2059  Quintella Reichert, MD 03/26/21 2108

## 2021-03-26 NOTE — Discharge Instructions (Addendum)
Decrease your lisinopril to 10 mg daily.  Increase your metoprolol to 37.5 mg twice daily.

## 2021-03-26 NOTE — Progress Notes (Signed)
Discussed with Dr. Ralene Bathe. He has exertional angina with gradually lower exertion threshold for symptoms. Currently symptom free. I do think this is surprising since only POBA was possible for his OM1 and OM2 lesions. It is unlikely that we will have new good PCI targets and he does not want admission. Marginal troponin abnormality and ECG does not support acute coronary insufficiency. Medical therapy should be maximized. He is on a low dose of beta blocker, dose limited by hypotension. Would decrease lisinopril to allow more beta blockade. Continue nitrates. Adding Ranexa would be another next step. Continue dual antiplatelet therapy. Will make every effort to get him seen in clinic next week.  Sanda Klein, MD, [facility] [phone] office [phone] pager

## 2021-03-26 NOTE — ED Notes (Signed)
First nitro given at 42, family member stated second nitro was never given.

## 2021-03-26 NOTE — ED Triage Notes (Signed)
The pt is c/o chest pain for one hour  The pt went away after he took a sl nitro  No pain at present  Some sob

## 2021-03-29 ENCOUNTER — Telehealth: Payer: Self-pay | Admitting: Cardiology

## 2021-03-29 NOTE — Telephone Encounter (Signed)
Spoke to patient's wife.  She states she was calling because  Patient went to ER on 03/26/21.  She states patient has not had any problems since release. Blood pressure yesterday  109/68, 109/77 . Pulse  68,74, 80.   Today's blood pressure is 108/67 pulse 67.  Appointment  schedule to see Meng,PA on 04/19/21 at 8:15 am for follow up.    Patient has schedule 3 month appointment follow up on 05/31/21 with Dr Martinique. This appointment has not been cancelled - (if f/u is needed)   Mrs. Greeno wanted to know if  Dr Martinique was okay with arrangement.  RN informed patient's wife will defer to Dr Martinique to see if further instruction are needed.

## 2021-03-29 NOTE — Telephone Encounter (Signed)
Nevin Bloodgood is calling to schedule C arroll's hospital f/u, due to recently being hospitalized. I advised her that [name] is currently scheduled for his first available due to him currently being booked through August. I then offered to schedule the f/u with a PA, and Nevin Bloodgood requested I send this message for Dr. Doug Sou opinion. She states they made medication changes while [name] was hospitalized and he has not had CP or any other symptoms in the past two days. She is wanting to know if Dr. Martinique feels a sooner f/u than the appt he is currently scheduled for is needed due to this.  Please advise.

## 2021-03-30 ENCOUNTER — Other Ambulatory Visit: Payer: Self-pay

## 2021-03-30 ENCOUNTER — Other Ambulatory Visit: Payer: Self-pay | Admitting: Physician Assistant

## 2021-03-30 ENCOUNTER — Encounter (HOSPITAL_COMMUNITY): Payer: Self-pay | Admitting: Pharmacy Technician

## 2021-03-30 ENCOUNTER — Emergency Department (HOSPITAL_COMMUNITY): Payer: BC Managed Care – PPO

## 2021-03-30 ENCOUNTER — Telehealth: Payer: Self-pay | Admitting: Cardiology

## 2021-03-30 ENCOUNTER — Inpatient Hospital Stay (HOSPITAL_COMMUNITY)
Admission: EM | Admit: 2021-03-30 | Discharge: 2021-04-01 | DRG: 247 | Disposition: A | Discharge status: Home / Self Care | Payer: BC Managed Care – PPO | Attending: Cardiovascular Disease | Admitting: Cardiovascular Disease

## 2021-03-30 DIAGNOSIS — Z885 Allergy status to narcotic agent status: Secondary | ICD-10-CM

## 2021-03-30 DIAGNOSIS — Z86010 Personal history of colonic polyps: Secondary | ICD-10-CM

## 2021-03-30 DIAGNOSIS — N1831 Chronic kidney disease, stage 3a: Secondary | ICD-10-CM | POA: Diagnosis present

## 2021-03-30 DIAGNOSIS — Z7982 Long term (current) use of aspirin: Secondary | ICD-10-CM

## 2021-03-30 DIAGNOSIS — Z955 Presence of coronary angioplasty implant and graft: Secondary | ICD-10-CM

## 2021-03-30 DIAGNOSIS — G3184 Mild cognitive impairment, so stated: Secondary | ICD-10-CM

## 2021-03-30 DIAGNOSIS — I480 Paroxysmal atrial fibrillation: Secondary | ICD-10-CM | POA: Diagnosis present

## 2021-03-30 DIAGNOSIS — K219 Gastro-esophageal reflux disease without esophagitis: Secondary | ICD-10-CM | POA: Diagnosis present

## 2021-03-30 DIAGNOSIS — Z951 Presence of aortocoronary bypass graft: Secondary | ICD-10-CM

## 2021-03-30 DIAGNOSIS — I495 Sick sinus syndrome: Secondary | ICD-10-CM

## 2021-03-30 DIAGNOSIS — I200 Unstable angina: Secondary | ICD-10-CM | POA: Diagnosis present

## 2021-03-30 DIAGNOSIS — E119 Type 2 diabetes mellitus without complications: Secondary | ICD-10-CM | POA: Diagnosis not present

## 2021-03-30 DIAGNOSIS — I2582 Chronic total occlusion of coronary artery: Secondary | ICD-10-CM | POA: Diagnosis present

## 2021-03-30 DIAGNOSIS — I2121 ST elevation (STEMI) myocardial infarction involving left circumflex coronary artery: Secondary | ICD-10-CM | POA: Diagnosis present

## 2021-03-30 DIAGNOSIS — F0390 Unspecified dementia without behavioral disturbance: Secondary | ICD-10-CM | POA: Diagnosis present

## 2021-03-30 DIAGNOSIS — Z8249 Family history of ischemic heart disease and other diseases of the circulatory system: Secondary | ICD-10-CM

## 2021-03-30 DIAGNOSIS — R079 Chest pain, unspecified: Secondary | ICD-10-CM | POA: Diagnosis not present

## 2021-03-30 DIAGNOSIS — I25110 Atherosclerotic heart disease of native coronary artery with unstable angina pectoris: Secondary | ICD-10-CM | POA: Diagnosis not present

## 2021-03-30 DIAGNOSIS — R001 Bradycardia, unspecified: Secondary | ICD-10-CM | POA: Diagnosis present

## 2021-03-30 DIAGNOSIS — Y838 Other surgical procedures as the cause of abnormal reaction of the patient, or of later complication, without mention of misadventure at the time of the procedure: Secondary | ICD-10-CM | POA: Diagnosis present

## 2021-03-30 DIAGNOSIS — E785 Hyperlipidemia, unspecified: Secondary | ICD-10-CM

## 2021-03-30 DIAGNOSIS — E1122 Type 2 diabetes mellitus with diabetic chronic kidney disease: Secondary | ICD-10-CM | POA: Diagnosis present

## 2021-03-30 DIAGNOSIS — I252 Old myocardial infarction: Secondary | ICD-10-CM

## 2021-03-30 DIAGNOSIS — R413 Other amnesia: Secondary | ICD-10-CM

## 2021-03-30 DIAGNOSIS — Z7984 Long term (current) use of oral hypoglycemic drugs: Secondary | ICD-10-CM

## 2021-03-30 DIAGNOSIS — N289 Disorder of kidney and ureter, unspecified: Secondary | ICD-10-CM

## 2021-03-30 DIAGNOSIS — Z79899 Other long term (current) drug therapy: Secondary | ICD-10-CM

## 2021-03-30 DIAGNOSIS — Z888 Allergy status to other drugs, medicaments and biological substances status: Secondary | ICD-10-CM

## 2021-03-30 DIAGNOSIS — I10 Essential (primary) hypertension: Secondary | ICD-10-CM

## 2021-03-30 DIAGNOSIS — I129 Hypertensive chronic kidney disease with stage 1 through stage 4 chronic kidney disease, or unspecified chronic kidney disease: Secondary | ICD-10-CM | POA: Diagnosis present

## 2021-03-30 DIAGNOSIS — T82855A Stenosis of coronary artery stent, initial encounter: Secondary | ICD-10-CM | POA: Diagnosis present

## 2021-03-30 DIAGNOSIS — N400 Enlarged prostate without lower urinary tract symptoms: Secondary | ICD-10-CM

## 2021-03-30 DIAGNOSIS — I25810 Atherosclerosis of coronary artery bypass graft(s) without angina pectoris: Secondary | ICD-10-CM | POA: Diagnosis present

## 2021-03-30 DIAGNOSIS — Z9181 History of falling: Secondary | ICD-10-CM

## 2021-03-30 DIAGNOSIS — Z7902 Long term (current) use of antithrombotics/antiplatelets: Secondary | ICD-10-CM

## 2021-03-30 DIAGNOSIS — Z91018 Allergy to other foods: Secondary | ICD-10-CM

## 2021-03-30 DIAGNOSIS — Z87891 Personal history of nicotine dependence: Secondary | ICD-10-CM

## 2021-03-30 DIAGNOSIS — Z20822 Contact with and (suspected) exposure to covid-19: Secondary | ICD-10-CM | POA: Diagnosis present

## 2021-03-30 DIAGNOSIS — Z9109 Other allergy status, other than to drugs and biological substances: Secondary | ICD-10-CM

## 2021-03-30 LAB — BASIC METABOLIC PANEL
Anion gap: 7 (ref 5–15)
BUN: 20 mg/dL (ref 8–23)
CO2: 28 mmol/L (ref 22–32)
Calcium: 8.7 mg/dL — ABNORMAL LOW (ref 8.9–10.3)
Chloride: 105 mmol/L (ref 98–111)
Creatinine, Ser: 1.26 mg/dL — ABNORMAL HIGH (ref 0.61–1.24)
GFR, Estimated: 59 mL/min — ABNORMAL LOW (ref >60)
Glucose, Bld: 159 mg/dL — ABNORMAL HIGH (ref 70–99)
Potassium: 4.1 mmol/L (ref 3.5–5.1)
Sodium: 140 mmol/L (ref 135–145)

## 2021-03-30 LAB — CBC WITH DIFFERENTIAL/PLATELET
Abs Immature Granulocytes: 0.02 10*3/uL (ref 0.00–0.07)
Basophils Absolute: 0 10*3/uL (ref 0.0–0.1)
Basophils Relative: 1 %
Eosinophils Absolute: 0.1 10*3/uL (ref 0.0–0.5)
Eosinophils Relative: 3 %
HCT: 39.3 % (ref 39.0–52.0)
Hemoglobin: 12.1 g/dL — ABNORMAL LOW (ref 13.0–17.0)
Immature Granulocytes: 1 %
Lymphocytes Relative: 22 %
Lymphs Abs: 1 10*3/uL (ref 0.7–4.0)
MCH: 26.3 pg (ref 26.0–34.0)
MCHC: 30.8 g/dL (ref 30.0–36.0)
MCV: 85.4 fL (ref 80.0–100.0)
Monocytes Absolute: 0.4 10*3/uL (ref 0.1–1.0)
Monocytes Relative: 9 %
Neutro Abs: 2.9 10*3/uL (ref 1.7–7.7)
Neutrophils Relative %: 64 %
Platelets: 148 10*3/uL — ABNORMAL LOW (ref 150–400)
RBC: 4.6 MIL/uL (ref 4.22–5.81)
RDW: 13.7 % (ref 11.5–15.5)
WBC: 4.4 10*3/uL (ref 4.0–10.5)
nRBC: 0 % (ref 0.0–0.2)

## 2021-03-30 LAB — TROPONIN I (HIGH SENSITIVITY)
Troponin I (High Sensitivity): 28 ng/L — ABNORMAL HIGH (ref <18)
Troponin I (High Sensitivity): 30 ng/L — ABNORMAL HIGH (ref <18)

## 2021-03-30 LAB — CBG MONITORING, ED: Glucose-Capillary: 145 mg/dL — ABNORMAL HIGH (ref 70–99)

## 2021-03-30 MED ORDER — PANTOPRAZOLE SODIUM 40 MG PO TBEC
40.0000 mg | DELAYED_RELEASE_TABLET | Freq: Every day | ORAL | Status: DC
  Administered 2021-03-31 – 2021-04-01 (×2): 40 mg via ORAL
  Filled 2021-03-30 (×2): qty 1

## 2021-03-30 MED ORDER — LISINOPRIL 20 MG PO TABS
20.0000 mg | ORAL_TABLET | Freq: Once | ORAL | Status: AC
  Administered 2021-03-30: 20 mg via ORAL
  Filled 2021-03-30: qty 1

## 2021-03-30 MED ORDER — PANTOPRAZOLE SODIUM 20 MG PO TBEC
20.0000 mg | DELAYED_RELEASE_TABLET | Freq: Once | ORAL | Status: DC
  Filled 2021-03-30: qty 1

## 2021-03-30 MED ORDER — ASPIRIN EC 81 MG PO TBEC
81.0000 mg | DELAYED_RELEASE_TABLET | Freq: Every morning | ORAL | Status: DC
  Administered 2021-03-31 – 2021-04-01 (×2): 81 mg via ORAL
  Filled 2021-03-30 (×2): qty 1

## 2021-03-30 MED ORDER — INSULIN ASPART 100 UNIT/ML ~~LOC~~ SOLN
0.0000 [IU] | Freq: Three times a day (TID) | SUBCUTANEOUS | Status: DC
  Administered 2021-03-31: 1 [IU] via SUBCUTANEOUS
  Administered 2021-03-31: 2 [IU] via SUBCUTANEOUS
  Administered 2021-04-01: 1 [IU] via SUBCUTANEOUS

## 2021-03-30 MED ORDER — HEPARIN (PORCINE) 25000 UT/250ML-% IV SOLN
950.0000 [IU]/h | INTRAVENOUS | Status: DC
  Administered 2021-03-30: 950 [IU]/h via INTRAVENOUS
  Filled 2021-03-30 (×3): qty 250

## 2021-03-30 MED ORDER — ACETAMINOPHEN 325 MG PO TABS: 650.0000 mg | ORAL_TABLET | ORAL | Status: DC | PRN

## 2021-03-30 MED ORDER — HEPARIN BOLUS VIA INFUSION
4000.0000 [IU] | Freq: Once | INTRAVENOUS | Status: AC
  Administered 2021-03-30: 4000 [IU] via INTRAVENOUS
  Filled 2021-03-30: qty 4000

## 2021-03-30 MED ORDER — CLOPIDOGREL BISULFATE 75 MG PO TABS
75.0000 mg | ORAL_TABLET | Freq: Once | ORAL | Status: AC
  Administered 2021-03-30: 75 mg via ORAL
  Filled 2021-03-30: qty 1

## 2021-03-30 MED ORDER — ONDANSETRON HCL 4 MG/2ML IJ SOLN: 4.0000 mg | Freq: Four times a day (QID) | INTRAMUSCULAR | Status: DC | PRN

## 2021-03-30 MED ORDER — ISOSORBIDE MONONITRATE ER 30 MG PO TB24
30.0000 mg | ORAL_TABLET | Freq: Once | ORAL | Status: AC
  Administered 2021-03-30: 30 mg via ORAL
  Filled 2021-03-30: qty 1

## 2021-03-30 MED ORDER — ASPIRIN 81 MG PO CHEW
324.0000 mg | CHEWABLE_TABLET | Freq: Once | ORAL | Status: AC
  Administered 2021-03-30: 324 mg via ORAL
  Filled 2021-03-30: qty 4

## 2021-03-30 MED ORDER — METOPROLOL TARTRATE 25 MG PO TABS
25.0000 mg | ORAL_TABLET | Freq: Two times a day (BID) | ORAL | Status: DC
  Administered 2021-03-31 – 2021-04-01 (×2): 25 mg via ORAL
  Filled 2021-03-30 (×3): qty 1

## 2021-03-30 MED ORDER — METOPROLOL TARTRATE 25 MG PO TABS
37.5000 mg | ORAL_TABLET | Freq: Once | ORAL | Status: AC
  Administered 2021-03-30: 37.5 mg via ORAL
  Filled 2021-03-30: qty 2

## 2021-03-30 MED ORDER — TAMSULOSIN HCL 0.4 MG PO CAPS
0.4000 mg | ORAL_CAPSULE | Freq: Every day | ORAL | Status: DC
  Administered 2021-03-30 – 2021-03-31 (×2): 0.4 mg via ORAL
  Filled 2021-03-30 (×2): qty 1

## 2021-03-30 MED ORDER — NITROGLYCERIN 0.4 MG SL SUBL
0.4000 mg | SUBLINGUAL_TABLET | SUBLINGUAL | Status: DC | PRN
  Administered 2021-03-30 – 2021-03-31 (×3): 0.4 mg via SUBLINGUAL
  Filled 2021-03-30 (×3): qty 1

## 2021-03-30 MED ORDER — PANTOPRAZOLE SODIUM 40 MG PO TBEC: 40.0000 mg | DELAYED_RELEASE_TABLET | Freq: Every day | ORAL | Status: DC

## 2021-03-30 MED ORDER — ISOSORBIDE MONONITRATE ER 60 MG PO TB24
60.0000 mg | ORAL_TABLET | Freq: Every day | ORAL | Status: DC
  Administered 2021-03-31 – 2021-04-01 (×2): 60 mg via ORAL
  Filled 2021-03-30 (×2): qty 1

## 2021-03-30 MED ORDER — CLOPIDOGREL BISULFATE 75 MG PO TABS
75.0000 mg | ORAL_TABLET | Freq: Every day | ORAL | Status: DC
  Administered 2021-03-31 – 2021-04-01 (×2): 75 mg via ORAL
  Filled 2021-03-30 (×2): qty 1

## 2021-03-30 NOTE — Telephone Encounter (Signed)
Pt c/o of Chest Pain: STAT if CP now or developed within 24 hours  1. Are you having CP right now? yes  2. Are you experiencing any other symptoms (ex. SOB, nausea, vomiting, sweating)? Back pain   3. How long have you been experiencing CP? A week   4. Is your CP continuous or coming and going? continuous   5. Have you taken Nitroglycerin? Yes ? PT also states he was going threw this last night and it is unbearable.

## 2021-03-30 NOTE — Progress Notes (Signed)
ANTICOAGULATION CONSULT NOTE - Initial Consult  Pharmacy Consult for heparin Indication: chest pain/ACS  Allergies  Allergen Reactions  . Kiwi Extract Anaphylaxis and Other (See Comments)    Makes face red, breaks out into bumps on body, also  . Statins Anaphylaxis and Other (See Comments)    Myalgias, also   . Bismuth Subsalicylate Nausea And Vomiting  . Codeine Nausea And Vomiting  . Morphine And Related Nausea And Vomiting  . Tape Other (See Comments)    "Tape pulls off the skin"- Coban and paper tape are tolerated  . Altace [Ramipril] Rash and Other (See Comments)    All-over body rash    Patient Measurements:   Heparin Dosing Weight: 73 kg   Vital Signs: Temp: 98.6 F (37 C) (04/26 1016) Temp Source: Oral (04/26 1016) BP: 129/69 (04/26 1515) Pulse Rate: 50 (04/26 1615)  Labs: Recent Labs    03/30/21 1110 03/30/21 1220  HGB 12.1*  --   HCT 39.3  --   PLT 148*  --   CREATININE 1.26*  --   TROPONINIHS 28* 30*    Estimated Creatinine Clearance: 52.4 mL/min (A) (by C-G formula based on SCr of 1.26 mg/dL (H)).   Medical History: Past Medical History:  Diagnosis Date  . Arthritis   . Arthritis   . Atrial fibrillation ([place])   . BPH (benign prostatic hypertrophy)   . CAD (coronary artery disease)    stents placed  . Diabetes mellitus   . Diverticulosis   . Dyspnea    when exerting self  . Dysrhythmia    "extra beats"  . Esophageal stricture   . GERD (gastroesophageal reflux disease)   . Headache(784.0)   . Hemorrhoids   . Hernia, inguinal, right   . Hiatal hernia   . Hyperlipidemia   . Hypertension   . Pneumonia   . Staph infection    Left ankle  . 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 Urinary hesitancy     Medications:  (Not in a hospital admission)   Assessment: 40 YOM with h/o CABG, Afib and CAD here with chest pain. Pharmacy consulted to start IV heparin for ACS with plans for [facility] tomorrow.   Hgb and Plt low. Scr 1.26   Goal of  Therapy:  Heparin level 0.3-0.7 units/ml Monitor platelets by anticoagulation protocol: Yes   Plan:  -Heparin 4000 units IV bolus followed by heparin infusion at 950 units/hr.  -F/u 8 hr HL -Monitor daily HL, CBC and s/s of bleeding   Albertina Parr, PharmD., BCPS, BCCCP Clinical Pharmacist Please refer to [facility] for unit-specific pharmacist

## 2021-03-30 NOTE — H&P (Addendum)
Cardiology Admission History and Physical:   Patient ID: [name] MRN: [id number]; DOB: February 25, 1946   Admission date: 03/30/2021  PCP:  Ginger Organ., MD   [facility]  Cardiologist:  Peter Martinique, MD  Advanced Practice Provider:  No care team member to display Electrophysiologist:  None    Chief Complaint:  Chest pain  Patient Profile:   [name] is a 75 y.o. male with PMH of CAD, paroxsymal A fib (post-op ofr CABG without recurrence), HTN, HLD, type 2 DM, GERD, who presented to ER with c/o of chest pain. Cardiology is asked to see the patient by Dr. Regenia Skeeter.   History of Present Illness:   [name] follows Dr Martinique outpatient primarily.   12/2019 he had chest pain and abnormal stress test. Cath showed severe 3 vessel CAD. He underwent CABG by Dr Prescott Gum on 12/20/19 including LIMA to LAD, SVG to diagonal, SVG to OM1 and SVG to PDA.   He had STEMI 03/16/20 where he was admitted here. Cath showed occluded SVG to OM2and RCA. Patent LIMA to LAD and SVG to diagonal. OM 2 has two separate high grade stenosis and he underwent successful PCI of OM2 with DES x 2 in proximal and distal vessel. Also underwent successful PCI of the distal LCx(90% stenosis)with DES x 1. He did have a 70% proximal RCA proximally and a diffusely diseased PLA branch which fills by left-to-right collaterals medically managed. He was recommended for DAPT with aspirin and Plavix. He was referred to lipid clinic for PSK9-1 due to LDL 137. Amiodarone and anticoagulation were stopped given resolved remote post-op A fib. He was started on Praluent in 5.2021 and completed cardiac rehab.   He had complained some chest pain with lightheadedness on in 12/08/2020 office visit with Dr Martinique, his beta blocker was reduced and Imdur was up to 30mg . On follow up appointment 02/01/21 he had reported more frequent chest pain for 2 weeks and had more stress from her ill dog. He was recommend  cardiac cath with possible PCI.   He underwent heart cath 02/09/21 which showed 3 vessel obstructive CAD, patent LIMA to LAD, patent SVG to diagonal, patent stent in distal LCx, known occlusion of SVG to OM2 and SVG to PDA, restenosis of the stents in the proximal and distal OM2, normal LV function and LVEDP. He was treated with successful scoring balloon angioplasty of the stents in the proximal and distal OM2 and received PCI of the proximal RCA with DES x 1. He was recommended DAPT indefinitely and aggressive medical therapy.  He visited ER on 03/26/21 for exertional angina and gradually lower exertion threshold. He had marginal troponin elevation 24>42 and ECG without acute changes.  Dr Sallyanne Kuster was curbside by ER provider, felt his symptoms are due to his OM1 and OM2 lesions, unlikely new good PCI targets, and he was recommended maximize medical therapy, where his lisinopril was reduced to 1/2 dose, to allow more BP room for increasing Metoprolol to 37.5mg  BID, with plan to possible adding Ranexa later.   He returned to ER today with  c/o of left side chest pain radiating to left back, started last night and kept him from sleeping. He suffers memory loss and is limited in history telling. His wife is at bedside, reports patient had overall improvement of angina symptoms and activity tolerance since 02/09/21 cath. He was able to mow the 2 acre lawn without problems. Over the past 2 weeks, he has  been having intermittent chest pain. Pain initially was triggered by activities. It's located on the left sided of chest. Resting relieved the pain. Over the past 2 days, he noted increased frequency of chest pain, that it would occur average 3 times daily. He was not able to mow the grass yesterday due to chest pain.  He had chest pain last night again while he was sleeping, pain is sharp and severe, "15/10" as he rates. He took [employer] and not sure if it helped. Pain is resolved sometime this morning. He has no chest  pain at this time. He reports associated exertional SOB, lightheadedness  without syncope. His wife also reports that his HR was fast on Friday 03/26/21 up to 105, and this was abnormal for him as his HR usually runs at 37s. He denied any heart palpitation, dizziness, syncope, weight gain, orthopnea, or PND. He endorse chronic intermittent swelling of right leg since his previous falls. He is suppose to neurology for progressively worsening memory.   Diagnostic here showed Hs trop 28 >30. BMP with Cr 1.26 and GFR 59. CBC with Hgb 12.4 and PLT 148k. CXR showed no acute finding. EKG showed sinus bradycardia, rate of 50s, ST-T change andTWI noted of inferolateral leads comparing to 4/22 EKG concerning for ischemia.      Past Medical History:  Diagnosis Date  . Arthritis   . Arthritis   . Atrial fibrillation ([place])   . BPH (benign prostatic hypertrophy)   . CAD (coronary artery disease)    stents placed  . Diabetes mellitus   . Diverticulosis   . Dyspnea    when exerting self  . Dysrhythmia    "extra beats"  . Esophageal stricture   . GERD (gastroesophageal reflux disease)   . Headache(784.0)   . Hemorrhoids   . Hernia, inguinal, right   . Hiatal hernia   . Hyperlipidemia   . Hypertension   . Pneumonia   . Staph infection    Left ankle  . 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 Urinary hesitancy     Past Surgical History:  Procedure Laterality Date  . ANKLE ARTHROPLASTY  2006  . CARDIOVASCULAR STRESS TEST  01/22/2009   EF 51%, NO ISCHEMIA  . CARDIOVASCULAR STRESS TEST    . COLONOSCOPY    . CORONARY ANGIOPLASTY WITH STENT PLACEMENT  03/2005  . CORONARY ARTERY BYPASS GRAFT N/A 12/20/2019   Procedure: CORONARY ARTERY BYPASS GRAFTING (CABG) times four, using left internal mammary artery and left greater saphenous vein;  Surgeon: Ivin Poot, MD;  Location: [facility];  Service: Open Heart Surgery;  Laterality: N/A;  . CORONARY BALLOON ANGIOPLASTY N/A 02/09/2021   Procedure: CORONARY  BALLOON ANGIOPLASTY;  Surgeon: Martinique, Peter M, MD;  Location: [facility];  Service: Cardiovascular;  Laterality: N/A;  . CORONARY STENT INTERVENTION N/A 03/16/2020   Procedure: CORONARY STENT INTERVENTION;  Surgeon: Martinique, Peter M, MD;  Location: [facility];  Service: Cardiovascular;  Laterality: N/A;  . CORONARY STENT INTERVENTION N/A 02/09/2021   Procedure: CORONARY STENT INTERVENTION;  Surgeon: Martinique, Peter M, MD;  Location: [facility];  Service: Cardiovascular;  Laterality: N/A;  . CORONARY STENT PLACEMENT  03/10/2005   SUCCESSFUL STENTING OF THE MID AND CRUX OF THE RCA  . CORONARY/GRAFT ACUTE MI REVASCULARIZATION N/A 03/16/2020   Procedure: Coronary/Graft Acute MI Revascularization;  Surgeon: Martinique, Peter M, MD;  Location: [facility];  Service: Cardiovascular;  Laterality: N/A;  . ESOPHAGEAL DILATION  multiple procedures  . HERNIA REPAIR  [id number]   umbilical hernia  . INGUINAL HERNIA REPAIR  11/28/2011   Procedure: HERNIA REPAIR INGUINAL ADULT;  Surgeon: Imogene Burn. Georgette Dover, MD;  Location: [facility];  Service: General;  Laterality: Right;  right inguinal hernia repair with mesh  . KNEE SURGERY     Right  . LEFT HEART CATH AND CORONARY ANGIOGRAPHY N/A 12/03/2019   Procedure: LEFT HEART CATH AND CORONARY ANGIOGRAPHY;  Surgeon: Jettie Booze, MD;  Location: [facility];  Service: Cardiovascular;  Laterality: N/A;  . LEFT HEART CATH AND CORS/GRAFTS ANGIOGRAPHY N/A 03/16/2020   Procedure: LEFT HEART CATH AND CORS/GRAFTS ANGIOGRAPHY;  Surgeon: Martinique, Peter M, MD;  Location: [facility];  Service: Cardiovascular;  Laterality: N/A;  . LEFT HEART CATH AND CORS/GRAFTS ANGIOGRAPHY N/A 02/09/2021   Procedure: LEFT HEART CATH AND CORS/GRAFTS ANGIOGRAPHY;  Surgeon: Martinique, Peter M, MD;  Location: [facility];  Service: Cardiovascular;  Laterality: N/A;  . POLYPECTOMY    . TEE WITHOUT CARDIOVERSION N/A 12/20/2019   Procedure: TRANSESOPHAGEAL  ECHOCARDIOGRAM (TEE);  Surgeon: Prescott Gum, Collier Salina, MD;  Location: [facility];  Service: Open Heart Surgery;  Laterality: N/A;  . TONSILLECTOMY  1958  . UMBILICAL HERNIA REPAIR  11/2006  . US ECHOCARDIOGRAPHY       Medications Prior to Admission: Prior to Admission medications   Medication Sig Start Date End Date Taking? Authorizing Provider  acetaminophen (TYLENOL) 650 MG CR tablet Take 1,300 mg by mouth every 8 (eight) hours as needed for pain.    [provider]  aspirin EC 81 MG tablet Take 1 tablet (81 mg total) by mouth daily. 11/22/19   Martinique, Peter M, MD  B Complex-C (B-COMPLEX WITH VITAMIN C) tablet Take 1 tablet by mouth daily with breakfast.     [provider]  cetirizine (ZYRTEC) 10 MG tablet Take 10 mg by mouth daily.    [provider]  clopidogrel (PLAVIX) 75 MG tablet Take 1 tablet (75 mg total) by mouth daily with breakfast. 02/09/21   Martinique, Peter M, MD  clopidogrel (PLAVIX) 75 MG tablet TAKE 1 TABLET (75 MG TOTAL) BY MOUTH DAILY WITH BREAKFAST. 02/09/21 02/09/22  Martinique, Peter M, MD  empagliflozin (JARDIANCE) 25 MG TABS tablet Take 1 tablet (25 mg total) by mouth daily before breakfast. 12/08/20   Martinique, Peter M, MD  fluticasone [facility]) 50 MCG/ACT nasal spray Place 2 sprays into both nostrils daily as needed for allergies.    [provider]  isosorbide mononitrate (IMDUR) 30 MG 24 hr tablet Take 1 tablet (30 mg total) by mouth daily. 02/25/21   Martinique, Peter M, MD  lansoprazole (PREVACID) 15 MG capsule Take 15 mg by mouth daily.    [provider]  lisinopril (ZESTRIL) 20 MG tablet TAKE 1 TABLET BY MOUTH DAILY 08/12/20   Martinique, Peter M, MD  metoprolol tartrate 37.5 MG TABS Take 37.5 mg by mouth 2 (two) times daily. 03/26/21   Quintella Reichert, MD  Multiple Minerals-Vitamins (CALCIUM-MAGNESIUM-ZINC-D3) TABS Take 1 tablet by mouth daily with lunch.     [provider]  nitroGLYCERIN (NITROSTAT) 0.4 MG SL tablet Place 1 tablet (0.4 mg  total) under the tongue every 5 (five) minutes as needed. 12/08/20   Martinique, Peter M, MD  OneTouch Delica Lancets [id number] MISC 1 each by Other route daily. 02/14/20   [provider]  [facility] VERIO test strip 1 each by Other route daily. 02/13/20   [provider]  PRALUENT 150 MG/ML SOAJ INJECT 150MG  INTO THE SKIN EVERY 14 DAYS 03/25/21   Martinique, Peter M, MD  Tamsulosin HCl (FLOMAX) 0.4 MG CAPS Take 0.4 mg by mouth daily after supper.    [provider]     Allergies:    Allergies  Allergen Reactions  . Kiwi Extract Anaphylaxis and Other (See Comments)    Makes face red, breaks out into bumps on body, also  . Statins Anaphylaxis and Other (See Comments)    Myalgias, also   . Bismuth Subsalicylate Nausea And Vomiting  . Codeine Nausea And Vomiting  . Morphine And Related Nausea And Vomiting  . Tape Other (See Comments)    "Tape pulls off the skin"- Coban and paper tape are tolerated  . Altace [Ramipril] Rash and Other (See Comments)    All-over body rash    Social History:   Social History   Socioeconomic History  . Marital status: Married    Spouse name: Not on file  . Number of children: 3  . Years of education: Not on file  . Highest education level: Not on file  Occupational History  . Occupation: Retired  Tobacco Use  . Smoking status: Former Smoker    Packs/day: 1.00    Years: 22.00    Pack years: 22.00    Types: Cigarettes, Pipe    Quit date: 12/17/2014    Years since quitting: 6.2  . Smokeless tobacco: Never Used  Substance and Sexual Activity  . Alcohol use: No    Alcohol/week: 0.0 standard drinks  . Drug use: No  . Sexual activity: Not on file  Other Topics Concern  . Not on file  Social History Narrative  . Not on file   Social Determinants of Health   Financial Resource Strain: Not on file  Food Insecurity: Not on file  Transportation Needs: Not on file  Physical Activity: Not on file  Stress: Not on file  Social Connections:  Not on file  Intimate Partner Violence: Not on file    Family History:   The patient's family history includes Cancer in his paternal grandmother; Heart disease in his brother, maternal grandfather, mother, and sister; Ovarian cancer in his paternal grandmother. There is no history of Colon cancer, Esophageal cancer, or Stomach cancer.    ROS:   Constitutional: Denied fever, chills, malaise, night sweats Eyes: Denied vision change or loss Ears/Nose/Mouth/Throat: Denied ear ache, sore throat, coughing, sinus pain Cardiovascular: see HPI  Respiratory: see HPI  Gastrointestinal: Denied nausea, vomiting, abdominal pain, diarrhea Genital/Urinary: Denied dysuria, hematuria, urinary frequency/urgency Musculoskeletal: Denied muscle ache, joint pain, weakness Skin: Denied rash, wound Neuro: Denied headache, dizziness, syncope Psych: Denied history of depression/anxiety  Endocrine: history of diabetes   Physical Exam/Data:   Vitals:   03/30/21 1445 03/30/21 1500 03/30/21 1515 03/30/21 1615  BP: 110/70 121/72 129/69   Pulse: (!) 51 (!) 44 (!) 48 (!) 50  Resp: 18 15 14 18   Temp:      TempSrc:      SpO2: 100% 99% 100% 98%   No intake or output data in the 24 hours ending 03/30/21 1727 Last 3 Weights 03/26/2021 02/25/2021 02/09/2021  Weight (lbs) 161 lb 6 oz 161 lb 6.4 oz 162 lb 0.6 oz  Weight (kg) 73.2 kg 73.211 kg 73.5 kg     There is no height or weight on file to calculate BMI.   Vitals:  Vitals:   03/30/21 1515 03/30/21 1615  BP: 129/69  Pulse: (!) 48 (!) 50  Resp: 14 18  Temp:    SpO2: 100% 98%   General Appearance: In no apparent distress, laying in bed HEENT: Normocephalic, atraumatic. EOMs intact.   Neck: Supple, trachea midline, 7-8cm JVDs Cardiovascular: Regular rhythm, bradycardia, normal S1-S2,  no murmur/rub/gallop Respiratory: Resting breathing unlabored, lungs sounds clear to auscultation bilaterally, no use of accessory muscles. On room air.  No wheezes, rales or  rhonchi.   Gastrointestinal: Bowel sounds positive, abdomen soft, non-tender, non-distended. No mass or organomegaly.  Extremities: Able to move all extremities in bed without difficulty, trace edema; Radial pulse 2+ bilaterally  Genitourinary: Urine clear yellow in urinal  Musculoskeletal: Normal muscle bulk and tone, muscle strength 5/5 throughout, no limited range of motion Skin: Intact, warm, dry. No rashes or petechiae noted in exposed areas.  Neurologic: Alert, oriented to person, place and time. Fluent speech, mild memory loss, no gross focal neuro deficit Psychiatric: Normal affect. Mood is appropriate.     EKG:  EKG showed sinus bradycardia with HR 50s, ST-T change and TWI noted of inferolateral leads concerning for ischemia when compared to EKG on 03/26/21, although some are similar findings to EKG on 02/09/21.   Telemetry showed sinus bradycardia with HR 40-50s.     Relevant CV Studies:    LHC on 02/09/2021:   Mid LAD-1 lesion is 80% stenosed.  Prox LAD lesion is 25% stenosed.  Mid LAD-2 lesion is 100% stenosed.  Previously placed Dist Cx drug eluting stent is widely patent.  RPAV lesion is 99% stenosed.  Mid RCA lesion is 30% stenosed.  Dist RCA lesion is 30% stenosed.  Prox RCA lesion is 80% stenosed.  A drug-eluting stent was successfully placed using a STENT RESOLUTE ONYX 3.0X12.  Post intervention, there is a 0% residual stenosis.  LIMA and is normal in caliber.  The graft exhibits no disease.  SVG and is normal in caliber.  The graft exhibits no disease.  SVG.  Origin lesion is 100% stenosed.  SVG.  Origin to Prox Graft lesion is 100% stenosed.  2nd Mrg-1 lesion is 90% stenosed.  Post intervention, there is a 0% residual stenosis.  Scoring balloon angioplasty was performed using a BALLOON WOLVERINE 2.50X10.  2nd Mrg-2 lesion is 90% stenosed.  Scoring balloon angioplasty was performed using a BALLOON WOLVERINE 2.50X10.  Post intervention,  there is a 35% residual stenosis.  The left ventricular systolic function is normal.  LV end diastolic pressure is normal.  The left ventricular ejection fraction is 55-65% by visual estimate.   1. 3 vessel obstructive CAD 2. Patent LIMA to the LAD 3. Patent SVG to the diagonal. 4. Known occlusion of SVG to OM2 and SVG to PDA 5. Restenosis of the stents in the proximal and distal OM2 6. Patent stent in the distal LCx. 7. Normal LV function 8. Normal LVEDP 9. Successful scoring balloon angioplasty of the stents in the proximal and distal OM2 10 Successful PCI of the proximal RCA with DES x 1.  Plan: DAPT indefinitely. Continue aggressive medical therapy. Patient is a candidate for same day DC.    Echo from 12/18/2019:  1. Left ventricular ejection fraction, by visual estimation, is 55 to  60%. The left ventricle has normal function. There is no left ventricular  hypertrophy.  2. Left ventricular diastolic parameters are indeterminate.  3. The left ventricle has no regional wall motion abnormalities.  4. Global right ventricle has normal systolic function.The right  ventricular size is normal. No increase  in right ventricular wall  thickness.  5. Left atrial size was mildly dilated.  6. Right atrial size was mildly dilated.  7. The mitral valve is normal in structure. No evidence of mitral valve  regurgitation. No evidence of mitral stenosis.  8. The tricuspid valve is normal in structure.  9. The aortic valve is normal in structure. Aortic valve regurgitation is  not visualized. No evidence of aortic valve sclerosis or stenosis.  10. The pulmonic valve was normal in structure. Pulmonic valve  regurgitation is not visualized.  11. The atrial septum is grossly normal.   Laboratory Data:  High Sensitivity Troponin:   Recent Labs  Lab 03/26/21 1621 03/26/21 1821 03/30/21 1110 03/30/21 1220  TROPONINIHS 24* 42* 28* 30*      Chemistry Recent Labs  Lab  03/26/21 1621 03/30/21 1110  NA 139 140  K 4.1 4.1  CL 104 105  CO2 27 28  GLUCOSE 338* 159*  BUN 23 20  CREATININE 1.51* 1.26*  CALCIUM 8.9 8.7*  GFRNONAA 48* 59*  ANIONGAP 8 7    Recent Labs  Lab 03/26/21 1621  PROT 5.8*  ALBUMIN 3.6  AST 17  ALT 12  ALKPHOS 53  BILITOT 0.6   Hematology Recent Labs  Lab 03/26/21 1621 03/30/21 1110  WBC 4.9 4.4  RBC 4.64 4.60  HGB 12.2* 12.1*  HCT 39.9 39.3  MCV 86.0 85.4  MCH 26.3 26.3  MCHC 30.6 30.8  RDW 14.0 13.7  PLT 171 148*   BNPNo results for input(s): BNP, PROBNP in the last 168 hours.  DDimer No results for input(s): DDIMER in the last 168 hours.   Radiology/Studies:  DG Chest 2 View  Result Date: 03/30/2021 CLINICAL DATA:  Chest pain and shoulder pain EXAM: CHEST - 2 VIEW COMPARISON:  03/26/2021 FINDINGS: Previous median sternotomy and CABG procedure. Stable cardiomediastinal contours. No pleural effusion or edema. No airspace densities. Spondylosis noted within the thoracic spine. IMPRESSION: No active cardiopulmonary abnormalities. Electronically Signed   By: Kerby Moors M.D.   On: 03/30/2021 11:04     Assessment and Plan:   Unstable angina  CAD s/p CABG 12/20/19, PCI of OM2 and LCx 03/25/20, PCI of RCA 02/09/2021 - presented 2 weeks progressively worsening of chest pain triggered by exertion now at rest, with SOB, lightheadedness, activity intolerance - Hs trop flat 28 >30 - EKG with inferolateral changes concerning for ischemia  - continue medical therapy include DAPT (ASA and plavix), will reduce Metoprolol from 37.5mg  BID to 25mg  BID due to bradycardia, will increase Imdur from 30 to 60mg  daily for angina, and continue statin - hold lisinopril before cath - start heparin gtt  - NPO after MN, plan for left cardiac cath tomorrow  Shared Decision Making/Informed Consent{  The risks [stroke (1 in 1000), death (1 in 1000), kidney failure [usually temporary] (1 in 500), bleeding (1 in 200), allergic reaction  [possibly serious] (1 in 200)], benefits (diagnostic support and management of coronary artery disease) and alternatives of a cardiac catheterization were discussed in detail with Mr. Spofford and his wife,  he is willing to proceed.  Sinus bradycardia  - baseline HR at 50-55s per wife reports, currently 40-50s, asymptomatic   - possibly due to recent metoprolol up-tirtiation , will reduce back to 25mg  BID  - continue telemetry monitor   CKD stage IIIa - renal index near baseline - gentle IVF before cath tomorrow - trend BMP daily  Type 2 DM - A1C 7.2% on 03/17/21 -  on jardiance at home, will hold  - [facility] diet, fair control  - start Novolog insulin SSI AC  HTN - BP low normal, on lisinopril 20mg , metoprolol 37.5mg  BID, and Imdur 30mg  at home  - will reduce metoprolol at 25mg  BID, increase Imdur to 60mg  daily , hold lisinopril   HLD - LDL 137 from 03/16/20 - will repeat lipid panel tomorrow - continue statin   GERD - substituted with Protonix 40 mg daily   BPH - Home med flomax resumed     Risk Assessment/Risk Scores:   TIMI Risk Score for Unstable Angina or Non-ST Elevation MI:   The patient's TIMI risk score is 6, which indicates a 41% risk of all cause mortality, new or recurrent myocardial infarction or need for urgent revascularization in the next 14 days.{   Severity of Illness: The appropriate patient status for this patient is OBSERVATION. Observation status is judged to be reasonable and necessary in order to provide the required intensity of service to ensure the patient's safety. The patient's presenting symptoms, physical exam findings, and initial radiographic and laboratory data in the context of their medical condition is felt to place them at decreased risk for further clinical deterioration. Furthermore, it is anticipated that the patient will be medically stable for discharge from the hospital within 2 midnights of admission. The following factors support the  patient status of observation.   " The patient's presenting symptoms include chest pain. " The physical exam findings include chest pain " The initial radiographic and laboratory data are unremarkable troponin     For questions or updates, please contact [place] Please consult [email or web address] for contact info under     Signed, Margie Billet, NP  03/30/2021 5:27 PM   Patient seen, examined. Available data reviewed. Agree with findings, assessment, and plan as outlined by Margie Billet, NP.  On my exam, the patient is alert, oriented, in no acute distress.  HEENT is normal, lungs are clear to auscultation bilaterally, JVP is normal, carotid upstrokes are normal without bruits, heart is regular rate and rhythm no murmur gallop, abdomen is soft and nontender, extremities have no edema, skin is warm and dry with no rash.  EKG shows marked sinus bradycardia with ST/T wave abnormality consider inferolateral ischemia.  ST changes are new from his most recent EKG for comparison, however some of the EKG changes are seen on past tracings.  Troponin is mildly elevated at 28 and 30.  History is obtained from the patient and his wife.  His wife is a better historian.  The patient underwent recent cardiac catheterization and PCI February 09, 2021.  Catheterization films were personally reviewed.  At that time he underwent scoring balloon angioplasty of stents in the second obtuse marginal branch as well as stenting of severe stenosis in the proximal right coronary artery.  The patient initially did well, but has developed recurrent symptoms of angina over the last week.  He was evaluated in the emergency room late last week for progressive angina.  Medications were adjusted.  He took it easy over the weekend but yesterday when he went out to mow the lawn he developed severe angina within the first minute of activity.  He stopped in chest discomfort resolved.  He tried to mow the lawn again and he again was forced to stop,  this time going inside to lay down.  Last night he had resting chest pain and states that "I felt like I was going to die."  His  wife states that these were the worst symptoms he has had since his heart attack 1 year ago.  I reviewed further diagnostic and treatment options with the patient and his wife today.  There may not be any further targets for intervention.  However, in light of his clearly progressive symptoms in a crescendo pattern, I would favor relook heart catheterization tomorrow.  We will start him on heparin tonight.  Reviewed risks, indications, and alternatives of cardiac catheterization and possible PCI with the patient and his wife today. Risks include but are not limited to bleeding, infection, vascular injury, stroke, myocardial infection, arrhythmia, kidney injury, radiation-related injury in the case of prolonged fluoroscopy use, emergency cardiac surgery, and death. The patient understands the risks of serious complication is 1-2 in 1610 with diagnostic cardiac cath and 1-2% or less with angioplasty/stenting. They understand and provide informed consent. In addition, will increase isosorbide to 60 mg daily and reduce metoprolol because of bradycardia. Will hold lisinopril for cath tomorrow and may continue to hold in order to allow for adequate BP for anti-anginal therapy.  Sherren Mocha, M.D. 03/30/2021 6:12 PM

## 2021-03-30 NOTE — ED Provider Notes (Signed)
[facility] EMERGENCY DEPARTMENT Provider Note   CSN: [id number] Arrival date & time: 03/30/21  2376     History Chief Complaint  Patient presents with  . Chest Pain    [name] is a 75 y.o. male.  HPI 75 year old male presents with chest pain.  He states it originally started yesterday afternoon while he was trying to mow the lawn.  He tried to mow with 3 straight times but each time he had to sit down and stop after short amount because of chest pressure.  It would resolve with rest.  Then as he was going to bed last night he developed recurrent chest pain as well as intermittent left posterior shoulder pain.  This pain lasted all night until this morning after he got up.  He was able to sleep here and there but every time he was awake he had pain.  He tried 1 nitro with no relief.  Just prior to coming here, his pain did seem to resolve.  However he is seeking medical care for why he was having such chest pain.  Has not taken his morning meds yet. No dyspnea, diaphoresis or vomiting.    Past Medical History:  Diagnosis Date  . Arthritis   . Arthritis   . Atrial fibrillation ([place])   . BPH (benign prostatic hypertrophy)   . CAD (coronary artery disease)    stents placed  . Diabetes mellitus   . Diverticulosis   . Dyspnea    when exerting self  . Dysrhythmia    "extra beats"  . Esophageal stricture   . GERD (gastroesophageal reflux disease)   . Headache(784.0)   . Hemorrhoids   . Hernia, inguinal, right   . Hiatal hernia   . Hyperlipidemia   . Hypertension   . Pneumonia   . Staph infection    Left ankle  . 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 Urinary hesitancy     Patient Active Problem List   Diagnosis Date Noted  . Encounter for post surgical wound check 04/08/2020  . 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 CAD (coronary artery disease) of  bypass graft 03/16/20 03/18/2020  . STEMI involving left circumflex coronary artery ([place]) 03/16/2020  . Postop check 01/22/2020  . 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 Unstable angina ([place]) 12/04/2019  . Abnormal nuclear stress test   . PAF (paroxysmal atrial fibrillation) ([place]) 04/21/2016  . Pre-syncope 04/10/2016  . Diabetes ([place]) 04/10/2016  . Hyperlipidemia   . Right inguinal hernia 10/18/2011  . Personal history of colonic polyps 06/13/2011  . Guaiac positive stools 06/13/2011  . GERD (gastroesophageal reflux disease) 06/13/2011  . ARTHRITIS 03/03/2009  . DIABETES MELLITUS 03/02/2009  . Essential hypertension 03/02/2009  . Coronary atherosclerosis 03/02/2009  . HEMORRHOIDS 03/02/2009  . ESOPHAGEAL STRICTURE 03/02/2009  . GERD 03/02/2009  . HIATAL HERNIA 03/02/2009  . POLYP, GALLBLADDER 03/02/2009    Past Surgical History:  Procedure Laterality Date  . ANKLE ARTHROPLASTY  2006  . CARDIOVASCULAR STRESS TEST  01/22/2009   EF 51%, NO ISCHEMIA  . CARDIOVASCULAR STRESS TEST    . COLONOSCOPY    . CORONARY ANGIOPLASTY WITH STENT PLACEMENT  03/2005  . CORONARY ARTERY BYPASS GRAFT N/A 12/20/2019   Procedure: CORONARY ARTERY BYPASS GRAFTING (CABG) times four, using left internal mammary artery and left greater saphenous vein;  Surgeon: Ivin Poot,  MD;  Location: [facility] OR;  Service: Open Heart Surgery;  Laterality: N/A;  . CORONARY BALLOON ANGIOPLASTY N/A 02/09/2021   Procedure: CORONARY BALLOON ANGIOPLASTY;  Surgeon: Martinique, Peter M, MD;  Location: [place] [facility];  Service: Cardiovascular;  Laterality: N/A;  . CORONARY STENT INTERVENTION N/A 03/16/2020   Procedure: CORONARY STENT INTERVENTION;  Surgeon: Martinique, Peter M, MD;  Location: [place] [facility];  Service: Cardiovascular;  Laterality: N/A;  . CORONARY STENT INTERVENTION N/A 02/09/2021   Procedure: CORONARY STENT INTERVENTION;  Surgeon: Martinique, Peter M, MD;  Location: [place] [facility];  Service: Cardiovascular;  Laterality:  N/A;  . CORONARY STENT PLACEMENT  03/10/2005   SUCCESSFUL STENTING OF THE MID AND CRUX OF THE RCA  . CORONARY/GRAFT ACUTE MI REVASCULARIZATION N/A 03/16/2020   Procedure: Coronary/Graft Acute MI Revascularization;  Surgeon: Martinique, Peter M, MD;  Location: [place] [facility];  Service: Cardiovascular;  Laterality: N/A;  . ESOPHAGEAL DILATION     multiple procedures  . HERNIA REPAIR  [id number]   umbilical hernia  . INGUINAL HERNIA REPAIR  11/28/2011   Procedure: HERNIA REPAIR INGUINAL ADULT;  Surgeon: Imogene Burn. Georgette Dover, MD;  Location: [place] OR;  Service: General;  Laterality: Right;  right inguinal hernia repair with mesh  . KNEE SURGERY     Right  . LEFT HEART CATH AND CORONARY ANGIOGRAPHY N/A 12/03/2019   Procedure: LEFT HEART CATH AND CORONARY ANGIOGRAPHY;  Surgeon: Jettie Booze, MD;  Location: [place] [facility];  Service: Cardiovascular;  Laterality: N/A;  . LEFT HEART CATH AND CORS/GRAFTS ANGIOGRAPHY N/A 03/16/2020   Procedure: LEFT HEART CATH AND CORS/GRAFTS ANGIOGRAPHY;  Surgeon: Martinique, Peter M, MD;  Location: [place] [facility];  Service: Cardiovascular;  Laterality: N/A;  . LEFT HEART CATH AND CORS/GRAFTS ANGIOGRAPHY N/A 02/09/2021   Procedure: LEFT HEART CATH AND CORS/GRAFTS ANGIOGRAPHY;  Surgeon: Martinique, Peter M, MD;  Location: [place] [facility];  Service: Cardiovascular;  Laterality: N/A;  . POLYPECTOMY    . TEE WITHOUT CARDIOVERSION N/A 12/20/2019   Procedure: TRANSESOPHAGEAL ECHOCARDIOGRAM (TEE);  Surgeon: Prescott Gum, Collier Salina, MD;  Location: [place];  Service: Open Heart Surgery;  Laterality: N/A;  . TONSILLECTOMY  1958  . UMBILICAL HERNIA REPAIR  11/2006  . US ECHOCARDIOGRAPHY         Family History  Problem Relation Age of Onset  . Heart disease Mother   . Heart disease Sister   . Heart disease Brother   . Heart disease Maternal Grandfather   . Ovarian cancer Paternal Grandmother   . Cancer Paternal Grandmother        cervical  . Colon cancer Neg Hx   . Esophageal  cancer Neg Hx   . Stomach cancer Neg Hx     Social History   Tobacco Use  . Smoking status: Former Smoker    Packs/day: 1.00    Years: 22.00    Pack years: 22.00    Types: Cigarettes, Pipe    Quit date: 12/17/2014    Years since quitting: 6.2  . Smokeless tobacco: Never Used  Substance Use Topics  . Alcohol use: No    Alcohol/week: 0.0 standard drinks  . Drug use: No    Home Medications Prior to Admission medications   Medication Sig Start Date End Date Taking? Authorizing Provider  acetaminophen (TYLENOL) 650 MG CR tablet Take 1,300 mg by mouth every 8 (eight) hours as needed for pain.    [provider]  aspirin EC 81 MG tablet  Take 1 tablet (81 mg total) by mouth daily. 11/22/19   Martinique, Peter M, MD  B Complex-C (B-COMPLEX WITH VITAMIN C) tablet Take 1 tablet by mouth daily with breakfast.     [provider]  cetirizine (ZYRTEC) 10 MG tablet Take 10 mg by mouth daily.    [provider]  clopidogrel (PLAVIX) 75 MG tablet Take 1 tablet (75 mg total) by mouth daily with breakfast. 02/09/21   Martinique, Peter M, MD  clopidogrel (PLAVIX) 75 MG tablet TAKE 1 TABLET (75 MG TOTAL) BY MOUTH DAILY WITH BREAKFAST. 02/09/21 02/09/22  Martinique, Peter M, MD  empagliflozin (JARDIANCE) 25 MG TABS tablet Take 1 tablet (25 mg total) by mouth daily before breakfast. 12/08/20   Martinique, Peter M, MD  fluticasone [facility]) 50 MCG/ACT nasal spray Place 2 sprays into both nostrils daily as needed for allergies.    [provider]  isosorbide mononitrate (IMDUR) 30 MG 24 hr tablet Take 1 tablet (30 mg total) by mouth daily. 02/25/21   Martinique, Peter M, MD  lansoprazole (PREVACID) 15 MG capsule Take 15 mg by mouth daily.    [provider]  lisinopril (ZESTRIL) 20 MG tablet TAKE 1 TABLET BY MOUTH DAILY 08/12/20   Martinique, Peter M, MD  metoprolol tartrate 37.5 MG TABS Take 37.5 mg by mouth 2 (two) times daily. 03/26/21   Quintella Reichert, MD  Multiple Minerals-Vitamins  (CALCIUM-MAGNESIUM-ZINC-D3) TABS Take 1 tablet by mouth daily with lunch.     [provider]  nitroGLYCERIN (NITROSTAT) 0.4 MG SL tablet Place 1 tablet (0.4 mg total) under the tongue every 5 (five) minutes as needed. 12/08/20   Martinique, Peter M, MD  OneTouch Delica Lancets 87F MISC 1 each by Other route daily. 02/14/20   [provider]  [facility] VERIO test strip 1 each by Other route daily. 02/13/20   [provider]  PRALUENT 150 MG/ML SOAJ INJECT 150MG  INTO THE SKIN EVERY 14 DAYS 03/25/21   Martinique, Peter M, MD  Tamsulosin HCl (FLOMAX) 0.4 MG CAPS Take 0.4 mg by mouth daily after supper.    [provider]    Allergies    Statins, Bismuth subsalicylate, Codeine, Kiwi extract, Morphine and related, and Altace [ramipril]  Review of Systems   Review of Systems  Constitutional: Negative for diaphoresis.  Respiratory: Negative for shortness of breath.   Cardiovascular: Positive for chest pain.  Gastrointestinal: Negative for vomiting.  All other systems reviewed and are negative.   Physical Exam Updated Vital Signs BP 117/66   Pulse (!) 40   Temp 98.6 F (37 C) (Oral)   Resp 14   SpO2 99%   Physical Exam Vitals and nursing note reviewed.  Constitutional:      General: He is not in acute distress.    Appearance: He is well-developed. He is not ill-appearing or diaphoretic.  HENT:     Head: Normocephalic and atraumatic.     Right Ear: External ear normal.     Left Ear: External ear normal.     Nose: Nose normal.  Eyes:     General:        Right eye: No discharge.        Left eye: No discharge.  Cardiovascular:     Rate and Rhythm: Regular rhythm. Bradycardia present.     Pulses:          Radial pulses are 2+ on the left side.     Heart sounds: Normal heart sounds.  Pulmonary:  Effort: Pulmonary effort is normal.     Breath sounds: Normal breath sounds.  Abdominal:     Palpations: Abdomen is soft.     Tenderness: There is no abdominal  tenderness.  Musculoskeletal:     Cervical back: Neck supple.  Skin:    General: Skin is warm and dry.  Neurological:     Mental Status: He is alert.  Psychiatric:        Mood and Affect: Mood is not anxious.     ED Results / Procedures / Treatments   Labs (all labs ordered are listed, but only abnormal results are displayed) Labs Reviewed  BASIC METABOLIC PANEL - Abnormal; Notable for the following components:      Result Value   Glucose, Bld 159 (*)    Creatinine, Ser 1.26 (*)    Calcium 8.7 (*)    GFR, Estimated 59 (*)    All other components within normal limits  CBC WITH DIFFERENTIAL/PLATELET - Abnormal; Notable for the following components:   Hemoglobin 12.1 (*)    Platelets 148 (*)    All other components within normal limits  TROPONIN I (HIGH SENSITIVITY) - Abnormal; Notable for the following components:   Troponin I (High Sensitivity) 28 (*)    All other components within normal limits  TROPONIN I (HIGH SENSITIVITY) - Abnormal; Notable for the following components:   Troponin I (High Sensitivity) 30 (*)    All other components within normal limits    EKG EKG Interpretation  Date/Time:  Tuesday March 30 2021 10:08:17 EDT Ventricular Rate:  58 PR Interval:  198 QRS Duration: 125 QT Interval:  450 QTC Calculation: 442 R Axis:   -54 Text Interpretation: Sinus rhythm Left atrial enlargement Left bundle branch block Confirmed by Sherwood Gambler [phone]) on 03/30/2021 10:14:03 AM   Radiology DG Chest 2 View  Result Date: 03/30/2021 CLINICAL DATA:  Chest pain and shoulder pain EXAM: CHEST - 2 VIEW COMPARISON:  03/26/2021 FINDINGS: Previous median sternotomy and CABG procedure. Stable cardiomediastinal contours. No pleural effusion or edema. No airspace densities. Spondylosis noted within the thoracic spine. IMPRESSION: No active cardiopulmonary abnormalities. Electronically Signed   By: Kerby Moors M.D.   On: 03/30/2021 11:04    Procedures Procedures    Medications Ordered in ED Medications  pantoprazole (PROTONIX) EC tablet 20 mg (0 mg Oral Hold 03/30/21 1124)  aspirin chewable tablet 324 mg (324 mg Oral Given 03/30/21 1056)  clopidogrel (PLAVIX) tablet 75 mg (75 mg Oral Given 03/30/21 1057)  isosorbide mononitrate (IMDUR) 24 hr tablet 30 mg (30 mg Oral Given 03/30/21 1057)  metoprolol tartrate (LOPRESSOR) tablet 37.5 mg (37.5 mg Oral Given 03/30/21 1104)  lisinopril (ZESTRIL) tablet 20 mg (20 mg Oral Given 03/30/21 1057)    ED Course  I have reviewed the triage vital signs and the nursing notes.  Pertinent labs & imaging results that were available during my care of the patient were reviewed by me and considered in my medical decision making (see chart for details).    MDM Rules/Calculators/A&P                          Patient is chest pain free. It seems like his chest pain is mostly exertional. Given he was just here for similar symptoms with significant cardiac history, cardiology was consulted and will see patient in ED. Care to Dr. Billy Fischer. Final Clinical Impression(s) / ED Diagnoses Final diagnoses:  None    Rx /  DC Orders ED Discharge Orders    None       Sherwood Gambler, MD 03/30/21 1501

## 2021-03-30 NOTE — Telephone Encounter (Signed)
Spoke to pt. He report he's been experiencing left shoulder and chest pain since last night. He describes it as a dull constant pain. He state he took 1 nitro last night without relief. He denies any other symptoms.  Based on current symptoms, nurse recommended pt report to ER for further evaluations. Pt and wife verbalized understanding.

## 2021-03-30 NOTE — ED Notes (Signed)
Pt just returned from xray 

## 2021-03-30 NOTE — Telephone Encounter (Signed)
This is Dr. Jordan's pt. °

## 2021-03-30 NOTE — ED Triage Notes (Signed)
Pt here with reports of L sided chest and shoulder pain onset last night while pt was asleep. States he was unable to sleep due to the pain. Pt reports pain has resolved at this time.

## 2021-03-30 NOTE — ED Notes (Signed)
Got patient into a gown on the monitor did ekg shown to Dr Goldston patient is resting with call bell in reach 

## 2021-03-31 ENCOUNTER — Inpatient Hospital Stay (HOSPITAL_COMMUNITY)
Admission: EM | Disposition: A | Discharge status: Home / Self Care | Payer: Self-pay | Source: Home / Self Care | Attending: Cardiovascular Disease

## 2021-03-31 ENCOUNTER — Encounter (HOSPITAL_COMMUNITY): Payer: Self-pay | Admitting: Cardiovascular Disease

## 2021-03-31 DIAGNOSIS — Z9181 History of falling: Secondary | ICD-10-CM | POA: Diagnosis not present

## 2021-03-31 DIAGNOSIS — I129 Hypertensive chronic kidney disease with stage 1 through stage 4 chronic kidney disease, or unspecified chronic kidney disease: Secondary | ICD-10-CM | POA: Diagnosis present

## 2021-03-31 DIAGNOSIS — E119 Type 2 diabetes mellitus without complications: Secondary | ICD-10-CM | POA: Diagnosis not present

## 2021-03-31 DIAGNOSIS — E785 Hyperlipidemia, unspecified: Secondary | ICD-10-CM | POA: Diagnosis present

## 2021-03-31 DIAGNOSIS — Z7982 Long term (current) use of aspirin: Secondary | ICD-10-CM | POA: Diagnosis not present

## 2021-03-31 DIAGNOSIS — Z79899 Other long term (current) drug therapy: Secondary | ICD-10-CM | POA: Diagnosis not present

## 2021-03-31 DIAGNOSIS — I480 Paroxysmal atrial fibrillation: Secondary | ICD-10-CM | POA: Diagnosis present

## 2021-03-31 DIAGNOSIS — Z885 Allergy status to narcotic agent status: Secondary | ICD-10-CM | POA: Diagnosis not present

## 2021-03-31 DIAGNOSIS — Z955 Presence of coronary angioplasty implant and graft: Secondary | ICD-10-CM | POA: Diagnosis not present

## 2021-03-31 DIAGNOSIS — N1831 Chronic kidney disease, stage 3a: Secondary | ICD-10-CM | POA: Diagnosis present

## 2021-03-31 DIAGNOSIS — R001 Bradycardia, unspecified: Secondary | ICD-10-CM | POA: Diagnosis present

## 2021-03-31 DIAGNOSIS — F0390 Unspecified dementia without behavioral disturbance: Secondary | ICD-10-CM | POA: Diagnosis present

## 2021-03-31 DIAGNOSIS — I2582 Chronic total occlusion of coronary artery: Secondary | ICD-10-CM | POA: Diagnosis present

## 2021-03-31 DIAGNOSIS — Z951 Presence of aortocoronary bypass graft: Secondary | ICD-10-CM | POA: Diagnosis not present

## 2021-03-31 DIAGNOSIS — I252 Old myocardial infarction: Secondary | ICD-10-CM | POA: Diagnosis not present

## 2021-03-31 DIAGNOSIS — K219 Gastro-esophageal reflux disease without esophagitis: Secondary | ICD-10-CM | POA: Diagnosis present

## 2021-03-31 DIAGNOSIS — Y838 Other surgical procedures as the cause of abnormal reaction of the patient, or of later complication, without mention of misadventure at the time of the procedure: Secondary | ICD-10-CM | POA: Diagnosis present

## 2021-03-31 DIAGNOSIS — I200 Unstable angina: Secondary | ICD-10-CM | POA: Diagnosis not present

## 2021-03-31 DIAGNOSIS — I25110 Atherosclerotic heart disease of native coronary artery with unstable angina pectoris: Principal | ICD-10-CM

## 2021-03-31 DIAGNOSIS — Z86010 Personal history of colonic polyps: Secondary | ICD-10-CM | POA: Diagnosis not present

## 2021-03-31 DIAGNOSIS — Z7984 Long term (current) use of oral hypoglycemic drugs: Secondary | ICD-10-CM | POA: Diagnosis not present

## 2021-03-31 DIAGNOSIS — T82855A Stenosis of coronary artery stent, initial encounter: Secondary | ICD-10-CM | POA: Diagnosis present

## 2021-03-31 DIAGNOSIS — I495 Sick sinus syndrome: Secondary | ICD-10-CM | POA: Diagnosis not present

## 2021-03-31 DIAGNOSIS — Z20822 Contact with and (suspected) exposure to covid-19: Secondary | ICD-10-CM | POA: Diagnosis present

## 2021-03-31 DIAGNOSIS — I25810 Atherosclerosis of coronary artery bypass graft(s) without angina pectoris: Secondary | ICD-10-CM | POA: Diagnosis present

## 2021-03-31 DIAGNOSIS — R079 Chest pain, unspecified: Secondary | ICD-10-CM | POA: Diagnosis present

## 2021-03-31 DIAGNOSIS — N400 Enlarged prostate without lower urinary tract symptoms: Secondary | ICD-10-CM | POA: Diagnosis present

## 2021-03-31 DIAGNOSIS — E1122 Type 2 diabetes mellitus with diabetic chronic kidney disease: Secondary | ICD-10-CM | POA: Diagnosis present

## 2021-03-31 DIAGNOSIS — Z7902 Long term (current) use of antithrombotics/antiplatelets: Secondary | ICD-10-CM | POA: Diagnosis not present

## 2021-03-31 HISTORY — PX: LEFT HEART CATH AND CORS/GRAFTS ANGIOGRAPHY: CATH118250

## 2021-03-31 HISTORY — PX: CORONARY STENT INTERVENTION: CATH118234

## 2021-03-31 LAB — SARS CORONAVIRUS 2 (TAT 6-24 HRS): SARS Coronavirus 2: NEGATIVE

## 2021-03-31 LAB — GLUCOSE, CAPILLARY
Glucose-Capillary: 133 mg/dL — ABNORMAL HIGH (ref 70–99)
Glucose-Capillary: 138 mg/dL — ABNORMAL HIGH (ref 70–99)
Glucose-Capillary: 158 mg/dL — ABNORMAL HIGH (ref 70–99)
Glucose-Capillary: 164 mg/dL — ABNORMAL HIGH (ref 70–99)

## 2021-03-31 LAB — BASIC METABOLIC PANEL
Anion gap: 10 (ref 5–15)
BUN: 20 mg/dL (ref 8–23)
CO2: 28 mmol/L (ref 22–32)
Calcium: 8.7 mg/dL — ABNORMAL LOW (ref 8.9–10.3)
Chloride: 102 mmol/L (ref 98–111)
Creatinine, Ser: 1.23 mg/dL (ref 0.61–1.24)
GFR, Estimated: >60 mL/min (ref >60)
Glucose, Bld: 152 mg/dL — ABNORMAL HIGH (ref 70–99)
Potassium: 4.4 mmol/L (ref 3.5–5.1)
Sodium: 140 mmol/L (ref 135–145)

## 2021-03-31 LAB — CBC
HCT: 38.7 % — ABNORMAL LOW (ref 39.0–52.0)
Hemoglobin: 11.8 g/dL — ABNORMAL LOW (ref 13.0–17.0)
MCH: 26.2 pg (ref 26.0–34.0)
MCHC: 30.5 g/dL (ref 30.0–36.0)
MCV: 85.8 fL (ref 80.0–100.0)
Platelets: 152 10*3/uL (ref 150–400)
RBC: 4.51 MIL/uL (ref 4.22–5.81)
RDW: 13.8 % (ref 11.5–15.5)
WBC: 7.7 10*3/uL (ref 4.0–10.5)
nRBC: 0 % (ref 0.0–0.2)

## 2021-03-31 LAB — LIPID PANEL
Cholesterol: 109 mg/dL (ref 0–200)
HDL: 46 mg/dL (ref >40)
LDL Cholesterol: 34 mg/dL (ref 0–99)
Total CHOL/HDL Ratio: 2.4 RATIO
Triglycerides: 143 mg/dL (ref <150)
VLDL: 29 mg/dL (ref 0–40)

## 2021-03-31 LAB — POCT ACTIVATED CLOTTING TIME
Activated Clotting Time: 303 seconds
Activated Clotting Time: 356 seconds

## 2021-03-31 LAB — HEPARIN LEVEL (UNFRACTIONATED): Heparin Unfractionated: 0.31 IU/mL (ref 0.30–0.70)

## 2021-03-31 LAB — MAGNESIUM: Magnesium: 2 mg/dL (ref 1.7–2.4)

## 2021-03-31 SURGERY — LEFT HEART CATH AND CORS/GRAFTS ANGIOGRAPHY
Anesthesia: LOCAL

## 2021-03-31 MED ORDER — SODIUM CHLORIDE 0.9 % IV SOLN: 250.0000 mL | INTRAVENOUS | Status: DC | PRN

## 2021-03-31 MED ORDER — VERAPAMIL HCL 2.5 MG/ML IV SOLN
INTRAVENOUS | Status: AC
  Filled 2021-03-31: qty 2

## 2021-03-31 MED ORDER — AMLODIPINE BESYLATE 5 MG PO TABS
5.0000 mg | ORAL_TABLET | Freq: Every day | ORAL | Status: DC
  Administered 2021-03-31 – 2021-04-01 (×2): 5 mg via ORAL
  Filled 2021-03-31 (×2): qty 1

## 2021-03-31 MED ORDER — HEPARIN SODIUM (PORCINE) 1000 UNIT/ML IJ SOLN
INTRAMUSCULAR | Status: DC | PRN
  Administered 2021-03-31 (×2): 4000 [IU] via INTRAVENOUS

## 2021-03-31 MED ORDER — SODIUM CHLORIDE 0.9% FLUSH
3.0000 mL | Freq: Two times a day (BID) | INTRAVENOUS | Status: DC
  Administered 2021-03-31: 3 mL via INTRAVENOUS

## 2021-03-31 MED ORDER — LIDOCAINE HCL (PF) 1 % IJ SOLN
INTRAMUSCULAR | Status: AC
  Filled 2021-03-31: qty 30

## 2021-03-31 MED ORDER — NITROGLYCERIN 1 MG/10 ML FOR IR/CATH LAB
INTRA_ARTERIAL | Status: DC | PRN
  Administered 2021-03-31 (×2): 200 ug via INTRACORONARY

## 2021-03-31 MED ORDER — VERAPAMIL HCL 2.5 MG/ML IV SOLN
INTRAVENOUS | Status: DC | PRN
  Administered 2021-03-31: 10 mL via INTRA_ARTERIAL

## 2021-03-31 MED ORDER — MIDAZOLAM HCL 2 MG/2ML IJ SOLN
INTRAMUSCULAR | Status: AC
  Filled 2021-03-31: qty 2

## 2021-03-31 MED ORDER — NITROGLYCERIN IN D5W 200-5 MCG/ML-% IV SOLN
0.0000 ug/min | INTRAVENOUS | Status: DC
  Administered 2021-03-31: 5 ug/min via INTRAVENOUS
  Filled 2021-03-31: qty 250

## 2021-03-31 MED ORDER — LIDOCAINE HCL (PF) 1 % IJ SOLN
INTRAMUSCULAR | Status: DC | PRN
  Administered 2021-03-31: 2 mL via SUBCUTANEOUS

## 2021-03-31 MED ORDER — HEPARIN (PORCINE) IN NACL 1000-0.9 UT/500ML-% IV SOLN
INTRAVENOUS | Status: AC
  Filled 2021-03-31: qty 1000

## 2021-03-31 MED ORDER — SODIUM CHLORIDE 0.9% FLUSH: 3.0000 mL | INTRAVENOUS | Status: DC | PRN

## 2021-03-31 MED ORDER — IOHEXOL 350 MG/ML SOLN
INTRAVENOUS | Status: AC
  Filled 2021-03-31: qty 1

## 2021-03-31 MED ORDER — SODIUM CHLORIDE 0.9 % WEIGHT BASED INFUSION
3.0000 mL/kg/h | INTRAVENOUS | Status: DC
  Administered 2021-03-31: 3 mL/kg/h via INTRAVENOUS

## 2021-03-31 MED ORDER — SODIUM CHLORIDE 0.9 % WEIGHT BASED INFUSION
1.0000 mL/kg/h | INTRAVENOUS | Status: AC
  Administered 2021-03-31: 1 mL/kg/h via INTRAVENOUS

## 2021-03-31 MED ORDER — IOHEXOL 350 MG/ML SOLN
INTRAVENOUS | Status: DC | PRN
  Administered 2021-03-31: 200 mL via INTRA_ARTERIAL

## 2021-03-31 MED ORDER — SODIUM CHLORIDE 0.9 % WEIGHT BASED INFUSION
1.0000 mL/kg/h | INTRAVENOUS | Status: DC
  Administered 2021-03-31: 1 mL/kg/h via INTRAVENOUS

## 2021-03-31 MED ORDER — MIDAZOLAM HCL 2 MG/2ML IJ SOLN
INTRAMUSCULAR | Status: DC | PRN
  Administered 2021-03-31: 0.5 mg via INTRAVENOUS

## 2021-03-31 MED ORDER — SODIUM CHLORIDE 0.9% FLUSH: 3.0000 mL | Freq: Two times a day (BID) | INTRAVENOUS | Status: DC

## 2021-03-31 MED ORDER — HEPARIN SODIUM (PORCINE) 1000 UNIT/ML IJ SOLN
INTRAMUSCULAR | Status: AC
  Filled 2021-03-31: qty 1

## 2021-03-31 SURGICAL SUPPLY — 24 items
BALLN SAPPHIRE 2.0X12 (BALLOONS) ×2
BALLN SAPPHIRE ~~LOC~~ 3.0X15 (BALLOONS) ×1 IMPLANT
BALLN WOLVERINE 2.25X10 (BALLOONS) ×2
BALLOON SAPPHIRE 2.0X12 (BALLOONS) IMPLANT
BALLOON WOLVERINE 2.25X10 (BALLOONS) IMPLANT
CATH DRAGONFLY OPTIS 2.7FR (CATHETERS) ×1 IMPLANT
CATH INFINITI 5 FR IM (CATHETERS) ×2 IMPLANT
CATH OPTITORQUE TIG 4.0 5F (CATHETERS) ×1 IMPLANT
CATH VISTA GUIDE 6FR XB3.5 (CATHETERS) ×1 IMPLANT
DEVICE RAD COMP TR BAND LRG (VASCULAR PRODUCTS) ×1 IMPLANT
GLIDESHEATH SLEND SS 6F .021 (SHEATH) ×1 IMPLANT
GUIDEWIRE INQWIRE 1.5J.035X260 (WIRE) IMPLANT
INQWIRE 1.5J .035X260CM (WIRE) ×2
KIT ENCORE 26 ADVANTAGE (KITS) ×1 IMPLANT
KIT ESSENTIALS PG (KITS) ×1 IMPLANT
KIT HEART LEFT (KITS) ×2 IMPLANT
PACK CARDIAC CATHETERIZATION (CUSTOM PROCEDURE TRAY) ×2 IMPLANT
STENT SYNERGY XD 2.50X12 (Permanent Stent) IMPLANT
STENT SYNERGY XD 2.75X24 (Permanent Stent) IMPLANT
SYNERGY XD 2.50X12 (Permanent Stent) ×2 IMPLANT
SYNERGY XD 2.75X24 (Permanent Stent) ×2 IMPLANT
TRANSDUCER W/STOPCOCK (MISCELLANEOUS) ×2 IMPLANT
TUBING CIL FLEX 10 FLL-RA (TUBING) ×2 IMPLANT
WIRE RUNTHROUGH .014X180CM (WIRE) ×1 IMPLANT

## 2021-03-31 NOTE — Progress Notes (Signed)
[facility] for heparin Indication: chest pain/ACS  Allergies  Allergen Reactions  . Kiwi Extract Anaphylaxis and Other (See Comments)    Makes face red, breaks out into bumps on body, also  . Statins Anaphylaxis and Other (See Comments)    Myalgias, also   . Bismuth Subsalicylate Nausea And Vomiting  . Codeine Nausea And Vomiting  . Morphine And Related Nausea And Vomiting  . Tape Other (See Comments)    "Tape pulls off the skin"- Coban and paper tape are tolerated  . Altace [Ramipril] Rash and Other (See Comments)    All-over body rash    Patient Measurements: Height: 5\' 11"  (180.3 cm) Weight: 72.1 kg (158 lb 15.2 oz) IBW/kg (Calculated) : 75.3 Heparin Dosing Weight: 73 kg   Vital Signs: Temp: 98.1 F (36.7 C) (04/27 0430) Temp Source: Oral (04/27 0430) BP: 154/78 (04/27 0430) Pulse Rate: 58 (04/27 0430)  Labs: Recent Labs    03/30/21 1110 03/30/21 1220 03/31/21 0240  HGB 12.1*  --  11.8*  HCT 39.3  --  38.7*  PLT 148*  --  152  HEPARINUNFRC  --   --  0.31  CREATININE 1.26*  --  1.23  TROPONINIHS 28* 30*  --     Estimated Creatinine Clearance: 52.9 mL/min (by C-G formula based on SCr of 1.23 mg/dL).   Medical History: Past Medical History:  Diagnosis Date  . Arthritis   . Arthritis   . Atrial fibrillation ([place])   . BPH (benign prostatic hypertrophy)   . CAD (coronary artery disease)    stents placed  . Diabetes mellitus   . Diverticulosis   . Dyspnea    when exerting self  . Dysrhythmia    "extra beats"  . Esophageal stricture   . GERD (gastroesophageal reflux disease)   . Headache(784.0)   . Hemorrhoids   . Hernia, inguinal, right   . Hiatal hernia   . Hyperlipidemia   . Hypertension   . Pneumonia   . Staph infection    Left ankle  . 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 Urinary hesitancy     Medications:  Medications Prior to Admission  Medication Sig Dispense Refill Last Dose  .  acetaminophen (TYLENOL) 650 MG CR tablet Take 1,300 mg by mouth every 8 (eight) hours as needed for pain.   Past Week at Unknown time  . aspirin EC 81 MG tablet Take 81 mg by mouth in the morning.   03/29/2021 at 0900  . B Complex-C (B-COMPLEX WITH VITAMIN C) tablet Take 1 tablet by mouth daily with breakfast.    03/29/2021 at am  . Calcium-Magnesium-Zinc (CAL-MAG-ZINC PO) Take 1 tablet by mouth daily with lunch.   03/29/2021 at 1200  . cetirizine (ZYRTEC) 10 MG tablet Take 10 mg by mouth in the morning.   03/29/2021 at am  . clopidogrel (PLAVIX) 75 MG tablet TAKE 1 TABLET (75 MG TOTAL) BY MOUTH DAILY WITH BREAKFAST. (Patient taking differently: Take 75 mg by mouth in the morning.) 90 tablet 3 03/29/2021 at 0900  . empagliflozin (JARDIANCE) 25 MG TABS tablet Take 25 mg by mouth in the morning. 30 tablet  03/29/2021 at am  . fluticasone (FLONASE) 50 MCG/ACT nasal spray Place 2 sprays into both nostrils daily as needed for allergies.   unk  . isosorbide mononitrate (IMDUR) 30 MG 24 hr tablet Take 1 tablet (30 mg total) by mouth daily. 90 tablet 3 03/29/2021 at am  . lansoprazole (PREVACID) 15  MG capsule Take 15 mg by mouth daily.   03/29/2021 at am  . lisinopril (ZESTRIL) 20 MG tablet TAKE 1 TABLET BY MOUTH DAILY (Patient taking differently: Take 10 mg by mouth in the morning.) 30 tablet 11 03/29/2021 at am  . metoprolol tartrate 37.5 MG TABS Take 37.5 mg by mouth 2 (two) times daily. 14 tablet 0 03/29/2021 at 2100  . nitroGLYCERIN (NITROSTAT) 0.4 MG SL tablet Place 1 tablet (0.4 mg total) under the tongue every 5 (five) minutes as needed. (Patient taking differently: Place 0.4 mg under the tongue every 5 (five) minutes as needed for chest pain.) 25 tablet 11 03/29/2021 at 2100  . PRALUENT 150 MG/ML SOAJ INJECT 150MG  INTO THE SKIN EVERY 14 DAYS (Patient taking differently: Inject 150 mg into the skin every 14 (fourteen) days.) 2 mL 11 03/28/2021 at Unknown time  . Tamsulosin HCl (FLOMAX) 0.4 MG CAPS Take 0.4 mg by  mouth at bedtime.   03/29/2021 at pm  . clopidogrel (PLAVIX) 75 MG tablet Take 1 tablet (75 mg total) by mouth daily with breakfast. (Patient not taking: Reported on 03/30/2021) 90 tablet 3 Not Taking at Unknown time  . OneTouch Delica Lancets 35H MISC 1 each by Other route daily.     [facility] VERIO test strip 1 each by Other route daily.       Assessment: 16 YOM with h/o CABG, Afib and CAD here with chest pain. Pharmacy consulted to start IV heparin for ACS with plans for LHC today -heparin level at goal  Goal of Therapy:  Heparin level 0.3-0.7 units/ml Monitor platelets by anticoagulation protocol: Yes   Plan:  --Continue heparin at 950 units/hr -Will follow plans post cath  Hildred Laser, PharmD Clinical Pharmacist **Pharmacist phone directory can now be found on [email or web address] ([email or web address]).  Listed under [place].

## 2021-03-31 NOTE — Progress Notes (Signed)
[place].es his chest  pain is starting again.MD on call Dr.Turner  was called & made aware. V/S taken B/P=161/75;HR=52. EKG done. Received verbal order to start pt.on NTG drip.

## 2021-03-31 NOTE — Interval H&P Note (Signed)
Cath Lab Visit (complete for each Cath Lab visit)  Clinical Evaluation Leading to the Procedure:   ACS: Yes.   Unstable angina  Non-ACS:   N/a      History and Physical Interval Note:  03/31/2021 1:15 PM  [name]  has presented today for surgery, with the diagnosis of unstable angina.  The various methods of treatment have been discussed with the patient and family. After consideration of risks, benefits and other options for treatment, the patient has consented to  Procedure(s): LEFT HEART CATH AND CORS/GRAFTS ANGIOGRAPHY (N/A) as a surgical intervention.  The patient's history has been reviewed, patient examined, no change in status, stable for surgery.  I have reviewed the patient's chart and labs.  Questions were answered to the patient's satisfaction.     Kathlyn [place]

## 2021-03-31 NOTE — H&P (View-Only) (Signed)
   Progress Note  Patient Name: [name] Date of Encounter: 03/31/2021  Primary Cardiologist: Dr. Peter Jordan, MD   Subjective   Denies recurrent chest pain or SOB. Plan for cath today   Inpatient Medications    Scheduled Meds: . aspirin EC  81 mg Oral q AM  . clopidogrel  75 mg Oral Q breakfast  . insulin aspart  0-9 Units Subcutaneous TID WC  . isosorbide mononitrate  60 mg Oral Daily  . metoprolol tartrate  25 mg Oral BID  . pantoprazole  20 mg Oral Once  . pantoprazole  40 mg Oral Daily  . sodium chloride flush  3 mL Intravenous Q12H  . tamsulosin  0.4 mg Oral QHS   Continuous Infusions: . sodium chloride    . sodium chloride 1 mL/kg/hr (03/31/21 0703)  . heparin 950 Units/hr (03/30/21 1837)  . nitroGLYCERIN 5 mcg/min (03/31/21 0402)   PRN Meds: sodium chloride, acetaminophen, nitroGLYCERIN, ondansetron (ZOFRAN) IV, sodium chloride flush   Vital Signs    Vitals:   03/31/21 0000 03/31/21 0032 03/31/21 0200 03/31/21 0430  BP: (!) 143/61 (!) 151/73 (!) 161/75 (!) 154/78  Pulse: (!) 51 (!) 52 (!) 52 (!) 58  Resp: (!) 21   18  Temp: 98.1 F (36.7 C)   98.1 F (36.7 C)  TempSrc: Oral   Oral  SpO2: 99% 100% 100% 99%  Weight: 72.1 kg     Height: 5' 11" (1.803 m)       Intake/Output Summary (Last 24 hours) at 03/31/2021 0727 Last data filed at 03/31/2021 0555 Gross per 24 hour  Intake 100 ml  Output 420 ml  Net -320 ml   Filed Weights   03/31/21 0000  Weight: 72.1 kg    Physical Exam   General: Well developed, well nourished, NAD Neck: Negative for carotid bruits. No JVD Lungs:Clear to ausculation bilaterally. Breathing is unlabored. Cardiovascular: RRR with S1 S2. No murmurs Abdomen: Soft, non-tender, non-distended. No obvious abdominal masses. Extremities: No edema. Radial pulses 2+ bilaterally Neuro: Alert and oriented. No focal deficits. No facial asymmetry. MAE spontaneously. Psych: Responds to questions appropriately with normal affect.     Labs    Chemistry Recent Labs  Lab 03/26/21 1621 03/30/21 1110 03/31/21 0240  NA 139 140 140  K 4.1 4.1 4.4  CL 104 105 102  CO2 27 28 28  GLUCOSE 338* 159* 152*  BUN 23 20 20  CREATININE 1.51* 1.26* 1.23  CALCIUM 8.9 8.7* 8.7*  PROT 5.8*  --   --   ALBUMIN 3.6  --   --   AST 17  --   --   ALT 12  --   --   ALKPHOS 53  --   --   BILITOT 0.6  --   --   GFRNONAA 48* 59* >60  ANIONGAP 8 7 10     Hematology Recent Labs  Lab 03/26/21 1621 03/30/21 1110 03/31/21 0240  WBC 4.9 4.4 7.7  RBC 4.64 4.60 4.51  HGB 12.2* 12.1* 11.8*  HCT 39.9 39.3 38.7*  MCV 86.0 85.4 85.8  MCH 26.3 26.3 26.2  MCHC 30.6 30.8 30.5  RDW 14.0 13.7 13.8  PLT 171 148* 152    Cardiac EnzymesNo results for input(s): TROPONINI in the last 168 hours. No results for input(s): TROPIPOC in the last 168 hours.   BNPNo results for input(s): BNP, PROBNP in the last 168 hours.   DDimer No results for input(s): DDIMER in the   last 168 hours.   Radiology    DG Chest 2 View  Result Date: 03/30/2021 CLINICAL DATA:  Chest pain and shoulder pain EXAM: CHEST - 2 VIEW COMPARISON:  03/26/2021 FINDINGS: Previous median sternotomy and CABG procedure. Stable cardiomediastinal contours. No pleural effusion or edema. No airspace densities. Spondylosis noted within the thoracic spine. IMPRESSION: No active cardiopulmonary abnormalities. Electronically Signed   By: Kerby Moors M.D.   On: 03/30/2021 11:04   Telemetry    03/31/21 SB with HR in the 50's>>baseline - Personally Reviewed  ECG    No new tracing as of 03/31/21- Personally Reviewed  Cardiac Studies   Echo from 12/18/2019:  1. Left ventricular ejection fraction, by visual estimation, is 55 to  60%. The left ventricle has normal function. There is no left ventricular  hypertrophy.  2. Left ventricular diastolic parameters are indeterminate.  3. The left ventricle has no regional wall motion abnormalities.  4. Global right ventricle has normal  systolic function.The right  ventricular size is normal. No increase in right ventricular wall  thickness.  5. Left atrial size was mildly dilated.  6. Right atrial size was mildly dilated.  7. The mitral valve is normal in structure. No evidence of mitral valve  regurgitation. No evidence of mitral stenosis.  8. The tricuspid valve is normal in structure.  9. The aortic valve is normal in structure. Aortic valve regurgitation is  not visualized. No evidence of aortic valve sclerosis or stenosis.  10. The pulmonic valve was normal in structure. Pulmonic valve  regurgitation is not visualized.  11. The atrial septum is grossly normal.   LHC 02/09/21:    Mid LAD-1 lesion is 80% stenosed.  Prox LAD lesion is 25% stenosed.  Mid LAD-2 lesion is 100% stenosed.  Previously placed Dist Cx drug eluting stent is widely patent.  RPAV lesion is 99% stenosed.  Mid RCA lesion is 30% stenosed.  Dist RCA lesion is 30% stenosed.  Prox RCA lesion is 80% stenosed.  A drug-eluting stent was successfully placed using a STENT RESOLUTE ONYX 3.0X12.  Post intervention, there is a 0% residual stenosis.  LIMA and is normal in caliber.  The graft exhibits no disease.  SVG and is normal in caliber.  The graft exhibits no disease.  SVG.  Origin lesion is 100% stenosed.  SVG.  Origin to Prox Graft lesion is 100% stenosed.  2nd Mrg-1 lesion is 90% stenosed.  Post intervention, there is a 0% residual stenosis.  Scoring balloon angioplasty was performed using a BALLOON WOLVERINE 2.50X10.  2nd Mrg-2 lesion is 90% stenosed.  Scoring balloon angioplasty was performed using a BALLOON WOLVERINE 2.50X10.  Post intervention, there is a 35% residual stenosis.  The left ventricular systolic function is normal.  LV end diastolic pressure is normal.  The left ventricular ejection fraction is 55-65% by visual estimate.   1. 3 vessel obstructive CAD 2. Patent LIMA to the LAD 3. Patent  SVG to the diagonal. 4. Known occlusion of SVG to OM2 and SVG to PDA 5. Restenosis of the stents in the proximal and distal OM2 6. Patent stent in the distal LCx. 7. Normal LV function 8. Normal LVEDP 9. Successful scoring balloon angioplasty of the stents in the proximal and distal OM2 10 Successful PCI of the proximal RCA with DES x 1.  Plan: DAPT indefinitely. Continue aggressive medical therapy. Patient is a candidate for same day DC.   Diagnostic Dominance: Right    Intervention  Patient Profile     74 y.o. male with a hx of CAD s/p CABG 12/2019 with subsequent STEMI 03/2020 s/p DES x 2 in the proximal and distal OM2 vessel, PCI of the dLCx(90% stenosis)with DES x 1, PCI to pRCA 02/2021, paroxsymal atrial fibrillation (post op after CABG without recurrence), HTN, HLD, DM2, and GERD who presented to [facility] with chest pain 03/30/21.  Assessment & Plan   1. Unstable angina: -Pt presented with progressive chest pain with known hx of complex CAD s/p CABG 1/2021with subsequent STEMI 03/2020 s/p DES x 2 in the proximal and distal OM2 vessel, PCI of the dLCx(90% stenosis)with DES x 1, and PCI to pRCA 02/2021 -HsT, 28>>90 -Plan for repeat cath today>>denies recurrent chest pain  -Continue DAPT with ASA and Plavix, Imdur increased from 30>>60mg QD, and statin -Beta blocker reduced due to bradycardia  -Hold lisinopril for LHC  -Heparin infusion for ACS  2. Sinus bradycardia: -HR stable in the  -Baseline appears to be in the 50-55bpm  -Pt remains asymptomatic  -Beta blocker reduced to 25mg BID   3. CKD Stage III: -Creatinine, 1.23 -Baseline appears to be in the 0.8-1.0  4. DM2: -HbA1c, 7.2 on 03/17/21 -SSI while inpatient  -On PTA Jardiance   5. HTN: -Elevated, 152/81>>154/78>>161/75 -Lisinopril on hold -Will start amlodipine 5mg QD given elevated BP and recent reduction of BB  -Beta blocker reduced due to bradycardia   6. HLD: -Last LDL, 137 on 03/16/20 -Continue  statin   7. GERD: -Maintained with Protonix   8. BPH: -Flomax resumed   9. Underlying dementia: -Noted during conversation today>>thinks he is 75yo>.denied chest pain prior to hospital presentation with subsequent admission of severe chest pain while mowing -Taking meds?>>will confer with wife on arrival  -May need OP follow up with PCP    Signed, Jill McDaniel NP-C [facility] Pager: [phone] 03/31/2021, 7:27 AM     For questions or updates, please contact   Please consult [email or web address] for contact info under Cardiology/STEMI.  Patient seen, examined. Available data reviewed. Agree with findings, assessment, and plan as outlined by Jill McDaniel, NP-C.  The patient is independently interviewed and examined.  His wife is at the bedside.  He is alert, oriented, in no distress.  JVP is normal, lungs are clear bilaterally, heart is regular rate and rhythm with no murmur gallop, abdomen is soft and nontender, extremities have no edema.  The patient has had no recurrent angina overnight on intravenous nitroglycerin and heparin.  He remains on dual antiplatelet therapy with aspirin and clopidogrel.  As per yesterday's note, plans for cardiac catheterization and possible PCI are reviewed with the patient and his wife today.  They understand the risks and indication.  Left radial artery is palpable and he should be a candidate for left radial catheterization.  Otherwise as outlined above.  [name], M.D. 03/31/2021 1:09 PM    

## 2021-03-31 NOTE — Progress Notes (Signed)
Pt.'s chest pain is relieved.

## 2021-03-31 NOTE — Progress Notes (Signed)
[employer].es left of chest pain 4/10..B/P =151/73;NTG 0.4 mg sl given .

## 2021-03-31 NOTE — Progress Notes (Addendum)
Progress Note  Patient Name: [name] Date of Encounter: 03/31/2021  Primary Cardiologist: Dr. Peter Martinique, MD   Subjective   Denies recurrent chest pain or SOB. Plan for cath today   Inpatient Medications    Scheduled Meds: . aspirin EC  81 mg Oral q AM  . clopidogrel  75 mg Oral Q breakfast  . insulin aspart  0-9 Units Subcutaneous TID WC  . isosorbide mononitrate  60 mg Oral Daily  . metoprolol tartrate  25 mg Oral BID  . pantoprazole  20 mg Oral Once  . pantoprazole  40 mg Oral Daily  . sodium chloride flush  3 mL Intravenous Q12H  . tamsulosin  0.4 mg Oral QHS   Continuous Infusions: . sodium chloride    . sodium chloride 1 mL/kg/hr (03/31/21 0703)  . heparin 950 Units/hr (03/30/21 1837)  . nitroGLYCERIN 5 mcg/min (03/31/21 0402)   PRN Meds: sodium chloride, acetaminophen, nitroGLYCERIN, ondansetron (ZOFRAN) IV, sodium chloride flush   Vital Signs    Vitals:   03/31/21 0000 03/31/21 0032 03/31/21 0200 03/31/21 0430  BP: (!) 143/61 (!) 151/73 (!) 161/75 (!) 154/78  Pulse: (!) 51 (!) 52 (!) 52 (!) 58  Resp: (!) 21   18  Temp: 98.1 F (36.7 C)   98.1 F (36.7 C)  TempSrc: Oral   Oral  SpO2: 99% 100% 100% 99%  Weight: 72.1 kg     Height: 5\' 11"  (1.803 m)       Intake/Output Summary (Last 24 hours) at 03/31/2021 0727 Last data filed at 03/31/2021 0555 Gross per 24 hour  Intake 100 ml  Output 420 ml  Net -320 ml   Filed Weights   03/31/21 0000  Weight: 72.1 kg    Physical Exam   General: Well developed, well nourished, NAD Neck: Negative for carotid bruits. No JVD Lungs:Clear to ausculation bilaterally. Breathing is unlabored. Cardiovascular: RRR with S1 S2. No murmurs Abdomen: Soft, non-tender, non-distended. No obvious abdominal masses. Extremities: No edema. Radial pulses 2+ bilaterally Neuro: Alert and oriented. No focal deficits. No facial asymmetry. MAE spontaneously. Psych: Responds to questions appropriately with normal affect.     Labs    Chemistry Recent Labs  Lab 03/26/21 1621 03/30/21 1110 03/31/21 0240  NA 139 140 140  K 4.1 4.1 4.4  CL 104 105 102  CO2 27 28 28   GLUCOSE 338* 159* 152*  BUN 23 20 20   CREATININE 1.51* 1.26* 1.23  CALCIUM 8.9 8.7* 8.7*  PROT 5.8*  --   --   ALBUMIN 3.6  --   --   AST 17  --   --   ALT 12  --   --   ALKPHOS 53  --   --   BILITOT 0.6  --   --   GFRNONAA 48* 59* >60  ANIONGAP 8 7 10      Hematology Recent Labs  Lab 03/26/21 1621 03/30/21 1110 03/31/21 0240  WBC 4.9 4.4 7.7  RBC 4.64 4.60 4.51  HGB 12.2* 12.1* 11.8*  HCT 39.9 39.3 38.7*  MCV 86.0 85.4 85.8  MCH 26.3 26.3 26.2  MCHC 30.6 30.8 30.5  RDW 14.0 13.7 13.8  PLT 171 148* 152    Cardiac EnzymesNo results for input(s): TROPONINI in the last 168 hours. No results for input(s): TROPIPOC in the last 168 hours.   BNPNo results for input(s): BNP, PROBNP in the last 168 hours.   DDimer No results for input(s): DDIMER in the  last 168 hours.   Radiology    DG Chest 2 View  Result Date: 03/30/2021 CLINICAL DATA:  Chest pain and shoulder pain EXAM: CHEST - 2 VIEW COMPARISON:  03/26/2021 FINDINGS: Previous median sternotomy and CABG procedure. Stable cardiomediastinal contours. No pleural effusion or edema. No airspace densities. Spondylosis noted within the thoracic spine. IMPRESSION: No active cardiopulmonary abnormalities. Electronically Signed   By: Kerby Moors M.D.   On: 03/30/2021 11:04   Telemetry    03/31/21 SB with HR in the 50's>>baseline - Personally Reviewed  ECG    No new tracing as of 03/31/21- Personally Reviewed  Cardiac Studies   Echo from 12/18/2019:  1. Left ventricular ejection fraction, by visual estimation, is 55 to  60%. The left ventricle has normal function. There is no left ventricular  hypertrophy.  2. Left ventricular diastolic parameters are indeterminate.  3. The left ventricle has no regional wall motion abnormalities.  4. Global right ventricle has normal  systolic function.The right  ventricular size is normal. No increase in right ventricular wall  thickness.  5. Left atrial size was mildly dilated.  6. Right atrial size was mildly dilated.  7. The mitral valve is normal in structure. No evidence of mitral valve  regurgitation. No evidence of mitral stenosis.  8. The tricuspid valve is normal in structure.  9. The aortic valve is normal in structure. Aortic valve regurgitation is  not visualized. No evidence of aortic valve sclerosis or stenosis.  10. The pulmonic valve was normal in structure. Pulmonic valve  regurgitation is not visualized.  11. The atrial septum is grossly normal.   LHC 02/09/21:    Mid LAD-1 lesion is 80% stenosed.  Prox LAD lesion is 25% stenosed.  Mid LAD-2 lesion is 100% stenosed.  Previously placed Dist Cx drug eluting stent is widely patent.  RPAV lesion is 99% stenosed.  Mid RCA lesion is 30% stenosed.  Dist RCA lesion is 30% stenosed.  Prox RCA lesion is 80% stenosed.  A drug-eluting stent was successfully placed using a STENT RESOLUTE ONYX 3.0X12.  Post intervention, there is a 0% residual stenosis.  LIMA and is normal in caliber.  The graft exhibits no disease.  SVG and is normal in caliber.  The graft exhibits no disease.  SVG.  Origin lesion is 100% stenosed.  SVG.  Origin to Prox Graft lesion is 100% stenosed.  2nd Mrg-1 lesion is 90% stenosed.  Post intervention, there is a 0% residual stenosis.  Scoring balloon angioplasty was performed using a BALLOON WOLVERINE 2.50X10.  2nd Mrg-2 lesion is 90% stenosed.  Scoring balloon angioplasty was performed using a BALLOON WOLVERINE 2.50X10.  Post intervention, there is a 35% residual stenosis.  The left ventricular systolic function is normal.  LV end diastolic pressure is normal.  The left ventricular ejection fraction is 55-65% by visual estimate.   1. 3 vessel obstructive CAD 2. Patent LIMA to the LAD 3. Patent  SVG to the diagonal. 4. Known occlusion of SVG to OM2 and SVG to PDA 5. Restenosis of the stents in the proximal and distal OM2 6. Patent stent in the distal LCx. 7. Normal LV function 8. Normal LVEDP 9. Successful scoring balloon angioplasty of the stents in the proximal and distal OM2 10 Successful PCI of the proximal RCA with DES x 1.  Plan: DAPT indefinitely. Continue aggressive medical therapy. Patient is a candidate for same day DC.   Diagnostic Dominance: Right    Intervention  Patient Profile     75 y.o. male with a hx of CAD s/p CABG 12/2019 with subsequent STEMI 03/2020 s/p DES x 2 in the proximal and distal OM2 vessel, PCI of the dLCx(90% stenosis)with DES x 1, PCI to [facility] 02/2021, paroxsymal atrial fibrillation (post op after CABG without recurrence), HTN, HLD, DM2, and GERD who presented to [facility] with chest pain 03/30/21.  Assessment & Plan   1. Unstable angina: -Pt presented with progressive chest pain with known hx of complex CAD s/p CABG 1/2021with subsequent STEMI 03/2020 s/p DES x 2 in the proximal and distal OM2 vessel, PCI of the dLCx(90% stenosis)with DES x 1, and PCI to pRCA 02/2021 -HsT, 28>>90 -Plan for repeat cath today>>denies recurrent chest pain  -Continue DAPT with ASA and Plavix, Imdur increased from 30>>60mg  QD, and statin -Beta blocker reduced due to bradycardia  -Hold lisinopril for LHC  -Heparin infusion for ACS  2. Sinus bradycardia: -HR stable in the  -Baseline appears to be in the 50-55bpm  -Pt remains asymptomatic  -Beta blocker reduced to 25mg  BID   3. CKD Stage III: -Creatinine, 1.23 -Baseline appears to be in the 0.8-1.0  4. DM2: -HbA1c, 7.2 on 03/17/21 -SSI while inpatient  -On PTA [facility]   5. HTN: -Elevated, 152/81>>154/78>>161/75 -Lisinopril on hold -Will start amlodipine 5mg  QD given elevated BP and recent reduction of BB  -Beta blocker reduced due to bradycardia   6. HLD: -Last LDL, 137 on 03/16/20 -Continue  statin   7. GERD: -Maintained with Protonix   8. BPH: -Flomax resumed   9. Underlying dementia: -Noted during conversation today>>thinks he is 75yo>.denied chest pain prior to hospital presentation with subsequent admission of severe chest pain while mowing -Taking meds?>>will confer with wife on arrival  -May need OP follow up with PCP    Signed, Kathyrn Drown NP-C [facility] Pager: [phone] 03/31/2021, 7:27 AM     For questions or updates, please contact   Please consult [email or web address] for contact info under Cardiology/STEMI.  Patient seen, examined. Available data reviewed. Agree with findings, assessment, and plan as outlined by Kathyrn Drown, NP-C.  The patient is independently interviewed and examined.  His wife is at the bedside.  He is alert, oriented, in no distress.  JVP is normal, lungs are clear bilaterally, heart is regular rate and rhythm with no murmur gallop, abdomen is soft and nontender, extremities have no edema.  The patient has had no recurrent angina overnight on intravenous nitroglycerin and heparin.  He remains on dual antiplatelet therapy with aspirin and clopidogrel.  As per yesterday's note, plans for cardiac catheterization and possible PCI are reviewed with the patient and his wife today.  They understand the risks and indication.  Left radial artery is palpable and he should be a candidate for left radial catheterization.  Otherwise as outlined above.  Sherren Mocha, M.D. 03/31/2021 1:09 PM

## 2021-04-01 ENCOUNTER — Other Ambulatory Visit (HOSPITAL_COMMUNITY): Payer: Self-pay

## 2021-04-01 ENCOUNTER — Encounter (HOSPITAL_COMMUNITY): Payer: Self-pay | Admitting: Cardiovascular Disease

## 2021-04-01 DIAGNOSIS — N289 Disorder of kidney and ureter, unspecified: Secondary | ICD-10-CM

## 2021-04-01 DIAGNOSIS — N400 Enlarged prostate without lower urinary tract symptoms: Secondary | ICD-10-CM

## 2021-04-01 DIAGNOSIS — R413 Other amnesia: Secondary | ICD-10-CM

## 2021-04-01 DIAGNOSIS — G3184 Mild cognitive impairment, so stated: Secondary | ICD-10-CM

## 2021-04-01 LAB — BASIC METABOLIC PANEL
Anion gap: 10 (ref 5–15)
BUN: 18 mg/dL (ref 8–23)
CO2: 24 mmol/L (ref 22–32)
Calcium: 8.4 mg/dL — ABNORMAL LOW (ref 8.9–10.3)
Chloride: 104 mmol/L (ref 98–111)
Creatinine, Ser: 1.28 mg/dL — ABNORMAL HIGH (ref 0.61–1.24)
GFR, Estimated: 58 mL/min — ABNORMAL LOW (ref >60)
Glucose, Bld: 98 mg/dL (ref 70–99)
Potassium: 4.1 mmol/L (ref 3.5–5.1)
Sodium: 138 mmol/L (ref 135–145)

## 2021-04-01 LAB — GLUCOSE, CAPILLARY
Glucose-Capillary: 120 mg/dL — ABNORMAL HIGH (ref 70–99)
Glucose-Capillary: 145 mg/dL — ABNORMAL HIGH (ref 70–99)

## 2021-04-01 LAB — CBC
HCT: 37.3 % — ABNORMAL LOW (ref 39.0–52.0)
Hemoglobin: 11.7 g/dL — ABNORMAL LOW (ref 13.0–17.0)
MCH: 26.3 pg (ref 26.0–34.0)
MCHC: 31.4 g/dL (ref 30.0–36.0)
MCV: 83.8 fL (ref 80.0–100.0)
Platelets: 156 10*3/uL (ref 150–400)
RBC: 4.45 MIL/uL (ref 4.22–5.81)
RDW: 13.6 % (ref 11.5–15.5)
WBC: 6.4 10*3/uL (ref 4.0–10.5)
nRBC: 0 % (ref 0.0–0.2)

## 2021-04-01 MED ORDER — PANTOPRAZOLE SODIUM 40 MG PO TBEC
40.0000 mg | DELAYED_RELEASE_TABLET | Freq: Every day | ORAL | 0 refills | Status: DC
  Filled 2021-04-01: qty 30, 30d supply, fill #0

## 2021-04-01 MED ORDER — PANTOPRAZOLE SODIUM 40 MG PO TBEC: 40.0000 mg | DELAYED_RELEASE_TABLET | Freq: Every day | ORAL | 3 refills | Status: DC

## 2021-04-01 MED ORDER — ISOSORBIDE MONONITRATE ER 60 MG PO TB24: 60.0000 mg | ORAL_TABLET | Freq: Every day | ORAL | 3 refills | Status: DC

## 2021-04-01 MED ORDER — AMLODIPINE BESYLATE 5 MG PO TABS: 5.0000 mg | ORAL_TABLET | Freq: Every day | ORAL | 3 refills | Status: DC

## 2021-04-01 MED ORDER — CLOPIDOGREL BISULFATE 75 MG PO TABS: 75.0000 mg | ORAL_TABLET | Freq: Every day | ORAL | 3 refills | Status: DC

## 2021-04-01 MED ORDER — AMLODIPINE BESYLATE 5 MG PO TABS
5.0000 mg | ORAL_TABLET | Freq: Every day | ORAL | 0 refills | Status: DC
  Filled 2021-04-01: qty 30, 30d supply, fill #0

## 2021-04-01 MED ORDER — METOPROLOL TARTRATE 25 MG PO TABS: 25.0000 mg | ORAL_TABLET | Freq: Two times a day (BID) | ORAL | 3 refills | Status: DC

## 2021-04-01 MED FILL — Heparin Sod (Porcine)-NaCl IV Soln 1000 Unit/500ML-0.9%: INTRAVENOUS | Qty: 1000 | Status: AC

## 2021-04-01 NOTE — Discharge Instructions (Addendum)
Radial Site Care  This sheet gives you information about how to care for yourself after your procedure. Your health care provider may also give you more specific instructions. If you have problems or questions, contact your health care provider. What can I expect after the procedure? After the procedure, it is common to have:  Bruising and tenderness at the catheter insertion area. Follow these instructions at home: Medicines  Take over-the-counter and prescription medicines only as told by your health care provider. Insertion site care  Follow instructions from your health care provider about how to take care of your insertion site. Make sure you: ? Wash your hands with soap and water before you change your bandage (dressing). If soap and water are not available, use hand sanitizer. ? Change your dressing as told by your health care provider. ? Leave stitches (sutures), skin glue, or adhesive strips in place. These skin closures may need to stay in place for 2 weeks or longer. If adhesive strip edges start to loosen and curl up, you may trim the loose edges. Do not remove adhesive strips completely unless your health care provider tells you to do that.  Check your insertion site every day for signs of infection. Check for: ? Redness, swelling, or pain. ? Fluid or blood. ? Pus or a bad smell. ? Warmth.  Do not take baths, swim, or use a hot tub until your health care provider approves.  You may shower 24-48 hours after the procedure, or as directed by your health care provider. ? Remove the dressing and gently wash the site with plain soap and water. ? Pat the area dry with a clean towel. ? Do not rub the site. That could cause bleeding.  Do not apply powder or lotion to the site. Activity  For 24 hours after the procedure, or as directed by your health care provider: ? Do not flex or bend the affected arm. ? Do not push or pull heavy objects with the affected arm. ? Do not drive  yourself home from the hospital or clinic. You may drive 24 hours after the procedure unless your health care provider tells you not to. ? Do not operate machinery or power tools.  Do not lift anything that is heavier than 10 lb (4.5 kg), or the limit that you are told, until your health care provider says that it is safe.  Ask your health care provider when it is okay to: ? Return to work or school. ? Resume usual physical activities or sports. ? Resume sexual activity.   General instructions  If the catheter site starts to bleed, raise your arm and put firm pressure on the site. If the bleeding does not stop, get help right away. This is a medical emergency.  If you went home on the same day as your procedure, a responsible adult should be with you for the first 24 hours after you arrive home.  Keep all follow-up visits as told by your health care provider. This is important. Contact a health care provider if:  You have a fever.  You have redness, swelling, or yellow drainage around your insertion site. Get help right away if:  You have unusual pain at the radial site.  The catheter insertion area swells very fast.  The insertion area is bleeding, and the bleeding does not stop when you hold steady pressure on the area.  Your arm or hand becomes pale, cool, tingly, or numb. These symptoms may represent a serious   problem that is an emergency. Do not wait to see if the symptoms will go away. Get medical help right away. Call your local emergency services (911 in the [place] Do not drive yourself to the hospital. Summary  After the procedure, it is common to have bruising and tenderness at the site.  Follow instructions from your health care provider about how to take care of your radial site wound. Check the wound every day for signs of infection.  Do not lift anything that is heavier than 10 lb (4.5 kg), or the limit that you are told, until your health care provider says that it  is safe. This information is not intended to replace advice given to you by your health care provider. Make sure you discuss any questions you have with your health care provider. Document Revised: 12/27/2017 Document Reviewed: 12/27/2017 Elsevier Patient Education  2021 [place] about your medication: Plavix (anti-platelet agent)  Generic Name (Brand): clopidogrel (Plavix), once daily medication  PURPOSE: You are taking this medication along with aspirin to lower your chance of having a heart attack, stroke, or blood clots in your heart stent. These can be fatal. Plavix and aspirin help prevent platelets from sticking together and forming a clot that can block an artery or your stent.   Common SIDE EFFECTS you may experience include: bruising or bleeding more easily, shortness of breath  Do not stop taking PLAVIX without talking to the doctor who prescribes it for you. People who are treated with a stent and stop taking Plavix too soon, have a higher risk of getting a blood clot in the stent, having a heart attack, or dying. If you stop Plavix because of bleeding, or for other reasons, your risk of a heart attack or stroke may increase.   Avoid taking NSAID agents or anti-inflammatory medications such as ibuprofen, naproxen given increased bleed risk with plavix - can use acetaminophen (Tylenol) if needed for pain.  Avoid taking over the counter stomach medications omeprazole (Prilosec) or esomeprazole (Nexium) since these do interact and make plavix less effective - ask your pharmacist or doctor for alterative agents if needed for heartburn or GERD.   Tell all of your doctors and dentists that you are taking Plavix. They should talk to the doctor who prescribed Plavix for you before you have any surgery or invasive procedure.   Contact your health care provider if you experience: severe or uncontrollable bleeding, pink/red/brown urine, vomiting blood or vomit that looks like  "coffee grounds", red or black stools (looks like tar), coughing up blood or blood clots ----------------------------------------------------------------------------------------------------------------------

## 2021-04-01 NOTE — Progress Notes (Signed)
Removed TR band from Left radial. Site is a level 0. Paced gauze and Tegaderm to site. Educated pt to leave in pace for 24 hrs. Pt verbalized understanding

## 2021-04-01 NOTE — Telephone Encounter (Signed)
Agree with arrangements.  Ayonna Speranza Martinique MD, [facility]

## 2021-04-01 NOTE — Progress Notes (Signed)
Pt's bed alarm went off. Staff arrived to the room and found pt standing on opposite side of the bed stating he was going to the bathroom. Pt was assisted without incident to the bathroom. Once pt came out of the bathroom he agreed to be weighed. Once he steps off of the scale the patient stated he was going to go to his other room. It was explained to the patient that he was in his room and the patient was encouraged to return to bed. Patietn became verbally and physically aggressive and pushed by staff and walked out into the hallway. Patient attempted to go into other patients' rooms and eventually went out the emergency doors down the fire escape. Pt walked onto the [address] unit still being verbally and physically aggressive. Eventually patient returned to the 6th floor and security was present. This RN also called and notified the patient's wife. Patient eventually agreed to return to his room and wait for his wife to arrice, Cards Fellow came to bedside to see pt. Pt has refused to allow [place] to put on his telemonitor, do his morning EKG, or his vital signs.

## 2021-04-01 NOTE — Progress Notes (Signed)
CARDIAC REHAB PHASE I   PRE:  Rate/Rhythm: off monitor  BP:  Supine:   Sitting: 130/71  Standing:    SaO2: 94%RA  MODE:  Ambulation: 470 ft   POST:  Rate/Rhythm: 73 per pulse ox  BP:  Supine:   Sitting: 141/65  Standing:    SaO2: 97%RA 0855-0945 Pt walked 470 ft on RA with steady gait and no CP. Tolerated well. Review of ed with wife (saw pt in March and did ed with wife then). Discussed importance of plavix with stent. Gave stent card. Gave diabetic and heart healthy diets, and walking guideline. Referred to [place] [facility] again. Wife stated had not heard from program yet. She stated they may be calling pt's phone. She prefers that her number be called as pt has short term memory issues. Notified program to use wife's number in future.    Graylon Good, RN BSN  04/01/2021 9:40 AM

## 2021-04-01 NOTE — Progress Notes (Incomplete)
Progress Note  Patient Name: [name] Date of Encounter: 04/01/2021  Primary Cardiologist: Dr. Peter Martinique, MD   Subjective   Pt with stent intervention to the distal and proximal OM2.  The distal stent did not cover the whole length of the previously placed stent as I was concerned about small vessel size distally and in order not to jail the inferior branch which was diseased.  I elected to restent with different platform given the quick restenosis after recent cath and balloon angioplasty.  Recommendations: Continue dual antiplatelet therapy indefinitely if possible. Aggressive treatment of risk factors. Hydrate overnight given underlying chronic kidney disease.   Inpatient Medications    Scheduled Meds: . amLODipine  5 mg Oral Daily  . aspirin EC  81 mg Oral q AM  . clopidogrel  75 mg Oral Q breakfast  . insulin aspart  0-9 Units Subcutaneous TID WC  . isosorbide mononitrate  60 mg Oral Daily  . metoprolol tartrate  25 mg Oral BID  . pantoprazole  20 mg Oral Once  . pantoprazole  40 mg Oral Daily  . sodium chloride flush  3 mL Intravenous Q12H  . tamsulosin  0.4 mg Oral QHS   Continuous Infusions: . sodium chloride     PRN Meds: sodium chloride, acetaminophen, nitroGLYCERIN, ondansetron (ZOFRAN) IV, sodium chloride flush   Vital Signs    Vitals:   03/31/21 1433 03/31/21 1504 03/31/21 2031 03/31/21 2352  BP: 122/62  129/66 100/83  Pulse: (!) 58  61 86  Resp: 17 16 18    Temp:  97.9 F (36.6 C)  98 F (36.7 C)  TempSrc:  Oral Oral Oral  SpO2: 100%  98% 98%  Weight:      Height:        Intake/Output Summary (Last 24 hours) at 04/01/2021 0741 Last data filed at 04/01/2021 0400 Gross per 24 hour  Intake 640 ml  Output 725 ml  Net -85 ml   Filed Weights   03/31/21 0000  Weight: 72.1 kg    Physical Exam   General: Well developed, well nourished, NAD Skin: Warm, dry, intact  Head: Normocephalic, atraumatic, sclera non-icteric, no xanthomas,  clear, moist mucus membranes. Neck: Negative for carotid bruits. No JVD Lungs:Clear to ausculation bilaterally. No wheezes, rales, or rhonchi. Breathing is unlabored. Cardiovascular: RRR with S1 S2. No murmurs, rubs, gallops, or LV heave appreciated. Abdomen: Soft, non-tender, non-distended with normoactive bowel sounds. No hepatomegaly, No rebound/guarding. No obvious abdominal masses. MSK: Strength and tone appear normal for age. 5/5 in all extremities Extremities: No edema. No clubbing or cyanosis. DP/PT pulses 2+ bilaterally Neuro: Alert and oriented. No focal deficits. No facial asymmetry. MAE spontaneously. Psych: Responds to questions appropriately with normal affect.    Labs    Chemistry Recent Labs  Lab 03/26/21 1621 03/30/21 1110 03/31/21 0240 04/01/21 0213  NA 139 140 140 138  K 4.1 4.1 4.4 4.1  CL 104 105 102 104  CO2 27 28 28 24   GLUCOSE 338* 159* 152* 98  BUN 23 20 20 18   CREATININE 1.51* 1.26* 1.23 1.28*  CALCIUM 8.9 8.7* 8.7* 8.4*  PROT 5.8*  --   --   --   ALBUMIN 3.6  --   --   --   AST 17  --   --   --   ALT 12  --   --   --   ALKPHOS 53  --   --   --  BILITOT 0.6  --   --   --   GFRNONAA 48* 59* >60 58*  ANIONGAP 8 7 10 10      Hematology Recent Labs  Lab 03/30/21 1110 03/31/21 0240 04/01/21 0213  WBC 4.4 7.7 6.4  RBC 4.60 4.51 4.45  HGB 12.1* 11.8* 11.7*  HCT 39.3 38.7* 37.3*  MCV 85.4 85.8 83.8  MCH 26.3 26.2 26.3  MCHC 30.8 30.5 31.4  RDW 13.7 13.8 13.6  PLT 148* 152 156    Cardiac EnzymesNo results for input(s): TROPONINI in the last 168 hours. No results for input(s): TROPIPOC in the last 168 hours.   BNPNo results for input(s): BNP, PROBNP in the last 168 hours.   DDimer No results for input(s): DDIMER in the last 168 hours.   Radiology    DG Chest 2 View  Result Date: 03/30/2021 CLINICAL DATA:  Chest pain and shoulder pain EXAM: CHEST - 2 VIEW COMPARISON:  03/26/2021 FINDINGS: Previous median sternotomy and CABG procedure.  Stable cardiomediastinal contours. No pleural effusion or edema. No airspace densities. Spondylosis noted within the thoracic spine. IMPRESSION: No active cardiopulmonary abnormalities. Electronically Signed   By: Kerby Moors M.D.   On: 03/30/2021 11:04   CARDIAC CATHETERIZATION  Result Date: 03/31/2021  Mid LAD-1 lesion is 80% stenosed.  Prox LAD lesion is 25% stenosed.  Mid LAD-2 lesion is 100% stenosed.  2nd Mrg-2 lesion is 99% stenosed.  RPAV lesion is 99% stenosed.  Mid RCA lesion is 30% stenosed.  Dist RCA lesion is 30% stenosed.  Previously placed Prox RCA drug eluting stent is widely patent.  The left ventricular systolic function is normal.  LV end diastolic pressure is mildly elevated.  The left ventricular ejection fraction is 55-65% by visual estimate.  Previously placed Dist Cx stent (unknown type) is widely patent.  Mid Cx to Dist Cx lesion is 30% stenosed.  Post intervention, there is a 0% residual stenosis.  A drug-eluting stent was successfully placed using a SYNERGY XD 2.50X12.  2nd Mrg-1 lesion is 90% stenosed.  Post intervention, there is a 0% residual stenosis.  A drug-eluting stent was successfully placed using a SYNERGY XD 2.75X24.  Lat 2nd Mrg lesion is 50% stenosed.  1.  Significant underlying three-vessel coronary artery disease with patent LIMA to LAD and patent SVG to diagonal.  Chronically occluded SVG to OM 2 and SVG to right PDA.  Patent RCA stents with mild in-stent restenosis in the mid and distal stents.  Patent distal left circumflex stent with minimal restenosis.  Severe in-stent restenosis in the proximal as well as distal stent in a large OM2. 2.  Normal LV systolic function mildly elevated left ventricular end-diastolic pressure. 3.  Successful OCT guided PCI and drug-eluting stent placement to the distal and proximal OM2.  The distal stent did not cover the whole length of the previously placed stent as I was concerned about small vessel size distally  and in order not to jail the inferior branch which was diseased.  I elected to restent with different platform given the quick restenosis after recent cath and balloon angioplasty. Recommendations: Continue dual antiplatelet therapy indefinitely if possible. Aggressive treatment of risk factors. Hydrate overnight given underlying chronic kidney disease.   Telemetry    04/01/21 SB with HR in the 50-60's- Personally Reviewed  ECG    No new tracing as of 04/01/21 - Personally Reviewed  Cardiac Studies   LHC with stent intervention 03/31/21:   Mid LAD-1 lesion is 80% stenosed.  Prox LAD lesion is 25% stenosed.  Mid LAD-2 lesion is 100% stenosed.  2nd Mrg-2 lesion is 99% stenosed.  RPAV lesion is 99% stenosed.  Mid RCA lesion is 30% stenosed.  Dist RCA lesion is 30% stenosed.  Previously placed Prox RCA drug eluting stent is widely patent.  The left ventricular systolic function is normal.  LV end diastolic pressure is mildly elevated.  The left ventricular ejection fraction is 55-65% by visual estimate.  Previously placed Dist Cx stent (unknown type) is widely patent.  Mid Cx to Dist Cx lesion is 30% stenosed.  Post intervention, there is a 0% residual stenosis.  A drug-eluting stent was successfully placed using a SYNERGY XD 2.50X12.  2nd Mrg-1 lesion is 90% stenosed.  Post intervention, there is a 0% residual stenosis.  A drug-eluting stent was successfully placed using a SYNERGY XD 2.75X24.  Lat 2nd Mrg lesion is 50% stenosed.   1.  Significant underlying three-vessel coronary artery disease with patent LIMA to LAD and patent SVG to diagonal.  Chronically occluded SVG to OM 2 and SVG to right PDA.  Patent RCA stents with mild in-stent restenosis in the mid and distal stents.  Patent distal left circumflex stent with minimal restenosis.  Severe in-stent restenosis in the proximal as well as distal stent in a large OM2. 2.  Normal LV systolic function mildly elevated  left ventricular end-diastolic pressure. 3.  Successful OCT guided PCI and drug-eluting stent placement to the distal and proximal OM2.  The distal stent did not cover the whole length of the previously placed stent as I was concerned about small vessel size distally and in order not to jail the inferior branch which was diseased.  I elected to restent with different platform given the quick restenosis after recent cath and balloon angioplasty.  Recommendations: Continue dual antiplatelet therapy indefinitely if possible. Aggressive treatment of risk factors. Hydrate overnight given underlying chronic kidney disease.   Echo from 12/18/2019:  1. Left ventricular ejection fraction, by visual estimation, is 55 to  60%. The left ventricle has normal function. There is no left ventricular  hypertrophy.  2. Left ventricular diastolic parameters are indeterminate.  3. The left ventricle has no regional wall motion abnormalities.  4. Global right ventricle has normal systolic function.The right  ventricular size is normal. No increase in right ventricular wall  thickness.  5. Left atrial size was mildly dilated.  6. Right atrial size was mildly dilated.  7. The mitral valve is normal in structure. No evidence of mitral valve  regurgitation. No evidence of mitral stenosis.  8. The tricuspid valve is normal in structure.  9. The aortic valve is normal in structure. Aortic valve regurgitation is  not visualized. No evidence of aortic valve sclerosis or stenosis.  10. The pulmonic valve was normal in structure. Pulmonic valve  regurgitation is not visualized.  11. The atrial septum is grossly normal.   LHC 02/09/21:   Mid LAD-1 lesion is 80% stenosed.  Prox LAD lesion is 25% stenosed.  Mid LAD-2 lesion is 100% stenosed.  Previously placed Dist Cx drug eluting stent is widely patent.  RPAV lesion is 99% stenosed.  Mid RCA lesion is 30% stenosed.  Dist RCA lesion is 30%  stenosed.  Prox RCA lesion is 80% stenosed.  A drug-eluting stent was successfully placed using a STENT RESOLUTE ONYX 3.0X12.  Post intervention, there is a 0% residual stenosis.  LIMA and is normal in caliber.  The graft exhibits no disease.  SVG and is normal in caliber.  The graft exhibits no disease.  SVG.  Origin lesion is 100% stenosed.  SVG.  Origin to Prox Graft lesion is 100% stenosed.  2nd Mrg-1 lesion is 90% stenosed.  Post intervention, there is a 0% residual stenosis.  Scoring balloon angioplasty was performed using a BALLOON WOLVERINE 2.50X10.  2nd Mrg-2 lesion is 90% stenosed.  Scoring balloon angioplasty was performed using a BALLOON WOLVERINE 2.50X10.  Post intervention, there is a 35% residual stenosis.  The left ventricular systolic function is normal.  LV end diastolic pressure is normal.  The left ventricular ejection fraction is 55-65% by visual estimate.  1. 3 vessel obstructive CAD 2. Patent LIMA to the LAD 3. Patent SVG to the diagonal. 4. Known occlusion of SVG to OM2 and SVG to PDA 5. Restenosis of the stents in the proximal and distal OM2 6. Patent stent in the distal LCx. 7. Normal LV function 8. Normal LVEDP 9. Successful scoring balloon angioplasty of the stents in the proximal and distal OM2 10 Successful PCI of the proximal RCA with DES x 1.  Plan: DAPT indefinitely. Continue aggressive medical therapy. Patient is a candidate for same day DC.   Diagnostic Dominance: Right    Intervention       Patient Profile     75 y.o. male with a hx of CAD s/p CABG 12/2019 with subsequent STEMI 03/2020 s/p DES x 2 in the proximal and distal OM2 vessel, PCI of the dLCx(90% stenosis)with DES x 1, PCI to [facility] 02/2021, paroxsymal atrial fibrillation (post op after CABG without recurrence), HTN, HLD, DM2,and GERD who presented to [facility] with chest pain 03/30/21.  Assessment & Plan     1. Unstable angina: -Pt presented with  progressive chest pain with known hx of complex CAD s/p CABG 1/2021with subsequent STEMI 03/2020 s/p DES x 2 in the proximal and distal OM2 vessel, PCI of the dLCx(90% stenosis)with DES x 1, and PCI to [facility] 02/2021 -Underwent stent intervention to distal and proximal OM2>>continue dual antiplatelet therapy indefinitely with ASA and Plavix - Imdur increased from 30>>60mg  QD, and statin -Beta blocker reduced due to bradycardia   2. Sinus bradycardia: -HR stable in the 50-60's  -Baseline appears to be in the 50-55bpm  -Pt remains asymptomatic  -Beta blocker reduced to 25mg  BID   3. CKD Stage III: -Creatinine, 1.23>>1.28 today  -Baseline appears to be in the 0.8-1.0 -Hydrated post cath>>plan to recheck BMET at follow up   4. DM2: -HbA1c, 7.2 on 03/17/21 -SSI while inpatient  -Restarted Jardiance today   5. HTN: -Improved, 100/83>>129/66>>122/62 -Continue amlodipine 5mg  QD -Beta blocker reduced due to bradycardia  -Hold on restarting lisinopril given great BP and mild renal dysfunction   6. HLD: -Last LDL, 137 on 03/16/20 -Continue statin   7. GERD: -Maintained with Protonix   8. BPH: -Flomax resumed   9. Underlying dementia: -Noted during conversation today>>thinks he is 75yo>.denied chest pain prior to hospital presentation with subsequent admission of severe chest pain while mowing -Taking meds?>>will confer with wife on arrival  -May need OP follow up with PCP     Signed, Kathyrn Drown NP-C [facility] Pager: [phone] 04/01/2021, 7:41 AM     For questions or updates, please contact   Please consult [email or web address] for contact info under Cardiology/STEMI.

## 2021-04-01 NOTE — Discharge Summary (Signed)
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  Prox LAD lesion is 25% stenosed.  Mid LAD-2 lesion is 100% stenosed.  2nd Mrg-2 lesion is 99% stenosed.  RPAV lesion is 99% stenosed.  Mid RCA lesion is 30% stenosed.  Dist RCA lesion is 30% stenosed.  Previously placed Prox RCA drug eluting stent is widely patent.  The left ventricular systolic function is normal.  LV end diastolic pressure is mildly elevated.  The left ventricular ejection fraction is 55-65% by visual estimate.  Previously placed Dist Cx stent (unknown type) is widely patent.  Mid Cx to Dist Cx lesion is 30% stenosed.  Post intervention, there is a 0% residual stenosis.  A drug-eluting stent was successfully placed using a SYNERGY XD 2.50X12.  2nd Mrg-1 lesion is 90% stenosed.  Post intervention, there is a 0% residual stenosis.  A drug-eluting stent was successfully placed using a SYNERGY XD 2.75X24.  Lat 2nd Mrg lesion is 50% stenosed.  1. Significant underlying three-vessel coronary artery disease with patent LIMA to LAD and patent SVG to diagonal. Chronically occluded SVG to OM 2 and SVG to right PDA. Patent RCA stents with mild in-stent restenosis in the mid and distal stents.  Patent distal left circumflex stent with minimal restenosis. Severe in-stent restenosis in the proximal as well as distal stent in a large OM2. 2. Normal LV systolic function mildly elevated left ventricular end-diastolic pressure. 3. Successful OCT guided PCI and drug-eluting stent placement to the distal and proximal OM2. The distal stent did not cover the whole length of the previously placed stent as I was concerned about small vessel size distally and in order not to jail the inferior branch which was diseased. I elected to restent with different platform given the quick restenosis after recent cath and balloon angioplasty.  Recommendations: Continue dual antiplatelet therapy indefinitely if possible. Aggressive treatment of risk factors. Hydrate overnight given underlying chronic kidney disease. _____________   History of Present Illness     [name] is a 75 y.o. 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  Hospital Course     Given significant CV hx, plan was to pursue LHC. This was performed 03/31/21 which showed severe in-stent restenosis in the proximal and distal OM2 vessel treated with OCT guided PCI/DES. The distal stent did not cover the whole length of the previously placed stent and there was concern about small vessel size distally. In order not jail the inferior branch which was diseased, the plan was to restent with  different platform given the quick restenosis after recent cath and balloon angioplasty. Recommendations were to continue with dual antiplatelet therapy with ASA/Plavix and aggressive treatment of risk factors.  Hospital issues include:  Unstable angina: -Pt presented  with progressive chest pain with known hx of complex CAD s/p CABG 1/2021with subsequent STEMI 03/2020 s/pDES x 2 intheproximal and distalOM2vessel,PCI of thedLCx(90% stenosis)with DES x 1, and PCI to [facility] 02/2021 -Underwent stent intervention to distal and proximal OM2>>continue dual antiplatelet therapy indefinitely with ASA and Plavix -Imdur increased from 30>>60mg  QD>>continue  -Beta blocker reduced 37.5mg  BID due to bradycardia>>continue    Sinus bradycardia: -HR stable in the 50-60's>>asymptomatic  -Baseline appears to be in the 50-55bpm   CKD Stage III: -Creatinine, 1.28 post cath/day of discharge  -Baseline appears to be in the 0.8-1.0 -Hydrated post cath>>plan to recheck BMET at follow up   DM2: -HbA1c, 7.2 on 03/17/21 -SSI while inpatient  -Restarted Jardiance on day of discharge    HTN: -Improved, 100/83>>129/66>>122/62 -Continue amlodipine 5mg  QD -Beta blocker reduced due to bradycardia  -Hold on restarting lisinopril given great BP and mild renal dysfunction   HLD: -Last LDL, 137on4/12/21 -Follows with lipid clinic>>continue Repatha   GERD: -Maintained with Protonix   BPH: -Flomax resumed  Underlying dementia: -Had issues with confusion during his hospital course however I feel he has underlying dementia that he will need to follow with his PCP -Wife reports he becomes more confused after receiving conscious sedation/anesthesia therefore, would plan to have her stay with any further interventions that he may need in the future.     Consultants: None   General: Well developed, well nourished, NAD Neck: Negative for carotid bruits. No JVD Lungs:Clear to ausculation bilaterally. Breathing is unlabored. Cardiovascular: RRR with S1 S2. No murmurs Abdomen: Soft, non-tender, non-distended. No obvious abdominal masses. Extremities: No edema. Radial pulses 2+ bilaterally Neuro: Alert and oriented to person and place. No focal deficits. No facial  asymmetry. MAE spontaneously. Psych: Responds to questions somewhat appropriately with normal affect.     The patient was seen and examined by Dr. Burt Knack who feels that he is stable and ready for discharge today, 04/01/21. Follow up appointments have been made. Cath site is stable and free from bleeding or hematoma. Cardiac rehab has ambulated the patient and completed all post cath/intervention education   Did the patient have an acute coronary syndrome (MI, NSTEMI, STEMI, etc) this admission?:  No                               Did the patient have a percutaneous coronary intervention (stent / angioplasty)?:  Yes.     Cath/PCI Registry Performance & Quality Measures: 1. Aspirin prescribed? - Yes 2. ADP Receptor Inhibitor (Plavix/Clopidogrel, Brilinta/Ticagrelor or Effient/Prasugrel) prescribed (includes medically managed patients)? - Yes 3. High Intensity Statin (Lipitor 40-80mg  or Crestor 20-40mg ) prescribed? - No - intolerant>.on repatha 4. For EF <40%, was ACEI/ARB prescribed? - Not Applicable (EF >/= 62%) 5. For EF <40%, Aldosterone Antagonist (Spironolactone or Eplerenone) prescribed? - Not Applicable (EF >/= 13%) 6. Cardiac Rehab Phase II ordered? - Yes ____________  Discharge Vitals Blood pressure 125/72, pulse 89, temperature 98 F (36.7 C), temperature source Oral, resp. rate 16, height 5\' 11"  (1.803 m), weight 71.3 kg, SpO2 100 %.  Filed Weights   03/31/21 0000 04/01/21 0842  Weight: 72.1 kg 71.3 kg   Labs & Radiologic Studies    CBC Recent Labs  03/30/21 1110 03/31/21 0240 04/01/21 0213  WBC 4.4 7.7 6.4  NEUTROABS 2.9  --   --   HGB 12.1* 11.8* 11.7*  HCT 39.3 38.7* 37.3*  MCV 85.4 85.8 83.8  PLT 148* 152 161   Basic Metabolic Panel Recent Labs    03/31/21 0240 04/01/21 0213  NA 140 138  K 4.4 4.1  CL 102 104  CO2 28 24  GLUCOSE 152* 98  BUN 20 18  CREATININE 1.23 1.28*  CALCIUM 8.7* 8.4*  MG 2.0  --    Liver Function Tests No results for  input(s): AST, ALT, ALKPHOS, BILITOT, PROT, ALBUMIN in the last 72 hours. No results for input(s): LIPASE, AMYLASE in the last 72 hours. High Sensitivity Troponin:   Recent Labs  Lab 03/26/21 1621 03/26/21 1821 03/30/21 1110 03/30/21 1220  TROPONINIHS 24* 42* 28* 30*    BNP Invalid input(s): POCBNP D-Dimer No results for input(s): DDIMER in the last 72 hours. Hemoglobin A1C No results for input(s): HGBA1C in the last 72 hours. Fasting Lipid Panel Recent Labs    03/31/21 0240  CHOL 109  HDL 46  LDLCALC 34  TRIG 143  CHOLHDL 2.4   Thyroid Function Tests No results for input(s): TSH, T4TOTAL, T3FREE, THYROIDAB in the last 72 hours.  Invalid input(s): FREET3 _____________  DG Chest 2 View  Result Date: 03/30/2021 CLINICAL DATA:  Chest pain and shoulder pain EXAM: CHEST - 2 VIEW COMPARISON:  03/26/2021 FINDINGS: Previous median sternotomy and CABG procedure. Stable cardiomediastinal contours. No pleural effusion or edema. No airspace densities. Spondylosis noted within the thoracic spine. IMPRESSION: No active cardiopulmonary abnormalities. Electronically Signed   By: Kerby Moors M.D.   On: 03/30/2021 11:04   DG Chest 2 View  Result Date: 03/26/2021 CLINICAL DATA:  Chest pain EXAM: CHEST - 2 VIEW COMPARISON:  03/17/2020 FINDINGS: Post sternotomy changes. Calcified nodule in the left mid lung. Stable cardiomediastinal silhouette. No pneumothorax. IMPRESSION: No active cardiopulmonary disease. Electronically Signed   By: Donavan Foil M.D.   On: 03/26/2021 17:24   CARDIAC CATHETERIZATION  Result Date: 03/31/2021  Mid LAD-1 lesion is 80% stenosed.  Prox LAD lesion is 25% stenosed.  Mid LAD-2 lesion is 100% stenosed.  2nd Mrg-2 lesion is 99% stenosed.  RPAV lesion is 99% stenosed.  Mid RCA lesion is 30% stenosed.  Dist RCA lesion is 30% stenosed.  Previously placed Prox RCA drug eluting stent is widely patent.  The left ventricular systolic function is normal.  LV end  diastolic pressure is mildly elevated.  The left ventricular ejection fraction is 55-65% by visual estimate.  Previously placed Dist Cx stent (unknown type) is widely patent.  Mid Cx to Dist Cx lesion is 30% stenosed.  Post intervention, there is a 0% residual stenosis.  A drug-eluting stent was successfully placed using a SYNERGY XD 2.50X12.  2nd Mrg-1 lesion is 90% stenosed.  Post intervention, there is a 0% residual stenosis.  A drug-eluting stent was successfully placed using a SYNERGY XD 2.75X24.  Lat 2nd Mrg lesion is 50% stenosed.  1.  Significant underlying three-vessel coronary artery disease with patent LIMA to LAD and patent SVG to diagonal.  Chronically occluded SVG to OM 2 and SVG to right PDA.  Patent RCA stents with mild in-stent restenosis in the mid and distal stents.  Patent distal left circumflex stent with minimal restenosis.  Severe in-stent restenosis in the proximal as well as distal stent in a large OM2. 2.  Normal LV systolic function  mildly elevated left ventricular end-diastolic pressure. 3.  Successful OCT guided PCI and drug-eluting stent placement to the distal and proximal OM2.  The distal stent did not cover the whole length of the previously placed stent as I was concerned about small vessel size distally and in order not to jail the inferior branch which was diseased.  I elected to restent with different platform given the quick restenosis after recent cath and balloon angioplasty. Recommendations: Continue dual antiplatelet therapy indefinitely if possible. Aggressive treatment of risk factors. Hydrate overnight given underlying chronic kidney disease.   Disposition   Pt is being discharged home today in good condition.  Follow-up Plans & Appointments    Follow-up Information    Almyra Deforest, [place] Follow up on 04/19/2021.   Specialties: Cardiology, Radiology Why: at 8:15am  Contact information: [address] [place] [address] [phone]               Discharge Instructions    Amb Referral to Cardiac Rehabilitation   Complete by: As directed    CRP 2--Please call wife on her phone regarding program   Diagnosis: Coronary Stents   After initial evaluation and assessments completed: Virtual Based Care may be provided alone or in conjunction with Phase 2 Cardiac Rehab based on patient barriers.: Yes   Call MD for:  difficulty breathing, headache or visual disturbances   Complete by: As directed    Call MD for:  extreme fatigue   Complete by: As directed    Call MD for:  hives   Complete by: As directed    Call MD for:  persistant dizziness or light-headedness   Complete by: As directed    Call MD for:  persistant nausea and vomiting   Complete by: As directed    Call MD for:  redness, tenderness, or signs of infection (pain, swelling, redness, odor or green/yellow discharge around incision site)   Complete by: As directed    Call MD for:  severe uncontrolled pain   Complete by: As directed    Call MD for:  temperature >100.4   Complete by: As directed    Diet - low sodium heart healthy   Complete by: As directed    Discharge instructions   Complete by: As directed    PLEASE DO NOT Ashley!!! Also keep a log of you blood pressures and bring back to your follow up appt. Please call the office with any questions.   Patients taking blood thinners should generally stay away from medicines like ibuprofen, Advil, Motrin, naproxen, and Aleve due to risk of stomach bleeding. You may take Tylenol as directed or talk to your primary doctor about alternatives.  PLEASE ENSURE THAT YOU DO NOT RUN OUT OF YOUR PLAVIX. IF you have issues obtaining this medication due to cost please CALL the office 3-5 business days prior to running out in order to prevent missing doses of this medication.  No driving for 3-5 days. No lifting over 5 lbs for 1 week. No sexual activity for 1 week. Keep procedure site clean & dry. If  you notice increased pain, swelling, bleeding or pus, call/return!  You may shower, but no soaking baths/hot tubs/pools for 1 week.   Increase activity slowly   Complete by: As directed      Discharge Medications   Allergies as of 04/01/2021      Reactions   Kiwi Extract Anaphylaxis, Other (See Comments)   Makes face red,  breaks out into bumps on body, also   Statins Anaphylaxis, Other (See Comments)   Myalgias, also   Bismuth Subsalicylate Nausea And Vomiting   Codeine Nausea And Vomiting   Morphine And Related Nausea And Vomiting   Tape Other (See Comments)   "Tape pulls off the skin"- Coban and paper tape are tolerated   Altace [ramipril] Rash, Other (See Comments)   All-over body rash      Medication List    STOP taking these medications   lansoprazole 15 MG capsule Commonly known as: PREVACID Replaced by: pantoprazole 40 MG tablet   lisinopril 20 MG tablet Commonly known as: ZESTRIL     TAKE these medications   acetaminophen 650 MG CR tablet Commonly known as: TYLENOL Take 1,300 mg by mouth every 8 (eight) hours as needed for pain.   amLODipine 5 MG tablet Commonly known as: NORVASC Take 1 tablet (5 mg total) by mouth daily.   aspirin EC 81 MG tablet Take 81 mg by mouth in the morning.   B-complex with vitamin C tablet Take 1 tablet by mouth daily with breakfast.   CAL-MAG-ZINC PO Take 1 tablet by mouth daily with lunch.   cetirizine 10 MG tablet Commonly known as: ZYRTEC Take 10 mg by mouth in the morning.   clopidogrel 75 MG tablet Commonly known as: PLAVIX Take 1 tablet (75 mg total) by mouth daily with breakfast. What changed: Another medication with the same name was removed. Continue taking this medication, and follow the directions you see here.   empagliflozin 25 MG Tabs tablet Commonly known as: JARDIANCE Take 25 mg by mouth in the morning.   fluticasone 50 MCG/ACT nasal spray Commonly known as: FLONASE Place 2 sprays into both nostrils  daily as needed for allergies.   isosorbide mononitrate 60 MG 24 hr tablet Commonly known as: IMDUR Take 1 tablet (60 mg total) by mouth daily. What changed:   medication strength  how much to take   metoprolol tartrate 25 MG tablet Commonly known as: LOPRESSOR Take 1 tablet (25 mg total) by mouth 2 (two) times daily. What changed:   medication strength  how much to take   nitroGLYCERIN 0.4 MG SL tablet Commonly known as: Nitrostat Place 1 tablet (0.4 mg total) under the tongue every 5 (five) minutes as needed. What changed: reasons to take this   OneTouch Delica Lancets 99991111 Misc 1 each by Other route daily.   OneTouch Verio test strip Generic drug: glucose blood 1 each by Other route daily.   pantoprazole 40 MG tablet Commonly known as: PROTONIX Take 1 tablet (40 mg total) by mouth daily. Replaces: lansoprazole 15 MG capsule   Praluent 150 MG/ML Soaj Generic drug: Alirocumab INJECT 150MG  INTO THE SKIN EVERY 14 DAYS What changed: See the new instructions.   tamsulosin 0.4 MG Caps capsule Commonly known as: FLOMAX Take 0.4 mg by mouth at bedtime.       Outstanding Labs/Studies   BMET   Duration of Discharge Encounter   Greater than 30 minutes including physician time.  Signed, Kathyrn Drown, NP 04/01/2021, 12:24 PM  Patient seen, examined. Available data reviewed. Agree with findings, assessment, and plan as outlined by Kathyrn Drown, NP.  On exam the patient is alert and oriented, no distress.  Wife is at bedside.  Lungs are clear, heart is regular rate and rhythm with no murmur gallop, abdomen is soft and nontender with no masses, extremities have no edema, left radial cath site is clear with  no hematoma or ecchymosis.  Cardiac cath films from yesterday reviewed and patient underwent successful stenting of 2 separate lesions of in-stent restenosis in the obtuse marginal branch of the circumflex.  The procedural result is excellent.  He has had no recurrent  angina.  He continues on dual antiplatelet therapy with aspirin and clopidogrel.  The patient had some confusion last night and he has significant baseline dementia.  He is back in his normal state of health and cognition today.  He is medically stable for hospital discharge.  He already has follow-up scheduled with Dr. Martinique in the near future.  All of their questions are answered.  Sherren Mocha, M.D. 04/01/2021 12:24 PM

## 2021-04-05 NOTE — Telephone Encounter (Signed)
Spoke to patient's wife stated husband is doing good.Dr.Jordan advised keep appointments as planned.

## 2021-04-19 ENCOUNTER — Other Ambulatory Visit: Payer: Self-pay

## 2021-04-19 ENCOUNTER — Ambulatory Visit (INDEPENDENT_AMBULATORY_CARE_PROVIDER_SITE_OTHER): Payer: BC Managed Care – PPO | Admitting: Physician Assistant

## 2021-04-19 ENCOUNTER — Encounter: Payer: Self-pay | Admitting: Physician Assistant

## 2021-04-19 VITALS — BP 130/70 | HR 50 | Ht 71.0 in | Wt 158.8 lb

## 2021-04-19 DIAGNOSIS — E119 Type 2 diabetes mellitus without complications: Secondary | ICD-10-CM

## 2021-04-19 DIAGNOSIS — I10 Essential (primary) hypertension: Secondary | ICD-10-CM | POA: Diagnosis not present

## 2021-04-19 DIAGNOSIS — I25700 Atherosclerosis of coronary artery bypass graft(s), unspecified, with unstable angina pectoris: Secondary | ICD-10-CM | POA: Diagnosis not present

## 2021-04-19 DIAGNOSIS — I480 Paroxysmal atrial fibrillation: Secondary | ICD-10-CM

## 2021-04-19 DIAGNOSIS — E785 Hyperlipidemia, unspecified: Secondary | ICD-10-CM

## 2021-04-19 MED ORDER — AMLODIPINE BESYLATE 5 MG PO TABS: 5.0000 mg | ORAL_TABLET | Freq: Every day | ORAL | 3 refills | Status: DC

## 2021-04-19 MED ORDER — PANTOPRAZOLE SODIUM 40 MG PO TBEC
40.0000 mg | DELAYED_RELEASE_TABLET | Freq: Two times a day (BID) | ORAL | 3 refills | Status: DC
Start: 2021-04-19 — End: 2021-12-29

## 2021-04-19 NOTE — Patient Instructions (Signed)
Medication Instructions:   INCREASE Protonix to 40 mg 2 times a day  *If you need a refill on your cardiac medications before your next appointment, please call your pharmacy*  Lab Work: NONE ordered at this time of appointment   If you have labs (blood work) drawn today and your tests are completely normal, you will receive your results only by: Marland Kitchen MyChart Message (if you have MyChart) OR . A paper copy in the mail If you have any lab test that is abnormal or we need to change your treatment, we will call you to review the results.  Testing/Procedures: NONE ordered at this time of appointment   Follow-Up: At [facility], you and your health needs are our priority.  As part of our continuing mission to provide you with exceptional heart care, we have created designated Provider Care Teams.  These Care Teams include your primary Cardiologist (physician) and Advanced Practice Providers (APPs -  Physician Assistants and Nurse Practitioners) who all work together to provide you with the care you need, when you need it.  Your next appointment:   Follow up as scheduled-05/31/21@10 :40 AM   The format for your next appointment:   In Person  Provider:   Peter Martinique, MD  Other Instructions

## 2021-04-19 NOTE — Progress Notes (Signed)
Cardiology Office Note:    Date:  04/21/2021   ID:  [name], DOB 06-06-1946, MRN [id number]  PCP:  Ginger Organ., MD   [facility] Providers Cardiologist:  Peter Martinique, MD {   Referring MD: Ginger Organ., MD   Chief Complaint  Patient presents with  . Follow-up    Seen for Dr. Martinique    History of Present Illness:    [name] is a 75 y.o. male with a hx of CAD s/p CABG 12/2019 (LIMA to LAD, SVG to diagonal, SVG to OM1 and SVG to PDA), postop atrial fibrillation, hypertension, hyperlipidemia and DM2.  He had a STEMI in April 2021 that required stenting of OM 2 and distal left circumflex artery, SVG to OM and PDA was occluded, he did have 70% proximal RCA and a diffusely diseased PLA branch that was filled with left-to-right collaterals, this was managed medically.  Postprocedure, he was placed on aspirin and Plavix.  Anticoagulation therapy and amiodarone were discontinued as his A. fib only occurred after bypass surgery.  He was placed on Praluent in May 2021 for hyperlipidemia.  He had underwent another cardiac catheterization in March 2022, this demonstrated restenosis in the stent of 412, this was treated with a scoring balloon angioplasty, proximal RCA was also stented, LAD distal left circumflex artery stent was patent.  At the time he was followed up by Dr. Martinique on 02/25/2021, he has not had further chest discomfort.  Unfortunately patient went back to the hospital on 03/30/2021 with recurrent chest pain.  Serial troponin was only borderline elevated.  His symptom was concerning for unstable angina.  Repeat cardiac catheterization performed on 03/31/2021 showed 80% mid LAD, 100% mid LAD occlusion, 99% OM2 lesion treated with DES x2, EF 55 to 65%, widely patent distal left circumflex stent.  It is recommended he continue dual antiplatelet therapy indefinitely. During this hospitalization, Imdur was increased to 60 mg daily.  Beta-blocker was reduced down to 37.5  mg twice a day due to bradycardia.  Patient presents today for follow-up.  Metoprolol has been reduced to 25 mg twice a day based on the medication list.  He denies any chest discomfort or shortness of breath.  Heart rate was 50 bpm.  Immediately after the discharge, he did have some fatigue, this has improved.  He has no lower extremity edema, orthopnea or PND.  His wife has to go to [facility] to work several days a week, however she layout his medication on the counter for him to take.  He has been compliant with aspirin and Plavix.  He does have acid reflux problem, he used to use Prevacid, the current Protonix does not work very well for him.  I recommend he increase Protonix to 40 mg twice a day as a trial, if it still does not work can potentially consider alternative.  Although I would avoid Prilosec due to his potential interaction with Plavix.  He was also supposed to have a basic metabolic panel today, however he is due to see his PCP next week we will obtain blood panel on him.  I will defer to his PCP to obtain basic metabolic panel.  He is having problems with frequent urination and problem and emptying bladder, this is consistent with BPH.  He will see his PCP first, if medication cannot control this, he may need a urology referral.  Otherwise he can see Dr. Martinique next month.   Past Medical History:  Diagnosis Date  . Arthritis   . Arthritis   . Atrial fibrillation ([place])   . BPH (benign prostatic hypertrophy)   . CAD (coronary artery disease)    stents placed  . Diabetes mellitus   . Diverticulosis   . Dyspnea    when exerting self  . Dysrhythmia    "extra beats"  . Esophageal stricture   . GERD (gastroesophageal reflux disease)   . Headache(784.0)   . Hemorrhoids   . Hernia, inguinal, right   . Hiatal hernia   . Hyperlipidemia   . Hypertension   . Pneumonia   . Staph infection    Left ankle  . 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 Urinary hesitancy     Past  Surgical History:  Procedure Laterality Date  . ANKLE ARTHROPLASTY  2006  . CARDIOVASCULAR STRESS TEST  01/22/2009   EF 51%, NO ISCHEMIA  . CARDIOVASCULAR STRESS TEST    . COLONOSCOPY    . CORONARY ANGIOPLASTY WITH STENT PLACEMENT  03/2005  . CORONARY ARTERY BYPASS GRAFT N/A 12/20/2019   Procedure: CORONARY ARTERY BYPASS GRAFTING (CABG) times four, using left internal mammary artery and left greater saphenous vein;  Surgeon: Ivin Poot, MD;  Location: [place];  Service: Open Heart Surgery;  Laterality: N/A;  . CORONARY BALLOON ANGIOPLASTY N/A 02/09/2021   Procedure: CORONARY BALLOON ANGIOPLASTY;  Surgeon: Martinique, Peter M, MD;  Location: [place] [facility];  Service: Cardiovascular;  Laterality: N/A;  . CORONARY STENT INTERVENTION N/A 03/16/2020   Procedure: CORONARY STENT INTERVENTION;  Surgeon: Martinique, Peter M, MD;  Location: [place] [facility];  Service: Cardiovascular;  Laterality: N/A;  . CORONARY STENT INTERVENTION N/A 02/09/2021   Procedure: CORONARY STENT INTERVENTION;  Surgeon: Martinique, Peter M, MD;  Location: [place] [facility];  Service: Cardiovascular;  Laterality: N/A;  . CORONARY STENT INTERVENTION N/A 03/31/2021   Procedure: CORONARY STENT INTERVENTION;  Surgeon: Wellington Hampshire, MD;  Location: [place] [facility];  Service: Cardiovascular;  Laterality: N/A;  . CORONARY STENT PLACEMENT  03/10/2005   SUCCESSFUL STENTING OF THE MID AND CRUX OF THE RCA  . CORONARY/GRAFT ACUTE MI REVASCULARIZATION N/A 03/16/2020   Procedure: Coronary/Graft Acute MI Revascularization;  Surgeon: Martinique, Peter M, MD;  Location: [place] [facility];  Service: Cardiovascular;  Laterality: N/A;  . ESOPHAGEAL DILATION     multiple procedures  . HERNIA REPAIR  19/62/2297   umbilical hernia  . INGUINAL HERNIA REPAIR  11/28/2011   Procedure: HERNIA REPAIR INGUINAL ADULT;  Surgeon: Imogene Burn. Georgette Dover, MD;  Location: [place] [facility];  Service: General;  Laterality: Right;  right inguinal hernia repair with mesh  . KNEE SURGERY      Right  . LEFT HEART CATH AND CORONARY ANGIOGRAPHY N/A 12/03/2019   Procedure: LEFT HEART CATH AND CORONARY ANGIOGRAPHY;  Surgeon: Jettie Booze, MD;  Location: [place] [facility];  Service: Cardiovascular;  Laterality: N/A;  . LEFT HEART CATH AND CORS/GRAFTS ANGIOGRAPHY N/A 03/16/2020   Procedure: LEFT HEART CATH AND CORS/GRAFTS ANGIOGRAPHY;  Surgeon: Martinique, Peter M, MD;  Location: [place] [facility];  Service: Cardiovascular;  Laterality: N/A;  . LEFT HEART CATH AND CORS/GRAFTS ANGIOGRAPHY N/A 02/09/2021   Procedure: LEFT HEART CATH AND CORS/GRAFTS ANGIOGRAPHY;  Surgeon: Martinique, Peter M, MD;  Location: [place] [facility];  Service: Cardiovascular;  Laterality: N/A;  . LEFT HEART CATH AND CORS/GRAFTS ANGIOGRAPHY N/A 03/31/2021   Procedure: LEFT HEART CATH AND CORS/GRAFTS ANGIOGRAPHY;  Surgeon: Wellington Hampshire, MD;  Location: [place] [facility];  Service: Cardiovascular;  Laterality: N/A;  . POLYPECTOMY    . TEE WITHOUT CARDIOVERSION N/A 12/20/2019   Procedure: TRANSESOPHAGEAL ECHOCARDIOGRAM (TEE);  Surgeon: Prescott Gum, Collier Salina, MD;  Location: [place];  Service: Open Heart Surgery;  Laterality: N/A;  . TONSILLECTOMY  1958  . UMBILICAL HERNIA REPAIR  11/2006  . US ECHOCARDIOGRAPHY      Current Medications: No outpatient medications have been marked as taking for the 04/19/21 encounter (Office Visit) with Almyra Deforest, PA.     Allergies:   Kiwi extract, Statins, Bismuth subsalicylate, Codeine, Morphine and related, Tape, and Altace [ramipril]   Social History   Socioeconomic History  . Marital status: Married    Spouse name: Not on file  . Number of children: 3  . Years of education: Not on file  . Highest education level: Not on file  Occupational History  . Occupation: Retired  Tobacco Use  . Smoking status: Former Smoker    Packs/day: 1.00    Years: 22.00    Pack years: 22.00    Types: Cigarettes, Pipe    Quit date: 12/17/2014    Years since quitting: 6.3  . Smokeless tobacco:  Never Used  Substance and Sexual Activity  . Alcohol use: No    Alcohol/week: 0.0 standard drinks  . Drug use: No  . Sexual activity: Not on file  Other Topics Concern  . Not on file  Social History Narrative  . Not on file   Social Determinants of Health   Financial Resource Strain: Not on file  Food Insecurity: Not on file  Transportation Needs: Not on file  Physical Activity: Not on file  Stress: Not on file  Social Connections: Not on file     Family History: The patient's family history includes Cancer in his paternal grandmother; Heart disease in his brother, maternal grandfather, mother, and sister; Ovarian cancer in his paternal grandmother. There is no history of Colon cancer, Esophageal cancer, or Stomach cancer.  ROS:   Please see the history of present illness.     All other systems reviewed and are negative.  EKGs/Labs/Other Studies Reviewed:    The following studies were reviewed today:  Cath 03/31/2021  Mid LAD-1 lesion is 80% stenosed.  Prox LAD lesion is 25% stenosed.  Mid LAD-2 lesion is 100% stenosed.  2nd Mrg-2 lesion is 99% stenosed.  RPAV lesion is 99% stenosed.  Mid RCA lesion is 30% stenosed.  Dist RCA lesion is 30% stenosed.  Previously placed Prox RCA drug eluting stent is widely patent.  The left ventricular systolic function is normal.  LV end diastolic pressure is mildly elevated.  The left ventricular ejection fraction is 55-65% by visual estimate.  Previously placed Dist Cx stent (unknown type) is widely patent.  Mid Cx to Dist Cx lesion is 30% stenosed.  Post intervention, there is a 0% residual stenosis.  A drug-eluting stent was successfully placed using a SYNERGY XD 2.50X12.  2nd Mrg-1 lesion is 90% stenosed.  Post intervention, there is a 0% residual stenosis.  A drug-eluting stent was successfully placed using a SYNERGY XD 2.75X24.  Lat 2nd Mrg lesion is 50% stenosed.   1.  Significant underlying three-vessel  coronary artery disease with patent LIMA to LAD and patent SVG to diagonal.  Chronically occluded SVG to OM 2 and SVG to right PDA.  Patent RCA stents with mild in-stent restenosis in the mid and distal stents.  Patent distal left circumflex stent with minimal  restenosis.  Severe in-stent restenosis in the proximal as well as distal stent in a large OM2. 2.  Normal LV systolic function mildly elevated left ventricular end-diastolic pressure. 3.  Successful OCT guided PCI and drug-eluting stent placement to the distal and proximal OM2.  The distal stent did not cover the whole length of the previously placed stent as I was concerned about small vessel size distally and in order not to jail the inferior branch which was diseased.  I elected to restent with different platform given the quick restenosis after recent cath and balloon angioplasty.  Recommendations: Continue dual antiplatelet therapy indefinitely if possible. Aggressive treatment of risk factors. Hydrate overnight given underlying chronic kidney disease.   EKG:  EKG is ordered today.  The ekg ordered today demonstrates normal sinus rhythm with T wave inversion and Q waves in the inferior leads.  Recent Labs: 03/26/2021: ALT 12 03/31/2021: Magnesium 2.0 04/01/2021: BUN 18; Creatinine, Ser 1.28; Hemoglobin 11.7; Platelets 156; Potassium 4.1; Sodium 138  Recent Lipid Panel    Component Value Date/Time   CHOL 109 03/31/2021 0240   CHOL 122 02/01/2021 1136   TRIG 143 03/31/2021 0240   HDL 46 03/31/2021 0240   HDL 53 02/01/2021 1136   CHOLHDL 2.4 03/31/2021 0240   VLDL 29 03/31/2021 0240   LDLCALC 34 03/31/2021 0240   LDLCALC 50 02/01/2021 1136     Risk Assessment/Calculations:    CHA2DS2-VASc Score = 5  This indicates a 7.2% annual risk of stroke. The patient's score is based upon: CHF History: No HTN History: Yes Diabetes History: Yes Stroke History: No Vascular Disease History: Yes Age Score: 2 Gender Score: 0       Physical Exam:    VS:  BP 130/70   Pulse (!) 50   Ht 5\' 11"  (1.803 m)   Wt 158 lb 12.8 oz (72 kg)   SpO2 95%   BMI 22.15 kg/m     Wt Readings from Last 3 Encounters:  04/19/21 158 lb 12.8 oz (72 kg)  04/01/21 157 lb 1.6 oz (71.3 kg)  03/26/21 161 lb 6 oz (73.2 kg)     GEN:  Well nourished, well developed in no acute distress HEENT: Normal NECK: No JVD; No carotid bruits LYMPHATICS: No lymphadenopathy CARDIAC: RRR, no murmurs, rubs, gallops RESPIRATORY:  Clear to auscultation without rales, wheezing or rhonchi  ABDOMEN: Soft, non-tender, non-distended MUSCULOSKELETAL:  No edema; No deformity  SKIN: Warm and dry NEUROLOGIC:  Alert and oriented x 3 PSYCHIATRIC:  Normal affect   ASSESSMENT:    1. Coronary artery disease involving coronary bypass graft of native heart with unstable angina pectoris ([place])   2. PAF (paroxysmal atrial fibrillation) ([place])   3. Essential hypertension   4. Hyperlipidemia LDL goal <70   5. Controlled type 2 diabetes mellitus without complication, without long-term current use of insulin (HCC)    PLAN:    In order of problems listed above:  1. CAD s/p CABG: Recently underwent DES x2 to distal and proximal OM 2.  He denies any further chest discomfort.  Continue aspirin and Plavix.  Imdur was recently increased.  2. Postop atrial fibrillation: Occurred after the previous bypass surgery.  Not on any anticoagulation therapy given lack of recurrence  3. Hypertension: Blood pressure stable  4. Hyperlipidemia: On Praluent  5. DM2: On Jardiance        Medication Adjustments/Labs and Tests Ordered: Current medicines are reviewed at length with the patient today.  Concerns regarding medicines are  outlined above.  Orders Placed This Encounter  Procedures  . EKG 12-Lead   Meds ordered this encounter  Medications  . pantoprazole (PROTONIX) 40 MG tablet    Sig: Take 1 tablet (40 mg total) by mouth 2 (two) times daily.    Dispense:  180  tablet    Refill:  3  . amLODipine (NORVASC) 5 MG tablet    Sig: Take 1 tablet (5 mg total) by mouth daily.    Dispense:  90 tablet    Refill:  3    Patient Instructions  Medication Instructions:   INCREASE Protonix to 40 mg 2 times a day  *If you need a refill on your cardiac medications before your next appointment, please call your pharmacy*  Lab Work: NONE ordered at this time of appointment   If you have labs (blood work) drawn today and your tests are completely normal, you will receive your results only by: Marland Kitchen MyChart Message (if you have MyChart) OR . A paper copy in the mail If you have any lab test that is abnormal or we need to change your treatment, we will call you to review the results.  Testing/Procedures: NONE ordered at this time of appointment   Follow-Up: At [facility], you and your health needs are our priority.  As part of our continuing mission to provide you with exceptional heart care, we have created designated Provider Care Teams.  These Care Teams include your primary Cardiologist (physician) and Advanced Practice Providers (APPs -  Physician Assistants and Nurse Practitioners) who all work together to provide you with the care you need, when you need it.  Your next appointment:   Follow up as scheduled-05/31/21@10 :40 AM   The format for your next appointment:   In Person  Provider:   Peter Martinique, MD  Other Instructions      Signed, Almyra Deforest, [place]  04/21/2021 10:51 PM    [place]

## 2021-04-21 ENCOUNTER — Encounter: Payer: Self-pay | Admitting: Physician Assistant

## 2021-05-10 NOTE — Progress Notes (Signed)
Cardiology Office Note:    Date:  05/24/2021   ID:  [name], DOB January 16, 1946, MRN [id number]  PCP:  Ginger Organ., MD   [facility] Providers Cardiologist:  Tanis Hensarling Martinique, MD {   Referring MD: Ginger Organ., MD   Chief Complaint  Patient presents with   Coronary Artery Disease    History of Present Illness:    [name] is a 75 y.o. male with a hx of CAD s/p CABG 12/2019 (LIMA to LAD, SVG to diagonal, SVG to OM1 and SVG to PDA), postop atrial fibrillation, hypertension, hyperlipidemia and DM2.  He had a STEMI in April 2021 that required stenting of OM 2 and distal left circumflex artery, SVG to OM and PDA was occluded, he did have 70% proximal RCA and a diffusely diseased PLA branch that was filled with left-to-right collaterals, this was managed medically.  Postprocedure, he was placed on aspirin and Plavix.  Anticoagulation therapy and amiodarone were discontinued as his A. fib only occurred after bypass surgery.  He was placed on Praluent in May 2021 for hyperlipidemia.  He had underwent another cardiac catheterization in March 2022, this demonstrated restenosis in the stent of the OM this was treated with a scoring balloon angioplasty, proximal RCA was also stented, LAD distal left circumflex artery stent was patent.  Unfortunately patient went back to the hospital on 03/30/2021 with recurrent chest pain.  Serial troponin was only borderline elevated.  His symptom was concerning for unstable angina.  Repeat cardiac catheterization performed on 03/31/2021 showed 80% mid LAD, 100% mid LAD occlusion, 99% OM2 lesion treated with DES x2, EF 55 to 65%, widely patent distal left circumflex stent.  It is recommended he continue dual antiplatelet therapy indefinitely. During this hospitalization, Imdur was increased to 60 mg daily.  Beta-blocker was reduced down to 25 mg twice a day due to bradycardia. When seen back by Almyra Deforest PA-C on 04/19/21 he was doing well.   On follow  up today he is doing very well. He denies any recurrent chest pain. No dyspnea or palpitations. Thinks his memory is better the last month. Was recently seen by Neurology for his memory loss and MRI is planned as well as Neurocognitive testing. He is very active and push mows up to 2 acres. No concerns today.   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   Urinary hesitancy     Past Surgical History:  Procedure Laterality Date   ANKLE ARTHROPLASTY  2006   CARDIOVASCULAR STRESS TEST  01/22/2009   EF 51%, NO ISCHEMIA   CARDIOVASCULAR STRESS TEST     COLONOSCOPY     CORONARY ANGIOPLASTY WITH STENT PLACEMENT  03/2005   CORONARY ARTERY BYPASS GRAFT N/A 12/20/2019   Procedure: CORONARY ARTERY BYPASS GRAFTING (CABG) times four, using  left internal mammary artery and left greater saphenous vein;  Surgeon: Ivin Poot, MD;  Location: [place];  Service: Open Heart Surgery;  Laterality: N/A;   CORONARY BALLOON ANGIOPLASTY N/A 02/09/2021   Procedure: CORONARY BALLOON ANGIOPLASTY;  Surgeon: Martinique, Stpehen Petitjean M, MD;  Location: [place] [facility];  Service: Cardiovascular;  Laterality: N/A;   CORONARY STENT INTERVENTION N/A 03/16/2020   Procedure: CORONARY STENT INTERVENTION;  Surgeon: Martinique, Lisa Blakeman M, MD;  Location: [place] [facility];  Service: Cardiovascular;  Laterality: N/A;   CORONARY STENT INTERVENTION N/A 02/09/2021   Procedure: CORONARY STENT INTERVENTION;  Surgeon: Martinique, Nazim Kadlec M, MD;   Location: [place] [facility];  Service: Cardiovascular;  Laterality: N/A;   CORONARY STENT INTERVENTION N/A 03/31/2021   Procedure: CORONARY STENT INTERVENTION;  Surgeon: Wellington Hampshire, MD;  Location: [place] [facility];  Service: Cardiovascular;  Laterality: N/A;   CORONARY STENT PLACEMENT  03/10/2005   SUCCESSFUL STENTING OF THE MID AND CRUX OF THE RCA   CORONARY/GRAFT ACUTE MI REVASCULARIZATION N/A 03/16/2020   Procedure: Coronary/Graft Acute MI Revascularization;  Surgeon: Martinique, Jariel Drost M, MD;  Location: [place] [facility];  Service: Cardiovascular;  Laterality: N/A;   ESOPHAGEAL DILATION     multiple procedures   HERNIA REPAIR  44/02/4741   umbilical hernia   INGUINAL HERNIA REPAIR  11/28/2011   Procedure: HERNIA REPAIR INGUINAL ADULT;  Surgeon: Imogene Burn. Georgette Dover, MD;  Location: [place];  Service: General;  Laterality: Right;  right inguinal hernia repair with mesh   KNEE SURGERY     Right   LEFT HEART CATH AND CORONARY ANGIOGRAPHY N/A 12/03/2019   Procedure: LEFT HEART CATH AND CORONARY ANGIOGRAPHY;  Surgeon: Jettie Booze, MD;  Location: [place] [facility];  Service: Cardiovascular;  Laterality: N/A;   LEFT HEART CATH AND CORS/GRAFTS ANGIOGRAPHY N/A 03/16/2020   Procedure: LEFT HEART CATH AND CORS/GRAFTS ANGIOGRAPHY;  Surgeon: Martinique, Loreli Debruler M, MD;  Location: [place] [facility];  Service: Cardiovascular;  Laterality: N/A;   LEFT HEART CATH AND CORS/GRAFTS ANGIOGRAPHY N/A 02/09/2021   Procedure: LEFT HEART CATH AND CORS/GRAFTS ANGIOGRAPHY;  Surgeon: Martinique, Ivanka Kirshner M, MD;  Location: [place] [facility];  Service: Cardiovascular;  Laterality: N/A;   LEFT HEART CATH AND CORS/GRAFTS ANGIOGRAPHY N/A 03/31/2021   Procedure: LEFT HEART CATH AND CORS/GRAFTS ANGIOGRAPHY;  Surgeon: Wellington Hampshire, MD;  Location: [place] [facility];  Service: Cardiovascular;  Laterality: N/A;   POLYPECTOMY     TEE WITHOUT CARDIOVERSION N/A 12/20/2019   Procedure: TRANSESOPHAGEAL ECHOCARDIOGRAM (TEE);  Surgeon: Prescott Gum, Collier Salina, MD;  Location: [place];  Service: Open Heart Surgery;  Laterality: N/A;   TONSILLECTOMY  [id number]   UMBILICAL HERNIA REPAIR  11/2006   US ECHOCARDIOGRAPHY      Current Medications: Current Meds  Medication Sig   acetaminophen (TYLENOL) 650 MG CR tablet Take 1,300 mg by mouth every 8 (eight) hours as needed for pain.   amLODipine (NORVASC) 5 MG tablet Take 1 tablet (5 mg total) by mouth daily.   aspirin EC 81 MG tablet Take 81 mg by mouth in the morning.   B Complex-C (B-COMPLEX WITH VITAMIN C) tablet Take 1 tablet by mouth daily with breakfast.    Calcium-Magnesium-Zinc (CAL-MAG-ZINC PO) Take 1 tablet by mouth daily with lunch.   cetirizine (ZYRTEC) 10 MG tablet Take 10 mg by mouth in the morning.   clopidogrel (PLAVIX) 75 MG tablet Take 1 tablet (75 mg total) by mouth daily with breakfast.  empagliflozin (JARDIANCE) 25 MG TABS tablet Take 25 mg by mouth in the morning.   fluticasone (FLONASE) 50 MCG/ACT nasal spray Place 2 sprays into both nostrils daily as needed for allergies.   isosorbide mononitrate (IMDUR) 60 MG 24 hr tablet Take 1 tablet (60 mg total) by mouth daily.   metoprolol tartrate (LOPRESSOR) 25 MG tablet Take 1 tablet (25 mg total) by mouth 2 (two) times daily.   nitroGLYCERIN (NITROSTAT) 0.4 MG SL tablet Place 1 tablet (0.4 mg total) under the tongue every 5 (five) minutes as needed. (Patient taking differently: Place 0.4 mg under the tongue every 5 (five) minutes as needed for chest pain.)   OneTouch Delica Lancets 62X MISC 1 each by Other route daily.   ONETOUCH VERIO test strip 1 each by Other route daily.   pantoprazole (PROTONIX) 40 MG tablet Take 1 tablet (40 mg total) by mouth 2 (two) times daily.   PRALUENT 150 MG/ML SOAJ INJECT 150MG  INTO THE SKIN EVERY 14 DAYS (Patient taking differently: Inject 150 mg into the skin every 14 (fourteen) days.)   Tamsulosin HCl (FLOMAX) 0.4 MG CAPS Take 0.4 mg by mouth at bedtime.     Allergies:   Kiwi extract, Statins,  Bismuth subsalicylate, Codeine, Morphine and related, Tape, and Altace [ramipril]   Social History   Socioeconomic History   Marital status: Married    Spouse name: Not on file   Number of children: 3   Years of education: Not on file   Highest education level: Not on file  Occupational History   Occupation: Retired  Tobacco Use   Smoking status: Former    Packs/day: 1.00    Years: 22.00    Pack years: 22.00    Types: Cigarettes, Pipe    Quit date: 12/17/2014    Years since quitting: 6.4   Smokeless tobacco: Never  Substance and Sexual Activity   Alcohol use: No    Alcohol/week: 0.0 standard drinks   Drug use: No   Sexual activity: Not on file  Other Topics Concern   Not on file  Social History Narrative   Right handed   Drinks caffeine   One story home   Social Determinants of Health   Financial Resource Strain: Not on file  Food Insecurity: Not on file  Transportation Needs: Not on file  Physical Activity: Not on file  Stress: Not on file  Social Connections: Not on file     Family History: The patient's family history includes Cancer in his paternal grandmother; Heart disease in his brother, maternal grandfather, mother, and sister; Ovarian cancer in his paternal grandmother. There is no history of Colon cancer, Esophageal cancer, or Stomach cancer.  ROS:   Please see the history of present illness.     All other systems reviewed and are negative.  EKGs/Labs/Other Studies Reviewed:    The following studies were reviewed today:  Cath 03/31/2021 Mid LAD-1 lesion is 80% stenosed. Prox LAD lesion is 25% stenosed. Mid LAD-2 lesion is 100% stenosed. 2nd Mrg-2 lesion is 99% stenosed. RPAV lesion is 99% stenosed. Mid RCA lesion is 30% stenosed. Dist RCA lesion is 30% stenosed. Previously placed Prox RCA drug eluting stent is widely patent. The left ventricular systolic function is normal. LV end diastolic pressure is mildly elevated. The left ventricular  ejection fraction is 55-65% by visual estimate. Previously placed Dist Cx stent (unknown type) is widely patent. Mid Cx to Dist Cx lesion is 30% stenosed. Post intervention, there is a 0% residual  stenosis. A drug-eluting stent was successfully placed using a SYNERGY XD 2.50X12. 2nd Mrg-1 lesion is 90% stenosed. Post intervention, there is a 0% residual stenosis. A drug-eluting stent was successfully placed using a SYNERGY XD 2.75X24. Lat 2nd Mrg lesion is 50% stenosed.   1.  Significant underlying three-vessel coronary artery disease with patent LIMA to LAD and patent SVG to diagonal.  Chronically occluded SVG to OM 2 and SVG to right PDA.  Patent RCA stents with mild in-stent restenosis in the mid and distal stents.  Patent distal left circumflex stent with minimal restenosis.  Severe in-stent restenosis in the proximal as well as distal stent in a large OM2. 2.  Normal LV systolic function mildly elevated left ventricular end-diastolic pressure. 3.  Successful OCT guided PCI and drug-eluting stent placement to the distal and proximal OM2.  The distal stent did not cover the whole length of the previously placed stent as I was concerned about small vessel size distally and in order not to jail the inferior branch which was diseased.  I elected to restent with different platform given the quick restenosis after recent cath and balloon angioplasty.   Recommendations: Continue dual antiplatelet therapy indefinitely if possible. Aggressive treatment of risk factors. Hydrate overnight given underlying chronic kidney disease.   EKG:  EKG is not ordered today.    Recent Labs: 03/26/2021: ALT 12 03/31/2021: Magnesium 2.0 04/01/2021: BUN 18; Creatinine, Ser 1.28; Hemoglobin 11.7; Platelets 156; Potassium 4.1; Sodium 138 05/21/2021: TSH 2.03  Recent Lipid Panel    Component Value Date/Time   CHOL 109 03/31/2021 0240   CHOL 122 02/01/2021 1136   TRIG 143 03/31/2021 0240   HDL 46 03/31/2021 0240    HDL 53 02/01/2021 1136   CHOLHDL 2.4 03/31/2021 0240   VLDL 29 03/31/2021 0240   LDLCALC 34 03/31/2021 0240   LDLCALC 50 02/01/2021 1136     Risk Assessment/Calculations:    CHA2DS2-VASc Score = 5  This indicates a 7.2% annual risk of stroke. The patient's score is based upon: CHF History: No HTN History: Yes Diabetes History: Yes Stroke History: No Vascular Disease History: Yes Age Score: 2 Gender Score: 0     Physical Exam:    VS:  BP (!) 120/52 (BP Location: Left Arm, Patient Position: Sitting, Cuff Size: Normal)   Pulse (!) 56   Ht 5' 9.5" (1.765 m)   Wt 158 lb 9.6 oz (71.9 kg)   BMI 23.09 kg/m     Wt Readings from Last 3 Encounters:  05/24/21 158 lb 9.6 oz (71.9 kg)  05/21/21 155 lb (70.3 kg)  04/19/21 158 lb 12.8 oz (72 kg)     GEN:  Well nourished, well developed in no acute distress HEENT: Normal NECK: No JVD; No carotid bruits LYMPHATICS: No lymphadenopathy CARDIAC: RRR, no murmurs, rubs, gallops RESPIRATORY:  Clear to auscultation without rales, wheezing or rhonchi  ABDOMEN: Soft, non-tender, non-distended MUSCULOSKELETAL:  No edema; No deformity  SKIN: Warm and dry NEUROLOGIC:  Alert and oriented x 3 PSYCHIATRIC:  Normal affect   ASSESSMENT:    1. Coronary artery disease of bypass graft of native heart with stable angina pectoris ([place])   2. PAF (paroxysmal atrial fibrillation) ([place])   3. Hypercholesterolemia   4. S/P CABG x 4   5. Essential hypertension    PLAN:    In order of problems listed above:  CAD s/p CABG 12/2019 with subsequent PCI/DES to OM2 x2 and LCx x1 03/16/20 with cardiac cath showing occlusion of  SVG to OM1 and SVG to PDA: patent LIMA to LAD and SVG to diagonal, s/p scoring balloon PCI of the OM2 for restenosis on 02/09/21. PCI of the proximal RCA with DES.  Then underwent DES x2 to distal and proximal OM 2 in late April for restenosis.   He denies any further chest discomfort.  Continue aspirin and Plavix indefinitely.  On  Imdur, amlodipine and beta blocker.   Postop atrial fibrillation: Occurred after the previous bypass surgery.  No recurrence  Hypertension: Blood pressure is well controlled.  Hyperlipidemia: On Praluent with excellent control  DM2: On Jardiance  6.   Memory loss. Neuro evaluation in process.  I will follow up in 6 months.   Signed, Jolina Symonds Martinique, MD  05/24/2021 11:12 AM    [place]

## 2021-05-21 ENCOUNTER — Encounter: Payer: Self-pay | Admitting: Physician Assistant

## 2021-05-21 ENCOUNTER — Ambulatory Visit (INDEPENDENT_AMBULATORY_CARE_PROVIDER_SITE_OTHER): Payer: BC Managed Care – PPO | Admitting: Physician Assistant

## 2021-05-21 ENCOUNTER — Other Ambulatory Visit: Payer: Self-pay

## 2021-05-21 ENCOUNTER — Ambulatory Visit: Payer: Self-pay | Admitting: Neurology

## 2021-05-21 ENCOUNTER — Other Ambulatory Visit (INDEPENDENT_AMBULATORY_CARE_PROVIDER_SITE_OTHER): Payer: BC Managed Care – PPO

## 2021-05-21 VITALS — BP 129/65 | HR 97 | Resp 20 | Ht 71.0 in | Wt 155.0 lb

## 2021-05-21 DIAGNOSIS — R413 Other amnesia: Secondary | ICD-10-CM

## 2021-05-21 LAB — VITAMIN B12: Vitamin B-12: 393 pg/mL (ref 211–911)

## 2021-05-21 LAB — TSH: TSH: 2.03 u[IU]/mL (ref 0.35–4.50)

## 2021-05-21 NOTE — Progress Notes (Signed)
Assessment/Plan:   [name] is a 75 y.o. year old male with risk factors including hypertension, hyperlipidemia, chronic bradycardia, DM2, CAD s/p CABG p STEMI, PAF, seen today for evaluation of memory loss.  MoCA today is 20/30 with deficiency in delayed recall of 0/5, abstraction, naming maximum number of variants (6/11), very mild visuospatial-executive.  Recommendations are as follows   Memory Loss  MRI of the brain to evaluate for bleeding, brain size and other abnormalities Neurocognitive testing to further determine what causes memory changes including sleep, stress, anxiety depression B12 and TSH  Discussed safety both in and out of the home.  Discussed the importance of regular daily schedule with inclusion of crossword puzzles to maintain brain function.  Stay active at least 30 minutes at least 3 times a week.  Naps should be scheduled and should be no longer than 60 minutes and should not occur after 2 PM.  Mediterranean diet is recommended  Folllow up once results above are available   Subjective:    The patient is seen in neurologic consultation at the request of Martinique, Peter M, MD for the evaluation of memory.  The patient is accompanied by wife who supplements the history.  He is a 75 year old male who had memory issues for about 2 years, although he states that after his first MI and CABG in 2021 he began to notice "little things, such as losing more frequently that he is, or coming up with the name of an object or another name".  After his second MRI, his symptoms appeared to worsen, with significant difficulty in recalling, although "stress makes it worse ".  His wife reports that when they are due to being alone, these memory issues are less evident, "he could almost make it to 100% ".  However, while they are surrounded by a crowd, he becomes easily distracted, and he is unable to recall, especially if some new information would have been presented a few  minutes earlier, and trying to remember them 5 minutes later.  He reports that he may have undiagnosed ADHD.  He states that when growing up, the teachers would always reprimanded him for not paying attention, dropping out of  High School,  because he could not concentrate.  However, at home he could read the [employer], "tons of magazines and books ", and also learning to do a lot of manual activities.   He held numerous "hands-on jobs, including being a Engineer, drilling, Games developer, Dealer, working in the [employer], being a Actuary ".  The wife says that he would become bored with one activity, and try something new.  Currently, he likes to do yard work, cutting grass "2 acres of it ".  He also likes to play [employer], Chief Strategy Officer, board games.   His mood is overall good, without depression or irritability.  He states that he did very well during the pandemic, was not feeling lonely.  He sleeps about 6 hours at night, and always had vivid dreams, no changes in the pattern.  He denies sleepwalking. He is able to do all his activities of daily living, such as bathing, dressing up, taking his medications without missing doses, or driving without getting lost.  He drives with a GPS but not afraid of getting lost, driving long distances without any issues.  He does his own finances without missing any payments.  "In fact, I had to remind my wife to pay on  time ".  He does not  cook.  He used to have headaches as a youngster, but since moving from [place] to [place], the last 34 years he has not had any migraine headaches.   He denies any trauma to the head, double vision, dizziness, focal numbness or tingling, unilateral weakness or tremors.  He has a history of BPH, reporting frequent urination with residual, and has to be seen by urology soon.  He has an appointment with them.  He denies any constipation or diarrhea.  Denies any history of sleep apnea, alcohol, he has remote tobacco  history, quitting a 22 pack year in 2016.  Family history is negative for dementia. 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   CT head wo contrast 12/18/19 IMPRESSION: Mild chronic ischemic white matter disease. No acute intracranial abnormality seen.    Allergies  Allergen Reactions   Kiwi Extract Anaphylaxis and Other (See Comments)    Makes face red, breaks out into bumps on body, also   Statins Anaphylaxis and Other (See Comments)    Myalgias, also    Bismuth Subsalicylate Nausea And Vomiting   Codeine Nausea And Vomiting   Morphine And Related Nausea And Vomiting   Tape Other (See Comments)    "Tape pulls off the skin"- Coban and paper tape are tolerated   Altace [Ramipril] Rash and Other (See Comments)    All-over body rash    Current Outpatient Medications  Medication Instructions   acetaminophen (TYLENOL) 1,300 mg, Oral, Every 8 hours PRN   amLODipine (NORVASC) 5 mg, Oral, Daily   aspirin EC 81 mg, Oral, Every morning   B Complex-C (B-COMPLEX WITH VITAMIN C) tablet 1 tablet, Oral, Daily with breakfast   Calcium-Magnesium-Zinc (CAL-MAG-ZINC PO) 1 tablet, Oral, Daily with lunch   cetirizine (ZYRTEC) 10 mg, Oral, Every morning,     clopidogrel (PLAVIX) 75 mg, Oral, Daily with breakfast   empagliflozin (JARDIANCE) 25 mg, Oral, Every morning   fluticasone (FLONASE) 50 MCG/ACT nasal spray 2 sprays, Each Nare, Daily PRN,     isosorbide mononitrate (IMDUR) 60 mg, Oral, Daily   metoprolol tartrate (LOPRESSOR) 25 mg, Oral, 2 times daily   nitroGLYCERIN (NITROSTAT) 0.4 mg, Sublingual, Every 5 min PRN   OneTouch Delica Lancets 40X MISC 1 each, Other, Daily   ONETOUCH VERIO test strip 1 each, Other, Daily   pantoprazole (PROTONIX) 40 mg, Oral, 2 times daily   PRALUENT 150 MG/ML SOAJ INJECT 150MG  INTO THE SKIN EVERY 14 DAYS   tamsulosin (FLOMAX) 0.4 mg, Oral, Daily at bedtime,       VITALS:   Vitals:   05/21/21 1353  BP: 129/65  Pulse: 97  Resp: 20  SpO2: 99%   Weight: 155 lb (70.3 kg)  Height: 5\' 11"  (1.803 m)   Depression screen [facility] 2/9 05/11/2020  Decreased Interest 0  Down, Depressed, Hopeless 0  PHQ - 2 Score 0  Altered sleeping 0  Tired, decreased energy 0  Change in appetite 0  Feeling bad or failure about yourself  0  Trouble concentrating 0  Moving slowly or fidgety/restless 0  Suicidal thoughts 0  PHQ-9 Score 0  Difficult doing work/chores Not difficult at all    HEENT:  Normocephalic, atraumatic. The mucous membranes are moist. The superficial temporal arteries are without ropiness or tenderness. Cardiovascular: Regular rate and rhythm. Lungs: Clear to auscultation bilaterally. Neck: There are no carotid bruits noted bilaterally.  NEUROLOGICAL:  Orientation:   Montreal Cognitive Assessment  05/21/2021  Visuospatial/ Executive (0/5) 4  Naming (  0/3) 2  Attention: Read list of digits (0/2) 2  Attention: Read list of letters (0/1) 0  Attention: Serial 7 subtraction starting at 100 (0/3) 3  Language: Repeat phrase (0/2) 2  Language : Fluency (0/1) 0  Abstraction (0/2) 1  Delayed Recall (0/5) 0  Orientation (0/6) 5  Total 19  Adjusted Score (based on education) 20   Alert and oriented to person, place and time. No aphasia or dysarthria. Fund of knowledge is appropriate. Recent memory impaired, and remote memory intact.  Attention normal. Mild concentration impairment.   Able to name objects and repeat phrases. Delayed recall 0/5 Cranial nerves: There is good facial symmetry. Extraocular muscles are intact and visual fields are full to confrontational testing. Speech is fluent and clear. Soft palate rises symmetrically and there is no tongue deviation. Hearing is intact to conversational tone. Tone: Tone is good throughout. Sensation: Sensation is intact to light touch and pinprick throughout. Vibration is intact at the bilateral big toe.There is no extinction with double simultaneous stimulation. There is no sensory dermatomal  level identified. Coordination: The patient has no difficulty with RAM's or FNF bilaterally. Normal finger to nose  Motor: Strength is 5/5 in the bilateral upper and lower extremities. There is no pronator drift. There are no fasciculations noted. DTR's: Deep tendon reflexes are 2/4 at the bilateral biceps, triceps, brachioradialis, patella and achilles.  Plantar responses are downgoing bilaterally. Gait and Station: The patient is able to ambulate without difficulty.The patient is able to heel toe walk without any difficulty.The patient is able to ambulate in a tandem fashion. The patient is able to stand in the Romberg position.   CBC Latest Ref Rng & Units 04/01/2021 03/31/2021 03/30/2021  WBC 4.0 - 10.5 K/uL 6.4 7.7 4.4  Hemoglobin 13.0 - 17.0 g/dL 11.7(L) 11.8(L) 12.1(L)  Hematocrit 39.0 - 52.0 % 37.3(L) 38.7(L) 39.3  Platelets 150 - 400 K/uL 156 152 148(L)     CMP Latest Ref Rng & Units 04/01/2021 03/31/2021 03/30/2021  Glucose 70 - 99 mg/dL 98 152(H) 159(H)  BUN 8 - 23 mg/dL 18 20 20   Creatinine 0.61 - 1.24 mg/dL 1.28(H) 1.23 1.26(H)  Sodium 135 - 145 mmol/L 138 140 140  Potassium 3.5 - 5.1 mmol/L 4.1 4.4 4.1  Chloride 98 - 111 mmol/L 104 102 105  CO2 22 - 32 mmol/L 24 28 28   Calcium 8.9 - 10.3 mg/dL 8.4(L) 8.7(L) 8.7(L)  Total Protein 6.5 - 8.1 g/dL - - -  Total Bilirubin 0.3 - 1.2 mg/dL - - -  Alkaline Phos 38 - 126 U/L - - -  AST 15 - 41 U/L - - -  ALT 0 - 44 U/L - - -      Thank you for allowing [place] the opportunity to participate in the care of this nice patient. Please do not hesitate to contact us for any questions or concerns.   Total time spent on today's visit was 60 minutes, including both face-to-face time and nonface-to-face time.  Time included that spent on review of records (prior notes available to me/labs/imaging if pertinent), discussing treatment and goals, answering patient's questions and coordinating care.  Cc:  Ginger Organ., MD  Sharene Butters 05/21/2021 3:35 PM

## 2021-05-21 NOTE — Patient Instructions (Signed)
It was a pleasure to see you today at our office.   Recommendations:  Neurocognitive evaluation at our office MRI of the brain, the office will call you to arrange you appointment Check B12 and TSH at the lab Follow up once the results of the above are available   RECOMMENDATIONS FOR ALL PATIENTS WITH MEMORY PROBLEMS: 1. Continue to exercise (Recommend 30 minutes of walking everyday, or 3 hours every week) 2. Increase social interactions - continue going to [place] and enjoy social gatherings with friends and family 3. Eat healthy, avoid fried foods and eat more fruits and vegetables 4. Maintain adequate blood pressure, blood sugar, and blood cholesterol level. Reducing the risk of stroke and cardiovascular disease also helps promoting better memory. 5. Avoid stressful situations. Live a simple life and avoid aggravations. Organize your time and prepare for the next day in anticipation. 6. Sleep well, avoid any interruptions of sleep and avoid any distractions in the bedroom that may interfere with adequate sleep quality 7. Avoid sugar, avoid sweets as there is a strong link between excessive sugar intake, diabetes, and cognitive impairment We discussed the Mediterranean diet, which has been shown to help patients reduce the risk of progressive memory disorders and reduces cardiovascular risk. This includes eating fish, eat fruits and green leafy vegetables, nuts like almonds and hazelnuts, walnuts, and also use olive oil. Avoid fast foods and fried foods as much as possible. Avoid sweets and sugar as sugar use has been linked to worsening of memory function.  There is always a concern of gradual progression of memory problems. If this is the case, then we may need to adjust level of care according to patient needs. Support, both to the patient and caregiver, should then be put into place.      You have been referred for a neuropsychological evaluation (i.e., evaluation of memory and thinking  abilities). Please bring someone with you to this appointment if possible, as it is helpful for the doctor to hear from both you and another adult who knows you well. Please bring eyeglasses and hearing aids if you wear them.    The evaluation will take approximately 3 hours and has two parts:   The first part is a clinical interview with the neuropsychologist (Dr. Melvyn Novas or Dr. Nicole Kindred). During the interview, the neuropsychologist will speak with you and the individual you brought to the appointment.    The second part of the evaluation is testing with the doctor's technician Hinton Dyer or Maudie Mercury). During the testing, the technician will ask you to remember different types of material, solve problems, and answer some questionnaires. Your family member will not be present for this portion of the evaluation.   Please note: We must reserve several hours of the neuropsychologist's time and the psychometrician's time for your evaluation appointment. As such, there is a No-Show fee of $100. If you are unable to attend any of your appointments, please contact our office as soon as possible to reschedule.    FALL PRECAUTIONS: Be cautious when walking. Scan the area for obstacles that may increase the risk of trips and falls. When getting up in the mornings, sit up at the edge of the bed for a few minutes before getting out of bed. Consider elevating the bed at the head end to avoid drop of blood pressure when getting up. Walk always in a well-lit room (use night lights in the walls). Avoid area rugs or power cords from appliances in the middle of the walkways.  Use a walker or a cane if necessary and consider physical therapy for balance exercise. Get your eyesight checked regularly.  FINANCIAL OVERSIGHT: Supervision, especially oversight when making financial decisions or transactions is also recommended.  HOME SAFETY: Consider the safety of the kitchen when operating appliances like stoves, microwave oven, and  blender. Consider having supervision and share cooking responsibilities until no longer able to participate in those. Accidents with firearms and other hazards in the house should be identified and addressed as well.   ABILITY TO BE LEFT ALONE: If patient is unable to contact 911 operator, consider using LifeLine, or when the need is there, arrange for someone to stay with patients. Smoking is a fire hazard, consider supervision or cessation. Risk of wandering should be assessed by caregiver and if detected at any point, supervision and safe proof recommendations should be instituted.  MEDICATION SUPERVISION: Inability to self-administer medication needs to be constantly addressed. Implement a mechanism to ensure safe administration of the medications.   DRIVING: Regarding driving, in patients with progressive memory problems, driving will be impaired. We advise to have someone else do the driving if trouble finding directions or if minor accidents are reported. Independent driving assessment is available to determine safety of driving.   If you are interested in the driving assessment, you can contact the following:  [employer] in [place]  [place] [phone] or [phone]    [place] refers to food and lifestyle choices that are based on the traditions of countries located on the [employer]. This way of eating has been shown to help prevent certain conditions and improve outcomes for people who have chronic diseases, like kidney disease and heart disease. What are tips for following this plan? Lifestyle  Cook and eat meals together with your family, when possible. Drink enough fluid to keep your urine clear or pale yellow. Be physically active every day. This includes: Aerobic exercise like running or swimming. Leisure  activities like gardening, walking, or housework. Get 7-8 hours of sleep each night. If recommended by your health care provider, drink red wine in moderation. This means 1 glass a day for nonpregnant women and 2 glasses a day for men. A glass of wine equals 5 oz (150 mL). Reading food labels  Check the serving size of packaged foods. For foods such as rice and pasta, the serving size refers to the amount of cooked product, not dry. Check the total fat in packaged foods. Avoid foods that have saturated fat or trans fats. Check the ingredients list for added sugars, such as corn syrup. Shopping  At the grocery store, buy most of your food from the areas near the walls of the store. This includes: Fresh fruits and vegetables (produce). Grains, beans, nuts, and seeds. Some of these may be available in unpackaged forms or large amounts (in bulk). Fresh seafood. Poultry and eggs. Low-fat dairy products. Buy whole ingredients instead of prepackaged foods. Buy fresh fruits and vegetables in-season from local farmers markets. Buy frozen fruits and vegetables in resealable bags. If you do not have access to quality fresh seafood, buy precooked frozen shrimp or canned fish, such as tuna, salmon, or sardines. Buy small amounts of raw or cooked vegetables, salads, or olives from the deli or salad bar at your store. Stock your pantry so you always have certain foods on hand, such as olive oil, canned tuna, canned tomatoes, rice,  pasta, and beans. Cooking  Cook foods with extra-virgin olive oil instead of using butter or other vegetable oils. Have meat as a side dish, and have vegetables or grains as your main dish. This means having meat in small portions or adding small amounts of meat to foods like pasta or stew. Use beans or vegetables instead of meat in common dishes like chili or lasagna. Experiment with different cooking methods. Try roasting or broiling vegetables instead of steaming or sauteing  them. Add frozen vegetables to soups, stews, pasta, or rice. Add nuts or seeds for added healthy fat at each meal. You can add these to yogurt, salads, or vegetable dishes. Marinate fish or vegetables using olive oil, lemon juice, garlic, and fresh herbs. Meal planning  Plan to eat 1 vegetarian meal one day each week. Try to work up to 2 vegetarian meals, if possible. Eat seafood 2 or more times a week. Have healthy snacks readily available, such as: Vegetable sticks with hummus. Greek yogurt. Fruit and nut trail mix. Eat balanced meals throughout the week. This includes: Fruit: 2-3 servings a day Vegetables: 4-5 servings a day Low-fat dairy: 2 servings a day Fish, poultry, or lean meat: 1 serving a day Beans and legumes: 2 or more servings a week Nuts and seeds: 1-2 servings a day Whole grains: 6-8 servings a day Extra-virgin olive oil: 3-4 servings a day Limit red meat and sweets to only a few servings a month What are my food choices? Mediterranean diet Recommended Grains: Whole-grain pasta. Brown rice. Bulgar wheat. Polenta. Couscous. Whole-wheat bread. [place]. Vegetables: Artichokes. Beets. Broccoli. Cabbage. Carrots. Eggplant. Green beans. Chard. Kale. Spinach. Onions. Leeks. Peas. Squash. Tomatoes. Peppers. Radishes. Fruits: Apples. Apricots. Avocado. Berries. Bananas. Cherries. Dates. Figs. Grapes. Lemons. Melon. Oranges. Peaches. Plums. Pomegranate. Meats and other protein foods: Beans. Almonds. Sunflower seeds. Pine nuts. Peanuts. [place]. Salmon. Scallops. Shrimp. [place]. Tilapia. Clams. Oysters. Eggs. Dairy: Low-fat milk. Cheese. Greek yogurt. Beverages: Water. Red wine. Herbal tea. Fats and oils: Extra virgin olive oil. Avocado oil. Grape seed oil. Sweets and desserts: [place] yogurt with honey. Baked apples. Poached pears. Trail mix. Seasoning and other foods: Basil. Cilantro. Coriander. Cumin. Mint. Parsley. Sage. Rosemary. Tarragon. Garlic. Oregano. Thyme. Pepper.  Balsalmic vinegar. Tahini. Hummus. Tomato sauce. Olives. Mushrooms. Limit these Grains: Prepackaged pasta or rice dishes. Prepackaged cereal with added sugar. Vegetables: Deep fried potatoes (french fries). Fruits: Fruit canned in syrup. Meats and other protein foods: Beef. Pork. Lamb. Poultry with skin. Hot dogs. Berniece Salines. Dairy: Ice cream. Sour cream. Whole milk. Beverages: Juice. Sugar-sweetened soft drinks. Beer. Liquor and spirits. Fats and oils: Butter. Canola oil. Vegetable oil. Beef fat (tallow). Lard. Sweets and desserts: Cookies. Cakes. Pies. Candy. Seasoning and other foods: Mayonnaise. Premade sauces and marinades. The items listed may not be a complete list. Talk with your dietitian about what dietary choices are right for you. Summary The Mediterranean diet includes both food and lifestyle choices. Eat a variety of fresh fruits and vegetables, beans, nuts, seeds, and whole grains. Limit the amount of red meat and sweets that you eat. Talk with your health care provider about whether it is safe for you to drink red wine in moderation. This means 1 glass a day for nonpregnant women and 2 glasses a day for men. A glass of wine equals 5 oz (150 mL). This information is not intended to replace advice given to you by your health care provider. Make sure you discuss any questions you have with your health care  provider. Document Released: 07/14/2016 Document Revised: 08/16/2016 Document Reviewed: 07/14/2016 Elsevier Interactive Patient Education  2017 [employer].

## 2021-05-24 ENCOUNTER — Ambulatory Visit (INDEPENDENT_AMBULATORY_CARE_PROVIDER_SITE_OTHER): Payer: BC Managed Care – PPO | Admitting: Cardiology

## 2021-05-24 ENCOUNTER — Other Ambulatory Visit: Payer: Self-pay

## 2021-05-24 ENCOUNTER — Encounter: Payer: Self-pay | Admitting: Cardiology

## 2021-05-24 VITALS — BP 120/52 | HR 56 | Ht 69.5 in | Wt 158.6 lb

## 2021-05-24 DIAGNOSIS — E7800 Pure hypercholesterolemia, unspecified: Secondary | ICD-10-CM | POA: Diagnosis not present

## 2021-05-24 DIAGNOSIS — I10 Essential (primary) hypertension: Secondary | ICD-10-CM

## 2021-05-24 DIAGNOSIS — Z951 Presence of aortocoronary bypass graft: Secondary | ICD-10-CM

## 2021-05-24 DIAGNOSIS — I25708 Atherosclerosis of coronary artery bypass graft(s), unspecified, with other forms of angina pectoris: Secondary | ICD-10-CM | POA: Diagnosis not present

## 2021-05-24 DIAGNOSIS — I480 Paroxysmal atrial fibrillation: Secondary | ICD-10-CM | POA: Diagnosis not present

## 2021-05-26 ENCOUNTER — Ambulatory Visit: Payer: Self-pay | Admitting: Neurology

## 2021-05-31 ENCOUNTER — Ambulatory Visit: Payer: BC Managed Care – PPO | Admitting: Cardiology

## 2021-06-25 ENCOUNTER — Other Ambulatory Visit: Payer: Self-pay | Admitting: Cardiology

## 2021-08-02 ENCOUNTER — Telehealth: Payer: Self-pay | Admitting: Cardiology

## 2021-08-02 NOTE — Telephone Encounter (Signed)
Called and spoke with pt and wife OK per DPR.  Wife reports pt right leg has increased swelling.  Noticed after PCP increased Flomax dose about a week and a half ago.  Wife touched right leg with index finger reports leg indention.  She also reports pt has a hx of right leg/ foot trauma and normally has mild swelling.  However, swelling is worse and goes up right leg. Pt denies pain, SOB, no temperature difference. Pt has not been immobilized or prolonged sitting recently.  I advised wife to have pt elevate leg on pillows, wear compression socks if available, weigh daily.  I advised her to call 911 if pt has difficulty breathing. I also advised her to reach out to PCP office to report side effect since med increase.  Will route to MD to review.

## 2021-08-02 NOTE — Telephone Encounter (Signed)
   Pt c/o medication issue:  1. Name of Medication: Tamsulosin HCl (FLOMAX) 0.4 MG CAPS  2. How are you currently taking this medication (dosage and times per day)? Patient taking two capsules at night instead of one. Dosage was changed by  his PCP Dr. Brigitte Pulse 07/15/21.   3. Are you having a reaction (difficulty breathing--STAT)? Swelling in R leg only  4. What is your medication issue? Wife states the patients whole leg never swelled like this before he started doubling his medication   Pt c/o swelling: STAT is pt has developed SOB within 24 hours  If swelling, where is the swelling located?  R Leg Only  How much weight have you gained and in what time span? Not sure. Wife states he does not weigh himself daily  Have you gained 3 pounds in a day or 5 pounds in a week?   Do you have a log of your daily weights (if so, list)? no  Are you currently taking a fluid pill? no  Are you currently SOB? no  Have you traveled recently? No   Wife wants to know what else she should look out for

## 2021-08-03 NOTE — Telephone Encounter (Signed)
Spoke to wife Dr.Jordan agreed with triage RN's advice.She stated she called PCP last week and has never received a call back.She will call PCP again this morning.Advised to call back if needed.

## 2021-09-13 ENCOUNTER — Encounter: Payer: Self-pay | Admitting: Counselor

## 2021-09-13 ENCOUNTER — Other Ambulatory Visit: Payer: Self-pay

## 2021-09-13 ENCOUNTER — Ambulatory Visit (INDEPENDENT_AMBULATORY_CARE_PROVIDER_SITE_OTHER): Payer: BC Managed Care – PPO | Admitting: Counselor

## 2021-09-13 ENCOUNTER — Ambulatory Visit: Payer: BC Managed Care – PPO

## 2021-09-13 DIAGNOSIS — G3184 Mild cognitive impairment, so stated: Secondary | ICD-10-CM | POA: Diagnosis not present

## 2021-09-13 DIAGNOSIS — F09 Unspecified mental disorder due to known physiological condition: Secondary | ICD-10-CM

## 2021-09-13 DIAGNOSIS — R413 Other amnesia: Secondary | ICD-10-CM | POA: Diagnosis not present

## 2021-09-13 NOTE — Progress Notes (Signed)
   Psychometrist Note   Cognitive testing was administered to [employer] by Lamar Benes, B.S. (Technician) under the supervision of Alphonzo Severance, Psy.D., ABN. Mr. Pickrel was able to tolerate all test procedures. Dr. Nicole Kindred met with the patient as needed to manage any emotional reactions to the testing procedures. Rest breaks were offered.    The battery of tests administered was selected by Dr. Nicole Kindred with consideration to the patient's current level of functioning, the nature of his symptoms, emotional and behavioral responses during the interview, level of literacy, observed level of motivation/effort, and the nature of the referral question. This battery was communicated to the psychometrist. Communication between Dr. Nicole Kindred and the psychometrist was ongoing throughout the evaluation and Dr. Nicole Kindred was immediately accessible at all times. Dr. Nicole Kindred provided supervision to the technician on the date of this service, to the extent necessary to assure the quality of all services provided.    Mr. Walberg will return in approximately one week for an interactive feedback session with Dr. Nicole Kindred, at which time test performance, clinical impressions, and treatment recommendations will be reviewed in detail. The patient understands he can contact our office should he require our assistance before this time.   A total of 120 minutes of billable time were spent with [name] by the technician, including test administration and scoring time. Billing for these services is reflected in Dr. Les Pou note.   This note reflects time spent with the psychometrician and does not include test scores, clinical history, or any interpretations made by Dr. Nicole Kindred. The full report will follow in a separate note.

## 2021-09-13 NOTE — Progress Notes (Signed)
[facility]  Patient Name: [name] MRN: [id number] Date of Birth: 22-Jan-1946 Age: 75 y.o. Education: 11 years (+ GED) years  Referral Circumstances and Background Information  [name] is a 75 y.o., right-hand dominant, married man with a history of cognitive changes since a CABG x 4 in 2021. He had a heart attack shortly after that and has also had to have several of the stents revised. He was evaluated recently by Sharene Butters, PA-C who documented a MoCA of 20/30 (05/21/2021) with 0/5 on delayed recall and referred him for re-evaluation.   On interview, the patient reported that he is having some memory problems "with stuff" but "nothing critical." He denied that he is bothered by his memory problems or is very concerned. He is here today with [name] who has also noticed changes but said she is not concerned either. She notices his problems mostly when he gets "overwhelmed or stressed," although she did feel that it warranted clinical attention and mentioned them to his cardiologist. They noticed cognitive changes after a CABG x4, which was in January 2021. His wife thinks he was on the pump for 7 hours. The patient reported that his main symptom is forgetting "minor things," such as misplacing things. He has been that way all his his life but it is worse now. He also has a hard time getting back on track if he is interrupted. They are denying that his issues are worsening over time since they noticed them. His wife reported that she does notice him asking the same questions over again but she denied that he forgets other things that she tells him. This is typically over the course of a day. They are denying much in the way of issues with word finding, remembering people's names or the like. They are denying that he has notable difficulties with orientation; he typically knows what day, month, and year it is. They are denying any  hallucinations, delusions, or paranoia. He does have likely previous episodes of delirium associated with surgical/medical procedures. He had a hard time after his surgery in January, 2021 but they are reporting that he had very significant difficulties during an admission for stent replacement in April, 2022. He has similar episodes of likely delirium "almost every time they've put him asleep since we've been together" as per the patients wife, including multiple hernia repairs.   With respect to mood, the patient gets frustrated as per his wife. The patient himself says it's "the same as always," which he says is "usually pretty mellow" although he also says that he has a temper. He has reasonable energy, he thinks he sleeps more than he used to but he still has energy to do the things that he wants to do. He estimated that he gets about 5 hours of sleep, he has never needed much sleep. He has vivid dreams and talks in his sleep but that is not new and there is no other signs of dream reenactment. His appetite is normal for him.   With respect to functioning they are largely denying there is anything that he used to do that he cannot do now. He is still driving and they are denying that he has any problems doing so. He has not gotten lost. They reported that they both manage the finances, apparently he pays his bills online. His wife said that she did recently want to learn what bills he was paying, because he was  rather confused. He is handy and fixes things around the house, they are denying there are changes in his ability to do that. He does not cook. He does do laundry and is able to use appliances. He has no problems using a computer, he does not have a smart phone. They are denying any difficulties with judgment or problem solving. He is good with taking his medications and doesn't typically forget them.   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 12/20/2019   Unstable angina ([place]) 12/04/2019   Abnormal nuclear stress test    PAF (paroxysmal atrial fibrillation) ([place]) 04/21/2016   Pre-syncope 04/10/2016   Diabetes ([place]) 04/10/2016   HLD (hyperlipidemia)    Right inguinal hernia 10/18/2011   Personal history of colonic polyps 06/13/2011   Guaiac positive stools 06/13/2011   GERD (gastroesophageal reflux disease) 06/13/2011   ARTHRITIS 03/03/2009   DIABETES MELLITUS 03/02/2009   Essential hypertension 03/02/2009   Coronary atherosclerosis 03/02/2009   HEMORRHOIDS 03/02/2009   ESOPHAGEAL STRICTURE 03/02/2009   GERD 03/02/2009   HIATAL HERNIA 03/02/2009   POLYP, GALLBLADDER 03/02/2009   Review of Neuroimaging and Relevant Medical History: The patient is denying any history of head injuries, seizures, or strokes.   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 While his evans ratio is abnormal (.385) I favor central volume loss as a likely cause as opposed to conditions involving CSF fluid dynamics given specific features. There is a mild burden of hypodensity in the frontal and occipital horns, which may relate to small vessel disease but of course transependeymal flow can appear similarly. Overall the imaging might raise ones suspicion about conditions involving aberrant CSF fluid dynamics in addition to Alzheimer's given the extent of  temporal volume loss.   Current Outpatient Medications  Medication Sig Dispense Refill   acetaminophen (TYLENOL) 650 MG CR tablet Take 1,300 mg by mouth every 8 (eight) hours as needed for pain.     amLODipine (NORVASC) 5 MG tablet Take 1 tablet (5 mg total) by mouth daily. 90 tablet 3   aspirin EC 81 MG tablet Take 81 mg by mouth in the morning.     B Complex-C (B-COMPLEX WITH VITAMIN C) tablet Take 1 tablet by mouth daily with breakfast.      Calcium-Magnesium-Zinc (CAL-MAG-ZINC PO) Take 1 tablet by mouth daily with lunch.     cetirizine (ZYRTEC) 10 MG tablet Take 10 mg by mouth in the morning.     clopidogrel (PLAVIX) 75 MG tablet Take 1 tablet (75 mg total) by mouth daily with breakfast. 90 tablet 3   empagliflozin (JARDIANCE) 25 MG TABS tablet Take 25 mg by mouth in the morning. 30 tablet    fluticasone (FLONASE) 50 MCG/ACT nasal spray Place 2 sprays into both nostrils daily as needed for allergies.     isosorbide mononitrate (IMDUR) 60 MG  24 hr tablet Take 1 tablet (60 mg total) by mouth daily. 90 tablet 3   metoprolol tartrate (LOPRESSOR) 25 MG tablet Take 1 tablet (25 mg total) by mouth 2 (two) times daily. 180 tablet 3   nitroGLYCERIN (NITROSTAT) 0.4 MG SL tablet Place 1 tablet (0.4 mg total) under the tongue every 5 (five) minutes as needed. (Patient taking differently: Place 0.4 mg under the tongue every 5 (five) minutes as needed for chest pain.) 25 tablet 11   OneTouch Delica Lancets 99B MISC 1 each by Other route daily.     ONETOUCH VERIO test strip 1 each by Other route daily.     pantoprazole (PROTONIX) 40 MG tablet Take 1 tablet (40 mg total) by mouth 2 (two) times daily. 180 tablet 3   PRALUENT 150 MG/ML SOAJ INJECT 150MG  INTO THE SKIN EVERY 14 DAYS (Patient taking differently: Inject 150 mg into the skin every 14 (fourteen) days.) 2 mL 11   Tamsulosin HCl (FLOMAX) 0.4 MG CAPS Take 0.4 mg by mouth at bedtime.     No current facility-administered medications for this visit.     Family History  Problem Relation Age of Onset   Heart disease Mother    Heart disease Sister    Heart disease Brother    Heart disease Maternal Grandfather    Ovarian cancer Paternal Grandmother    Cancer Paternal Grandmother        cervical   Colon cancer Neg Hx    Esophageal cancer Neg Hx    Stomach cancer Neg Hx    There is no  family history of dementia. There is no  family history of psychiatric illness.  Psychosocial History  Developmental, Educational and Employment History: The patient was born in [place] and grew up in [place], [place]. He denied any history of abuse or neglect. The patient denied being concerned that he had ADHD, although he had previously told Ms. Wertman that he was concerned about this. He did say that he was not very focused in school and "didn't like being tied down all day when I wanted to do something else." They told me at great length how intelligent he is and that he is a "genius" and "tested off the charts with math" related to a state employment agency test. He reported that he has done a number of different things. He worked in Estate agent for a long time, in Architect, as a Psychiatrist, Games developer, Social research officer, government. He stated that he has not retired and still does things, then he stated that he stopped working around the time he had his heart attack, his wife said his last job was around 13. He was managing a garage Careers adviser.   Psychiatric History: None  Substance Use History: The patient does not drink much alcohol. He smoked cigarettes for many years but quit around 26 years ago.   Relationship History and Living Cimcumstances: The patient and his wife have been married about 49 years. He was married once previously and has two children from that marriage.They have three children, one is in [place], and two live in the area. The patient's wife shook her head when I asked if they had noticed any of the memory and thinking problems.   Mental Status and  Behavioral Observations  Sensorium/Arousal: The patient's level of arousal was awake and alert. Hearing and vision were adequate for testing purposes. Patient was color blind but was able to see the [facility] cards with no problem as per technician  who did additional testing of color identification. Orientation: The patient was fully oriented to person, place, time, and situation.  Appearance: Dressed in appropriate, casual clothing with reasonable grooming and hygiene.  Behavior: Pleasant and appropriate Speech/language: The patient's speech was normal in rate, rhythm, volume and prosody.  Gait/Posture: Not formally examined, normal with neurology Movement: No obvious signs of movement disorder Social Comportment: The patient was pleasant and appropriate if not a bit flippant at times.  Mood: "Like it always is" Affect: Euthymic Thought process/content: Thought process was logical and linear, goal directed, and he was able to supply a reasonable personal history and timeline.  Safety: Any thoughts of harming self or others were denied Insight: Fair  Test Procedures  Wide Range Achievement Test - 4             Word Reading Neuropsychological Assessment Battery  List Learning  Story Learning  Daily Living Memory  Naming  Digit Span Repeatable Battery for the Assessment of Neuropsychological Status (Form A)  Figure Copy  Judgment of Line Orientation  Coding  Figure Recall The Dot Counting Test A Random Letter Test Controlled Oral Word Association (F-A-S) Semantic Fluency (Animals) Trail Making Test A & B Complex Ideational Material Modified [place] Card Sorting Test Geriatric Depression Scale - Short Form Quick Dementia Rating System (completed by wife, [name])  Plan  YADIR ZENTNER was seen for a psychiatric diagnostic evaluation and neuropsychological testing. He is a 75 year old, right-hand dominant man with a significant amount of central and mesial temporal volume loss on  neuroimaging. His evans ratio is abnormal but he has no signs and symptoms of hydrocephalus and I do think this may be ex vacuo (it was not noted by radiology). 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 He will return to clinic in approximately a week for feed back regarding his test data. Full and complete note with impressions, recommendations, and interpretation of test data to follow.   Viviano Simas Nicole Kindred, PsyD, [place] Clinical Neuropsychologist  Informed Consent  Risks and benefits of the evaluation were discussed with the patient prior to all testing procedures. I conducted a clinical interview   with [name] and [name], B.S. (Technician) administered additional test procedures. The patient was able to tolerate the testing procedures and the patient (and/or family if applicable) is likely to benefit from further follow up to receive the diagnosis and treatment recommendations, which will be rendered at the next encounter.

## 2021-09-14 NOTE — Progress Notes (Signed)
[facility]  Patient Name: [name] MRN: [id number] Date of Birth: September 25, 1946 Age: 75 y.o. Education: 11 years (+ GED)  Measurement properties of test scores: IQ, Index, and Standard Scores (SS): Mean = 100; Standard Deviation = 15 Scaled Scores (Ss): Mean = 10; Standard Deviation = 3 Z scores (Z): Mean = 0; Standard Deviation = 1 T scores (T); Mean = 50; Standard Deviation = 10  TEST SCORES:    Note: This summary of test scores accompanies the interpretive report and should not be interpreted by unqualified individuals or in isolation without reference to the report. Test scores are relative to age, gender, and educational history as available and appropriate.   Performance Validity        "A" Random Letter Test Raw  Descriptor      Errors 0 Within Expectation  The Dot Counting Test: 11 Within Expectation  NAB Effort Index 4 Below Expectation      Mental Status Screening     Total Score Descriptor  MoCA 20 MCI      Expected Functioning        Wide Range Achievement Test: Standard/Scaled Score Percentile      Word Reading 100 50      Reynolds Intellectual Screening Test Standard/T-score Percentile      Guess What 54 66      Odd Item Out 57 75  RIST Index 110 75      Attention/Processing Speed        Neuropsychological Assessment Battery (Attention Module, Form 1): T-score Percentile      Digits Forward 62 88      Digits Backwards 54 66      Repeatable Battery for the Assessment of Neuropsychological Status (Form A): Scaled Score Percentile      Coding 7 16      Language        Neuropsychological Assessment Battery (Language Module, Form 1): T-score Percentile      Naming   (31) 60 84      Verbal Fluency: T-score Percentile      Controlled Oral Word Association (F-A-S) 40 16      Semantic Fluency (Animals) 29 2      Memory:        Neuropsychological Assessment Battery (Memory Module, Form 1): T-score Percentile       List Learning           List A Immediate Recall   (1, 1, 2) 24 <1         List B Immediate Recall   (3) 51 54         List A Short Delayed Recall   (0) 30 2         List A Long Delayed Recall   (0) 36 8         List A Percent Retention   (0 %) --- <1         List A Long Delayed Yes/No Recognition Hits   (4) --- <1         List A Long Delayed Yes/No Recognition False Alarms   (8) --- 18         List A Recognition Discriminability Index --- <1      Story Learning           Immediate Recall   (14, 8) 31 3         Delayed Recall   (0) 37 9  Percent Retention   (0 %) --- <1      Daily Living Memory            Immediate Recall   (19, 13) 45 31          Delayed Recall   (0, 0) 24 <1          Percent Retention (0 %) --- <1          Recognition Hits    (2) --- <1      Repeatable Battery for the Assessment of Neuropsychological Status (Form A): Scaled Score Percentile         Figure Recall   (0) 1 <1      Visuospatial/Constructional Functioning        Repeatable Battery for the Assessment of Neuropsychological Status (Form A): Standard/Scaled Score Percentile     Visuospatial/Constructional Index 109 73         Figure Copy   (20) 14 91         Judgment of Line Orientation   (16) --- 26-50      Executive Functioning        Modified [employer] Test (MWCST): Standard/T-Score Percentile      Number of Categories Correct 34 5      Number of Perseverative Errors 40 16      Number of Total Errors 37 9      Percent Perseverative Errors 49 46  Executive Function Composite 79 8      Trail Making Test: T-Score Percentile      Part A 63 91      Part B 52 58      Boston Diagnostic Aphasia Exam: Raw Score Scaled Score      Complex Ideational Material 10 7      Clock Drawing Raw Score Descriptor      Command 8 Borderline Impairment      Rating Scales        Clinical Dementia Rating Raw Score Descriptor      Sum of Boxes 2.5 Very Mild Dementia      Global Score 0.5  MCI      Quick Dementia Rating System Raw Score Descriptor      Sum of Boxes 1 MCI      Total Score 1.5 MCI  Geriatric Depression Scale - Short Form 1 Negative   Pike Scantlebury V. Nicole Kindred PsyD, [place] Clinical Neuropsychologist

## 2021-09-16 NOTE — Progress Notes (Signed)
[facility]  Patient Name: [name] MRN: [id number] Date of Birth: 12/10/1945 Age: 75 y.o. Education: 11 years (+ GED)  Clinical Impressions  [name] is a 75 y.o., right-hand dominant, married man with a history of problems with memory and thinking since a CABG x4 in 2021. He had a heart attack shortly after that and also required several stent revisions, experiencing altered mental status during many of those admissions, so it is difficult to disentangle when exactly things started. He and his wife reported that his issues have been more or less static over time. His main symptom is memory loss and forgetting where he has placed things. They are denying that he has difficulties doing anything day-to-day on the basis of his cognitive difficulties.   Performance on neuropsychological testing is notable for memory storage problems, extremely low semantic fluency, and some marginal findings on measures of executive abilities. Performance was normal in other areas. The patient screened negative for the presence of depression and his wife characterizes him as functioning at barely an MCI level problem. 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   [name] is thus demonstrating likely memory storage problems and other findings suggestive of temporal lobe dysfunction as can often be observed in age-related progressive causes of cognitive decline. The fact that he has experienced altered mental status after each of his operations is also concerning given that having an underlying condition is a risk factor for delirium (and delirium in turn is a risk factor for dementia). He should be followed over time. I will counsel the patient and his wife about primary prevention, such as with MIND - DASH diet and the like. He does not meet the functional impairment criterion for dementia and so it is probably premature to discuss pharmacologic  treatment although I defer that entirely to his medical care providers.   Diagnostic Impressions: Amnestic mild cognitive impairment  Recommendations to be discussed with patient  Your performance and presentation were consistent with diminished scores on measures of memory, generation of words in a given category, and you did well on other measures. These findings are sufficient for a diagnosis of mild cognitive impairment, amnestic subtype.  The major difference between mild cognitive impairment (MCI) and dementia is in severity and potential prognosis. Once someone reaches a level of severity adequate to be diagnosed with a dementia, there is usually progression over time, though this may be years. On the other hand, mild cognitive impairment, while a significant risk for dementia in future, does not always progress to dementia, and in some instances stays the same or can even revert to normal. It is important to realize that if MCI is due to underlying Alzheimer's disease, it will most likely progress to dementia eventually. The rate of conversion to Alzheimer's dementia from amnestic MCI is about 15% per year versus the general population risk of conversion of 2% per year.   In your case, I am concerned that your mild cognitive impairment could be due to a progressive underlying cause of cognitive dysfunction and therefore I would recommend that you be followed over time.  If your condition is due to something like Alzheimer's disease, which is my primary concern given your test data and demographics, then there will be progression over time.  The good news is that there are multiple things you can do to reduce your risk of developing dementia in the future.  There is now good  quality evidence from at least one large scale study that a modified mediterranean diet may help slow cognitive decline. This is known as the "MIND" diet. The Mind diet is not so much a specific diet as it is a set of  recommendations for things that you should and should not eat.   Foods that are ENCOURAGED on the MIND Diet:  Green, leafy vegetables: Aim for six or more servings per week. This includes kale, spinach, cooked greens and salads.  All other vegetables: Try to eat another vegetable in addition to the green leafy vegetables at least once a day. It is best to choose non-starchy vegetables because they have a lot of nutrients with a low number of calories.  Berries: Eat berries at least twice a week. There is a plethora of research on strawberries, and other berries such as blueberries, raspberries and blackberries have also been found to have antioxidant and brain health benefits.  Nuts: Try to get five servings of nuts or more each week. The creators of the [place] don't specify what kind of nuts to consume, but it is probably best to vary the type of nuts you eat to obtain a variety of nutrients. Peanuts are a legume and do not fall into this category.  Olive oil: Use olive oil as your main cooking oil. There may be other heart-healthy alternatives such as algae oil, though there is not yet sufficient research upon which to base a formal recommendation.  Whole grains: Aim for at least three servings daily. Choose minimally processed grains like oatmeal, quinoa, brown rice, whole-wheat pasta and 100% whole-wheat bread.  Fish: Eat fish at least once a week. It is best to choose fatty fish like salmon, sardines, trout, tuna and mackerel for their high amounts of omega-3 fatty acids.  Beans: Include beans in at least four meals every week. This includes all beans, lentils and soybeans.  Poultry: Try to eat chicken or [place] at least twice a week. Note that fried chicken is not encouraged on the MIND diet.  Wine: Aim for no more than one glass of alcohol daily. Both red and white wine may benefit the brain. However, much research has focused on the red wine compound resveratrol, which may help protect against  Alzheimer's disease.  Foods that are DISCOURAGED on the MIND Diet: Butter and margarine: Try to eat less than 1 tablespoon (about 14 grams) daily. Instead, try using olive oil as your primary cooking fat, and dipping your bread in olive oil with herbs.  Cheese: The MIND diet recommends limiting your cheese consumption to less than once per week.  Red meat: Aim for no more than three servings each week. This includes all beef, pork, lamb and products made from these meats.  [place] food: The MIND diet highly discourages fried food, especially the kind from fast-food restaurants. Limit your consumption to less than once per week.  Pastries and sweets: This includes most of the processed junk food and desserts you can think of. Ice cream, cookies, brownies, snack cakes, donuts, candy and more. Try to limit these to no more than four times a week.  Exercise is one of the best medicines for promoting health and maintaining cognitive fitness at all stages in life. Exercise probably has the largest documented effect on brain health and performance of any lifestyle intervention. Studies have shown that even previously sedentary individuals who start exercising as late as age 70 show a significant survival benefit as compared to their non-exercising  peers. In the [place], the current guidelines are for 30 minutes of moderate exercise per day, but increasing your activity level less than that may also be helpful. You do not have to get your 30 minutes of exercise in one shot and exercising for short periods of time spread throughout the day can be helpful. Go for several walks, learn to dance, or do something else you enjoy that gets your body moving. Of course, if you have an underlying medical condition or there is any question about whether it is safe for you to exercise, you should consult a medical treatment provider prior to beginning exercise.   As with all capable adults, I would recommend that you  consult with an elder care attorney regarding advanced directives if you have not, such as a healthcare power of attorney and living will. Consultation with an elder care attorney can help you protect your estate and communicate your preferences to your loved ones formally, in the event you are not able to do so yourself. I can provide my strong recommendation for the [employer], who are located at [address] in [place], [place]. They can be reached at [phone]. They also have a website [email or web address].   Return to clinic in 1 to 2 years for follow-up evaluation, as clinically indicated.  Test Findings  Test scores are summarized in additional documentation associated with this encounter. Test scores are relative to age, gender, and educational history as available and appropriate. There were no concerns about performance validity as all findings fell within normal expectations.   General Intellectual Functioning/Achievement:  Performance on single word reading was average.  By contrast, his performance on the RIST index was high average, with scores on all indicators hovering around the upper end of average to the lower end of high average  Attention and Processing Efficiency: Performance on indicators of attention involving digit repetition was good with high average (nearly superior performance) on digit repetition forward and average performance on digit repetition backward. Timed number-symbol coding was low average.   Language: Visual object confrontation naming was good, with an errorless score. By contrast, he had difficulties on timed measures of verbal fluency, with extremely low semantic and low average phonemic fluency.  Visuospatial Function: Performance on visuospatial/constructional measures represented an area of significant strength this patient, which might be expected by virtue of his previous vocations. Performance on the visuospatial/constructional  index of the RBANS was towards upper most extreme of the average range. Figure copy was errorless and in the superior range. Judgment of angular line orientations was average.   Learning and Memory: Performance on measures of learning and memory was concerning for significant difficulties in all areas, with a pattern concerning for storage problems.  In the verbal realm, he learned 1, 1, and 2 words of a 12 item word list and then did not recall any on long-delayed recall. His recognition from the words from the list versus false choices was also extremely low, with more false positives and correct identifications. Short story learning was unusually low on immediate recall and low on delayed recall (he did not remember anything). Memory for brief daily-living type information was average on immediate recall but delayed recall was extremely low (did not recall any information). Recognition discriminability for the information was also low.   In the visual realm, copy of a modestly complex geometric figure was average  Executive Functions: Indicators of executive abilities showed performance straddling the unusually low  and low average ranges. While his number of solution categories was unusually low, he was able to achieve a low average perseverative area score, rendering this finding not clearly abnormal. Alternating sequencing of numbers and letters of the alphabet was average.  Reasoning with verbal information was low average on the complex ideational material.  Clock drawing, by way of comparison, showed "borderline impairment", with stimulus bound placement of the hands.  Rating Scale(s): Mr. David was characterized as functioning at barely an MCI level by his wife. I was able to rate CDR for him, his sum of boxes is 2.5, although in his case is not clear that he meets the functional criterion impairment for dementia.  Viviano Simas Nicole Kindred, PsyD, ABN Clinical Neuropsychologist  Coding and  Compliance  Billing below reflects technician time, my direct face-to-face time with the patient, time spent in test administration, and time spent in professional activities including but not limited to: neuropsychological test interpretation, integration of neuropsychological test data with clinical history, report preparation, treatment planning, care coordination, and review of diagnostically pertinent medical history or studies.   Services associated with this encounter: Clinical Interview [phone]) plus 160 minutes (96132/96133; Neuropsychological Evaluation by Professional)  25 minutes (96136/96137; Test Administration by Professional) 120 minutes (96138/96139; Neuropsychological Testing by Technician)

## 2021-09-22 ENCOUNTER — Ambulatory Visit (INDEPENDENT_AMBULATORY_CARE_PROVIDER_SITE_OTHER): Payer: BC Managed Care – PPO | Admitting: Counselor

## 2021-09-22 ENCOUNTER — Other Ambulatory Visit: Payer: Self-pay

## 2021-09-22 ENCOUNTER — Encounter: Payer: Self-pay | Admitting: Counselor

## 2021-09-22 DIAGNOSIS — G3184 Mild cognitive impairment, so stated: Secondary | ICD-10-CM

## 2021-09-22 NOTE — Progress Notes (Signed)
NEUROPSYCHOLOGY FEEDBACK NOTE [name]  Feedback Note: I met with [name] to review the findings resulting from his neuropsychological evaluation. Since the last appointment, he has been about the same. Time was spent reviewing the impressions and recommendations that are detailed in the evaluation report. We discussed impression of amnestic mild cognitive impairment, at risk for decline, as reflected in the patient instructions. I took time to explain the findings and answer all the patient's questions. I encouraged [name] to contact me should he have any further questions or if further follow up is desired.   Current Medications and Medical History   Current Outpatient Medications  Medication Sig Dispense Refill   acetaminophen (TYLENOL) 650 MG CR tablet Take 1,300 mg by mouth every 8 (eight) hours as needed for pain.     amLODipine (NORVASC) 5 MG tablet Take 1 tablet (5 mg total) by mouth daily. 90 tablet 3   aspirin EC 81 MG tablet Take 81 mg by mouth in the morning.     B Complex-C (B-COMPLEX WITH VITAMIN C) tablet Take 1 tablet by mouth daily with breakfast.      Calcium-Magnesium-Zinc (CAL-MAG-ZINC PO) Take 1 tablet by mouth daily with lunch.     cetirizine (ZYRTEC) 10 MG tablet Take 10 mg by mouth in the morning.     clopidogrel (PLAVIX) 75 MG tablet Take 1 tablet (75 mg total) by mouth daily with breakfast. 90 tablet 3   empagliflozin (JARDIANCE) 25 MG TABS tablet Take 25 mg by mouth in the morning. 30 tablet    fluticasone (FLONASE) 50 MCG/ACT nasal spray Place 2 sprays into both nostrils daily as needed for allergies.     isosorbide mononitrate (IMDUR) 60 MG 24 hr tablet Take 1 tablet (60 mg total) by mouth daily. 90 tablet 3   metoprolol tartrate (LOPRESSOR) 25 MG tablet Take 1 tablet (25 mg total) by mouth 2 (two) times daily. 180 tablet 3   nitroGLYCERIN (NITROSTAT) 0.4 MG SL tablet Place 1 tablet (0.4 mg total) under the tongue every 5 (five) minutes as  needed. (Patient taking differently: Place 0.4 mg under the tongue every 5 (five) minutes as needed for chest pain.) 25 tablet 11   OneTouch Delica Lancets 97D MISC 1 each by Other route daily.     ONETOUCH VERIO test strip 1 each by Other route daily.     pantoprazole (PROTONIX) 40 MG tablet Take 1 tablet (40 mg total) by mouth 2 (two) times daily. 180 tablet 3   PRALUENT 150 MG/ML SOAJ INJECT 150MG INTO THE SKIN EVERY 14 DAYS (Patient taking differently: Inject 150 mg into the skin every 14 (fourteen) days.) 2 mL 11   Tamsulosin HCl (FLOMAX) 0.4 MG CAPS Take 0.4 mg by mouth at bedtime.     No current facility-administered medications for this visit.   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 12/20/2019   Unstable angina ([place]) 12/04/2019   Abnormal nuclear stress test    PAF (paroxysmal  atrial fibrillation) ([place]) 04/21/2016   Pre-syncope 04/10/2016   Diabetes ([place]) 04/10/2016   HLD (hyperlipidemia)    Right inguinal hernia 10/18/2011   Personal history of colonic polyps 06/13/2011   Guaiac positive stools 06/13/2011   GERD (gastroesophageal reflux disease) 06/13/2011   ARTHRITIS 03/03/2009   DIABETES MELLITUS 03/02/2009   Essential hypertension 03/02/2009   Coronary atherosclerosis 03/02/2009   HEMORRHOIDS 03/02/2009   ESOPHAGEAL STRICTURE 03/02/2009   GERD 03/02/2009   HIATAL HERNIA 03/02/2009   POLYP, GALLBLADDER 03/02/2009   Mental Status and Behavioral Observations   [name] presented on time to the present encounter and was alert and generally oriented. Speech was normal in rate, rhythm, volume, and prosody. Self-reported mood was "good" and affect was euthymic. Thought process was logical and goal oriented and thought content was appropriate. There were no safety concerns identified at today's encounter, such as thoughts of harming self or others.   Plan  Feedback provided regarding the patient's neuropsychological evaluation. He will work on [employer], exercise (although I cautioned him to consult with his cardiologist first) . [name] was encouraged to contact me if any questions arise or if further follow up is desired.   Viviano Simas Nicole Kindred, PsyD, ABN Clinical Neuropsychologist  Service(s) Provided at This Encounter: 30 minutes [phone]; Conjoint therapy with patient present)

## 2021-09-22 NOTE — Patient Instructions (Addendum)
Your performance and presentation were consistent with diminished scores on measures of memory, generation of words in a given category, and you did well on other measures. These findings are sufficient for a diagnosis of mild cognitive impairment, amnestic subtype.  The major difference between mild cognitive impairment (MCI) and dementia is in severity and potential prognosis. Once someone reaches a level of severity adequate to be diagnosed with a dementia, there is usually progression over time, though this may be years. On the other hand, mild cognitive impairment, while a significant risk for dementia in future, does not always progress to dementia, and in some instances stays the same or can even revert to normal. It is important to realize that if MCI is due to underlying Alzheimer's disease, it will most likely progress to dementia eventually. The rate of conversion to Alzheimer's dementia from amnestic MCI is about 15% per year versus the general population risk of conversion of 2% per year.    In your case, I am concerned that your mild cognitive impairment could be due to a progressive underlying cause of cognitive dysfunction and therefore I would recommend that you be followed over time.  If your condition is due to something like Alzheimer's disease, which is my primary concern given your test data and demographics, then there will be progression over time.  The good news is that there are multiple things you can do to reduce your risk of developing dementia in the future.  There is now good quality evidence from at least one large scale study that a modified mediterranean diet may help slow cognitive decline. This is known as the "MIND" diet. The Mind diet is not so much a specific diet as it is a set of recommendations for things that you should and should not eat.   Foods that are ENCOURAGED on the MIND Diet:  Green, leafy vegetables: Aim for six or more servings per week. This includes  kale, spinach, cooked greens and salads.  All other vegetables: Try to eat another vegetable in addition to the green leafy vegetables at least once a day. It is best to choose non-starchy vegetables because they have a lot of nutrients with a low number of calories.  Berries: Eat berries at least twice a week. There is a plethora of research on strawberries, and other berries such as blueberries, raspberries and blackberries have also been found to have antioxidant and brain health benefits.  Nuts: Try to get five servings of nuts or more each week. The creators of the [place] don't specify what kind of nuts to consume, but it is probably best to vary the type of nuts you eat to obtain a variety of nutrients. Peanuts are a legume and do not fall into this category.  Olive oil: Use olive oil as your main cooking oil. There may be other heart-healthy alternatives such as algae oil, though there is not yet sufficient research upon which to base a formal recommendation.  Whole grains: Aim for at least three servings daily. Choose minimally processed grains like oatmeal, quinoa, brown rice, whole-wheat pasta and 100% whole-wheat bread.  Fish: Eat fish at least once a week. It is best to choose fatty fish like salmon, sardines, trout, tuna and mackerel for their high amounts of omega-3 fatty acids.  Beans: Include beans in at least four meals every week. This includes all beans, lentils and soybeans.  Poultry: Try to eat chicken or [place] at least twice a week. Note that fried  chicken is not encouraged on the MIND diet.  Wine: Aim for no more than one glass of alcohol daily. Both red and white wine may benefit the brain. However, much research has focused on the red wine compound resveratrol, which may help protect against Alzheimer's disease.  Foods that are DISCOURAGED on the MIND Diet: Butter and margarine: Try to eat less than 1 tablespoon (about 14 grams) daily. Instead, try using olive oil as your  primary cooking fat, and dipping your bread in olive oil with herbs.  Cheese: The MIND diet recommends limiting your cheese consumption to less than once per week.  Red meat: Aim for no more than three servings each week. This includes all beef, pork, lamb and products made from these meats.  [place] food: The MIND diet highly discourages fried food, especially the kind from fast-food restaurants. Limit your consumption to less than once per week.  Pastries and sweets: This includes most of the processed junk food and desserts you can think of. Ice cream, cookies, brownies, snack cakes, donuts, candy and more. Try to limit these to no more than four times a week.   Exercise is one of the best medicines for promoting health and maintaining cognitive fitness at all stages in life. Exercise probably has the largest documented effect on brain health and performance of any lifestyle intervention. Studies have shown that even previously sedentary individuals who start exercising as late as age 58 show a significant survival benefit as compared to their non-exercising peers. In the [place], the current guidelines are for 30 minutes of moderate exercise per day, but increasing your activity level less than that may also be helpful. You do not have to get your 30 minutes of exercise in one shot and exercising for short periods of time spread throughout the day can be helpful. Go for several walks, learn to dance, or do something else you enjoy that gets your body moving. Of course, if you have an underlying medical condition or there is any question about whether it is safe for you to exercise, you should consult a medical treatment provider prior to beginning exercise.    As with all capable adults, I would recommend that you consult with an elder care attorney regarding advanced directives if you have not, such as a healthcare power of attorney and living will. Consultation with an elder care attorney can help  you protect your estate and communicate your preferences to your loved ones formally, in the event you are not able to do so yourself. I can provide my strong recommendation for the [employer], who are located at [address] in [place], [place]. They can be reached at [phone]. They also have a website [email or web address].    The Alzheimer's association is a Pensions consultant for individuals with Alzheimer's disease and related disorders (ADRD). They offer a number of different services including support groups, a 24/7 crisis line, useful information, and activities such as their walk to end Alzheimer's to raise awareness about the disease. Many people benefit greatly from interacting with, learning from, and sharing information with others who are going through the same experience. Their website CapitalMile.co.nz is a great place to start. If you are interested, I can make a referral for a social worker from the Alzheimer's association to contact you.  Return to clinic in 1 to 2 years for follow-up evaluation, as clinically indicated.

## 2021-10-11 ENCOUNTER — Other Ambulatory Visit: Payer: Self-pay

## 2021-10-11 ENCOUNTER — Ambulatory Visit (INDEPENDENT_AMBULATORY_CARE_PROVIDER_SITE_OTHER): Payer: BC Managed Care – PPO | Admitting: Physician Assistant

## 2021-10-11 ENCOUNTER — Encounter: Payer: Self-pay | Admitting: Physician Assistant

## 2021-10-11 VITALS — BP 142/83 | HR 71 | Resp 18 | Ht 70.0 in | Wt 155.0 lb

## 2021-10-11 DIAGNOSIS — G3184 Mild cognitive impairment, so stated: Secondary | ICD-10-CM

## 2021-10-11 NOTE — Patient Instructions (Signed)
It was a pleasure to see you today at our office.   Recommendations:  Follow up in 6  months   RECOMMENDATIONS FOR ALL PATIENTS WITH MEMORY PROBLEMS: 1. Continue to exercise (Recommend 30 minutes of walking everyday, or 3 hours every week) 2. Increase social interactions - continue going to [place] and enjoy social gatherings with friends and family 3. Eat healthy, avoid fried foods and eat more fruits and vegetables 4. Maintain adequate blood pressure, blood sugar, and blood cholesterol level. Reducing the risk of stroke and cardiovascular disease also helps promoting better memory. 5. Avoid stressful situations. Live a simple life and avoid aggravations. Organize your time and prepare for the next day in anticipation. 6. Sleep well, avoid any interruptions of sleep and avoid any distractions in the bedroom that may interfere with adequate sleep quality 7. Avoid sugar, avoid sweets as there is a strong link between excessive sugar intake, diabetes, and cognitive impairment We discussed the Mediterranean diet, which has been shown to help patients reduce the risk of progressive memory disorders and reduces cardiovascular risk. This includes eating fish, eat fruits and green leafy vegetables, nuts like almonds and hazelnuts, walnuts, and also use olive oil. Avoid fast foods and fried foods as much as possible. Avoid sweets and sugar as sugar use has been linked to worsening of memory function.  There is always a concern of gradual progression of memory problems. If this is the case, then we may need to adjust level of care according to patient needs. Support, both to the patient and caregiver, should then be put into place.    FALL PRECAUTIONS: Be cautious when walking. Scan the area for obstacles that may increase the risk of trips and falls. When getting up in the mornings, sit up at the edge of the bed for a few minutes before getting out of bed. Consider elevating the bed at the head end to  avoid drop of blood pressure when getting up. Walk always in a well-lit room (use night lights in the walls). Avoid area rugs or power cords from appliances in the middle of the walkways. Use a walker or a cane if necessary and consider physical therapy for balance exercise. Get your eyesight checked regularly.  FINANCIAL OVERSIGHT: Supervision, especially oversight when making financial decisions or transactions is also recommended.  HOME SAFETY: Consider the safety of the kitchen when operating appliances like stoves, microwave oven, and blender. Consider having supervision and share cooking responsibilities until no longer able to participate in those. Accidents with firearms and other hazards in the house should be identified and addressed as well.   ABILITY TO BE LEFT ALONE: If patient is unable to contact 911 operator, consider using LifeLine, or when the need is there, arrange for someone to stay with patients. Smoking is a fire hazard, consider supervision or cessation. Risk of wandering should be assessed by caregiver and if detected at any point, supervision and safe proof recommendations should be instituted.  MEDICATION SUPERVISION: Inability to self-administer medication needs to be constantly addressed. Implement a mechanism to ensure safe administration of the medications.   DRIVING: Regarding driving, in patients with progressive memory problems, driving will be impaired. We advise to have someone else do the driving if trouble finding directions or if minor accidents are reported. Independent driving assessment is available to determine safety of driving.   If you are interested in the driving assessment, you can contact the following:  [facility] [employer] in [place]  [facility] [phone]  [facility] [phone]  [place] [phone] or [phone]

## 2021-10-11 NOTE — Progress Notes (Signed)
Assessment/Plan:    Amnestic Mild Cognitive Impairment   Recommendations:  Discussed safety both in and out of the home.  Discussed the importance of regular daily schedule with inclusion of crossword puzzles to maintain brain function.  Continue to monitor mood by PCP Stay active at least 30 minutes at least 3 times a week.  Naps should be scheduled and should be no longer than 60 minutes and should not occur after 2 PM.  Repeat neuropsychological testing in 2 years for clarity of diagnosis and plan of care Mediterranean diet is recommended No indication for antidementia medication at this time  follow up in 6  months.   Case discussed with Dr. Delice Lesch who agrees with the plan     Subjective:    [name] is a 75 y.o. RH male with a history of  hypertension, hyperlipidemia, chronic bradycardia, DM2, CAD s/p CABG p STEMI, PAF, with a diagnosis of MCI be neuropsychological testing on 09/13/21, seen today in follow up. He was last seen on 05/21/21 at which time MoCA was 20/30.  CT of the head 12/18/2019 showed mild chronic ischemic white matter disease, without any acute intra cranial abnormality.  This patient is accompanied in the office by his wife who supplements the history.  Previous records as well as any outside records available were reviewed prior to today's visit. Since his last visit, the patient's memory is reported to be about the same.  His wife reports that perhaps distraction is a factor, and he would be discussed with his PCP regarding possible attention issues. His mood is overall good, without depression or irritability.  He has occasional moments in which he may "lose her temper ".  He continues to enjoy his yard work, he admits to not doing any other computer games but [place], he does not enjoy word finding or similar activities.  He continues to sleep somewhere around 4 to 5 hours a night, with vivid dreams, but "I always dream a lot ".  No sleepwalking, or  REM behavior.  He denies hallucinations or paranoia.  He independent of bathing and dressing.  He denies missing doses of medications.  He continues to drive, without getting lost.  He only drives short distances.  He continues to do his own finances without missing payments.  Denies headaches, double vision, dizziness, focal numbness or tingling, unilateral weakness or tremors or anosmia. No history of seizures.  He has a history of urinary incontinence followed by urology., retention, constipation or diarrhea.    Initial Consult 05/21/21 The patient is seen in neurologic consultation at the request of [place], Peter M, MD for the evaluation of memory.  The patient is accompanied by wife who supplements the history.  He is a 75 year old male who had memory issues for about 2 years, although he states that after his first MI and CABG in 2021 he began to notice "little things, such as losing more frequently that he is, or coming up with the name of an object or another name".  After his second MII, his symptoms appeared to worsen, with significant difficulty in recalling, although "stress makes it worse ".  His wife reports that when they are due to being alone, these memory issues are less evident, "he could almost make it to 100% ".  However, while they are surrounded by a crowd, he becomes easily distracted, and he is unable to recall, especially if some new information would have been presented a few minutes earlier, and trying  to remember them 5 minutes later.  He reports that he may have undiagnosed ADHD.  He states that when growing up, the teachers would always reprimanded him for not paying attention, dropping out of  High School,  because he could not concentrate.  However, at home he could read the [employer], "tons of magazines and books ", and also learning to do a lot of manual activities.   He held numerous "hands-on jobs, including being a Engineer, drilling, Games developer, Dealer, working in the  [employer], being a Actuary ".  The wife says that he would become bored with one activity, and try something new.  Currently, he likes to do yard work, cutting grass "2 acres of it ".  He also likes to play [employer], Chief Strategy Officer, board games.   His mood is overall good, without depression or irritability.  He states that he did very well during the pandemic, was not feeling lonely.  He sleeps about 6 hours at night, and always had vivid dreams, no changes in the pattern.  He denies sleepwalking. He is able to do all his activities of daily living, such as bathing, dressing up, taking his medications without missing doses, or driving without getting lost.  He drives with a GPS but not afraid of getting lost, driving long distances without any issues.  He does his own finances without missing any payments.  "In fact, I had to remind my wife to pay on  time ".  He does not cook.  He used to have headaches as a youngster, but since moving from [place] to [place], the last 34 years he has not had any migraine headaches.   He denies any trauma to the head, double vision, dizziness, focal numbness or tingling, unilateral weakness or tremors.  He has a history of BPH, reporting frequent urination with residual, and has to be seen by urology soon.  He has an appointment with them.  He denies any constipation or diarrhea.  Denies any history of sleep apnea, alcohol, he has remote tobacco history, quitting a 22 pack year in 2016.  Family history is negative for dementia. 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     CT head wo contrast 12/18/19  Mild chronic ischemic white matter disease. No acute intracranial abnormality seen.   Neuropsychological Evaluation, Dr. Nicole Kindred 09/13/21 "[name] is thus demonstrating likely memory storage problems and other findings suggestive of temporal lobe dysfunction as can often be observed in age-related progressive causes of cognitive decline. The  fact that he has experienced altered mental status after each of his operations is also concerning given that having an underlying condition is a risk factor for delirium (and delirium in turn is a risk factor for dementia). He should be followed over time.  [name] was seen for a psychiatric diagnostic evaluation and neuropsychological testing. He is a 75 year old, right-hand dominant man with a significant amount of central and mesial temporal volume loss on neuroimaging. His evans ratio is abnormal but he has no signs and symptoms of hydrocephalus and I do think this may be ex vacuo (it was not noted by radiology). 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 and preliminary review of test data suggest that neuropsychological findings are likely to demonstrate a similar pattern"  PREVIOUS MEDICATIONS:   CURRENT MEDICATIONS:  Outpatient Encounter Medications as of 10/11/2021  Medication Sig   acetaminophen (TYLENOL) 650 MG CR tablet Take 1,300  mg by mouth every 8 (eight) hours as needed for pain.   amLODipine (NORVASC) 5 MG tablet Take 1 tablet (5 mg total) by mouth daily.   aspirin EC 81 MG tablet Take 81 mg by mouth in the morning.   B Complex-C (B-COMPLEX WITH VITAMIN C) tablet Take 1 tablet by mouth daily with breakfast.    Calcium-Magnesium-Zinc (CAL-MAG-ZINC PO) Take 1 tablet by mouth daily with lunch.   cetirizine (ZYRTEC) 10 MG tablet Take 10 mg by mouth in the morning.   clopidogrel (PLAVIX) 75 MG tablet Take 1 tablet (75 mg total) by mouth daily with breakfast.   empagliflozin (JARDIANCE) 25 MG TABS tablet Take 25 mg by mouth in the morning.   fluticasone (FLONASE) 50 MCG/ACT nasal spray Place 2 sprays into both nostrils daily as needed for allergies.   isosorbide mononitrate (IMDUR) 60 MG 24 hr tablet Take 1 tablet (60 mg total) by mouth daily.   metoprolol tartrate (LOPRESSOR) 25 MG tablet Take 1 tablet (25 mg total) by mouth 2 (two) times  daily.   nitroGLYCERIN (NITROSTAT) 0.4 MG SL tablet Place 1 tablet (0.4 mg total) under the tongue every 5 (five) minutes as needed. (Patient taking differently: Place 0.4 mg under the tongue every 5 (five) minutes as needed for chest pain.)   OneTouch Delica Lancets 99M MISC 1 each by Other route daily.   ONETOUCH VERIO test strip 1 each by Other route daily.   pantoprazole (PROTONIX) 40 MG tablet Take 1 tablet (40 mg total) by mouth 2 (two) times daily.   PRALUENT 150 MG/ML SOAJ INJECT 150MG  INTO THE SKIN EVERY 14 DAYS (Patient taking differently: Inject 150 mg into the skin every 14 (fourteen) days.)   Tamsulosin HCl (FLOMAX) 0.4 MG CAPS Take 0.4 mg by mouth at bedtime.   No facility-administered encounter medications on file as of 10/11/2021.     Objective:     PHYSICAL EXAMINATION:    VITALS:   Vitals:   10/11/21 0947  BP: (!) 142/83  Pulse: 71  Resp: 18  SpO2: 98%  Weight: 155 lb (70.3 kg)  Height: 5\' 10"  (1.778 m)    GEN:  The patient appears stated age and is in NAD. HEENT:  Normocephalic, atraumatic.   Neurological examination:  General: NAD, well-groomed, appears stated age. Orientation: The patient is alert. Oriented to person, place and date Cranial nerves: There is good facial symmetry.The speech is fluent and clear. No aphasia or dysarthria. Fund of knowledge is appropriate. Recent memory impaired, remote memory intact. Attention normal and concentration mildly reduced.  Able to name objects and repeat phrases.  Hearing is intact to conversational tone.    Sensation: Sensation is intact to light touch throughout Motor: Strength is at least antigravity x4. Tremors: none  DTR's 2/4 in [place] [facility]  05/21/2021  Visuospatial/ Executive (0/5) 4  Naming (0/3) 2  Attention: Read list of digits (0/2) 2  Attention: Read list of letters (0/1) 0  Attention: Serial 7 subtraction starting at 100 (0/3) 3  Language: Repeat phrase (0/2) 2   Language : Fluency (0/1) 0  Abstraction (0/2) 1  Delayed Recall (0/5) 0  Orientation (0/6) 5  Total 19  Adjusted Score (based on education) 20   No flowsheet data found.  No flowsheet data found.     Movement examination: Tone: There is normal tone in the UE/LE Abnormal movements:  no tremor.  No myoclonus.  No asterixis.   Coordination:  There  is no decremation with RAM's. Normal finger to nose  Gait and Station: The patient has no difficulty arising out of a deep-seated chair without the use of the hands. The patient's stride length is good.  Gait is cautious and narrow.        Total time spent on today's visit was 25  minutes, including both face-to-face time and nonface-to-face time. Time included that spent on review of records (prior notes available to me/labs/imaging if pertinent), discussing treatment and goals, answering patient's questions and coordinating care.  Cc:  Ginger Organ., MD Sharene Butters, PA-C

## 2021-12-15 NOTE — Progress Notes (Signed)
Cardiology Office Note:    Date:  12/29/2021   ID:  [name], DOB 1946/10/31, MRN [id number]  PCP:  Ginger Organ., MD   [facility] Providers Cardiologist:  Leon Goodnow [place], MD {   Referring MD: Ginger Organ., MD   Chief Complaint  Patient presents with   Coronary Artery Disease    History of Present Illness:    [name] is a 76 y.o. male with a hx of CAD s/p CABG 12/2019 (LIMA to LAD, SVG to diagonal, SVG to OM1 and SVG to PDA), postop atrial fibrillation, hypertension, hyperlipidemia and DM2.  He had a STEMI in April 2021 that required stenting of OM 2 and distal left circumflex artery, SVG to OM and PDA was occluded, he did have 70% proximal RCA and a diffusely diseased PLA branch that was filled with left-to-right collaterals, this was managed medically.  Postprocedure, he was placed on aspirin and Plavix.  Anticoagulation therapy and amiodarone were discontinued as his A. fib only occurred after bypass surgery.  He was placed on Praluent in May 2021 for hyperlipidemia.  He had underwent another cardiac catheterization in March 2022, this demonstrated restenosis in the stent of the OM this was treated with a scoring balloon angioplasty, proximal RCA was also stented, LAD distal left circumflex artery stent was patent.  Unfortunately patient went back to the hospital on 03/30/2021 with recurrent chest pain.  Serial troponin was only borderline elevated.  His symptom was concerning for unstable angina.  Repeat cardiac catheterization performed on 03/31/2021 showed 80% mid LAD, 100% mid LAD occlusion, 99% OM2 lesion treated with DES x2, EF 55 to 65%, widely patent distal left circumflex stent.  It is recommended he continue dual antiplatelet therapy indefinitely. During this hospitalization, Imdur was increased to 60 mg daily.  Beta-blocker was reduced down to 25 mg twice a day due to bradycardia. When seen back by Almyra Deforest PA-C on 04/19/21 he was doing well.   On follow  up today he is doing very well. He only has chest pain when he does something "stupid". No dyspnea or palpitations.  Was seen by Neurology for his memory loss and had Neurocognitive testing. Wife reports this showed mild impairment. He is very active. Overall feels well. Notes he could not tolerate Ozempic. Scheduled to see Dr Brigitte Pulse this week with lab.   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   Urinary hesitancy     Past Surgical History:  Procedure Laterality Date   ANKLE ARTHROPLASTY  2006   CARDIOVASCULAR STRESS TEST  01/22/2009   EF 51%, NO ISCHEMIA   CARDIOVASCULAR STRESS TEST     COLONOSCOPY     CORONARY ANGIOPLASTY WITH STENT PLACEMENT  03/2005   CORONARY ARTERY BYPASS GRAFT N/A 12/20/2019   Procedure: CORONARY ARTERY BYPASS  GRAFTING (CABG) times four, using left internal mammary artery and left greater saphenous vein;  Surgeon: Ivin Poot, MD;  Location: [place];  Service: Open Heart Surgery;  Laterality: N/A;   CORONARY BALLOON ANGIOPLASTY N/A 02/09/2021   Procedure: CORONARY BALLOON ANGIOPLASTY;  Surgeon: Martinique, Jahlil Ziller M, MD;  Location: [place] [facility];  Service: Cardiovascular;  Laterality: N/A;   CORONARY STENT INTERVENTION N/A 03/16/2020   Procedure: CORONARY STENT INTERVENTION;  Surgeon: Martinique, Emanuel Dowson M, MD;  Location: [place] [facility];  Service: Cardiovascular;  Laterality: N/A;   CORONARY STENT INTERVENTION N/A 02/09/2021   Procedure: CORONARY STENT  INTERVENTION;  Surgeon: Martinique, Samatha Anspach M, MD;  Location: [place] [facility];  Service: Cardiovascular;  Laterality: N/A;   CORONARY STENT INTERVENTION N/A 03/31/2021   Procedure: CORONARY STENT INTERVENTION;  Surgeon: Wellington Hampshire, MD;  Location: [place] [facility];  Service: Cardiovascular;  Laterality: N/A;   CORONARY STENT PLACEMENT  03/10/2005   SUCCESSFUL STENTING OF THE MID AND CRUX OF THE RCA   CORONARY/GRAFT ACUTE MI REVASCULARIZATION N/A 03/16/2020   Procedure: Coronary/Graft Acute MI Revascularization;  Surgeon: Martinique, Christerpher Clos M, MD;  Location: [place] [facility];  Service: Cardiovascular;  Laterality: N/A;   ESOPHAGEAL DILATION     multiple procedures   HERNIA REPAIR  00/93/8182   umbilical hernia   INGUINAL HERNIA REPAIR  11/28/2011   Procedure: HERNIA REPAIR INGUINAL ADULT;  Surgeon: Imogene Burn. Georgette Dover, MD;  Location: [place];  Service: General;  Laterality: Right;  right inguinal hernia repair with mesh   KNEE SURGERY     Right   LEFT HEART CATH AND CORONARY ANGIOGRAPHY N/A 12/03/2019   Procedure: LEFT HEART CATH AND CORONARY ANGIOGRAPHY;  Surgeon: Jettie Booze, MD;  Location: [place] [facility];  Service: Cardiovascular;  Laterality: N/A;   LEFT HEART CATH AND CORS/GRAFTS ANGIOGRAPHY N/A 03/16/2020   Procedure: LEFT HEART CATH AND CORS/GRAFTS ANGIOGRAPHY;  Surgeon: Martinique, Linzi Ohlinger M, MD;  Location: [place] [facility];  Service: Cardiovascular;  Laterality: N/A;   LEFT HEART CATH AND CORS/GRAFTS ANGIOGRAPHY N/A 02/09/2021   Procedure: LEFT HEART CATH AND CORS/GRAFTS ANGIOGRAPHY;  Surgeon: Martinique, Briseis Aguilera M, MD;  Location: [place] [facility];  Service: Cardiovascular;  Laterality: N/A;   LEFT HEART CATH AND CORS/GRAFTS ANGIOGRAPHY N/A 03/31/2021   Procedure: LEFT HEART CATH AND CORS/GRAFTS ANGIOGRAPHY;  Surgeon: Wellington Hampshire, MD;  Location: [place] [facility];  Service: Cardiovascular;  Laterality: N/A;   POLYPECTOMY     TEE WITHOUT CARDIOVERSION N/A 12/20/2019   Procedure:  TRANSESOPHAGEAL ECHOCARDIOGRAM (TEE);  Surgeon: Prescott Gum, Collier Salina, MD;  Location: [place];  Service: Open Heart Surgery;  Laterality: N/A;   TONSILLECTOMY  9937   UMBILICAL HERNIA REPAIR  11/2006   US ECHOCARDIOGRAPHY      Current Medications: Current Meds  Medication Sig   acetaminophen (TYLENOL) 650 MG CR tablet Take 1,300 mg by mouth every 8 (eight) hours as needed for pain.   amLODipine (NORVASC) 5 MG tablet Take 1 tablet (5 mg total) by mouth daily.   aspirin EC 81 MG tablet Take 81 mg by mouth in the morning.   B Complex-C (B-COMPLEX WITH VITAMIN C) tablet Take 1 tablet by mouth daily with breakfast.    Calcium-Magnesium-Zinc (CAL-MAG-ZINC PO) Take 1 tablet by mouth daily with lunch.   cetirizine (ZYRTEC) 10 MG tablet Take 10 mg by mouth in the morning.   clopidogrel (PLAVIX) 75 MG tablet Take 1 tablet (75 mg total) by  mouth daily with breakfast.   empagliflozin (JARDIANCE) 25 MG TABS tablet Take 25 mg by mouth in the morning.   finasteride (PROSCAR) 5 MG tablet Take 5 mg by mouth daily.   fluticasone (FLONASE) 50 MCG/ACT nasal spray Place 2 sprays into both nostrils daily as needed for allergies.   isosorbide mononitrate (IMDUR) 60 MG 24 hr tablet Take 1 tablet (60 mg total) by mouth daily.   metoprolol tartrate (LOPRESSOR) 25 MG tablet Take 1 tablet (25 mg total) by mouth 2 (two) times daily.   nitroGLYCERIN (NITROSTAT) 0.4 MG SL tablet Place 1 tablet (0.4 mg total) under the tongue every 5 (five) minutes as needed. (Patient taking differently: Place 0.4 mg under the tongue every 5 (five) minutes as needed for chest pain.)   OneTouch Delica Lancets 85O MISC 1 each by Other route daily.   ONETOUCH VERIO test strip 1 each by Other route daily.   pantoprazole (PROTONIX) 40 MG tablet Take 1 tablet (40 mg total) by mouth 2 (two) times daily.   Tamsulosin HCl (FLOMAX) 0.4 MG CAPS Take 0.4 mg by mouth at bedtime.   [DISCONTINUED] PRALUENT 150 MG/ML SOAJ INJECT 150MG  INTO THE SKIN EVERY 14  DAYS (Patient taking differently: Inject 150 mg into the skin every 14 (fourteen) days.)     Allergies:   Kiwi extract, Statins, Bismuth subsalicylate, Codeine, Morphine and related, Tape, and Altace [ramipril]   Social History   Socioeconomic History   Marital status: Married    Spouse name: Not on file   Number of children: 3   Years of education: Not on file   Highest education level: Not on file  Occupational History   Occupation: Retired  Tobacco Use   Smoking status: Former    Packs/day: 1.00    Years: 22.00    Pack years: 22.00    Types: Cigarettes, Pipe    Quit date: 12/17/2014    Years since quitting: 7.0   Smokeless tobacco: Never  Substance and Sexual Activity   Alcohol use: No    Alcohol/week: 0.0 standard drinks   Drug use: No   Sexual activity: Not on file  Other Topics Concern   Not on file  Social History Narrative   Right handed   Drinks caffeine   One story home   Social Determinants of Health   Financial Resource Strain: Not on file  Food Insecurity: Not on file  Transportation Needs: Not on file  Physical Activity: Not on file  Stress: Not on file  Social Connections: Not on file     Family History: The patient's family history includes Cancer in his paternal grandmother; Heart disease in his brother, maternal grandfather, mother, and sister; Ovarian cancer in his paternal grandmother. There is no history of Colon cancer, Esophageal cancer, or Stomach cancer.  ROS:   Please see the history of present illness.     All other systems reviewed and are negative.  EKGs/Labs/Other Studies Reviewed:    The following studies were reviewed today:  Cath 03/31/2021 Mid LAD-1 lesion is 80% stenosed. Prox LAD lesion is 25% stenosed. Mid LAD-2 lesion is 100% stenosed. 2nd Mrg-2 lesion is 99% stenosed. RPAV lesion is 99% stenosed. Mid RCA lesion is 30% stenosed. Dist RCA lesion is 30% stenosed. Previously placed Prox RCA drug eluting stent is widely  patent. The left ventricular systolic function is normal. LV end diastolic pressure is mildly elevated. The left ventricular ejection fraction is 55-65% by visual estimate. Previously placed Dist Cx stent (unknown  type) is widely patent. Mid Cx to Dist Cx lesion is 30% stenosed. Post intervention, there is a 0% residual stenosis. A drug-eluting stent was successfully placed using a SYNERGY XD 2.50X12. 2nd Mrg-1 lesion is 90% stenosed. Post intervention, there is a 0% residual stenosis. A drug-eluting stent was successfully placed using a SYNERGY XD 2.75X24. Lat 2nd Mrg lesion is 50% stenosed.   1.  Significant underlying three-vessel coronary artery disease with patent LIMA to LAD and patent SVG to diagonal.  Chronically occluded SVG to OM 2 and SVG to right PDA.  Patent RCA stents with mild in-stent restenosis in the mid and distal stents.  Patent distal left circumflex stent with minimal restenosis.  Severe in-stent restenosis in the proximal as well as distal stent in a large OM2. 2.  Normal LV systolic function mildly elevated left ventricular end-diastolic pressure. 3.  Successful OCT guided PCI and drug-eluting stent placement to the distal and proximal OM2.  The distal stent did not cover the whole length of the previously placed stent as I was concerned about small vessel size distally and in order not to jail the inferior branch which was diseased.  I elected to restent with different platform given the quick restenosis after recent cath and balloon angioplasty.   Recommendations: Continue dual antiplatelet therapy indefinitely if possible. Aggressive treatment of risk factors. Hydrate overnight given underlying chronic kidney disease.   EKG:  EKG is not ordered today.    Recent Labs: 03/26/2021: ALT 12 03/31/2021: Magnesium 2.0 04/01/2021: BUN 18; Creatinine, Ser 1.28; Hemoglobin 11.7; Platelets 156; Potassium 4.1; Sodium 138 05/21/2021: TSH 2.03  Recent Lipid Panel    Component  Value Date/Time   CHOL 109 03/31/2021 0240   CHOL 122 02/01/2021 1136   TRIG 143 03/31/2021 0240   HDL 46 03/31/2021 0240   HDL 53 02/01/2021 1136   CHOLHDL 2.4 03/31/2021 0240   VLDL 29 03/31/2021 0240   LDLCALC 34 03/31/2021 0240   LDLCALC 50 02/01/2021 1136   Dated 04/29/21: cholesterol 110, triglycerides 83, HDL 50, LDL 43.  Dated 09/09/21: A1c 8.3%/   Risk Assessment/Calculations:    CHA2DS2-VASc Score = 5  This indicates a 7.2% annual risk of stroke. The patient's score is based upon: CHF History: 0 HTN History: 1 Diabetes History: 1 Stroke History: 0 Vascular Disease History: 1 Age Score: 2 Gender Score: 0      Physical Exam:    VS:  BP 115/60    Pulse (!) 57    Ht 5' 9.05" (1.754 m)    Wt 157 lb 6.4 oz (71.4 kg)    SpO2 98%    BMI 23.21 kg/m     Wt Readings from Last 3 Encounters:  12/29/21 157 lb 6.4 oz (71.4 kg)  10/11/21 155 lb (70.3 kg)  05/24/21 158 lb 9.6 oz (71.9 kg)     GEN:  Well nourished, well developed in no acute distress HEENT: Normal NECK: No JVD; No carotid bruits LYMPHATICS: No lymphadenopathy CARDIAC: RRR, no murmurs, rubs, gallops RESPIRATORY:  Clear to auscultation without rales, wheezing or rhonchi  ABDOMEN: Soft, non-tender, non-distended MUSCULOSKELETAL:  No edema; No deformity  SKIN: Warm and dry NEUROLOGIC:  Alert and oriented x 3 PSYCHIATRIC:  Normal affect   ASSESSMENT:    1. S/P CABG x 4   2. PAF (paroxysmal atrial fibrillation) (HCC) Chronic  3. Controlled type 2 diabetes mellitus without complication, without long-term current use of insulin (HCC) Chronic  4. 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 Hypercholesterolemia     PLAN:    In order of problems listed above:  CAD s/p CABG 12/2019 with subsequent PCI/DES to OM2 x2 and LCx x1 03/16/20 with cardiac cath showing occlusion of SVG to OM1 and SVG to PDA: patent LIMA to LAD and SVG to diagonal, s/p scoring  balloon PCI of the OM2 for restenosis on 02/09/21. PCI of the proximal RCA with DES.  Then underwent DES x2 to distal and proximal OM 2 in late April 2022 for restenosis.   He has done well since then with infrequent angina.  Continue aspirin and Plavix indefinitely.  On Imdur, amlodipine and beta blocker.   Postop atrial fibrillation: Occurred after the previous bypass surgery.  No recurrence  Hypertension: Blood pressure is well controlled.  Hyperlipidemia: On Praluent - labs per Dr Brigitte Pulse.  DM2: On Jardiance- per PCP  6.   Memory loss.   I will follow up in 6 months.   Signed, Tee Richeson Martinique, MD  12/29/2021 11:40 AM    [place]

## 2021-12-29 ENCOUNTER — Other Ambulatory Visit: Payer: Self-pay

## 2021-12-29 ENCOUNTER — Ambulatory Visit: Payer: BC Managed Care – PPO | Admitting: Cardiology

## 2021-12-29 ENCOUNTER — Encounter: Payer: Self-pay | Admitting: Cardiology

## 2021-12-29 ENCOUNTER — Other Ambulatory Visit: Payer: Self-pay | Admitting: Pharmacist

## 2021-12-29 VITALS — BP 115/60 | HR 57 | Ht 69.05 in | Wt 157.4 lb

## 2021-12-29 DIAGNOSIS — I480 Paroxysmal atrial fibrillation: Secondary | ICD-10-CM

## 2021-12-29 DIAGNOSIS — E119 Type 2 diabetes mellitus without complications: Secondary | ICD-10-CM

## 2021-12-29 DIAGNOSIS — Z951 Presence of aortocoronary bypass graft: Secondary | ICD-10-CM

## 2021-12-29 DIAGNOSIS — E7800 Pure hypercholesterolemia, unspecified: Secondary | ICD-10-CM

## 2021-12-29 DIAGNOSIS — I25708 Atherosclerosis of coronary artery bypass graft(s), unspecified, with other forms of angina pectoris: Secondary | ICD-10-CM

## 2021-12-29 MED ORDER — CLOPIDOGREL BISULFATE 75 MG PO TABS: 75.0000 mg | ORAL_TABLET | Freq: Every day | ORAL | 3 refills | Status: DC

## 2021-12-29 MED ORDER — ISOSORBIDE MONONITRATE ER 60 MG PO TB24: 60.0000 mg | ORAL_TABLET | Freq: Every day | ORAL | 3 refills | Status: DC

## 2021-12-29 MED ORDER — PANTOPRAZOLE SODIUM 40 MG PO TBEC: 40.0000 mg | DELAYED_RELEASE_TABLET | Freq: Two times a day (BID) | ORAL | 3 refills | Status: DC

## 2021-12-29 MED ORDER — AMLODIPINE BESYLATE 5 MG PO TABS: 5.0000 mg | ORAL_TABLET | Freq: Every day | ORAL | 3 refills | Status: DC

## 2021-12-29 MED ORDER — METOPROLOL TARTRATE 25 MG PO TABS: 25.0000 mg | ORAL_TABLET | Freq: Two times a day (BID) | ORAL | 3 refills | Status: DC

## 2021-12-29 MED ORDER — PRALUENT 150 MG/ML ~~LOC~~ SOAJ: 150.0000 mg | SUBCUTANEOUS | 11 refills | Status: DC

## 2021-12-29 NOTE — Addendum Note (Signed)
Addended by: Kathyrn Lass on: 12/29/2021 11:49 AM   Modules accepted: Orders

## 2022-04-11 ENCOUNTER — Ambulatory Visit: Payer: BC Managed Care – PPO | Admitting: Physician Assistant

## 2022-06-13 ENCOUNTER — Encounter: Payer: Self-pay | Admitting: Physician Assistant

## 2022-06-13 ENCOUNTER — Ambulatory Visit: Payer: BC Managed Care – PPO | Admitting: Physician Assistant

## 2022-06-13 VITALS — BP 125/67 | HR 66 | Resp 18 | Ht 68.0 in | Wt 154.0 lb

## 2022-06-13 DIAGNOSIS — G3184 Mild cognitive impairment, so stated: Secondary | ICD-10-CM

## 2022-06-13 DIAGNOSIS — F0280 Dementia in other diseases classified elsewhere without behavioral disturbance: Secondary | ICD-10-CM

## 2022-06-13 DIAGNOSIS — G309 Alzheimer's disease, unspecified: Secondary | ICD-10-CM | POA: Diagnosis not present

## 2022-06-13 MED ORDER — MEMANTINE HCL 5 MG PO TABS: ORAL_TABLET | ORAL | 11 refills | Status: DC

## 2022-06-13 NOTE — Progress Notes (Signed)
Assessment/Plan:   Mild Cognitive Impairment   [name] is a very pleasant 76 y.o. RH male  with a history of  hypertension, hyperlipidemia, chronic bradycardia, DM2, CAD s/p CABG p STEMI, PAF, with a diagnosis of MCI be neuropsychological testing on 09/13/21, seen today in follow up.  Last MoCA on 05/21/2021 was 20/30.  Today's MMSE is 22/30, with worsening orientation, and delayed recall 0/3.  Patient is not on antidementia medication at this time.  Start memantine 5 mg, take 1 p.o. nightly, then increase to 1 p.o. twice daily if tolerated.  Side effects were discussed. Repeat neurocognitive testing in 12 months for clarity of the diagnosis and disease trajectory Follow-up in 6 months Monitor closely cardiovascular risk factors Monitor sugars closely, as per PCP Continue to monitor mood as per PCP   Case discussed with Dr. Delice Lesch who agrees with the plan     Subjective:   This patient is accompanied in the office by his who supplements the history.  Previous records as well as any outside records available were reviewed prior to todays visit.  Patient was last seen at our office on 10/11/2021 at which time his MoCA was 20/30.  He is not on antidementia medication.  Neuropsychological evaluation at the time yielded a diagnosis of mild cognitive impairment.   Any changes in memory since last visit? Memory is worse, "I used to have a photographic memory, now I cannot remember the list of things to do, errands to do ".  His wife reports that this is worse during the day later part of the day. His wife reports that he does not do anything other than playing solitaire, does not participate in activities outside of the house "every day is the same for me ". Patient lives with: Wife repeats oneself?  Endorsed.  He frequently asked the same questions. Disoriented when walking into a room?  He is more confused if woken up.  Otherwise, he is not disoriented when walking into a  room. Leaving objects in unusual places?  He forgets where he lives staff, but does not place it in unusual locations.   Ambulates  with difficulty?   Patient denies   Recent falls?  Patient denies   Any head injuries?  Patient denies   History of seizures?   Patient denies   Wandering behavior?  Patient denies   Patient drives?   He only drives short distances, denies getting lost. Any mood changes such irritability agitation?  There are moments in which he may get frustrated with himself  Hallucinations?  Patient denies   Paranoia?  His wife reports that he may be accusatory at times, such as "she took my wallet ". Patient reports that he sleeps well with vivid dreams ("he always dreams a lot "), without significant REM behavior or sleepwalking   History of sleep apnea?  Patient denies   Any hygiene concerns?  Patient denies   Independent of bathing and dressing?  Endorsed  Does the patient needs help with medications?  She places the medications in a container, he takes them. Who is in charge of the finances?  Wife pays the bills electronically, but he can still write checks  Any changes in appetite?  "Nothing is too drastic ".  He endorses not taking enough water. Patient have trouble swallowing? Patient denies   Does the patient cook?  Patient denies   Any kitchen accidents such as leaving the stove on? Patient denies   Any headaches?  Patient denies   Double vision? Patient denies   Any focal numbness or tingling?  Patient denies   Chronic back pain Patient denies   Unilateral weakness?  Patient denies   Any tremors?  Patient denies   Any history of anosmia?  Patient denies   Any incontinence of urine?  Patient has a history of BPH, he has frequent urination with residual. Any bowel dysfunction?   Patient denies       Initial Consult 05/21/21 The patient is seen in neurologic consultation at the request of Martinique, Peter M, MD for the evaluation of memory.  The patient is  accompanied by wife who supplements the history.  He is a 76 year old male who had memory issues for about 2 years, although he states that after his first MI and CABG in 2021 he began to notice "little things, such as losing more frequently that he is, or coming up with the name of an object or another name".  After his second MII, his symptoms appeared to worsen, with significant difficulty in recalling, although "stress makes it worse ".  His wife reports that when they are due to being alone, these memory issues are less evident, "he could almost make it to 100% ".  However, while they are surrounded by a crowd, he becomes easily distracted, and he is unable to recall, especially if some new information would have been presented a few minutes earlier, and trying to remember them 5 minutes later.  He reports that he may have undiagnosed ADHD.  He states that when growing up, the teachers would always reprimanded him for not paying attention, dropping out of  High School,  because he could not concentrate.  However, at home he could read the [employer], "tons of magazines and books ", and also learning to do a lot of manual activities.   He held numerous "hands-on jobs, including being a Engineer, drilling, Games developer, Dealer, working in the [employer], being a Actuary ".  The wife says that he would become bored with one activity, and try something new.  Currently, he likes to do yard work, cutting grass "2 acres of it ".  He also likes to play [employer], Chief Strategy Officer, board games.   His mood is overall good, without depression or irritability.  He states that he did very well during the pandemic, was not feeling lonely.  He sleeps about 6 hours at night, and always had vivid dreams, no changes in the pattern.  He denies sleepwalking. He is able to do all his activities of daily living, such as bathing, dressing up, taking his medications without missing doses, or driving without getting lost.  He  drives with a GPS but not afraid of getting lost, driving long distances without any issues.  He does his own finances without missing any payments.  "In fact, I had to remind my wife to pay on  time ".  He does not cook.  He used to have headaches as a youngster, but since moving from [place] to [place], the last 34 years he has not had any migraine headaches.   He denies any trauma to the head, double vision, dizziness, focal numbness or tingling, unilateral weakness or tremors.  He has a history of BPH, reporting frequent urination with residual, and has to be seen by urology soon.  He has an appointment with them.  He denies any constipation or diarrhea.  Denies any history of sleep apnea, alcohol, he has remote  tobacco history, quitting a 22 pack year in 2016.  Family history is negative for dementia. 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     CT head wo contrast 12/18/19  Mild chronic ischemic white matter disease. No acute intracranial abnormality seen.     Neuropsychological Evaluation, Dr. Nicole Kindred 09/13/21 "Mr. Lufkin is thus demonstrating likely memory storage problems and other findings suggestive of temporal lobe dysfunction as can often be observed in age-related progressive causes of cognitive decline. The fact that he has experienced altered mental status after each of his operations is also concerning given that having an underlying condition is a risk factor for delirium (and delirium in turn is a risk factor for dementia). He should be followed over time.  [name] was seen for a psychiatric diagnostic evaluation and neuropsychological testing. He is a 76 year old, right-hand dominant man with a significant amount of central and mesial temporal volume loss on neuroimaging. His evans ratio is abnormal but he has no signs and symptoms of hydrocephalus and I do think this may be ex vacuo (it was not noted by radiology). On cognitive screening he is in the MCI  range with amnestic type difficulties and preliminary review of test data suggest that neuropsychological findings are likely to demonstrate a similar pattern"    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   Urinary hesitancy      Past Surgical History:  Procedure Laterality Date   ANKLE ARTHROPLASTY  2006   CARDIOVASCULAR STRESS TEST  01/22/2009   EF 51%, NO ISCHEMIA   CARDIOVASCULAR STRESS TEST     COLONOSCOPY     CORONARY ANGIOPLASTY WITH STENT PLACEMENT  03/2005   CORONARY ARTERY BYPASS GRAFT N/A 12/20/2019   Procedure: CORONARY ARTERY BYPASS GRAFTING (CABG) times four, using left internal mammary artery and left greater saphenous vein;  Surgeon: Ivin Poot, MD;  Location: [facility];  Service: Open Heart Surgery;  Laterality: N/A;   CORONARY BALLOON ANGIOPLASTY N/A 02/09/2021   Procedure: CORONARY BALLOON ANGIOPLASTY;  Surgeon: Martinique, Peter M, MD;  Location: [facility];  Service: Cardiovascular;  Laterality: N/A;   CORONARY STENT INTERVENTION N/A 03/16/2020   Procedure: CORONARY STENT INTERVENTION;  Surgeon: Martinique, Peter M, MD;  Location: [facility];  Service: Cardiovascular;  Laterality: N/A;   CORONARY STENT INTERVENTION N/A 02/09/2021   Procedure: CORONARY STENT INTERVENTION;  Surgeon: Martinique, Peter M, MD;  Location: [facility];  Service: Cardiovascular;  Laterality: N/A;   CORONARY STENT INTERVENTION N/A 03/31/2021   Procedure: CORONARY STENT  INTERVENTION;  Surgeon: Wellington [place], MD;  Location: [facility];  Service: Cardiovascular;  Laterality: N/A;   CORONARY STENT PLACEMENT  03/10/2005   SUCCESSFUL STENTING OF THE MID AND CRUX OF THE RCA   CORONARY/GRAFT ACUTE MI  REVASCULARIZATION N/A 03/16/2020   Procedure: Coronary/Graft Acute MI Revascularization;  Surgeon: Martinique, Peter M, MD;  Location: [facility];  Service: Cardiovascular;  Laterality: N/A;   ESOPHAGEAL DILATION     multiple procedures   HERNIA REPAIR  51/88/4166   umbilical hernia   INGUINAL HERNIA REPAIR  11/28/2011   Procedure: HERNIA REPAIR INGUINAL ADULT;  Surgeon: Imogene Burn. Georgette Dover, MD;  Location: [facility];  Service: General;  Laterality: Right;  right inguinal hernia repair with mesh   KNEE SURGERY     Right   LEFT HEART CATH AND CORONARY ANGIOGRAPHY N/A 12/03/2019   Procedure: LEFT HEART CATH AND CORONARY ANGIOGRAPHY;  Surgeon: Jettie Booze, MD;  Location: [facility];  Service: Cardiovascular;  Laterality: N/A;   LEFT HEART CATH AND CORS/GRAFTS ANGIOGRAPHY N/A 03/16/2020   Procedure: LEFT HEART CATH AND CORS/GRAFTS ANGIOGRAPHY;  Surgeon: Martinique, Peter M, MD;  Location: [facility];  Service: Cardiovascular;  Laterality: N/A;   LEFT HEART CATH AND CORS/GRAFTS ANGIOGRAPHY N/A 02/09/2021   Procedure: LEFT HEART CATH AND CORS/GRAFTS ANGIOGRAPHY;  Surgeon: Martinique, Peter M, MD;  Location: [facility];  Service: Cardiovascular;  Laterality: N/A;   LEFT HEART CATH AND CORS/GRAFTS ANGIOGRAPHY N/A 03/31/2021   Procedure: LEFT HEART CATH AND CORS/GRAFTS ANGIOGRAPHY;  Surgeon: Wellington [place], MD;  Location: [facility];  Service: Cardiovascular;  Laterality: N/A;   POLYPECTOMY     TEE WITHOUT CARDIOVERSION N/A 12/20/2019   Procedure: TRANSESOPHAGEAL ECHOCARDIOGRAM (TEE);  Surgeon: Prescott Gum, Collier Salina, MD;  Location: [facility];  Service: Open Heart Surgery;  Laterality: N/A;   TONSILLECTOMY  0630   UMBILICAL HERNIA REPAIR  11/2006    US ECHOCARDIOGRAPHY       PREVIOUS MEDICATIONS:   CURRENT MEDICATIONS:  Outpatient Encounter Medications as of 06/13/2022  Medication Sig   acetaminophen (TYLENOL) 650 MG CR tablet Take 1,300 mg by mouth every 8 (eight) hours as needed for pain.   Alirocumab (PRALUENT) 150 MG/ML SOAJ Inject 150 mg into the skin every 14 (fourteen) days.   amLODipine (NORVASC) 5 MG tablet Take 1 tablet (5 mg total) by mouth daily.   aspirin EC 81 MG tablet Take 81 mg by mouth in the morning.   B Complex-C (B-COMPLEX WITH VITAMIN C) tablet Take 1 tablet by mouth daily with breakfast.    Calcium-Magnesium-Zinc (CAL-MAG-ZINC PO) Take 1 tablet by mouth daily with lunch.   cetirizine (ZYRTEC) 10 MG tablet Take 10 mg by mouth in the morning.   clopidogrel (PLAVIX) 75 MG tablet Take 1 tablet (75 mg total) by mouth daily with breakfast.   empagliflozin (JARDIANCE) 25 MG TABS tablet Take 25 mg by mouth in the morning.   finasteride (PROSCAR) 5 MG tablet Take 5 mg by mouth daily.   fluticasone (FLONASE) 50 MCG/ACT nasal spray Place 2 sprays into both nostrils daily as needed for allergies.   isosorbide mononitrate (IMDUR) 60 MG 24 hr tablet Take 1 tablet (60 mg total) by mouth daily.   memantine (NAMENDA) 5 MG tablet Take 1 tablet (5 mg at night) for 2 weeks, then increase to 1 tablet (5 mg) twice a day   metoprolol tartrate (LOPRESSOR) 25 MG tablet Take 1 tablet (25 mg total) by mouth 2 (two) times daily.   nitroGLYCERIN (NITROSTAT) 0.4 MG SL tablet Place 1 tablet (0.4 mg total) under the tongue every 5 (five) minutes as needed. (Patient taking differently: Place 0.4 mg under the tongue every 5 (five) minutes  as needed for chest pain.)   OneTouch Delica Lancets 38V MISC 1 each by Other route daily.   ONETOUCH VERIO test strip 1 each by Other route daily.   pantoprazole (PROTONIX) 40 MG tablet Take 1 tablet (40 mg total) by mouth 2 (two) times daily.   Tamsulosin HCl (FLOMAX) 0.4 MG CAPS Take 0.4 mg by mouth at  bedtime.   No facility-administered encounter medications on file as of 06/13/2022.     Objective:     PHYSICAL EXAMINATION:    VITALS:   Vitals:   06/13/22 1259  BP: 125/67  Pulse: 66  Resp: 18  SpO2: 98%  Weight: 154 lb (69.9 kg)  Height: '5\' 8"'$  (1.727 m)    GEN:  The patient appears stated age and is in NAD. HEENT:  Normocephalic, atraumatic.   Neurological examination:  General: NAD, well-groomed, appears stated age. Orientation: The patient is alert. Oriented to person, place and date Cranial nerves: There is good facial symmetry.The speech is fluent and clear. No aphasia or dysarthria. Fund of knowledge is appropriate. Recent memory impaired and remote memory is normal.  Attention and concentration are normal.  Able to name objects and repeat phrases.  Hearing is intact to conversational tone.    Sensation: Sensation is intact to light touch throughout Motor: Strength is at least antigravity x4. Tremors: none  DTR's 2/4 in UE/LE      05/21/2021    2:00 PM  Montreal Cognitive Assessment   Visuospatial/ Executive (0/5) 4  Naming (0/3) 2  Attention: Read list of digits (0/2) 2  Attention: Read list of letters (0/1) 0  Attention: Serial 7 subtraction starting at 100 (0/3) 3  Language: Repeat phrase (0/2) 2  Language : Fluency (0/1) 0  Abstraction (0/2) 1  Delayed Recall (0/5) 0  Orientation (0/6) 5  Total 19  Adjusted Score (based on education) 20       06/13/2022    2:00 PM  MMSE - Mini Mental State Exam  Orientation to time 0  Orientation to Place 5  Registration 3  Attention/ Calculation 5  Recall 0  Language- name 2 objects 2  Language- repeat 1  Language- follow 3 step command 3  Language- read & follow direction 1  Write a sentence 1  Copy design 1  Total score 22       Movement examination: Tone: There is normal tone in the UE/LE Abnormal movements:  no tremor.  No myoclonus.  No asterixis.   Coordination:  There is no decremation with  RAM's. Normal finger to nose  Gait and Station: The patient has no difficulty arising out of a deep-seated chair without the use of the hands. The patient's stride length is good.  Gait is cautious and narrow.   Thank you for allowing [place] the opportunity to participate in the care of this nice patient. Please do not hesitate to contact us for any questions or concerns.   Total time spent on today's visit was 40 minutes dedicated to this patient today, preparing to see patient, examining the patient, ordering tests and/or medications and counseling the patient, documenting clinical information in the EHR or other health record, independently interpreting results and communicating results to the patient/family, discussing treatment and goals, answering patient's questions and coordinating care.  Cc:  Ginger Organ., MD  Sharene Butters 06/13/2022 2:05 PM   Cc:  Ginger Organ., MD Sharene Butters, PA-C

## 2022-06-13 NOTE — Patient Instructions (Addendum)
It was a pleasure to see you today at our office.   Recommendations:  Follow up in 6  months Repeat the neurocognitive testing in 12 months  Start Memantine '5mg'$  tablets.  Take 1 tablet at bedtime for 2 weeks, then 1 tablet twice daily.   Side effects include dizziness, headache, constipation.  Call if severe symptoms appear   Whom to call:  Memory  decline, memory medications: Call our office [phone]   For psychiatric meds, mood meds: Please have your primary care physician manage these medications.   Counseling regarding caregiver distress, including caregiver depression, anxiety and issues regarding community resources, adult day care programs, adult living facilities, or memory care questions:   Feel free to contact Dev[place], Social Worker at [phone]   For assessment of decision of mental capacity and competency:  Call Dr. Anthoney Harada, geriatric psychiatrist at [phone]  For guidance in geriatric dementia issues please call [facility] [phone]  For guidance regarding [facility] and if placement were needed at the facility, contact Arnell Asal, Social Worker tel: [phone]  If you have any severe symptoms of a stroke, or other severe issues such as confusion,severe chills or fever, etc call 911 or go to the ER as you may need to be evaluated further   Feel free to visit Facebook page " [email or web address]" for tips of how to care for people with memory problems.        RECOMMENDATIONS FOR ALL PATIENTS WITH MEMORY PROBLEMS: 1. Continue to exercise (Recommend 30 minutes of walking everyday, or 3 hours every week) 2. Increase social interactions - continue going to [place] and enjoy social gatherings with friends and family 3. Eat healthy, avoid fried foods and eat more fruits and vegetables 4. Maintain adequate blood pressure, blood sugar, and blood cholesterol level. Reducing the risk of stroke and cardiovascular disease  also helps promoting better memory. 5. Avoid stressful situations. Live a simple life and avoid aggravations. Organize your time and prepare for the next day in anticipation. 6. Sleep well, avoid any interruptions of sleep and avoid any distractions in the bedroom that may interfere with adequate sleep quality 7. Avoid sugar, avoid sweets as there is a strong link between excessive sugar intake, diabetes, and cognitive impairment We discussed the Mediterranean diet, which has been shown to help patients reduce the risk of progressive memory disorders and reduces cardiovascular risk. This includes eating fish, eat fruits and green leafy vegetables, nuts like almonds and hazelnuts, walnuts, and also use olive oil. Avoid fast foods and fried foods as much as possible. Avoid sweets and sugar as sugar use has been linked to worsening of memory function.  There is always a concern of gradual progression of memory problems. If this is the case, then we may need to adjust level of care according to patient needs. Support, both to the patient and caregiver, should then be put into place.    The [employer] Association is here all day, every day for people facing Alzheimer's disease through our free 24/7 Helpline: [phone]. The Helpline provides reliable information and support to all those who need assistance, such as individuals living with memory loss, Alzheimer's or other dementia, caregivers, health care professionals and the public.  Our highly trained and knowledgeable staff can help you with: Understanding memory loss, dementia and Alzheimer's  Medications and other treatment options  General information about aging and brain health  Skills to provide quality care and to find the best care  from Training and development officer, financial and living-arrangement decisions Our Helpline also features: Confidential care consultation provided by master's level clinicians who can help with decision-making support,  crisis assistance and education on issues families face every day  Help in a caller's preferred language using our translation service that features more than 200 languages and dialects  Referrals to local community programs, services and ongoing support     FALL PRECAUTIONS: Be cautious when walking. Scan the area for obstacles that may increase the risk of trips and falls. When getting up in the mornings, sit up at the edge of the bed for a few minutes before getting out of bed. Consider elevating the bed at the head end to avoid drop of blood pressure when getting up. Walk always in a well-lit room (use night lights in the walls). Avoid area rugs or power cords from appliances in the middle of the walkways. Use a walker or a cane if necessary and consider physical therapy for balance exercise. Get your eyesight checked regularly.  FINANCIAL OVERSIGHT: Supervision, especially oversight when making financial decisions or transactions is also recommended.  HOME SAFETY: Consider the safety of the kitchen when operating appliances like stoves, microwave oven, and blender. Consider having supervision and share cooking responsibilities until no longer able to participate in those. Accidents with firearms and other hazards in the house should be identified and addressed as well.   ABILITY TO BE LEFT ALONE: If patient is unable to contact 911 operator, consider using LifeLine, or when the need is there, arrange for someone to stay with patients. Smoking is a fire hazard, consider supervision or cessation. Risk of wandering should be assessed by caregiver and if detected at any point, supervision and safe proof recommendations should be instituted.  MEDICATION SUPERVISION: Inability to self-administer medication needs to be constantly addressed. Implement a mechanism to ensure safe administration of the medications.   DRIVING: Regarding driving, in patients with progressive memory problems, driving will  be impaired. We advise to have someone else do the driving if trouble finding directions or if minor accidents are reported. Independent driving assessment is available to determine safety of driving.   If you are interested in the driving assessment, you can contact the following:  [employer] in [place]  [place] [phone] or [phone]      [place] refers to food and lifestyle choices that are based on the traditions of countries located on the [employer]. This way of eating has been shown to help prevent certain conditions and improve outcomes for people who have chronic diseases, like kidney disease and heart disease. What are tips for following this plan? Lifestyle  Cook and eat meals together with your family, when possible. Drink enough fluid to keep your urine clear or pale yellow. Be physically active every day. This includes: Aerobic exercise like running or swimming. Leisure activities like gardening, walking, or housework. Get 7-8 hours of sleep each night. If recommended by your health care provider, drink red wine in moderation. This means 1 glass a day for nonpregnant women and 2 glasses a day for men. A glass of wine equals 5 oz (150 mL). Reading food labels  Check the serving size of packaged foods. For foods such as rice and pasta, the serving size refers to the amount of cooked product, not dry. Check the total fat in packaged foods. Avoid foods that have saturated  fat or trans fats. Check the ingredients list for added sugars, such as corn syrup. Shopping  At the grocery store, buy most of your food from the areas near the walls of the store. This includes: Fresh fruits and vegetables (produce). Grains, beans, nuts, and seeds. Some of these may be available in unpackaged forms or large amounts (in  bulk). Fresh seafood. Poultry and eggs. Low-fat dairy products. Buy whole ingredients instead of prepackaged foods. Buy fresh fruits and vegetables in-season from local farmers markets. Buy frozen fruits and vegetables in resealable bags. If you do not have access to quality fresh seafood, buy precooked frozen shrimp or canned fish, such as tuna, salmon, or sardines. Buy small amounts of raw or cooked vegetables, salads, or olives from the deli or salad bar at your store. Stock your pantry so you always have certain foods on hand, such as olive oil, canned tuna, canned tomatoes, rice, pasta, and beans. Cooking  Cook foods with extra-virgin olive oil instead of using butter or other vegetable oils. Have meat as a side dish, and have vegetables or grains as your main dish. This means having meat in small portions or adding small amounts of meat to foods like pasta or stew. Use beans or vegetables instead of meat in common dishes like chili or lasagna. Experiment with different cooking methods. Try roasting or broiling vegetables instead of steaming or sauteing them. Add frozen vegetables to soups, stews, pasta, or rice. Add nuts or seeds for added healthy fat at each meal. You can add these to yogurt, salads, or vegetable dishes. Marinate fish or vegetables using olive oil, lemon juice, garlic, and fresh herbs. Meal planning  Plan to eat 1 vegetarian meal one day each week. Try to work up to 2 vegetarian meals, if possible. Eat seafood 2 or more times a week. Have healthy snacks readily available, such as: Vegetable sticks with hummus. Greek yogurt. Fruit and nut trail mix. Eat balanced meals throughout the week. This includes: Fruit: 2-3 servings a day Vegetables: 4-5 servings a day Low-fat dairy: 2 servings a day Fish, poultry, or lean meat: 1 serving a day Beans and legumes: 2 or more servings a week Nuts and seeds: 1-2 servings a day Whole grains: 6-8 servings a day Extra-virgin  olive oil: 3-4 servings a day Limit red meat and sweets to only a few servings a month What are my food choices? Mediterranean diet Recommended Grains: Whole-grain pasta. Brown rice. Bulgar wheat. Polenta. Couscous. Whole-wheat bread. [place]. Vegetables: Artichokes. Beets. Broccoli. Cabbage. Carrots. Eggplant. Green beans. Chard. Kale. Spinach. Onions. Leeks. Peas. Squash. Tomatoes. Peppers. Radishes. Fruits: Apples. Apricots. Avocado. Berries. Bananas. Cherries. Dates. Figs. Grapes. Lemons. Melon. Oranges. Peaches. Plums. Pomegranate. Meats and other protein foods: Beans. Almonds. Sunflower seeds. Pine nuts. Peanuts. [place]. Salmon. Scallops. Shrimp. [place]. Tilapia. Clams. Oysters. Eggs. Dairy: Low-fat milk. Cheese. Greek yogurt. Beverages: Water. Red wine. Herbal tea. Fats and oils: Extra virgin olive oil. Avocado oil. Grape seed oil. Sweets and desserts: [place] yogurt with honey. Baked apples. Poached pears. Trail mix. Seasoning and other foods: Basil. Cilantro. Coriander. Cumin. Mint. Parsley. Sage. Rosemary. Tarragon. Garlic. Oregano. Thyme. Pepper. Balsalmic vinegar. Tahini. Hummus. Tomato sauce. Olives. Mushrooms. Limit these Grains: Prepackaged pasta or rice dishes. Prepackaged cereal with added sugar. Vegetables: Deep fried potatoes (french fries). Fruits: Fruit canned in syrup. Meats and other protein foods: Beef. Pork. Lamb. Poultry with skin. Hot dogs. Bernie[place] Salines. Dairy: Ice cream. Sour cream. Whole milk. Beverages: Juice. Sugar-sweetened soft drinks. Beer. Liquor and  spirits. Fats and oils: Butter. Canola oil. Vegetable oil. Beef fat (tallow). Lard. Sweets and desserts: Cookies. Cakes. Pies. Candy. Seasoning and other foods: Mayonnaise. Premade sauces and marinades. The items listed may not be a complete list. Talk with your dietitian about what dietary choices are right for you. Summary The Mediterranean diet includes both food and lifestyle choices. Eat a variety of fresh  fruits and vegetables, beans, nuts, seeds, and whole grains. Limit the amount of red meat and sweets that you eat. Talk with your health care provider about whether it is safe for you to drink red wine in moderation. This means 1 glass a day for nonpregnant women and 2 glasses a day for men. A glass of wine equals 5 oz (150 mL). This information is not intended to replace advice given to you by your health care provider. Make sure you discuss any questions you have with your health care provider. Document Released: 07/14/2016 Document Revised: 08/16/2016 Document Reviewed: 07/14/2016 Elsevier Interactive Patient Education  2017 [employer].

## 2022-06-29 ENCOUNTER — Telehealth: Payer: Self-pay | Admitting: Pharmacist

## 2022-06-29 NOTE — Telephone Encounter (Signed)
Received message from pharmacy that Praluent is no longer covered on pt's insurance and only Repatha is now. Prior Josem Kaufmann has been submitted.

## 2022-06-30 MED ORDER — REPATHA SURECLICK 140 MG/ML ~~LOC~~ SOAJ: 1.0000 | SUBCUTANEOUS | 11 refills | Status: DC

## 2022-06-30 NOTE — Telephone Encounter (Signed)
Pt's wife returned call, relayed messages below.

## 2022-06-30 NOTE — Telephone Encounter (Signed)
Received message that Repatha is available without prior authorization. Left message for pt to discuss change in therapy. Will need new rx sent in to pharmacy. Pt will also need to call Repatha Ready to activate $5 copay card since he is over 65 at [phone].

## 2022-07-27 NOTE — Progress Notes (Unsigned)
Cardiology Office Note:    Date:  08/04/2022   ID:  [name], DOB 20-May-1946, MRN [id number]  PCP:  Ginger Organ., MD   [facility] Providers Cardiologist:  Jeris Easterly Martinique, MD {   Referring MD: Ginger Organ., MD   Chief Complaint  Patient presents with   Coronary Artery Disease    History of Present Illness:    [name] is a 76 y.o. male with a hx of CAD s/p CABG 12/2019 (LIMA to LAD, SVG to diagonal, SVG to OM1 and SVG to PDA), postop atrial fibrillation, hypertension, hyperlipidemia and DM2.  He had a STEMI in April 2021 that required stenting of OM 2 and distal left circumflex artery, SVG to OM and PDA was occluded, he did have 70% proximal RCA and a diffusely diseased PLA branch that was filled with left-to-right collaterals, this was managed medically.  Postprocedure, he was placed on aspirin and Plavix.  Anticoagulation therapy and amiodarone were discontinued as his A. fib only occurred after bypass surgery.  He was placed on Praluent in May 2021 for hyperlipidemia.  He had underwent another cardiac catheterization in March 2022, this demonstrated restenosis in the stent of the OM this was treated with a scoring balloon angioplasty, proximal RCA was also stented, LAD distal left circumflex artery stent was patent.  Unfortunately patient went back to the hospital on 03/30/2021 with recurrent chest pain.  Serial troponin was only borderline elevated.  His symptom was concerning for unstable angina.  Repeat cardiac catheterization performed on 03/31/2021 showed 80% mid LAD, 100% mid LAD occlusion, 99% OM2 lesion treated with DES x2, EF 55 to 65%, widely patent distal left circumflex stent.  It is recommended he continue dual antiplatelet therapy indefinitely. During this hospitalization, Imdur was increased to 60 mg daily.  Beta-blocker was reduced down to 25 mg twice a day due to bradycardia.   On follow up today he is doing very well. States he can't remember when  he last had chest pain. No dyspnea or palpitations.  He is active. Overall feels well. Notes he could not tolerate Ozempic in past but was represcribed at a lower dose. He hasn't started yet.    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   Urinary hesitancy     Past Surgical History:  Procedure Laterality Date   ANKLE ARTHROPLASTY  2006   CARDIOVASCULAR STRESS TEST  01/22/2009   EF 51%, NO ISCHEMIA   CARDIOVASCULAR STRESS TEST     COLONOSCOPY     CORONARY ANGIOPLASTY WITH STENT PLACEMENT  03/2005   CORONARY ARTERY BYPASS GRAFT N/A 12/20/2019   Procedure: CORONARY ARTERY BYPASS GRAFTING (CABG) times four, using left internal mammary artery and left greater saphenous vein;  Surgeon: Ivin Poot, MD;  Location: [place];  Service: Open  Heart Surgery;  Laterality: N/A;   CORONARY BALLOON ANGIOPLASTY N/A 02/09/2021   Procedure: CORONARY BALLOON ANGIOPLASTY;  Surgeon: Martinique, Hearl Heikes M, MD;  Location: [place] [facility];  Service: Cardiovascular;  Laterality: N/A;   CORONARY STENT INTERVENTION N/A 03/16/2020   Procedure: CORONARY STENT INTERVENTION;  Surgeon: Martinique, Jezelle Gullick M, MD;  Location: [place] [facility];  Service: Cardiovascular;  Laterality: N/A;   CORONARY STENT INTERVENTION N/A 02/09/2021   Procedure: CORONARY STENT INTERVENTION;  Surgeon: Martinique, Terez Freimark M, MD;  Location: [place] [facility];  Service: Cardiovascular;  Laterality: N/A;   CORONARY STENT INTERVENTION N/A 03/31/2021    Procedure: CORONARY STENT INTERVENTION;  Surgeon: Wellington Hampshire, MD;  Location: [place] [facility];  Service: Cardiovascular;  Laterality: N/A;   CORONARY STENT PLACEMENT  03/10/2005   SUCCESSFUL STENTING OF THE MID AND CRUX OF THE RCA   CORONARY/GRAFT ACUTE MI REVASCULARIZATION N/A 03/16/2020   Procedure: Coronary/Graft Acute MI Revascularization;  Surgeon: Martinique, Nancy Arvin M, MD;  Location: [place] [facility];  Service: Cardiovascular;  Laterality: N/A;   ESOPHAGEAL DILATION     multiple procedures   HERNIA REPAIR  [id number]   umbilical hernia   INGUINAL HERNIA REPAIR  11/28/2011   Procedure: HERNIA REPAIR INGUINAL ADULT;  Surgeon: Imogene Burn. Georgette Dover, MD;  Location: [place];  Service: General;  Laterality: Right;  right inguinal hernia repair with mesh   KNEE SURGERY     Right   LEFT HEART CATH AND CORONARY ANGIOGRAPHY N/A 12/03/2019   Procedure: LEFT HEART CATH AND CORONARY ANGIOGRAPHY;  Surgeon: Jettie Booze, MD;  Location: [place] [facility];  Service: Cardiovascular;  Laterality: N/A;   LEFT HEART CATH AND CORS/GRAFTS ANGIOGRAPHY N/A 03/16/2020   Procedure: LEFT HEART CATH AND CORS/GRAFTS ANGIOGRAPHY;  Surgeon: Martinique, Alichia Alridge M, MD;  Location: [place] [facility];  Service: Cardiovascular;  Laterality: N/A;   LEFT HEART CATH AND CORS/GRAFTS ANGIOGRAPHY N/A 02/09/2021   Procedure: LEFT HEART CATH AND CORS/GRAFTS ANGIOGRAPHY;  Surgeon: Martinique, Kostas Marrow M, MD;  Location: [place] [facility];  Service: Cardiovascular;  Laterality: N/A;   LEFT HEART CATH AND CORS/GRAFTS ANGIOGRAPHY N/A 03/31/2021   Procedure: LEFT HEART CATH AND CORS/GRAFTS ANGIOGRAPHY;  Surgeon: Wellington Hampshire, MD;  Location: [place] [facility];  Service: Cardiovascular;  Laterality: N/A;   POLYPECTOMY     TEE WITHOUT CARDIOVERSION N/A 12/20/2019   Procedure: TRANSESOPHAGEAL ECHOCARDIOGRAM (TEE);  Surgeon: Prescott Gum, Collier Salina, MD;  Location: [place];  Service: Open Heart Surgery;  Laterality: N/A;   TONSILLECTOMY  [id number]   UMBILICAL  HERNIA REPAIR  11/2006   US ECHOCARDIOGRAPHY      Current Medications: Current Meds  Medication Sig   acetaminophen (TYLENOL) 650 MG CR tablet Take 1,300 mg by mouth every 8 (eight) hours as needed for pain.   amLODipine (NORVASC) 5 MG tablet Take 1 tablet (5 mg total) by mouth daily.   aspirin EC 81 MG tablet Take 81 mg by mouth in the morning.   B Complex-C (B-COMPLEX WITH VITAMIN C) tablet Take 1 tablet by mouth daily with breakfast.    Calcium-Magnesium-Zinc (CAL-MAG-ZINC PO) Take 1 tablet by mouth daily with lunch.   cetirizine (ZYRTEC) 10 MG tablet Take 10 mg by mouth in the morning.   clopidogrel (PLAVIX) 75 MG tablet Take 1 tablet (75 mg total) by mouth daily with breakfast.   empagliflozin (JARDIANCE) 25 MG TABS tablet Take 25 mg by mouth in the morning.   Evolocumab (REPATHA SURECLICK) [id number] MG/ML  SOAJ Inject 1 Pen into the skin every 14 (fourteen) days.   finasteride (PROSCAR) 5 MG tablet Take 5 mg by mouth daily.   fluticasone (FLONASE) 50 MCG/ACT nasal spray Place 2 sprays into both nostrils daily as needed for allergies.   isosorbide mononitrate (IMDUR) 60 MG 24 hr tablet Take 1 tablet (60 mg total) by mouth daily.   memantine (NAMENDA) 5 MG tablet Take 1 tablet (5 mg at night) for 2 weeks, then increase to 1 tablet (5 mg) twice a day   metoprolol tartrate (LOPRESSOR) 25 MG tablet Take 1 tablet (25 mg total) by mouth 2 (two) times daily.   nitroGLYCERIN (NITROSTAT) 0.4 MG SL tablet Place 1 tablet (0.4 mg total) under the tongue every 5 (five) minutes as needed. (Patient taking differently: Place 0.4 mg under the tongue every 5 (five) minutes as needed for chest pain.)   OneTouch Delica Lancets [id number] MISC 1 each by Other route daily.   ONETOUCH VERIO test strip 1 each by Other route daily.   pantoprazole (PROTONIX) 40 MG tablet Take 1 tablet (40 mg total) by mouth 2 (two) times daily.   Tamsulosin HCl (FLOMAX) 0.4 MG CAPS Take 0.4 mg by mouth at bedtime.     Allergies:   Kiwi  extract, Statins, Bismuth subsalicylate, Codeine, Morphine and related, Tape, and Altace [ramipril]   Social History   Socioeconomic History   Marital status: Married    Spouse name: Not on file   Number of children: 3   Years of education: Not on file   Highest education level: Not on file  Occupational History   Occupation: Retired  Tobacco Use   Smoking status: Former    Packs/day: 1.00    Years: 22.00    Total pack years: 22.00    Types: Cigarettes, Pipe    Quit date: 12/17/2014    Years since quitting: 7.6   Smokeless tobacco: Never  Substance and Sexual Activity   Alcohol use: No    Alcohol/week: 0.0 standard drinks of alcohol   Drug use: No   Sexual activity: Not on file  Other Topics Concern   Not on file  Social History Narrative   Right handed   Drinks caffeine   One story home   Social Determinants of Health   Financial Resource Strain: Not on file  Food Insecurity: Not on file  Transportation Needs: Not on file  Physical Activity: Not on file  Stress: Not on file  Social Connections: Not on file     Family History: The patient's family history includes Cancer in his paternal grandmother; Heart disease in his brother, maternal grandfather, mother, and sister; Ovarian cancer in his paternal grandmother. There is no history of Colon cancer, Esophageal cancer, or Stomach cancer.  ROS:   Please see the history of present illness.     All other systems reviewed and are negative.  EKGs/Labs/Other Studies Reviewed:    The following studies were reviewed today:  Cath 03/31/2021 Mid LAD-1 lesion is 80% stenosed. Prox LAD lesion is 25% stenosed. Mid LAD-2 lesion is 100% stenosed. 2nd Mrg-2 lesion is 99% stenosed. RPAV lesion is 99% stenosed. Mid RCA lesion is 30% stenosed. Dist RCA lesion is 30% stenosed. Previously placed Prox RCA drug eluting stent is widely patent. The left ventricular systolic function is normal. LV end diastolic pressure is mildly  elevated. The left ventricular ejection fraction is 55-65% by visual estimate. Previously placed Dist Cx stent (unknown type) is widely patent. Mid Cx to  Dist Cx lesion is 30% stenosed. Post intervention, there is a 0% residual stenosis. A drug-eluting stent was successfully placed using a SYNERGY XD 2.50X12. 2nd Mrg-1 lesion is 90% stenosed. Post intervention, there is a 0% residual stenosis. A drug-eluting stent was successfully placed using a SYNERGY XD 2.75X24. Lat 2nd Mrg lesion is 50% stenosed.   1.  Significant underlying three-vessel coronary artery disease with patent LIMA to LAD and patent SVG to diagonal.  Chronically occluded SVG to OM 2 and SVG to right PDA.  Patent RCA stents with mild in-stent restenosis in the mid and distal stents.  Patent distal left circumflex stent with minimal restenosis.  Severe in-stent restenosis in the proximal as well as distal stent in a large OM2. 2.  Normal LV systolic function mildly elevated left ventricular end-diastolic pressure. 3.  Successful OCT guided PCI and drug-eluting stent placement to the distal and proximal OM2.  The distal stent did not cover the whole length of the previously placed stent as I was concerned about small vessel size distally and in order not to jail the inferior branch which was diseased.  I elected to restent with different platform given the quick restenosis after recent cath and balloon angioplasty.   Recommendations: Continue dual antiplatelet therapy indefinitely if possible. Aggressive treatment of risk factors. Hydrate overnight given underlying chronic kidney disease.   EKG:  EKG is  ordered today.  NSR rate 48. LAD, old inferior and anterior infarcts. No change from prior. I have personally reviewed and interpreted this study.   Recent Labs: No results found for requested labs within last 365 days.  Recent Lipid Panel    Component Value Date/Time   CHOL 109 03/31/2021 0240   CHOL 122 02/01/2021 1136    TRIG 143 03/31/2021 0240   HDL 46 03/31/2021 0240   HDL 53 02/01/2021 1136   CHOLHDL 2.4 03/31/2021 0240   VLDL 29 03/31/2021 0240   LDLCALC 34 03/31/2021 0240   LDLCALC 50 02/01/2021 1136   Dated 04/29/21: cholesterol 110, triglycerides 83, HDL 50, LDL 43.  Dated 09/09/21: A1c 8.3%/  Dated 05/17/22: cholesterol 134, triglycerides 98, HDL 59, LDL 55. Acc 9%. CMET normal   Risk Assessment/Calculations:    CHA2DS2-VASc Score =    This indicates a  % annual risk of stroke. The patient's score is based upon:        Physical Exam:    VS:  BP 138/62   Pulse (!) 48   Ht 5' 8"$  (1.727 m)   Wt 155 lb 3.2 oz (70.4 kg)   SpO2 98%   BMI 23.60 kg/m     Wt Readings from Last 3 Encounters:  08/04/22 155 lb 3.2 oz (70.4 kg)  06/13/22 154 lb (69.9 kg)  12/29/21 157 lb 6.4 oz (71.4 kg)     GEN:  Well nourished, well developed in no acute distress HEENT: Normal NECK: No JVD; No carotid bruits LYMPHATICS: No lymphadenopathy CARDIAC: RRR, no murmurs, rubs, gallops RESPIRATORY:  Clear to auscultation without rales, wheezing or rhonchi  ABDOMEN: Soft, non-tender, non-distended MUSCULOSKELETAL:  No edema; No deformity  SKIN: Warm and dry NEUROLOGIC:  Alert and oriented x 3 PSYCHIATRIC:  Normal affect   ASSESSMENT:    1. Coronary artery disease of bypass graft of native heart with stable angina pectoris ([place])   2. S/P CABG x 4   3. Hypercholesterolemia   4. Essential hypertension      PLAN:    In order of problems listed above:  CAD s/p CABG 12/2019 with subsequent PCI/DES to OM2 x2 and LCx x1 03/16/20 with cardiac cath showing occlusion of SVG to OM1 and SVG to PDA: patent LIMA to LAD and SVG to diagonal, s/p scoring balloon PCI of the OM2 for restenosis on 02/09/21. PCI of the proximal RCA with DES.  Then underwent DES x2 to distal and proximal OM 2 in late April 2022 for restenosis.   He has done well since then with infrequent angina.  Continue aspirin and Plavix indefinitely.   On Imdur, amlodipine and beta blocker. Stable class 1 angina.   Postop atrial fibrillation: Occurred after the previous bypass surgery.  No recurrence  Hypertension: Blood pressure is well controlled.  Hyperlipidemia: On Praluent - LDL at goal  DM2: per PCP  6.   Memory loss.   I will follow up in 6 months.   Signed, Dulcinea Kinser Martinique, MD  08/04/2022 11:16 AM    [place]

## 2022-08-04 ENCOUNTER — Encounter: Payer: Self-pay | Admitting: Cardiology

## 2022-08-04 ENCOUNTER — Ambulatory Visit: Payer: BC Managed Care – PPO | Attending: Cardiology | Admitting: Cardiology

## 2022-08-04 VITALS — BP 138/62 | HR 48 | Ht 68.0 in | Wt 155.2 lb

## 2022-08-04 DIAGNOSIS — I10 Essential (primary) hypertension: Secondary | ICD-10-CM | POA: Diagnosis not present

## 2022-08-04 DIAGNOSIS — I25708 Atherosclerosis of coronary artery bypass graft(s), unspecified, with other forms of angina pectoris: Secondary | ICD-10-CM

## 2022-08-04 DIAGNOSIS — Z951 Presence of aortocoronary bypass graft: Secondary | ICD-10-CM | POA: Diagnosis not present

## 2022-08-04 DIAGNOSIS — E7800 Pure hypercholesterolemia, unspecified: Secondary | ICD-10-CM | POA: Diagnosis not present

## 2022-08-04 NOTE — Patient Instructions (Signed)
Medication Instructions:  Your physician recommends that you continue on your current medications as directed. Please refer to the Current Medication list given to you today.  *If you need a refill on your cardiac medications before your next appointment, please call your pharmacy*   Lab Work: None   Testing/Procedures: None   Follow-Up: At [facility], you and your health needs are our priority.  As part of our continuing mission to provide you with exceptional heart care, we have created designated Provider Care Teams.  These Care Teams include your primary Cardiologist (physician) and Advanced Practice Providers (APPs -  Physician Assistants and Nurse Practitioners) who all work together to provide you with the care you need, when you need it.  We recommend signing up for the patient portal called "MyChart".  Sign up information is provided on this After Visit Summary.  MyChart is used to connect with patients for Virtual Visits (Telemedicine).  Patients are able to view lab/test results, encounter notes, upcoming appointments, etc.  Non-urgent messages can be sent to your provider as well.   To learn more about what you can do with MyChart, go to [email or web address].    Your next appointment:   6 month(s)  The format for your next appointment:   In Person  Provider:   Peter Martinique, MD     Other Instructions   Important Information About Sugar

## 2022-10-05 ENCOUNTER — Encounter: Payer: Self-pay | Admitting: Podiatry

## 2022-10-05 ENCOUNTER — Ambulatory Visit: Payer: BC Managed Care – PPO | Admitting: Podiatry

## 2022-10-05 DIAGNOSIS — L603 Nail dystrophy: Secondary | ICD-10-CM

## 2022-10-05 DIAGNOSIS — R350 Frequency of micturition: Secondary | ICD-10-CM | POA: Insufficient documentation

## 2022-10-05 DIAGNOSIS — R42 Dizziness and giddiness: Secondary | ICD-10-CM | POA: Insufficient documentation

## 2022-10-06 NOTE — Progress Notes (Signed)
Subjective:  Patient ID: [name], male    DOB: 12/27/1945,  MRN: [id number] HPI Chief Complaint  Patient presents with   Nail Problem    Hallux nail right - top half of toenail has turned white, noticed about 3-4 months ago tender sometimes when walking   Diabetes    Last a1c was 7.2   New Patient (Initial Visit)    Est pt 2017    75 y.o. male presents with the above complaint.   ROS: Denies fever chills nausea vomiting muscle aches pains calf pain back pain chest pain shortness of breath.  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   Urinary hesitancy    Past Surgical History:  Procedure Laterality Date   ANKLE ARTHROPLASTY  2006   CARDIOVASCULAR STRESS TEST  01/22/2009   EF 51%, NO ISCHEMIA   CARDIOVASCULAR STRESS TEST     COLONOSCOPY     CORONARY ANGIOPLASTY WITH STENT PLACEMENT  03/2005   CORONARY ARTERY BYPASS GRAFT N/A 12/20/2019   Procedure: CORONARY ARTERY BYPASS GRAFTING (CABG) times four, using left internal mammary artery and left greater saphenous vein;  Surgeon: Ivin Poot, MD;  Location: [facility];  Service: Open Heart Surgery;  Laterality: N/A;   CORONARY BALLOON ANGIOPLASTY N/A 02/09/2021   Procedure: CORONARY BALLOON ANGIOPLASTY;  Surgeon: Martinique, Peter M, MD;  Location: [facility];  Service: Cardiovascular;  Laterality: N/A;   CORONARY STENT INTERVENTION N/A 03/16/2020   Procedure: CORONARY STENT INTERVENTION;  Surgeon: Martinique, Peter  M, MD;  Location: [facility];  Service: Cardiovascular;  Laterality: N/A;   CORONARY STENT INTERVENTION N/A 02/09/2021   Procedure: CORONARY STENT INTERVENTION;  Surgeon: Martinique, Peter M, MD;  Location: [facility];  Service: Cardiovascular;  Laterality: N/A;   CORONARY STENT INTERVENTION N/A 03/31/2021   Procedure: CORONARY STENT INTERVENTION;  Surgeon: Wellington [place], MD;  Location: [facility];  Service: Cardiovascular;  Laterality: N/A;   CORONARY STENT PLACEMENT  03/10/2005   SUCCESSFUL STENTING OF THE MID AND CRUX OF THE RCA   CORONARY/GRAFT ACUTE MI REVASCULARIZATION N/A 03/16/2020   Procedure: Coronary/Graft Acute MI Revascularization;  Surgeon: Martinique, Peter M, MD;  Location: [facility];  Service: Cardiovascular;  Laterality: N/A;   ESOPHAGEAL DILATION     multiple procedures   HERNIA REPAIR  70/96/2836   umbilical hernia   INGUINAL HERNIA REPAIR  11/28/2011   Procedure: HERNIA REPAIR INGUINAL ADULT;  Surgeon: Imogene Burn. Georgette Dover, MD;  Location: [facility];  Service: General;  Laterality: Right;  right inguinal hernia repair with mesh   KNEE SURGERY     Right   LEFT HEART CATH AND CORONARY ANGIOGRAPHY N/A 12/03/2019   Procedure: LEFT HEART CATH AND CORONARY ANGIOGRAPHY;  Surgeon: Jettie Booze, MD;  Location: [facility];  Service: Cardiovascular;  Laterality: N/A;   LEFT HEART CATH AND CORS/GRAFTS ANGIOGRAPHY N/A 03/16/2020   Procedure: LEFT HEART CATH AND CORS/GRAFTS ANGIOGRAPHY;  Surgeon: Martinique, Peter M, MD;  Location: [facility];  Service: Cardiovascular;  Laterality: N/A;   LEFT HEART CATH AND CORS/GRAFTS ANGIOGRAPHY N/A 02/09/2021   Procedure: LEFT HEART CATH AND CORS/GRAFTS ANGIOGRAPHY;  Surgeon: Martinique, Peter M, MD;  Location: [facility];  Service: Cardiovascular;  Laterality: N/A;   LEFT HEART CATH AND CORS/GRAFTS ANGIOGRAPHY N/A 03/31/2021   Procedure: LEFT HEART CATH AND CORS/GRAFTS ANGIOGRAPHY;  Surgeon: Wellington [place], MD;   Location: [facility];  Service: Cardiovascular;  Laterality: N/A;   POLYPECTOMY     TEE WITHOUT CARDIOVERSION N/A 12/20/2019   Procedure: TRANSESOPHAGEAL ECHOCARDIOGRAM (TEE);  Surgeon: Prescott Gum, Collier Salina, MD;  Location: [facility];  Service: Open Heart Surgery;  Laterality: N/A;   TONSILLECTOMY  9326   UMBILICAL HERNIA REPAIR  11/2006   US ECHOCARDIOGRAPHY      Current Outpatient Medications:    acetaminophen (TYLENOL) 650 MG CR tablet, Take 1,300 mg by mouth every 8 (eight) hours as needed for pain., Disp: , Rfl:    amLODipine (NORVASC) 5 MG tablet, Take 1 tablet (5 mg total) by mouth daily., Disp: 90 tablet, Rfl: 3   aspirin EC 81 MG tablet, Take 81 mg by mouth in the morning., Disp:  , Rfl:    B Complex-C (B-COMPLEX WITH VITAMIN C) tablet, Take 1 tablet by mouth daily with breakfast. , Disp: , Rfl:    Calcium-Magnesium-Zinc (CAL-MAG-ZINC PO), Take 1 tablet by mouth daily with lunch., Disp: , Rfl:    cetirizine (ZYRTEC) 10 MG tablet, Take 10 mg by mouth in the morning., Disp: , Rfl:    clopidogrel (PLAVIX) 75 MG tablet, Take 1 tablet (75 mg total) by mouth daily with breakfast., Disp: 90 tablet, Rfl: 3   empagliflozin (JARDIANCE) 25 MG TABS tablet, Take 25 mg by mouth in the morning., Disp: 30 tablet, Rfl:    Evolocumab (REPATHA SURECLICK) 712 MG/ML SOAJ, Inject 1 Pen into the skin every 14 (fourteen) days., Disp: 2 mL, Rfl: 11   finasteride (PROSCAR) 5 MG tablet, Take 5 mg by mouth daily., Disp: , Rfl:    fluticasone (FLONASE) 50 MCG/ACT nasal spray, Place 2 sprays into both nostrils daily as needed for allergies., Disp: , Rfl:    isosorbide mononitrate (IMDUR) 60 MG 24 hr tablet, Take 1 tablet (60 mg total) by mouth daily., Disp: 90 tablet, Rfl: 3   memantine (NAMENDA) 5 MG tablet, Take 1 tablet (5 mg at night) for 2 weeks, then increase to 1 tablet (5 mg) twice a day, Disp: 60 tablet, Rfl: 11   metoprolol tartrate (LOPRESSOR) 25 MG tablet, Take 1 tablet (25 mg total) by mouth 2 (two)  times daily., Disp: 180 tablet, Rfl: 3   nitroGLYCERIN (NITROSTAT) 0.4 MG SL tablet, Place 1 tablet (0.4 mg total) under the tongue every 5 (five) minutes as needed. (Patient taking differently: Place 0.4 mg under the tongue every 5 (five) minutes as needed for chest pain.), Disp: 25 tablet, Rfl: 11   OneTouch Delica Lancets 45Y MISC, 1 each by Other route daily., Disp: , Rfl:    ONETOUCH VERIO test strip, 1 each by Other route daily., Disp: , Rfl:    OZEMPIC, 0.25 OR 0.5 MG/DOSE, 2 MG/3ML SOPN, Inject into the skin., Disp: , Rfl:    pantoprazole (PROTONIX) 40 MG tablet, Take 1 tablet (  40 mg total) by mouth 2 (two) times daily., Disp: 180 tablet, Rfl: 3   Tamsulosin HCl (FLOMAX) 0.4 MG CAPS, Take 0.4 mg by mouth at bedtime., Disp: , Rfl:   Allergies  Allergen Reactions   Kiwi Extract Anaphylaxis and Other (See Comments)    Makes face red, breaks out into bumps on body, also   Statins Anaphylaxis and Other (See Comments)    Myalgias, also    Bismuth Subsalicylate Nausea And Vomiting   Codeine Nausea And Vomiting   Morphine And Related Nausea And Vomiting   Tape Other (See Comments)    "Tape pulls off the skin"- Coban and paper tape are tolerated   Altace [Ramipril] Rash and Other (See Comments)    All-over body rash   Review of Systems Objective:  There were no vitals filed for this visit.  General: Well developed, nourished, in no acute distress, alert and oriented x3   Dermatological: Skin is warm, dry and supple bilateral. Nails x 10 are well maintained; remaining integument appears unremarkable at this time. There are no open sores, no preulcerative lesions, no rash or signs of infection present.  Hallux nail right does demonstrate a distal subungual white onychomycosis.  Doubtful that this is a nail dystrophy.  Appears to be contained primarily to this 1 nail plate.  Vascular: Dorsalis Pedis artery and Posterior Tibial artery pedal pulses are 2/4 bilateral with immedate capillary  fill time. Pedal hair growth present. No varicosities and no lower extremity edema present bilateral.   Neruologic: Grossly intact via light touch bilateral. Vibratory intact via tuning fork bilateral. Protective threshold with Semmes Wienstein monofilament intact to all pedal sites bilateral. Patellar and Achilles deep tendon reflexes 2+ bilateral. No Babinski or clonus noted bilateral.   Musculoskeletal: No gross boney pedal deformities bilateral. No pain, crepitus, or limitation noted with foot and ankle range of motion bilateral. Muscular strength 5/5 in all groups tested bilateral.  Gait: Unassisted, Nonantalgic.    Radiographs:  None taken  Assessment & Plan:   Assessment: Nail dystrophy cannot rule out subungual white onychomycosis.  Plan: Samples of skin and nail were taken today for pathologic evaluation we will follow-up with him in 6 weeks     Mande Auvil T. [place], [place]

## 2022-10-20 ENCOUNTER — Encounter: Payer: Self-pay | Admitting: Podiatry

## 2022-10-24 ENCOUNTER — Other Ambulatory Visit: Payer: Self-pay | Admitting: Cardiology

## 2022-11-21 ENCOUNTER — Encounter: Payer: Self-pay | Admitting: Podiatry

## 2022-11-21 ENCOUNTER — Ambulatory Visit (INDEPENDENT_AMBULATORY_CARE_PROVIDER_SITE_OTHER): Payer: BC Managed Care – PPO | Admitting: Podiatry

## 2022-11-21 DIAGNOSIS — L603 Nail dystrophy: Secondary | ICD-10-CM | POA: Diagnosis not present

## 2022-11-21 NOTE — Progress Notes (Signed)
He presents today with his daughter for follow-up of his nail samples.  Objective: Pathology results demonstrated mild yeast and nail dystrophy.  Assessment: Onychomycosis with nail dystrophy.  Plan: We discussed topical laser and oral options.  At this point he would like to do nothing.  I expressed to him that we could keep his nails cut short and filed thin for him he may take a step on that we will follow-up with him on an as-needed basis.

## 2022-12-14 ENCOUNTER — Ambulatory Visit: Payer: BC Managed Care – PPO | Admitting: Physician Assistant

## 2022-12-14 VITALS — BP 134/70 | HR 65 | Ht 68.0 in | Wt 159.2 lb

## 2022-12-14 DIAGNOSIS — G3184 Mild cognitive impairment, so stated: Secondary | ICD-10-CM | POA: Diagnosis not present

## 2022-12-14 NOTE — Progress Notes (Signed)
Assessment/Plan:   Mild cognitive impairment likely due to Alzheimer's disease  [name] is 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 Patient is currently on memantine 5 mg twice daily, tolerating well. Prior CT head personally reviewed was remarkable for significant amount of central and mesial temporal volume loss, mild chronic ischemic white matter disease.  MMSE today is 24/30, stable from prior.  He reports that he is planning to increase his level of activity in the near future for social stimulation.   Recommendations   Follow up in  6 months after neurocognitive testing. Continue memantine 5 mg twice daily, side effects discussed.   Repeat neurocognitive testing for clarity of the diagnosis and disease trajectory is scheduled for July 2024 Monitor closely cardiovascular risk factors, follow-up with cardiology Continue to monitor mood as per PCP. Agree with increasing daily activities for social stimulation    Subjective:    This patient is accompanied in the office by his wife who supplements the history.  Previous records as well as any outside records available were reviewed prior to todays visit. Patient was last seen on 06/13/2022 at which time his MMSE was 22/30.   Any changes in memory since last visit?  His wife reports that his memory may be worse, having some difficulty remembering recent conversations and people's names that he meet.  However, he is able to remember people that he knows, overall his long-term memory is not worse.  He enjoys crossword puzzles and word finding.  His wife reports that he is not very active, does not like to participate much in activities outside of the house except going to charge.  She states that he is going to start  being more active there for social stimulation.    repeats oneself?  Endorsed, especially with questions about appointments "when is the appointment?  " Disoriented when walking into a room?  Slightly more confused at night, he may not recognize the house then. Leaving objects in unusual places?  He leaves stuff around the house but "not very often"-but not in unusual places  Wandering behavior?  denies   Any personality changes since last visit?  denies   Any worsening depression?:  denies, although he may get frustrated at times when he cannot remember Hallucinations or paranoia?  denies hallucinations, but at times when he loses the wallet and accuses someone of taking it. Seizures?    denies    Any sleep changes? "Some nights he sleeps well, some of the nights he walks all night and then he gets sleepy, "but he has ever been a sleeper though"-his wife says.  If needed, he takes prn Benadryl but this is very seldom.  "Always talked in his sleep, and had vivid dreams ", denies REM behavior or sleepwalking   Sleep apnea? "I don't know"  Any hygiene concerns?    denies   Independent of bathing and dressing?  Endorsed  Does the patient needs help with medications?  Wife places them in a container and he takes them Who is in charge of the finances?  Wife is in charge "but he can still paychecks "    Any changes in appetite?  denies    Patient have trouble swallowing? Occasionally, but has GERD "that is why he takes  the medicine" Does the patient cook? No    Any kitchen accidents such as leaving the stove on? Patient denies   Any headaches?   denies   Chronic back pain  denies   Ambulates with difficulty?   denies   Recent falls or head injuries? denies     Unilateral weakness, numbness or tingling?  denies   Any tremors?  denies   Any anosmia?  Patient denies   Any incontinence of urine?  He has a history of BPH, he has frequent urination with residual Any bowel dysfunction?     denies       Patient lives  with wife and daughter  Does the patient drive? Only short distances, only with his wife denies getting lost  Initial Consult 05/21/21 The patient is seen in neurologic consultation at the request of Martinique, Peter M, MD for the evaluation of memory.  The patient is accompanied by wife who supplements the history.  He is a 77 year old male who had memory issues for about 2 years, although he states that after his first MI and CABG in 2021 he began to notice "little things, such as losing more frequently that he is, or coming up with the name of an object or another name".  After his second MII, his symptoms appeared to worsen, with significant difficulty in recalling, although "stress makes it worse ".  His wife reports that when they are due to being alone, these memory issues are less evident, "he could almost make it to 100% ".  However, while they are surrounded by a crowd, he becomes easily distracted, and he is unable to recall, especially if some new information would have been presented a few minutes earlier, and trying to remember them 5 minutes later.  He reports that he may have undiagnosed ADHD.  He states that when growing up, the teachers would always reprimanded him for not paying attention, dropping out of  High School,  because he could not concentrate.  However, at home he could read the [employer], "tons of magazines and books ", and also learning to do a lot of manual activities.   He held numerous "hands-on jobs, including being a Engineer, drilling, Games developer, Dealer, working in the [employer], being a Actuary ".  The wife says that he would become bored with one activity, and try something new.  Currently, he likes to do yard work, cutting grass "2 acres of it ".  He also likes to play [employer], Chief Strategy Officer, board games.   His mood is overall good, without depression or irritability.  He states that he did very well during the pandemic, was not feeling  lonely.  He sleeps about 6 hours at night, and always had vivid dreams, no changes in the pattern.  He denies sleepwalking. He is able to do all his activities of daily living, such as bathing, dressing up, taking his medications without missing doses, or driving without getting lost.  He drives with a GPS but not afraid of getting lost, driving long distances without any issues.  He does his own finances without missing any payments.  "In fact, I had to remind my wife to pay on  time ".  He does not cook.  He used to have headaches as a youngster, but since moving from [place] to [place], the last 34 years he has not had any migraine headaches.   He denies any trauma to the head, double vision, dizziness, focal numbness or  tingling, unilateral weakness or tremors.  He has a history of BPH, reporting frequent urination with residual, and has to be seen by urology soon.  He has an appointment with them.  He denies any constipation or diarrhea.  Denies any history of sleep apnea, alcohol, he has remote tobacco history, quitting a 22 pack year in 2016.  Family history is negative for dementia. 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     CT head wo contrast 12/18/19  Mild chronic ischemic white matter disease. No acute intracranial abnormality seen.     Neuropsychological Evaluation, Dr. Nicole Kindred 09/13/21 "Mr. Delancey is thus demonstrating likely memory storage problems and other findings suggestive of temporal lobe dysfunction as can often be observed in age-related progressive causes of cognitive decline. The fact that he has experienced altered mental status after each of his operations is also concerning given that having an underlying condition is a risk factor for delirium (and delirium in turn is a risk factor for dementia). He should be followed over time.  [name] was seen for a psychiatric diagnostic evaluation and neuropsychological testing. He is a 77 year old, right-hand  dominant man with a significant amount of central and mesial temporal volume loss on neuroimaging. His evans ratio is abnormal but he has no signs and symptoms of hydrocephalus and I do think this may be ex vacuo (it was not noted by radiology). 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 and preliminary review of test data suggest that neuropsychological findings are likely to demonstrate a similar pattern"  PREVIOUS MEDICATIONS:   CURRENT MEDICATIONS:  Outpatient Encounter Medications as of 12/14/2022  Medication Sig   acetaminophen (TYLENOL) 650 MG CR tablet Take 1,300 mg by mouth every 8 (eight) hours as needed for pain.   amLODipine (NORVASC) 5 MG tablet Take 1 tablet (5 mg total) by mouth daily.   aspirin EC 81 MG tablet Take 81 mg by mouth in the morning.   B Complex-C (B-COMPLEX WITH VITAMIN C) tablet Take 1 tablet by mouth daily with breakfast.    Calcium-Magnesium-Zinc (CAL-MAG-ZINC PO) Take 1 tablet by mouth daily with lunch.   cetirizine (ZYRTEC) 10 MG tablet Take 10 mg by mouth in the morning.   clopidogrel (PLAVIX) 75 MG tablet Take 1 tablet (75 mg total) by mouth daily with breakfast.   empagliflozin (JARDIANCE) 25 MG TABS tablet Take 25 mg by mouth in the morning.   Evolocumab (REPATHA SURECLICK) 161 MG/ML SOAJ Inject 1 Pen into the skin every 14 (fourteen) days.   finasteride (PROSCAR) 5 MG tablet Take 5 mg by mouth daily.   fluticasone (FLONASE) 50 MCG/ACT nasal spray Place 2 sprays into both nostrils daily as needed for allergies.   isosorbide mononitrate (IMDUR) 60 MG 24 hr tablet Take 1 tablet (60 mg total) by mouth daily.   memantine (NAMENDA) 5 MG tablet Take 1 tablet (5 mg at night) for 2 weeks, then increase to 1 tablet (5 mg) twice a day   metoprolol tartrate (LOPRESSOR) 25 MG tablet TAKE 1 TABLET BY MOUTH 2 TIMES DAILY   nitroGLYCERIN (NITROSTAT) 0.4 MG SL tablet Place 1 tablet (0.4 mg total) under the tongue every 5 (five) minutes as  needed. (Patient taking differently: Place 0.4 mg under the tongue every 5 (five) minutes as needed for chest pain.)   OneTouch Delica Lancets 09U MISC 1 each by Other route daily.   ONETOUCH VERIO test strip 1 each by Other route  daily.   OZEMPIC, 0.25 OR 0.5 MG/DOSE, 2 MG/3ML SOPN Inject into the skin.   pantoprazole (PROTONIX) 40 MG tablet Take 1 tablet (40 mg total) by mouth 2 (two) times daily.   Tamsulosin HCl (FLOMAX) 0.4 MG CAPS Take 0.4 mg by mouth at bedtime.   No facility-administered encounter medications on file as of 12/14/2022.       12/14/2022    1:00 PM 06/13/2022    2:00 PM  MMSE - Mini Mental State Exam  Orientation to time 5 0  Orientation to Place 3 5  Registration 3 3  Attention/ Calculation 4 5  Recall 0 0  Language- name 2 objects 2 2  Language- repeat 1 1  Language- follow 3 step command 3 3  Language- read & follow direction 1 1  Write a sentence 1 1  Copy design 1 1  Total score 24 22      05/21/2021    2:00 PM  Montreal Cognitive Assessment   Visuospatial/ Executive (0/5) 4  Naming (0/3) 2  Attention: Read list of digits (0/2) 2  Attention: Read list of letters (0/1) 0  Attention: Serial 7 subtraction starting at 100 (0/3) 3  Language: Repeat phrase (0/2) 2  Language : Fluency (0/1) 0  Abstraction (0/2) 1  Delayed Recall (0/5) 0  Orientation (0/6) 5  Total 19  Adjusted Score (based on education) 20    Objective:     PHYSICAL EXAMINATION:    VITALS:   Vitals:   12/14/22 1300  BP: 134/70  Pulse: 65  SpO2: 98%  Weight: 159 lb 3.2 oz (72.2 kg)  Height: '5\' 8"'$  (1.727 [name])    GEN:  The patient appears stated age and is in NAD. HEENT:  Normocephalic, atraumatic.   Neurological examination:  General: NAD, well-groomed, appears stated age. Orientation: The patient is alert. Oriented to person, place and date Cranial nerves: There is good facial symmetry.The speech is fluent and clear. No aphasia or dysarthria. Fund of knowledge is  appropriate. Recent and remote memory are impaired. Attention and concentration are reduced.  Able to name objects and repeat phrases.  Hearing is intact to conversational tone.    Sensation: Sensation is intact to light touch throughout Motor: Strength is at least antigravity x4. DTR's 2/4 in UE/LE     Movement examination: Tone: There is normal tone in the UE/LE Abnormal movements:  no tremor.  No myoclonus.  No asterixis.   Coordination:  There is no decremation with RAM's. Normal finger to nose  Gait and Station: The patient has no difficulty arising out of a deep-seated chair without the use of the hands. The patient's stride length is good.  Gait is cautious and narrow.    Thank you for allowing [place] the opportunity to participate in the care of this nice patient. Please do not hesitate to contact us for any questions or concerns.   Total time spent on today's visit was 30 minutes dedicated to this patient today, preparing to see patient, examining the patient, ordering tests and/or medications and counseling the patient, documenting clinical information in the EHR or other health record, independently interpreting results and communicating results to the patient/family, discussing treatment and goals, answering patient's questions and coordinating care.  Cc:  Ginger Organ., MD  Sharene Butters 12/14/2022 1:34 PM

## 2022-12-14 NOTE — Patient Instructions (Addendum)
It was a pleasure to see you today at our office.   Recommendations:  Follow up in 6  months Continue Memantine 5 mg twice daily.Side effects were discussed  Feel free to visit Facebook page " [email or web address]" for tips of how to care for people with memory problems.   Whom to call:  Memory  decline, memory medications: Call our office [phone]   For psychiatric meds, mood meds: Please have your primary care physician manage these medications.     For assessment of decision of mental capacity and competency:  Call Dr. Anthoney Harada, geriatric psychiatrist at [phone]  For guidance in geriatric dementia issues please call [facility] [phone]  For guidance regarding [facility] and if placement were needed at the facility, contact Arnell Asal, Social Worker tel: [phone]  If you have any severe symptoms of a stroke, or other severe issues such as confusion,severe chills or fever, etc call 911 or go to the ER as you may need to be evaluated furth     RECOMMENDATIONS FOR ALL PATIENTS WITH MEMORY PROBLEMS: 1. Continue to exercise (Recommend 30 minutes of walking everyday, or 3 hours every week) 2. Increase social interactions - continue going to [place] and enjoy social gatherings with friends and family 3. Eat healthy, avoid fried foods and eat more fruits and vegetables 4. Maintain adequate blood pressure, blood sugar, and blood cholesterol level. Reducing the risk of stroke and cardiovascular disease also helps promoting better memory. 5. Avoid stressful situations. Live a simple life and avoid aggravations. Organize your time and prepare for the next day in anticipation. 6. Sleep well, avoid any interruptions of sleep and avoid any distractions in the bedroom that may interfere with adequate sleep quality 7. Avoid sugar, avoid sweets as there is a strong link between excessive sugar intake, diabetes, and cognitive impairment We discussed the  Mediterranean diet, which has been shown to help patients reduce the risk of progressive memory disorders and reduces cardiovascular risk. This includes eating fish, eat fruits and green leafy vegetables, nuts like almonds and hazelnuts, walnuts, and also use olive oil. Avoid fast foods and fried foods as much as possible. Avoid sweets and sugar as sugar use has been linked to worsening of memory function.  There is always a concern of gradual progression of memory problems. If this is the case, then we may need to adjust level of care according to patient needs. Support, both to the patient and caregiver, should then be put into place.    The [employer] is here all day, every day for people facing Alzheimer's disease through our free 24/7 Helpline: [phone]. The Helpline provides reliable information and support to all those who need assistance, such as individuals living with memory loss, Alzheimer's or other dementia, caregivers, health care professionals and the public.  Our highly trained and knowledgeable staff can help you with: Understanding memory loss, dementia and Alzheimer's  Medications and other treatment options  General information about aging and brain health  Skills to provide quality care and to find the best care from professionals  Legal, financial and living-arrangement decisions Our Helpline also features: Confidential care consultation provided by master's level clinicians who can help with decision-making support, crisis assistance and education on issues families face every day  Help in a caller's preferred language using our translation service that features more than 200 languages and dialects  Referrals to local community programs, services and ongoing support     FALL PRECAUTIONS: Be  cautious when walking. Scan the area for obstacles that may increase the risk of trips and falls. When getting up in the mornings, sit up at the edge of the bed for a  few minutes before getting out of bed. Consider elevating the bed at the head end to avoid drop of blood pressure when getting up. Walk always in a well-lit room (use night lights in the walls). Avoid area rugs or power cords from appliances in the middle of the walkways. Use a walker or a cane if necessary and consider physical therapy for balance exercise. Get your eyesight checked regularly.  FINANCIAL OVERSIGHT: Supervision, especially oversight when making financial decisions or transactions is also recommended.  HOME SAFETY: Consider the safety of the kitchen when operating appliances like stoves, microwave oven, and blender. Consider having supervision and share cooking responsibilities until no longer able to participate in those. Accidents with firearms and other hazards in the house should be identified and addressed as well.   ABILITY TO BE LEFT ALONE: If patient is unable to contact 911 operator, consider using LifeLine, or when the need is there, arrange for someone to stay with patients. Smoking is a fire hazard, consider supervision or cessation. Risk of wandering should be assessed by caregiver and if detected at any point, supervision and safe proof recommendations should be instituted.  MEDICATION SUPERVISION: Inability to self-administer medication needs to be constantly addressed. Implement a mechanism to ensure safe administration of the medications.   DRIVING: Regarding driving, in patients with progressive memory problems, driving will be impaired. We advise to have someone else do the driving if trouble finding directions or if minor accidents are reported. Independent driving assessment is available to determine safety of driving.   If you are interested in the driving assessment, you can contact the following:  [employer] in [place]  [place]  [phone] or [phone]     [place] refers to food and lifestyle choices that are based on the traditions of countries located on the [employer]. This way of eating has been shown to help prevent certain conditions and improve outcomes for people who have chronic diseases, like kidney disease and heart disease. What are tips for following this plan? Lifestyle  Cook and eat meals together with your family, when possible. Drink enough fluid to keep your urine clear or pale yellow. Be physically active every day. This includes: Aerobic exercise like running or swimming. Leisure activities like gardening, walking, or housework. Get 7-8 hours of sleep each night. If recommended by your health care provider, drink red wine in moderation. This means 1 glass a day for nonpregnant women and 2 glasses a day for men. A glass of wine equals 5 oz (150 mL). Reading food labels  Check the serving size of packaged foods. For foods such as rice and pasta, the serving size refers to the amount of cooked product, not dry. Check the total fat in packaged foods. Avoid foods that have saturated fat or trans fats. Check the ingredients list for added sugars, such as corn syrup. Shopping  At the grocery store, buy most of your food from the areas near the walls of the store. This includes: Fresh fruits and vegetables (produce). Grains, beans, nuts, and seeds. Some of these may be available in unpackaged forms or large amounts (in bulk). Fresh seafood. Poultry and eggs. Low-fat dairy products. Buy whole ingredients instead  of prepackaged foods. Buy fresh fruits and vegetables in-season from local farmers markets. Buy frozen fruits and vegetables in resealable bags. If you do not have access to quality fresh seafood, buy precooked frozen shrimp or canned fish, such as tuna, salmon, or sardines. Buy small amounts of raw or cooked vegetables, salads, or olives from the  deli or salad bar at your store. Stock your pantry so you always have certain foods on hand, such as olive oil, canned tuna, canned tomatoes, rice, pasta, and beans. Cooking  Cook foods with extra-virgin olive oil instead of using butter or other vegetable oils. Have meat as a side dish, and have vegetables or grains as your main dish. This means having meat in small portions or adding small amounts of meat to foods like pasta or stew. Use beans or vegetables instead of meat in common dishes like chili or lasagna. Experiment with different cooking methods. Try roasting or broiling vegetables instead of steaming or sauteing them. Add frozen vegetables to soups, stews, pasta, or rice. Add nuts or seeds for added healthy fat at each meal. You can add these to yogurt, salads, or vegetable dishes. Marinate fish or vegetables using olive oil, lemon juice, garlic, and fresh herbs. Meal planning  Plan to eat 1 vegetarian meal one day each week. Try to work up to 2 vegetarian meals, if possible. Eat seafood 2 or more times a week. Have healthy snacks readily available, such as: Vegetable sticks with hummus. Greek yogurt. Fruit and nut trail mix. Eat balanced meals throughout the week. This includes: Fruit: 2-3 servings a day Vegetables: 4-5 servings a day Low-fat dairy: 2 servings a day Fish, poultry, or lean meat: 1 serving a day Beans and legumes: 2 or more servings a week Nuts and seeds: 1-2 servings a day Whole grains: 6-8 servings a day Extra-virgin olive oil: 3-4 servings a day Limit red meat and sweets to only a few servings a month What are my food choices? Mediterranean diet Recommended Grains: Whole-grain pasta. Brown rice. Bulgar wheat. Polenta. Couscous. Whole-wheat bread. [place]. Vegetables: Artichokes. Beets. Broccoli. Cabbage. Carrots. Eggplant. Green beans. Chard. Kale. Spinach. Onions. Leeks. Peas. Squash. Tomatoes. Peppers. Radishes. Fruits: Apples. Apricots.  Avocado. Berries. Bananas. Cherries. Dates. Figs. Grapes. Lemons. Melon. Oranges. Peaches. Plums. Pomegranate. Meats and other protein foods: Beans. Almonds. Sunflower seeds. Pine nuts. Peanuts. [place]. Salmon. Scallops. Shrimp. Tower. Tilapia. Clams. Oysters. Eggs. Dairy: Low-fat milk. Cheese. Greek yogurt. Beverages: Water. Red wine. Herbal tea. Fats and oils: Extra virgin olive oil. Avocado oil. Grape seed oil. Sweets and desserts: [place] yogurt with honey. Baked apples. Poached pears. Trail mix. Seasoning and other foods: Basil. Cilantro. Coriander. Cumin. Mint. Parsley. Sage. Rosemary. Tarragon. Garlic. Oregano. Thyme. Pepper. Balsalmic vinegar. Tahini. Hummus. Tomato sauce. Olives. Mushrooms. Limit these Grains: Prepackaged pasta or rice dishes. Prepackaged cereal with added sugar. Vegetables: Deep fried potatoes (french fries). Fruits: Fruit canned in syrup. Meats and other protein foods: Beef. Pork. Lamb. Poultry with skin. Hot dogs. [place] Salines. Dairy: Ice cream. Sour cream. Whole milk. Beverages: Juice. Sugar-sweetened soft drinks. Beer. Liquor and spirits. Fats and oils: Butter. Canola oil. Vegetable oil. Beef fat (tallow). Lard. Sweets and desserts: Cookies. Cakes. Pies. Candy. Seasoning and other foods: Mayonnaise. Premade sauces and marinades. The items listed may not be a complete list. Talk with your dietitian about what dietary choices are right for you. Summary The Mediterranean diet includes both food and lifestyle choices. Eat a variety of fresh fruits and vegetables, beans, nuts, seeds, and whole  grains. Limit the amount of red meat and sweets that you eat. Talk with your health care provider about whether it is safe for you to drink red wine in moderation. This means 1 glass a day for nonpregnant women and 2 glasses a day for men. A glass of wine equals 5 oz (150 mL). This information is not intended to replace advice given to you by your health care provider. Make sure you discuss  any questions you have with your health care provider. Document Released: 07/14/2016 Document Revised: 08/16/2016 Document Reviewed: 07/14/2016 Elsevier Interactive Patient Education  2017 [employer].

## 2022-12-25 ENCOUNTER — Other Ambulatory Visit: Payer: Self-pay | Admitting: Cardiology

## 2023-01-24 NOTE — Progress Notes (Deleted)
Cardiology Office Note:    Date:  08/04/2022   ID:  [name], DOB 20-May-1946, MRN [id number]  PCP:  Ginger Organ., MD   [facility] Providers Cardiologist:  Gates Jividen Martinique, MD {   Referring MD: Ginger Organ., MD   Chief Complaint  Patient presents with   Coronary Artery Disease    History of Present Illness:    [name] is a 77 y.o. male with a hx of CAD s/p CABG 12/2019 (LIMA to LAD, SVG to diagonal, SVG to OM1 and SVG to PDA), postop atrial fibrillation, hypertension, hyperlipidemia and DM2.  He had a STEMI in April 2021 that required stenting of OM 2 and distal left circumflex artery, SVG to OM and PDA was occluded, he did have 70% proximal RCA and a diffusely diseased PLA branch that was filled with left-to-right collaterals, this was managed medically.  Postprocedure, he was placed on aspirin and Plavix.  Anticoagulation therapy and amiodarone were discontinued as his A. fib only occurred after bypass surgery.  He was placed on Praluent in May 2021 for hyperlipidemia.  He had underwent another cardiac catheterization in March 2022, this demonstrated restenosis in the stent of the OM this was treated with a scoring balloon angioplasty, proximal RCA was also stented, LAD distal left circumflex artery stent was patent.  Unfortunately patient went back to the hospital on 03/30/2021 with recurrent chest pain.  Serial troponin was only borderline elevated.  His symptom was concerning for unstable angina.  Repeat cardiac catheterization performed on 03/31/2021 showed 80% mid LAD, 100% mid LAD occlusion, 99% OM2 lesion treated with DES x2, EF 55 to 65%, widely patent distal left circumflex stent.  It is recommended he continue dual antiplatelet therapy indefinitely. During this hospitalization, Imdur was increased to 60 mg daily.  Beta-blocker was reduced down to 25 mg twice a day due to bradycardia.   On follow up today he is doing very well. States he can't remember when  he last had chest pain. No dyspnea or palpitations.  He is active. Overall feels well. Notes he could not tolerate Ozempic in past but was represcribed at a lower dose. He hasn't started yet.    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   Urinary hesitancy     Past Surgical History:  Procedure Laterality Date   ANKLE ARTHROPLASTY  2006   CARDIOVASCULAR STRESS TEST  01/22/2009   EF 51%, NO ISCHEMIA   CARDIOVASCULAR STRESS TEST     COLONOSCOPY     CORONARY ANGIOPLASTY WITH STENT PLACEMENT  03/2005   CORONARY ARTERY BYPASS GRAFT N/A 12/20/2019   Procedure: CORONARY ARTERY BYPASS GRAFTING (CABG) times four, using left internal mammary artery and left greater saphenous vein;  Surgeon: Ivin Poot, MD;  Location: [place];  Service: Open  Heart Surgery;  Laterality: N/A;   CORONARY BALLOON ANGIOPLASTY N/A 02/09/2021   Procedure: CORONARY BALLOON ANGIOPLASTY;  Surgeon: Martinique, Unnamed Hino M, MD;  Location: [place] [facility];  Service: Cardiovascular;  Laterality: N/A;   CORONARY STENT INTERVENTION N/A 03/16/2020   Procedure: CORONARY STENT INTERVENTION;  Surgeon: Martinique, Nicci Vaughan M, MD;  Location: [place] [facility];  Service: Cardiovascular;  Laterality: N/A;   CORONARY STENT INTERVENTION N/A 02/09/2021   Procedure: CORONARY STENT INTERVENTION;  Surgeon: Martinique, Ayssa Bentivegna M, MD;  Location: [place] [facility];  Service: Cardiovascular;  Laterality: N/A;   CORONARY STENT INTERVENTION N/A 03/31/2021    Procedure: CORONARY STENT INTERVENTION;  Surgeon: Wellington Hampshire, MD;  Location: [place] [facility];  Service: Cardiovascular;  Laterality: N/A;   CORONARY STENT PLACEMENT  03/10/2005   SUCCESSFUL STENTING OF THE MID AND CRUX OF THE RCA   CORONARY/GRAFT ACUTE MI REVASCULARIZATION N/A 03/16/2020   Procedure: Coronary/Graft Acute MI Revascularization;  Surgeon: Martinique, Lannis Lichtenwalner M, MD;  Location: [place] [facility];  Service: Cardiovascular;  Laterality: N/A;   ESOPHAGEAL DILATION     multiple procedures   HERNIA REPAIR  [id number]   umbilical hernia   INGUINAL HERNIA REPAIR  11/28/2011   Procedure: HERNIA REPAIR INGUINAL ADULT;  Surgeon: Imogene Burn. Georgette Dover, MD;  Location: [place];  Service: General;  Laterality: Right;  right inguinal hernia repair with mesh   KNEE SURGERY     Right   LEFT HEART CATH AND CORONARY ANGIOGRAPHY N/A 12/03/2019   Procedure: LEFT HEART CATH AND CORONARY ANGIOGRAPHY;  Surgeon: Jettie Booze, MD;  Location: [place] [facility];  Service: Cardiovascular;  Laterality: N/A;   LEFT HEART CATH AND CORS/GRAFTS ANGIOGRAPHY N/A 03/16/2020   Procedure: LEFT HEART CATH AND CORS/GRAFTS ANGIOGRAPHY;  Surgeon: Martinique, Bairon Klemann M, MD;  Location: [place] [facility];  Service: Cardiovascular;  Laterality: N/A;   LEFT HEART CATH AND CORS/GRAFTS ANGIOGRAPHY N/A 02/09/2021   Procedure: LEFT HEART CATH AND CORS/GRAFTS ANGIOGRAPHY;  Surgeon: Martinique, Phoenix Dresser M, MD;  Location: [place] [facility];  Service: Cardiovascular;  Laterality: N/A;   LEFT HEART CATH AND CORS/GRAFTS ANGIOGRAPHY N/A 03/31/2021   Procedure: LEFT HEART CATH AND CORS/GRAFTS ANGIOGRAPHY;  Surgeon: Wellington Hampshire, MD;  Location: [place] [facility];  Service: Cardiovascular;  Laterality: N/A;   POLYPECTOMY     TEE WITHOUT CARDIOVERSION N/A 12/20/2019   Procedure: TRANSESOPHAGEAL ECHOCARDIOGRAM (TEE);  Surgeon: Prescott Gum, Collier Salina, MD;  Location: [place];  Service: Open Heart Surgery;  Laterality: N/A;   TONSILLECTOMY  [id number]   UMBILICAL  HERNIA REPAIR  11/2006   US ECHOCARDIOGRAPHY      Current Medications: Current Meds  Medication Sig   acetaminophen (TYLENOL) 650 MG CR tablet Take 1,300 mg by mouth every 8 (eight) hours as needed for pain.   amLODipine (NORVASC) 5 MG tablet Take 1 tablet (5 mg total) by mouth daily.   aspirin EC 81 MG tablet Take 81 mg by mouth in the morning.   B Complex-C (B-COMPLEX WITH VITAMIN C) tablet Take 1 tablet by mouth daily with breakfast.    Calcium-Magnesium-Zinc (CAL-MAG-ZINC PO) Take 1 tablet by mouth daily with lunch.   cetirizine (ZYRTEC) 10 MG tablet Take 10 mg by mouth in the morning.   clopidogrel (PLAVIX) 75 MG tablet Take 1 tablet (75 mg total) by mouth daily with breakfast.   empagliflozin (JARDIANCE) 25 MG TABS tablet Take 25 mg by mouth in the morning.   Evolocumab (REPATHA SURECLICK) [id number] MG/ML  SOAJ Inject 1 Pen into the skin every 14 (fourteen) days.   finasteride (PROSCAR) 5 MG tablet Take 5 mg by mouth daily.   fluticasone (FLONASE) 50 MCG/ACT nasal spray Place 2 sprays into both nostrils daily as needed for allergies.   isosorbide mononitrate (IMDUR) 60 MG 24 hr tablet Take 1 tablet (60 mg total) by mouth daily.   memantine (NAMENDA) 5 MG tablet Take 1 tablet (5 mg at night) for 2 weeks, then increase to 1 tablet (5 mg) twice a day   metoprolol tartrate (LOPRESSOR) 25 MG tablet Take 1 tablet (25 mg total) by mouth 2 (two) times daily.   nitroGLYCERIN (NITROSTAT) 0.4 MG SL tablet Place 1 tablet (0.4 mg total) under the tongue every 5 (five) minutes as needed. (Patient taking differently: Place 0.4 mg under the tongue every 5 (five) minutes as needed for chest pain.)   OneTouch Delica Lancets [id number] MISC 1 each by Other route daily.   ONETOUCH VERIO test strip 1 each by Other route daily.   pantoprazole (PROTONIX) 40 MG tablet Take 1 tablet (40 mg total) by mouth 2 (two) times daily.   Tamsulosin HCl (FLOMAX) 0.4 MG CAPS Take 0.4 mg by mouth at bedtime.     Allergies:   Kiwi  extract, Statins, Bismuth subsalicylate, Codeine, Morphine and related, Tape, and Altace [ramipril]   Social History   Socioeconomic History   Marital status: Married    Spouse name: Not on file   Number of children: 3   Years of education: Not on file   Highest education level: Not on file  Occupational History   Occupation: Retired  Tobacco Use   Smoking status: Former    Packs/day: 1.00    Years: 22.00    Total pack years: 22.00    Types: Cigarettes, Pipe    Quit date: 12/17/2014    Years since quitting: 7.6   Smokeless tobacco: Never  Substance and Sexual Activity   Alcohol use: No    Alcohol/week: 0.0 standard drinks of alcohol   Drug use: No   Sexual activity: Not on file  Other Topics Concern   Not on file  Social History Narrative   Right handed   Drinks caffeine   One story home   Social Determinants of Health   Financial Resource Strain: Not on file  Food Insecurity: Not on file  Transportation Needs: Not on file  Physical Activity: Not on file  Stress: Not on file  Social Connections: Not on file     Family History: The patient's family history includes Cancer in his paternal grandmother; Heart disease in his brother, maternal grandfather, mother, and sister; Ovarian cancer in his paternal grandmother. There is no history of Colon cancer, Esophageal cancer, or Stomach cancer.  ROS:   Please see the history of present illness.     All other systems reviewed and are negative.  EKGs/Labs/Other Studies Reviewed:    The following studies were reviewed today:  Cath 03/31/2021 Mid LAD-1 lesion is 80% stenosed. Prox LAD lesion is 25% stenosed. Mid LAD-2 lesion is 100% stenosed. 2nd Mrg-2 lesion is 99% stenosed. RPAV lesion is 99% stenosed. Mid RCA lesion is 30% stenosed. Dist RCA lesion is 30% stenosed. Previously placed Prox RCA drug eluting stent is widely patent. The left ventricular systolic function is normal. LV end diastolic pressure is mildly  elevated. The left ventricular ejection fraction is 55-65% by visual estimate. Previously placed Dist Cx stent (unknown type) is widely patent. Mid Cx to  Dist Cx lesion is 30% stenosed. Post intervention, there is a 0% residual stenosis. A drug-eluting stent was successfully placed using a SYNERGY XD 2.50X12. 2nd Mrg-1 lesion is 90% stenosed. Post intervention, there is a 0% residual stenosis. A drug-eluting stent was successfully placed using a SYNERGY XD 2.75X24. Lat 2nd Mrg lesion is 50% stenosed.   1.  Significant underlying three-vessel coronary artery disease with patent LIMA to LAD and patent SVG to diagonal.  Chronically occluded SVG to OM 2 and SVG to right PDA.  Patent RCA stents with mild in-stent restenosis in the mid and distal stents.  Patent distal left circumflex stent with minimal restenosis.  Severe in-stent restenosis in the proximal as well as distal stent in a large OM2. 2.  Normal LV systolic function mildly elevated left ventricular end-diastolic pressure. 3.  Successful OCT guided PCI and drug-eluting stent placement to the distal and proximal OM2.  The distal stent did not cover the whole length of the previously placed stent as I was concerned about small vessel size distally and in order not to jail the inferior branch which was diseased.  I elected to restent with different platform given the quick restenosis after recent cath and balloon angioplasty.   Recommendations: Continue dual antiplatelet therapy indefinitely if possible. Aggressive treatment of risk factors. Hydrate overnight given underlying chronic kidney disease.   EKG:  EKG is  ordered today.  NSR rate 48. LAD, old inferior and anterior infarcts. No change from prior. I have personally reviewed and interpreted this study.   Recent Labs: No results found for requested labs within last 365 days.  Recent Lipid Panel    Component Value Date/Time   CHOL 109 03/31/2021 0240   CHOL 122 02/01/2021 1136    TRIG 143 03/31/2021 0240   HDL 46 03/31/2021 0240   HDL 53 02/01/2021 1136   CHOLHDL 2.4 03/31/2021 0240   VLDL 29 03/31/2021 0240   LDLCALC 34 03/31/2021 0240   LDLCALC 50 02/01/2021 1136   Dated 04/29/21: cholesterol 110, triglycerides 83, HDL 50, LDL 43.  Dated 09/09/21: A1c 8.3%/  Dated 05/17/22: cholesterol 134, triglycerides 98, HDL 59, LDL 55. Acc 9%. CMET normal   Risk Assessment/Calculations:    CHA2DS2-VASc Score =    This indicates a  % annual risk of stroke. The patient's score is based upon:        Physical Exam:    VS:  BP 138/62   Pulse (!) 48   Ht 5' 8"$  (1.727 m)   Wt 155 lb 3.2 oz (70.4 kg)   SpO2 98%   BMI 23.60 kg/m     Wt Readings from Last 3 Encounters:  08/04/22 155 lb 3.2 oz (70.4 kg)  06/13/22 154 lb (69.9 kg)  12/29/21 157 lb 6.4 oz (71.4 kg)     GEN:  Well nourished, well developed in no acute distress HEENT: Normal NECK: No JVD; No carotid bruits LYMPHATICS: No lymphadenopathy CARDIAC: RRR, no murmurs, rubs, gallops RESPIRATORY:  Clear to auscultation without rales, wheezing or rhonchi  ABDOMEN: Soft, non-tender, non-distended MUSCULOSKELETAL:  No edema; No deformity  SKIN: Warm and dry NEUROLOGIC:  Alert and oriented x 3 PSYCHIATRIC:  Normal affect   ASSESSMENT:    1. Coronary artery disease of bypass graft of native heart with stable angina pectoris ([place])   2. S/P CABG x 4   3. Hypercholesterolemia   4. Essential hypertension      PLAN:    In order of problems listed above:  CAD s/p CABG 12/2019 with subsequent PCI/DES to OM2 x2 and LCx x1 03/16/20 with cardiac cath showing occlusion of SVG to OM1 and SVG to PDA: patent LIMA to LAD and SVG to diagonal, s/p scoring balloon PCI of the OM2 for restenosis on 02/09/21. PCI of the proximal RCA with DES.  Then underwent DES x2 to distal and proximal OM 2 in late April 2022 for restenosis.   He has done well since then with infrequent angina.  Continue aspirin and Plavix indefinitely.   On Imdur, amlodipine and beta blocker. Stable class 1 angina.   Postop atrial fibrillation: Occurred after the previous bypass surgery.  No recurrence  Hypertension: Blood pressure is well controlled.  Hyperlipidemia: On Praluent - LDL at goal  DM2: per PCP  6.   Memory loss.   I will follow up in 6 months.   Signed, Tamlyn Sides Martinique, MD  08/04/2022 11:16 AM    [place]

## 2023-02-01 ENCOUNTER — Ambulatory Visit: Payer: BC Managed Care – PPO | Admitting: Cardiology

## 2023-02-02 ENCOUNTER — Telehealth: Payer: Self-pay | Admitting: Cardiology

## 2023-02-02 NOTE — Telephone Encounter (Signed)
*  STAT* If patient is at the pharmacy, call can be transferred to refill team.   1. Which medications need to be refilled? (please list name of each medication and dose if known)   amLODipine (NORVASC) 5 MG tablet  pantoprazole (PROTONIX) 40 MG tablet   2. Which pharmacy/location (including street and city if local pharmacy) is medication to be sent to? [place], [place]   3. Do they need a 30 day or 90 day supply? 90 day  Please call it in,  there e-scribe is down.

## 2023-02-03 MED ORDER — AMLODIPINE BESYLATE 5 MG PO TABS: 5.0000 mg | ORAL_TABLET | Freq: Every day | ORAL | 2 refills | Status: DC

## 2023-02-03 MED ORDER — PANTOPRAZOLE SODIUM 40 MG PO TBEC: 40.0000 mg | DELAYED_RELEASE_TABLET | Freq: Two times a day (BID) | ORAL | 2 refills | Status: DC

## 2023-02-14 ENCOUNTER — Other Ambulatory Visit: Payer: Self-pay

## 2023-02-14 ENCOUNTER — Other Ambulatory Visit: Payer: Self-pay | Admitting: Cardiology

## 2023-02-15 ENCOUNTER — Telehealth: Payer: Self-pay | Admitting: Cardiology

## 2023-02-15 MED ORDER — AMLODIPINE BESYLATE 5 MG PO TABS: 5.0000 mg | ORAL_TABLET | Freq: Every day | ORAL | 2 refills | Status: DC

## 2023-02-15 NOTE — Telephone Encounter (Signed)
Resent prescription for Amlodipine 5 mg as requested. Left  Wife detailed message refilled sent in question may cal back .

## 2023-02-15 NOTE — Telephone Encounter (Signed)
Pt c/o medication issue:  1. Name of Medication: amLODipine (NORVASC) 5 MG tablet   2. How are you currently taking this medication (dosage and times per day)? As prescribed   3. Are you having a reaction (difficulty breathing--STAT)?   4. What is your medication issue? Patient's wife is requesting this medication be sent again to pharmacy due to failed transmission.

## 2023-03-17 ENCOUNTER — Other Ambulatory Visit (HOSPITAL_COMMUNITY): Payer: Self-pay

## 2023-03-17 ENCOUNTER — Telehealth: Payer: Self-pay

## 2023-03-17 NOTE — Telephone Encounter (Signed)
Pharmacy Patient Advocate Encounter  Prior Authorization for REPATHA has been approved.    Effective dates: 03/17/23 through 03/16/24  Lamekia Nolden, CPhT Pharmacy Patient Advocate Specialist Direct Number: [phone] Fax: [phone] 

## 2023-03-17 NOTE — Telephone Encounter (Signed)
Pharmacy Patient Advocate Encounter   Received notification from [facility] that prior authorization for REPATHA is due for renewal.   PA submitted on 03/17/23 Key [id number] Status is pending    Haze Rushing, CPhT Pharmacy Patient Advocate Specialist Direct Number: [phone] Fax: [phone]

## 2023-05-08 ENCOUNTER — Other Ambulatory Visit: Payer: Self-pay | Admitting: Physician Assistant

## 2023-05-23 ENCOUNTER — Other Ambulatory Visit: Payer: Self-pay | Admitting: Cardiology

## 2023-05-31 NOTE — Progress Notes (Signed)
Cardiology Office Note:    Date:  06/02/2023   ID:  [name], DOB Jan 01, 1946, MRN [id number]  PCP:  Cleatis Polka., MD   [facility] Providers Cardiologist:  Reilley Latorre [place], MD {   Referring MD: Cleatis Polka., MD   Chief Complaint  Patient presents with   Coronary Artery Disease    History of Present Illness:    [name] is a 77 y.o. male with a hx of CAD s/p CABG 12/2019 (LIMA to LAD, SVG to diagonal, SVG to OM1 and SVG to PDA), postop atrial fibrillation, hypertension, hyperlipidemia and DM2.  He had a STEMI in April 2021 that required stenting of OM 2 and distal left circumflex artery, SVG to OM and PDA was occluded, he did have 70% proximal RCA and a diffusely diseased PLA branch that was filled with left-to-right collaterals, this was managed medically.  Postprocedure, he was placed on aspirin and Plavix.  Anticoagulation therapy and amiodarone were discontinued as his A. fib only occurred after bypass surgery.  He was placed on Praluent in May 2021 for hyperlipidemia.  He had underwent another cardiac catheterization in March 2022, this demonstrated restenosis in the stent of the OM this was treated with a scoring balloon angioplasty, proximal RCA was also stented, LAD distal left circumflex artery stent was patent.  Unfortunately patient went back to the hospital on 03/30/2021 with recurrent chest pain.  Serial troponin was only borderline elevated.  His symptom was concerning for unstable angina.  Repeat cardiac catheterization performed on 03/31/2021 showed 80% mid LAD, 100% mid LAD occlusion, 99% OM2 lesion treated with DES x2, EF 55 to 65%, widely patent distal left circumflex stent.  It is recommended he continue dual antiplatelet therapy indefinitely. During this hospitalization, Imdur was increased to 60 mg daily.  Beta-blocker was reduced down to 25 mg twice a day due to bradycardia.   On follow up today he is doing very well. Denies any chest pain. No  dyspnea or palpitations.  He is active doing yard work. Overall feels well. Notes he could not tolerate higher dose of Ozempic. Still struggling with sugar control.   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 OR;  Service: Open Heart Surgery;  Laterality: N/A;   CORONARY BALLOON  ANGIOPLASTY N/A 02/09/2021   Procedure: CORONARY BALLOON ANGIOPLASTY;  Surgeon: Sw[place], Capria Cartaya M, MD;  Location: [facility] INVASIVE CV LAB;  Service: Cardiovascular;  Laterality: N/A;   CORONARY STENT INTERVENTION N/A 03/16/2020   Procedure: CORONARY STENT INTERVENTION;  Surgeon: [place], Severus Brodzinski M, MD;  Location: [facility] INVASIVE CV LAB;  Service: Cardiovascular;  Laterality: N/A;   CORONARY STENT INTERVENTION N/A 02/09/2021   Procedure: CORONARY STENT INTERVENTION;  Surgeon: [place], Cheron Coryell M, MD;  Location: [facility] INVASIVE CV LAB;  Service: Cardiovascular;  Laterality: N/A;   CORONARY STENT INTERVENTION N/A 03/31/2021   Procedure: CORONARY STENT  INTERVENTION;  Surgeon: Iran Ouch, MD;  Location: [facility] INVASIVE CV LAB;  Service: Cardiovascular;  Laterality: N/A;   CORONARY STENT PLACEMENT  03/10/2005   SUCCESSFUL STENTING OF THE MID AND CRUX OF THE RCA   CORONARY/GRAFT ACUTE MI REVASCULARIZATION N/A 03/16/2020   Procedure: Coronary/Graft Acute MI Revascularization;  Surgeon: [place], Aeon Kessner M, MD;  Location: [facility] INVASIVE CV LAB;  Service: Cardiovascular;  Laterality: N/A;   ESOPHAGEAL DILATION     multiple procedures   HERNIA REPAIR  11/30/2005   umbilical hernia   INGUINAL HERNIA REPAIR  11/28/2011   Procedure: HERNIA REPAIR INGUINAL ADULT;  Surgeon: Wilmon Arms. Corliss Skains, MD;  Location: [facility] OR;  Service: General;  Laterality: Right;  right inguinal hernia repair with mesh   KNEE SURGERY     Right   LEFT HEART CATH AND CORONARY ANGIOGRAPHY N/A 12/03/2019   Procedure: LEFT HEART CATH AND CORONARY ANGIOGRAPHY;  Surgeon: Corky Crafts, MD;  Location: [facility] INVASIVE CV LAB;  Service: Cardiovascular;  Laterality: N/A;   LEFT HEART CATH AND CORS/GRAFTS ANGIOGRAPHY N/A 03/16/2020   Procedure: LEFT HEART CATH AND CORS/GRAFTS ANGIOGRAPHY;  Surgeon: [place], Tiandre Teall M, MD;  Location: [facility] INVASIVE CV LAB;  Service: Cardiovascular;  Laterality: N/A;   LEFT HEART CATH AND CORS/GRAFTS ANGIOGRAPHY N/A 02/09/2021   Procedure: LEFT HEART CATH AND CORS/GRAFTS ANGIOGRAPHY;  Surgeon: [place], Chizuko Trine M, MD;  Location: [facility] INVASIVE CV LAB;  Service: Cardiovascular;  Laterality: N/A;   LEFT HEART CATH AND CORS/GRAFTS ANGIOGRAPHY N/A 03/31/2021   Procedure: LEFT HEART CATH AND CORS/GRAFTS ANGIOGRAPHY;  Surgeon: Iran Ouch, MD;  Location: [facility] INVASIVE CV LAB;  Service: Cardiovascular;  Laterality: N/A;   POLYPECTOMY     TEE WITHOUT CARDIOVERSION N/A 12/20/2019   Procedure: TRANSESOPHAGEAL ECHOCARDIOGRAM (TEE);  Surgeon: Donata Clay, Theron Arista, MD;  Location: [facility] OR;  Service: Open Heart Surgery;  Laterality: N/A;   TONSILLECTOMY  1958   UMBILICAL HERNIA REPAIR  11/2006    US ECHOCARDIOGRAPHY      Current Medications: Current Meds  Medication Sig   acetaminophen (TYLENOL) 650 MG CR tablet Take 1,300 mg by mouth every 8 (eight) hours as needed for pain.   amLODipine (NORVASC) 5 MG tablet Take 1 tablet (5 mg total) by mouth daily.   aspirin EC 81 MG tablet Take 81 mg by mouth in the morning.   B Complex-C (B-COMPLEX WITH VITAMIN C) tablet Take 1 tablet by mouth daily with breakfast.    Calcium-Magnesium-Zinc (CAL-MAG-ZINC PO) Take 1 tablet by mouth daily with lunch.   cetirizine (ZYRTEC) 10 MG tablet Take 10 mg by mouth in the morning.   empagliflozin (JARDIANCE) 25 MG TABS tablet Take 25 mg by mouth in the morning.   finasteride (PROSCAR) 5 MG tablet Take 5 mg by mouth daily.   fluticasone (FLONASE) 50 MCG/ACT nasal spray Place 2 sprays into both nostrils daily as needed for allergies.  memantine (NAMENDA) 5 MG tablet TAKE ONE TABLET EVERY EVENING FOR 14 DAYS THEN INCREASE TO TAKE ONE TABLET TWICE DAILY   metoprolol tartrate (LOPRESSOR) 25 MG tablet TAKE 1 TABLET BY MOUTH 2 TIMES DAILY   OneTouch Delica Lancets 30G MISC 1 each by Other route daily.   ONETOUCH VERIO test strip 1 each by Other route daily.   OZEMPIC, 0.25 OR 0.5 MG/DOSE, 2 MG/3ML SOPN Inject into the skin.   pantoprazole (PROTONIX) 40 MG tablet Take 1 tablet (40 mg total) by mouth 2 (two) times daily.   REPATHA SURECLICK 140 MG/ML SOAJ INJECT ONE (1) PEN INTO THE SKIN EVERY 14 DAYS AS DIRECTED   Tamsulosin HCl (FLOMAX) 0.4 MG CAPS Take 0.4 mg by mouth at bedtime.   [DISCONTINUED] clopidogrel (PLAVIX) 75 MG tablet TAKE 1 TABLET BY MOUTH DAILY WITH BREAKFAST   [DISCONTINUED] isosorbide mononitrate (IMDUR) 60 MG 24 hr tablet TAKE 1 TABLET BY MOUTH DAILY   [DISCONTINUED] nitroGLYCERIN (NITROSTAT) 0.4 MG SL tablet Place 1 tablet (0.4 mg total) under the tongue every 5 (five) minutes as needed. (Patient taking differently: Place 0.4 mg under the tongue every 5 (five) minutes as needed for chest pain.)      Allergies:   Kiwi extract, Statins, Bismuth subsalicylate, Codeine, Morphine and codeine, Tape, and Altace [ramipril]   Social History   Socioeconomic History   Marital status: Married    Spouse name: Not on file   Number of children: 3   Years of education: Not on file   Highest education level: Not on file  Occupational History   Occupation: Retired  Tobacco Use   Smoking status: Former    Packs/day: 1.00    Years: 22.00    Additional pack years: 0.00    Total pack years: 22.00    Types: Cigarettes, Pipe    Quit date: 12/17/2014    Years since quitting: 8.4   Smokeless tobacco: Never  Substance and Sexual Activity   Alcohol use: No    Alcohol/week: 0.0 standard drinks of alcohol   Drug use: No   Sexual activity: Not on file  Other Topics Concern   Not on file  Social History Narrative   Right handed   Drinks caffeine   One story home   Social Determinants of Health   Financial Resource Strain: Not on file  Food Insecurity: Not on file  Transportation Needs: Not on file  Physical Activity: Not on file  Stress: Not on file  Social Connections: Not on file     Family History: The patient's family history includes Cancer in his paternal grandmother; Heart disease in his brother, maternal grandfather, mother, and sister; Ovarian cancer in his paternal grandmother. There is no history of Colon cancer, Esophageal cancer, or Stomach cancer.  ROS:   Please see the history of present illness.     All other systems reviewed and are negative.  EKGs/Labs/Other Studies Reviewed:    The following studies were reviewed today:  Cath 03/31/2021 Mid LAD-1 lesion is 80% stenosed. Prox LAD lesion is 25% stenosed. Mid LAD-2 lesion is 100% stenosed. 2nd Mrg-2 lesion is 99% stenosed. RPAV lesion is 99% stenosed. Mid RCA lesion is 30% stenosed. Dist RCA lesion is 30% stenosed. Previously placed Prox RCA drug eluting stent is widely patent. The left ventricular systolic  function is normal. LV end diastolic pressure is mildly elevated. The left ventricular ejection fraction is 55-65% by visual estimate. Previously placed Dist Cx stent (unknown type) is widely patent. Mid  Cx to Dist Cx lesion is 30% stenosed. Post intervention, there is a 0% residual stenosis. A drug-eluting stent was successfully placed using a SYNERGY XD 2.50X12. 2nd Mrg-1 lesion is 90% stenosed. Post intervention, there is a 0% residual stenosis. A drug-eluting stent was successfully placed using a SYNERGY XD 2.75X24. Lat 2nd Mrg lesion is 50% stenosed.   1.  Significant underlying three-vessel coronary artery disease with patent LIMA to LAD and patent SVG to diagonal.  Chronically occluded SVG to OM 2 and SVG to right PDA.  Patent RCA stents with mild in-stent restenosis in the mid and distal stents.  Patent distal left circumflex stent with minimal restenosis.  Severe in-stent restenosis in the proximal as well as distal stent in a large OM2. 2.  Normal LV systolic function mildly elevated left ventricular end-diastolic pressure. 3.  Successful OCT guided PCI and drug-eluting stent placement to the distal and proximal OM2.  The distal stent did not cover the whole length of the previously placed stent as I was concerned about small vessel size distally and in order not to jail the inferior branch which was diseased.  I elected to restent with different platform given the quick restenosis after recent cath and balloon angioplasty.   Recommendations: Continue dual antiplatelet therapy indefinitely if possible. Aggressive treatment of risk factors. Hydrate overnight given underlying chronic kidney disease.   EKG:  EKG is not  ordered today.     Recent Labs: No results found for requested labs within last 365 days.  Recent Lipid Panel    Component Value Date/Time   CHOL 109 03/31/2021 0240   CHOL 122 02/01/2021 1136   TRIG 143 03/31/2021 0240   HDL 46 03/31/2021 0240   HDL 53  02/01/2021 1136   CHOLHDL 2.4 03/31/2021 0240   VLDL 29 03/31/2021 0240   LDLCALC 34 03/31/2021 0240   LDLCALC 50 02/01/2021 1136   Dated 04/29/21: cholesterol 110, triglycerides 83, HDL 50, LDL 43.  Dated 09/09/21: A1c 8.3%/  Dated 05/17/22: cholesterol 134, triglycerides 98, HDL 59, LDL 55. Acc 9%. CMET normal Dated 09/08/22: A1c 8.2%  Risk Assessment/Calculations:    CHA2DS2-VASc Score =    This indicates a  % annual risk of stroke. The patient's score is based upon:        Physical Exam:    VS:  BP 137/70 (BP Location: Left Arm, Patient Position: Sitting, Cuff Size: Normal)   Pulse 60   Ht 5\' 8"  (1.727 m)   Wt 152 lb (68.9 kg)   SpO2 93%   BMI 23.11 kg/m     Wt Readings from Last 3 Encounters:  06/02/23 152 lb (68.9 kg)  12/14/22 159 lb 3.2 oz (72.2 kg)  08/04/22 155 lb 3.2 oz (70.4 kg)     GEN:  Well nourished, well developed in no acute distress HEENT: Normal NECK: No JVD; No carotid bruits LYMPHATICS: No lymphadenopathy CARDIAC: RRR, no murmurs, rubs, gallops RESPIRATORY:  Clear to auscultation without rales, wheezing or rhonchi  ABDOMEN: Soft, non-tender, non-distended MUSCULOSKELETAL:  No edema; No deformity  SKIN: Warm and dry NEUROLOGIC:  Alert and oriented x 3 PSYCHIATRIC:  Normal affect   ASSESSMENT:    1. Coronary artery disease of bypass graft of native heart with stable angina pectoris (HCC)   2. S/P CABG x 4   3. Hypercholesterolemia   4. Essential hypertension       PLAN:    In order of problems listed above:  CAD s/p CABG 12/2019 with  subsequent PCI/DES to OM2 x2 and LCx x1 03/16/20 with cardiac cath showing occlusion of SVG to OM1 and SVG to PDA: patent LIMA to LAD and SVG to diagonal, s/p scoring balloon PCI of the OM2 for restenosis on 02/09/21. PCI of the proximal RCA with DES.  Then underwent DES x2 to distal and proximal OM 2 in late April 2022 for restenosis.   He has done well since then with stable class 1-2 angina.  Continue aspirin  and Plavix indefinitely.  On Imdur, amlodipine and beta blocker.   Postop atrial fibrillation: Occurred after the previous bypass surgery.  No recurrence  Hypertension: Blood pressure is well controlled.  Hyperlipidemia: On Praluent - LDL at goal  DM2: per PCP  6.   Memory loss.   I will follow up in one year   Signed, Gehrig Patras [place], MD  06/02/2023 9:45 AM    [facility]

## 2023-06-02 ENCOUNTER — Ambulatory Visit: Payer: BC Managed Care – PPO | Attending: Cardiology | Admitting: Cardiology

## 2023-06-02 ENCOUNTER — Encounter: Payer: Self-pay | Admitting: Cardiology

## 2023-06-02 VITALS — BP 137/70 | HR 60 | Ht 68.0 in | Wt 152.0 lb

## 2023-06-02 DIAGNOSIS — I25708 Atherosclerosis of coronary artery bypass graft(s), unspecified, with other forms of angina pectoris: Secondary | ICD-10-CM

## 2023-06-02 DIAGNOSIS — I10 Essential (primary) hypertension: Secondary | ICD-10-CM

## 2023-06-02 DIAGNOSIS — Z951 Presence of aortocoronary bypass graft: Secondary | ICD-10-CM

## 2023-06-02 DIAGNOSIS — E7800 Pure hypercholesterolemia, unspecified: Secondary | ICD-10-CM | POA: Diagnosis not present

## 2023-06-02 MED ORDER — ISOSORBIDE MONONITRATE ER 60 MG PO TB24: 60.0000 mg | ORAL_TABLET | Freq: Every day | ORAL | 3 refills | Status: DC

## 2023-06-02 MED ORDER — CLOPIDOGREL BISULFATE 75 MG PO TABS: 75.0000 mg | ORAL_TABLET | Freq: Every day | ORAL | 3 refills | Status: DC

## 2023-06-02 MED ORDER — NITROGLYCERIN 0.4 MG SL SUBL: 0.4000 mg | SUBLINGUAL_TABLET | SUBLINGUAL | 3 refills | Status: AC | PRN

## 2023-06-02 NOTE — Patient Instructions (Signed)
Medication Instructions:  Continue same medications  *If you need a refill on your cardiac medications before your next appointment, please call your pharmacy*   Lab Work: None ordered   Testing/Procedures: None ordered   Follow-Up: At [facility], you and your health needs are our priority.  As part of our continuing mission to provide you with exceptional heart care, we have created designated Provider Care Teams.  These Care Teams include your primary Cardiologist (physician) and Advanced Practice Providers (APPs -  Physician Assistants and Nurse Practitioners) who all work together to provide you with the care you need, when you need it.  We recommend signing up for the patient portal called "MyChart".  Sign up information is provided on this After Visit Summary.  MyChart is used to connect with patients for Virtual Visits (Telemedicine).  Patients are able to view lab/test results, encounter notes, upcoming appointments, etc.  Non-urgent messages can be sent to your provider as well.   To learn more about what you can do with MyChart, go to [email or web address].    Your next appointment:  1 year     Call in Feb to schedule June appointment     Provider:  Dr.Jordan

## 2023-06-13 ENCOUNTER — Ambulatory Visit
Admission: EM | Admit: 2023-06-13 | Discharge: 2023-06-13 | Disposition: A | Discharge status: Home / Self Care | Payer: BC Managed Care – PPO | Attending: Emergency Medicine | Admitting: Emergency Medicine

## 2023-06-13 DIAGNOSIS — Z23 Encounter for immunization: Secondary | ICD-10-CM | POA: Diagnosis not present

## 2023-06-13 DIAGNOSIS — S61310A Laceration without foreign body of right index finger with damage to nail, initial encounter: Secondary | ICD-10-CM

## 2023-06-13 DIAGNOSIS — W5501XA Bitten by cat, initial encounter: Secondary | ICD-10-CM

## 2023-06-13 MED ORDER — TETANUS-DIPHTH-ACELL PERTUSSIS 5-2.5-18.5 LF-MCG/0.5 IM SUSY
0.5000 mL | PREFILLED_SYRINGE | Freq: Once | INTRAMUSCULAR | Status: AC
  Administered 2023-06-13: 0.5 mL via INTRAMUSCULAR

## 2023-06-13 MED ORDER — AMOXICILLIN-POT CLAVULANATE 875-125 MG PO TABS: 1.0000 | ORAL_TABLET | Freq: Two times a day (BID) | ORAL | 0 refills | Status: DC

## 2023-06-13 NOTE — ED Provider Notes (Signed)
HPI  SUBJECTIVE:  [name] is a right-handed 77 y.o. male who presents with laceration to his distal right index finger and a puncture to the nailbed after trying to rescue a feral kitten from underneath a truck this morning.  He estimates that the kitten was no more than 7 to 68 weeks old.  It ran off after biting him.  He reports mild pain and swelling in the area.  Daughter states that she washed the wound out with copious amounts of soap and water, and applied an antiseptic spray to this.  No aggravating or alleviating factors.  Patient has a past medical history of atrial fibrillation, CABG x 4, 67 stents on aspirin and Plavix, diabetes, hypertension, dementia.  Last tetanus unknown.  No history of chronic kidney disease.  PCP: [facility].  History primarily obtained from daughter.  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   Urinary hesitancy     Past Surgical History:  Procedure Laterality Date   ANKLE ARTHROPLASTY  2006   CARDIOVASCULAR STRESS TEST  01/22/2009   EF 51%, NO ISCHEMIA   CARDIOVASCULAR STRESS TEST     COLONOSCOPY     CORONARY ANGIOPLASTY WITH STENT PLACEMENT  03/2005   CORONARY ARTERY BYPASS GRAFT N/A 12/20/2019   Procedure: CORONARY ARTERY BYPASS GRAFTING (CABG) times four, using left internal mammary artery and left greater saphenous vein;  Surgeon: Kerin Perna, MD;  Location: [facility] OR;  Service: Open Heart Surgery;  Laterality: N/A;    CORONARY BALLOON ANGIOPLASTY N/A 02/09/2021   Procedure: CORONARY BALLOON ANGIOPLASTY;  Surgeon: Swaziland, Peter M, MD;  Location: [facility] INVASIVE CV LAB;  Service: Cardiovascular;  Laterality: N/A;   CORONARY STENT INTERVENTION N/A 03/16/2020   Procedure: CORONARY STENT INTERVENTION;  Surgeon: Swaziland, Peter M, MD;  Location: [facility] INVASIVE CV LAB;  Service: Cardiovascular;  Laterality: N/A;   CORONARY STENT INTERVENTION N/A 02/09/2021   Procedure: CORONARY STENT INTERVENTION;  Surgeon: Swaziland, Peter M, MD;  Location: [facility] INVASIVE CV LAB;  Service: Cardiovascular;  Laterality: N/A;   CORONARY STENT INTERVENTION N/A 03/31/2021   Procedure: CORONARY STENT INTERVENTION;  Surgeon: Iran Ouch, MD;  Location: [facility] INVASIVE CV LAB;  Service: Cardiovascular;  Laterality: N/A;   CORONARY STENT PLACEMENT  03/10/2005   SUCCESSFUL STENTING OF THE MID AND CRUX OF THE RCA   CORONARY/GRAFT ACUTE MI REVASCULARIZATION N/A 03/16/2020   Procedure: Coronary/Graft Acute MI Revascularization;  Surgeon: Swaziland, Peter M, MD;  Location: [facility] INVASIVE CV LAB;  Service: Cardiovascular;  Laterality: N/A;   ESOPHAGEAL DILATION     multiple procedures   HERNIA REPAIR  11/30/2005   umbilical hernia   INGUINAL HERNIA REPAIR  11/28/2011   Procedure: HERNIA REPAIR INGUINAL ADULT;  Surgeon: Wilmon Arms. Corliss Skains, MD;  Location: [facility] OR;  Service: General;  Laterality: Right;  right  inguinal hernia repair with mesh   KNEE SURGERY     Right   LEFT HEART CATH AND CORONARY ANGIOGRAPHY N/A 12/03/2019   Procedure: LEFT HEART CATH AND CORONARY ANGIOGRAPHY;  Surgeon: Corky Crafts, MD;  Location: [facility] INVASIVE CV LAB;  Service: Cardiovascular;  Laterality: N/A;   LEFT HEART CATH AND CORS/GRAFTS ANGIOGRAPHY N/A 03/16/2020   Procedure: LEFT HEART CATH AND CORS/GRAFTS ANGIOGRAPHY;  Surgeon: Swaziland, Peter M, MD;  Location: [facility] INVASIVE CV LAB;  Service: Cardiovascular;  Laterality: N/A;   LEFT HEART CATH AND CORS/GRAFTS ANGIOGRAPHY N/A 02/09/2021   Procedure:  LEFT HEART CATH AND CORS/GRAFTS ANGIOGRAPHY;  Surgeon: Swaziland, Peter M, MD;  Location: [facility] INVASIVE CV LAB;  Service: Cardiovascular;  Laterality: N/A;   LEFT HEART CATH AND CORS/GRAFTS ANGIOGRAPHY N/A 03/31/2021   Procedure: LEFT HEART CATH AND CORS/GRAFTS ANGIOGRAPHY;  Surgeon: Iran Ouch, MD;  Location: [facility] INVASIVE CV LAB;  Service: Cardiovascular;  Laterality: N/A;   POLYPECTOMY     TEE WITHOUT CARDIOVERSION N/A 12/20/2019   Procedure: TRANSESOPHAGEAL ECHOCARDIOGRAM (TEE);  Surgeon: Donata Clay, Theron Arista, MD;  Location: [facility] OR;  Service: Open Heart Surgery;  Laterality: N/A;   TONSILLECTOMY  1958   UMBILICAL HERNIA REPAIR  11/2006   US ECHOCARDIOGRAPHY      Family History  Problem Relation Age of Onset   Heart disease Mother    Heart disease Sister    Heart disease Brother    Heart disease Maternal Grandfather    Ovarian cancer Paternal Grandmother    Cancer Paternal Grandmother        cervical   Colon cancer Neg Hx    Esophageal cancer Neg Hx    Stomach cancer Neg Hx     Social History   Tobacco Use   Smoking status: Former    Packs/day: 1.00    Years: 22.00    Additional pack years: 0.00    Total pack years: 22.00    Types: Cigarettes, Pipe    Quit date: 12/17/2014    Years since quitting: 8.4   Smokeless tobacco: Never  Substance Use Topics   Alcohol use: No    Alcohol/week: 0.0 standard drinks of alcohol   Drug use: No    No current facility-administered medications for this encounter.  Current Outpatient Medications:    amoxicillin-clavulanate (AUGMENTIN) 875-125 MG tablet, Take 1 tablet by mouth every 12 (twelve) hours., Disp: 14 tablet, Rfl: 0   acetaminophen (TYLENOL) 650 MG CR tablet, Take 1,300 mg by mouth every 8 (eight) hours as needed for pain., Disp: , Rfl:    amLODipine (NORVASC) 5 MG tablet, Take 1 tablet (5 mg total) by mouth daily., Disp: 90 tablet, Rfl: 2   aspirin EC 81 MG tablet, Take 81 mg by mouth in the morning., Disp:  , Rfl:    B Complex-C  (B-COMPLEX WITH VITAMIN C) tablet, Take 1 tablet by mouth daily with breakfast. , Disp: , Rfl:    Calcium-Magnesium-Zinc (CAL-MAG-ZINC PO), Take 1 tablet by mouth daily with lunch., Disp: , Rfl:    cetirizine (ZYRTEC) 10 MG tablet, Take 10 mg by mouth in the morning., Disp: , Rfl:    clopidogrel (PLAVIX) 75 MG tablet, Take 1 tablet (75 mg total) by mouth daily with breakfast., Disp: 90 tablet, Rfl: 3   empagliflozin (JARDIANCE) 25 MG TABS tablet, Take 25 mg by mouth in the morning., Disp: 30 tablet, Rfl:    finasteride (PROSCAR) 5 MG tablet, Take 5 mg by mouth daily., Disp: , Rfl:  fluticasone (FLONASE) 50 MCG/ACT nasal spray, Place 2 sprays into both nostrils daily as needed for allergies., Disp: , Rfl:    isosorbide mononitrate (IMDUR) 60 MG 24 hr tablet, Take 1 tablet (60 mg total) by mouth daily., Disp: 90 tablet, Rfl: 3   memantine (NAMENDA) 5 MG tablet, TAKE ONE TABLET EVERY EVENING FOR 14 DAYS THEN INCREASE TO TAKE ONE TABLET TWICE DAILY, Disp: 60 tablet, Rfl: 3   metoprolol tartrate (LOPRESSOR) 25 MG tablet, TAKE 1 TABLET BY MOUTH 2 TIMES DAILY, Disp: 180 tablet, Rfl: 3   nitroGLYCERIN (NITROSTAT) 0.4 MG SL tablet, Place 1 tablet (0.4 mg total) under the tongue every 5 (five) minutes as needed for chest pain., Disp: 25 tablet, Rfl: 3   OneTouch Delica Lancets 30G MISC, 1 each by Other route daily., Disp: , Rfl:    ONETOUCH VERIO test strip, 1 each by Other route daily., Disp: , Rfl:    OZEMPIC, 0.25 OR 0.5 MG/DOSE, 2 MG/3ML SOPN, Inject into the skin., Disp: , Rfl:    pantoprazole (PROTONIX) 40 MG tablet, Take 1 tablet (40 mg total) by mouth 2 (two) times daily., Disp: 180 tablet, Rfl: 2   REPATHA SURECLICK 140 MG/ML SOAJ, INJECT ONE (1) PEN INTO THE SKIN EVERY 14 DAYS AS DIRECTED, Disp: 2 mL, Rfl: 11   Tamsulosin HCl (FLOMAX) 0.4 MG CAPS, Take 0.4 mg by mouth at bedtime., Disp: , Rfl:   Allergies  Allergen Reactions   Kiwi Extract Anaphylaxis and Other (See Comments)    Makes face  red, breaks out into bumps on body, also   Statins Anaphylaxis and Other (See Comments)    Myalgias, also    Bismuth Subsalicylate Nausea And Vomiting   Codeine Nausea And Vomiting   Morphine And Codeine Nausea And Vomiting   Tape Other (See Comments)    "Tape pulls off the skin"- Coban and paper tape are tolerated   Altace [Ramipril] Rash and Other (See Comments)    All-over body rash     ROS  As noted in HPI.   Physical Exam  BP 122/71 (BP Location: Right Arm)   Pulse 70   Temp 98.3 F (36.8 C) (Oral)   Resp 13   SpO2 100%   Constitutional: Well developed, well nourished, no acute distress Eyes:  EOMI, conjunctiva normal bilaterally HENT: Normocephalic, atraumatic,mucus membranes moist Respiratory: Normal inspiratory effort Cardiovascular: Normal rate GI: nondistended skin: See MSK exam Musculoskeletal: 1 cm linear laceration over the distal right index finger with puncture wound of the nail.  Minimal tenderness.  No foreign body noted.  Cap refill less than 2 seconds.  Sensation grossly intact.  No pain with range of motion of the finger.  No limitation of motion of the finger.     Neurologic: Alert & oriented x 3, no focal neuro deficits Psychiatric: Speech and behavior appropriate   ED Course   Medications  Tdap (BOOSTRIX) injection 0.5 mL (0.5 mLs Intramuscular Given 06/13/23 1236)    No orders of the defined types were placed in this encounter.   No results found for this or any previous visit (from the past 24 hour(s)). No results found.  ED Clinical Impression  1. Laceration of right index finger without foreign body with damage to nail, initial encounter   2. Cat bite, initial encounter      ED Assessment/Plan     Patient presents with a laceration to his right index finger sustained from a kitten bite.  Daughter and patient declined rabies  series.  Discussed with them rabies is uniformly fatal progressive disease, they again declined.   Daughter also declined x-rays.  Doubt foreign body.  Feel this is reasonable.  Will update tetanus.  Will have patient clean wound again, and loosely Steri-Strip the laceration because he is on 2 antiplatelets.  Deferring suturing because it is a hand bite.  Discussed with daughter of the unpredictable nature of bites to the hand.   Augmentin for 7 days, may take Tylenol as needed.  Return here for any signs of infection.  Procedure note: Had patient extensively irrigate the wound again with soap and water.  Cleaned with alcohol.  Used benzoin and used a single Steri-Strip to loosely approximate the wound edges.  Placed dressing.  Patient tolerated procedure well.  Discussed labs, imaging, MDM, treatment plan, and plan for follow-up with family. Discussed sn/sx that should prompt return to the ED. family agrees with plan.   Meds ordered this encounter  Medications   Tdap (BOOSTRIX) injection 0.5 mL   amoxicillin-clavulanate (AUGMENTIN) 875-125 MG tablet    Sig: Take 1 tablet by mouth every 12 (twelve) hours.    Dispense:  14 tablet    Refill:  0      *This clinic note was created using Scientist, clinical (histocompatibility and immunogenetics). Therefore, there may be occasional mistakes despite careful proofreading.  ?    Domenick Gong, MD 06/13/23 1335

## 2023-06-13 NOTE — ED Triage Notes (Signed)
Pt c/o bitten by stray kitten this morning. Laceration to right pointer finger.

## 2023-06-13 NOTE — Discharge Instructions (Signed)
Keep the dressing clean, dry and intact for the next 48 hours.  Try not to get this wet for the next several days.  This should come off in about 4 to 5 days.  Do not put bacitracin on the Steri-Strip as that we will make it come off.  Finish the Augmentin

## 2023-06-21 ENCOUNTER — Encounter: Payer: BC Managed Care – PPO | Admitting: Psychology

## 2023-06-28 ENCOUNTER — Encounter: Payer: BC Managed Care – PPO | Admitting: Psychology

## 2023-07-11 ENCOUNTER — Ambulatory Visit: Payer: BC Managed Care – PPO | Admitting: Physician Assistant

## 2023-07-11 ENCOUNTER — Encounter: Payer: Self-pay | Admitting: Physician Assistant

## 2023-07-11 VITALS — BP 120/61 | HR 64 | Resp 18 | Ht 68.0 in | Wt 155.0 lb

## 2023-07-11 DIAGNOSIS — F0280 Dementia in other diseases classified elsewhere without behavioral disturbance: Secondary | ICD-10-CM

## 2023-07-11 DIAGNOSIS — G309 Alzheimer's disease, unspecified: Secondary | ICD-10-CM | POA: Diagnosis not present

## 2023-07-11 MED ORDER — MEMANTINE HCL 10 MG PO TABS: 10.0000 mg | ORAL_TABLET | Freq: Two times a day (BID) | ORAL | 11 refills | Status: DC

## 2023-07-11 NOTE — Progress Notes (Signed)
Assessment/Plan:   Dementia likely due to Alzheimer's disease   [name] is 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 Patient is on memantine 5 mg twice a day.  Unfortunately, cognitive decline is noted, with MMSE today at 15/30.  Patient has very little insight into his memory loss.  He is still able to participate in ADLs, he no longer drives by himself, only short distances with his wife.     Recommendations:   Follow up in 6 months. Increase memantine to 10 mg twice daily, side effects discussed (bradycardia) Recommend good control of cardiovascular risk factors Continue to control mood as per PCP    Subjective:   This patient is accompanied in the office by his daughter who supplements the history. Previous records as well as any outside records available were reviewed prior to todays visit.   Patient was last seen on January 2024, with MMSE 24/30   Any changes in memory since last visit? "  Daughter reports that his memory is worse with more difficulty remembering recent conversations, information.  She also is more confused about names, and persons.  The other day he confused his wife with his ex-wife.  He also has developed repetitive behaviors, such as picking his skin. Repeats oneself?  Endorsed Disoriented when walking into a room? Wife endorsed.  Sometimes he does not know where he is, he does not recognize his house at times "he does not understand why all the belongings are there "-daughter says Leaving objects in unusual places?  Patient denies, but daughter reports that he loses the keys in the phone and then they are found in order places  Wandering behavior?  His daughter reports that sometimes he does not know the neighborhood.  He  does not walk alone Any personality changes since last visit?   Some, not as much.  He has poor insight into what is happening Any worsening depression?:  Sometimes he shows more agitation, frustration when not able to find things  Hallucinations or paranoia?  denies   Seizures?   denies    Any sleep changes? Does not sleep very well.  Very vivid dreams, denies REM behavior or sleepwalking   Sleep apnea?   denies     Any hygiene concerns?  "Not quite yet "-he has Independent of bathing and dressing?  Needs help to choose clothing.  Does the patient needs help with medications? Daughter and wife  in charge Who is in charge of the finances? Wife s in charge Any changes in appetite?  He forgets to eat so he has to be told to do so. Patient have trouble swallowing?  denies   Does the patient cook?  No any kitchen accidents such as leaving the stove on?   denies   Any headaches?    denies   Vision changes? denies Chronic pain?  denies   Ambulates with difficulty?    denies Recent falls or head injuries?    denies      Unilateral weakness, numbness or tingling?   denies   Any tremors?  denies   Any anosmia?    denies   Any incontinence of urine?  denies   Any bowel dysfunction? Occasional diarrhea     Patient lives with wife and daughter Does the  patient drive?No longer drives by himself, he always has his wife with him.  He drives minimal distances such as to [employer].  Initial Consult 05/21/21 The patient is seen in neurologic consultation at the request of [place], Peter M, MD for the evaluation of memory.  The patient is accompanied by wife who supplements the history.  He is a 77 year old male who had memory issues for about 2 years, although he states that after his first MI and CABG in 2021 he began to notice "little things, such as losing more frequently that he is, or coming up with the name of an object or another name".  After his second MII, his symptoms appeared to worsen, with  significant difficulty in recalling, although "stress makes it worse ".  His wife reports that when they are due to being alone, these memory issues are less evident, "he could almost make it to 100% ".  However, while they are surrounded by a crowd, he becomes easily distracted, and he is unable to recall, especially if some new information would have been presented a few minutes earlier, and trying to remember them 5 minutes later.  He reports that he may have undiagnosed ADHD.  He states that when growing up, the teachers would always reprimanded him for not paying attention, dropping out of  High School,  because he could not concentrate.  However, at home he could read the [employer], "tons of magazines and books ", and also learning to do a lot of manual activities.   He held numerous "hands-on jobs, including being a Chiropractor, Music therapist, Curator, working in the [employer], being a Occupational hygienist ".  The wife says that he would become bored with one activity, and try something new.  Currently, he likes to do yard work, cutting grass "2 acres of it ".  He also likes to play [employer], Copywriter, advertising, board games.   His mood is overall good, without depression or irritability.  He states that he did very well during the pandemic, was not feeling lonely.  He sleeps about 6 hours at night, and always had vivid dreams, no changes in the pattern.  He denies sleepwalking. He is able to do all his activities of daily living, such as bathing, dressing up, taking his medications without missing doses, or driving without getting lost.  He drives with a GPS but not afraid of getting lost, driving long distances without any issues.  He does his own finances without missing any payments.  "In fact, I had to remind my wife to pay on  time ".  He does not cook.  He used to have headaches as a youngster, but since moving from [place] to [place], the last 34 years he has not had any migraine headaches.    He denies any trauma to the head, double vision, dizziness, focal numbness or tingling, unilateral weakness or tremors.  He has a history of BPH, reporting frequent urination with residual, and has to be seen by urology soon.  He has an appointment with them.  He denies any constipation or diarrhea.  Denies any history of sleep apnea, alcohol, he has remote tobacco history, quitting a 22 pack year in 2016.  Family history is negative for dementia. 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     CT head wo contrast 12/18/19  Mild chronic ischemic white matter disease. No acute intracranial abnormality seen.     Neuropsychological Evaluation, Dr. Roseanne Reno 09/13/21 "Mr.  Hagerty is thus demonstrating likely memory storage problems and other findings suggestive of temporal lobe dysfunction as can often be observed in age-related progressive causes of cognitive decline. The fact that he has experienced altered mental status after each of his operations is also concerning given that having an underlying condition is a risk factor for delirium (and delirium in turn is a risk factor for dementia). He should be followed over time.  Earlie Raveling was seen for a psychiatric diagnostic evaluation and neuropsychological testing. He is a 77 year old, right-hand dominant man with a significant amount of central and mesial temporal volume loss on neuroimaging. His evans ratio is abnormal but he has no signs and symptoms of hydrocephalus and I do think this may be ex vacuo (it was not noted by radiology). 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 and preliminary review of test data suggest that neuropsychological findings are likely to demonstrate a similar pattern"  Past Medical History:  Diagnosis Date   Arthritis    Arthritis    Atrial fibrillation (HCC)    BPH (benign prostatic hypertrophy)    CAD (coronary artery disease)    stents placed   Diabetes mellitus     Diverticulosis    Dyspnea    when exerting self   Dysrhythmia    "extra beats"   Esophageal stricture    GERD (gastroesophageal reflux disease)    Headache(784.0)    Hemorrhoids    Hernia, inguinal, right    Hiatal hernia    Hyperlipidemia    Hypertension    Pneumonia    Staph infection    Left ankle   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   Urinary hesitancy      Past Surgical History:  Procedure Laterality Date   ANKLE ARTHROPLASTY  2006   CARDIOVASCULAR STRESS TEST  01/22/2009   EF 51%, NO ISCHEMIA   CARDIOVASCULAR STRESS TEST     COLONOSCOPY     CORONARY ANGIOPLASTY WITH STENT PLACEMENT  03/2005   CORONARY ARTERY BYPASS GRAFT N/A 12/20/2019   Procedure: CORONARY ARTERY BYPASS GRAFTING (CABG) times four, using left internal mammary artery and left greater saphenous vein;  Surgeon: Kerin Perna, MD;  Location: [facility] OR;  Service: Open Heart Surgery;  Laterality: N/A;   CORONARY BALLOON ANGIOPLASTY N/A 02/09/2021   Procedure: CORONARY BALLOON ANGIOPLASTY;  Surgeon: [place], Peter M, MD;  Location: [facility] INVASIVE CV LAB;  Service: Cardiovascular;  Laterality: N/A;   CORONARY STENT INTERVENTION N/A 03/16/2020   Procedure: CORONARY STENT INTERVENTION;  Surgeon: [place], Peter M, MD;  Location: [facility] INVASIVE CV LAB;  Service: Cardiovascular;  Laterality: N/A;   CORONARY STENT INTERVENTION N/A 02/09/2021   Procedure: CORONARY STENT INTERVENTION;  Surgeon: [place], Peter M, MD;  Location: [facility] INVASIVE CV LAB;  Service: Cardiovascular;  Laterality: N/A;   CORONARY STENT INTERVENTION N/A 03/31/2021   Procedure: CORONARY STENT INTERVENTION;  Surgeon: Iran Ouch, MD;  Location: [facility] INVASIVE CV LAB;  Service: Cardiovascular;  Laterality: N/A;   CORONARY STENT PLACEMENT  03/10/2005   SUCCESSFUL STENTING OF THE MID AND CRUX OF THE RCA   CORONARY/GRAFT ACUTE MI REVASCULARIZATION N/A 03/16/2020   Procedure: Coronary/Graft Acute MI Revascularization;  Surgeon: [place], Peter M, MD;  Location: [facility] INVASIVE  CV LAB;  Service: Cardiovascular;  Laterality: N/A;   ESOPHAGEAL DILATION     multiple procedures   HERNIA REPAIR  11/30/2005   umbilical hernia   INGUINAL HERNIA REPAIR  11/28/2011   Procedure:  HERNIA REPAIR INGUINAL ADULT;  Surgeon: Wilmon Arms. Corliss Skains, MD;  Location: [facility] OR;  Service: General;  Laterality: Right;  right inguinal hernia repair with mesh   KNEE SURGERY     Right   LEFT HEART CATH AND CORONARY ANGIOGRAPHY N/A 12/03/2019   Procedure: LEFT HEART CATH AND CORONARY ANGIOGRAPHY;  Surgeon: Corky Crafts, MD;  Location: [facility] INVASIVE CV LAB;  Service: Cardiovascular;  Laterality: N/A;   LEFT HEART CATH AND CORS/GRAFTS ANGIOGRAPHY N/A 03/16/2020   Procedure: LEFT HEART CATH AND CORS/GRAFTS ANGIOGRAPHY;  Surgeon: [place], Peter M, MD;  Location: [facility] INVASIVE CV LAB;  Service: Cardiovascular;  Laterality: N/A;   LEFT HEART CATH AND CORS/GRAFTS ANGIOGRAPHY N/A 02/09/2021   Procedure: LEFT HEART CATH AND CORS/GRAFTS ANGIOGRAPHY;  Surgeon: [place], Peter M, MD;  Location: [facility] INVASIVE CV LAB;  Service: Cardiovascular;  Laterality: N/A;   LEFT HEART CATH AND CORS/GRAFTS ANGIOGRAPHY N/A 03/31/2021   Procedure: LEFT HEART CATH AND CORS/GRAFTS ANGIOGRAPHY;  Surgeon: Iran Ouch, MD;  Location: [facility] INVASIVE CV LAB;  Service: Cardiovascular;  Laterality: N/A;   POLYPECTOMY     TEE WITHOUT CARDIOVERSION N/A 12/20/2019   Procedure: TRANSESOPHAGEAL ECHOCARDIOGRAM (TEE);  Surgeon: Donata Clay, Theron Arista, MD;  Location: [facility] OR;  Service: Open Heart Surgery;  Laterality: N/A;   TONSILLECTOMY  1958   UMBILICAL HERNIA REPAIR  11/2006   US ECHOCARDIOGRAPHY       PREVIOUS MEDICATIONS:   CURRENT MEDICATIONS:  Outpatient Encounter Medications as of 07/11/2023  Medication Sig   acetaminophen (TYLENOL) 650 MG CR tablet Take 1,300 mg by mouth every 8 (eight) hours as needed for pain.   amLODipine (NORVASC) 5 MG tablet Take 1 tablet (5 mg total) by mouth daily.   amoxicillin-clavulanate (AUGMENTIN) 875-125 MG  tablet Take 1 tablet by mouth every 12 (twelve) hours.   aspirin EC 81 MG tablet Take 81 mg by mouth in the morning.   B Complex-C (B-COMPLEX WITH VITAMIN C) tablet Take 1 tablet by mouth daily with breakfast.    Calcium-Magnesium-Zinc (CAL-MAG-ZINC PO) Take 1 tablet by mouth daily with lunch.   cetirizine (ZYRTEC) 10 MG tablet Take 10 mg by mouth in the morning.   clopidogrel (PLAVIX) 75 MG tablet Take 1 tablet (75 mg total) by mouth daily with breakfast.   empagliflozin (JARDIANCE) 25 MG TABS tablet Take 25 mg by mouth in the morning.   finasteride (PROSCAR) 5 MG tablet Take 5 mg by mouth daily.   fluticasone (FLONASE) 50 MCG/ACT nasal spray Place 2 sprays into both nostrils daily as needed for allergies.   isosorbide mononitrate (IMDUR) 60 MG 24 hr tablet Take 1 tablet (60 mg total) by mouth daily.   metoprolol tartrate (LOPRESSOR) 25 MG tablet TAKE 1 TABLET BY MOUTH 2 TIMES DAILY   nitroGLYCERIN (NITROSTAT) 0.4 MG SL tablet Place 1 tablet (0.4 mg total) under the tongue every 5 (five) minutes as needed for chest pain.   OneTouch Delica Lancets 30G MISC 1 each by Other route daily.   ONETOUCH VERIO test strip 1 each by Other route daily.   OZEMPIC, 0.25 OR 0.5 MG/DOSE, 2 MG/3ML SOPN Inject into the skin.   pantoprazole (PROTONIX) 40 MG tablet Take 1 tablet (40 mg total) by mouth 2 (two) times daily.   REPATHA SURECLICK 140 MG/ML SOAJ INJECT ONE (1) PEN INTO THE SKIN EVERY 14 DAYS AS DIRECTED   Tamsulosin HCl (FLOMAX) 0.4 MG CAPS Take 0.4 mg by mouth at bedtime.   [DISCONTINUED] memantine (NAMENDA) 5 MG tablet  TAKE ONE TABLET EVERY EVENING FOR 14 DAYS THEN INCREASE TO TAKE ONE TABLET TWICE DAILY   memantine (NAMENDA) 10 MG tablet Take 1 tablet (10 mg total) by mouth 2 (two) times daily.   No facility-administered encounter medications on file as of 07/11/2023.     Objective:     PHYSICAL EXAMINATION:    VITALS:   Vitals:   07/11/23 1254  BP: 120/61  Pulse: 64  Resp: 18  SpO2: 95%   Weight: 155 lb (70.3 kg)  Height: 5\' 8"  (1.727 [name])    GEN:  The patient appears stated age and is in NAD. HEENT:  Normocephalic, atraumatic.   Neurological examination:  General: NAD, well-groomed, appears stated age. Orientation: The patient is alert. Oriented to person, not to place or date Cranial nerves: There is good facial symmetry.The speech is fluent and clear but at times tangential. No aphasia or dysarthria. Fund of knowledge is reduced. Recent and remote memory is normal.  Attention and concentration are reduced.  Able to name objects and repeat phrases.  Hearing is reduced o conversational tone.   Delayed recall 0/5 Sensation: Sensation is intact to light touch throughout Motor: Strength is at least antigravity x4. DTR's 2/4 in UE/LE      05/21/2021    2:00 PM  Montreal Cognitive Assessment   Visuospatial/ Executive (0/5) 4  Naming (0/3) 2  Attention: Read list of digits (0/2) 2  Attention: Read list of letters (0/1) 0  Attention: Serial 7 subtraction starting at 100 (0/3) 3  Language: Repeat phrase (0/2) 2  Language : Fluency (0/1) 0  Abstraction (0/2) 1  Delayed Recall (0/5) 0  Orientation (0/6) 5  Total 19  Adjusted Score (based on education) 20       07/11/2023    4:00 PM 12/14/2022    1:00 PM 06/13/2022    2:00 PM  MMSE - Mini Mental State Exam  Orientation to time 1 5 0  Orientation to Place 2 3 5   Registration 3 3 3   Attention/ Calculation 0 4 5  Recall 0 0 0  Language- name 2 objects 2 2 2   Language- repeat 1 1 1   Language- follow 3 step command 3 3 3   Language- read & follow direction 1 1 1   Write a sentence 1 1 1   Copy design 1 1 1   Total score 15 24 22        Movement examination: Tone: There is normal tone in the UE/LE Abnormal movements:  no tremor.  No myoclonus.  No asterixis.   Coordination:  There is no decremation with RAM's. Normal finger to nose  Gait and Station: The patient has no difficulty arising out of a deep-seated chair  without the use of the hands. The patient's stride length is good.  Gait is cautious and narrow.   Thank you for allowing [place] the opportunity to participate in the care of this nice patient. Please do not hesitate to contact us for any questions or concerns.   Total time spent on today's visit was 34 minutes dedicated to this patient today, preparing to see patient, examining the patient, ordering tests and/or medications and counseling the patient, documenting clinical information in the EHR or other health record, independently interpreting results and communicating results to the patient/family, discussing treatment and goals, answering patient's questions and coordinating care.  Cc:  Cleatis Polka., MD  Marlowe Kays 07/13/2023 8:19 AM

## 2023-07-11 NOTE — Patient Instructions (Signed)
It was a pleasure to see you today at our office.   Recommendations:  Follow up in 6  months   Increase Memantine 10 mg twice daily. Side effects were discussed  Recommend hearing check     Whom to call:  Memory  decline, memory medications: Call our office [phone]   For psychiatric meds, mood meds: Please have your primary care physician manage these medications.     For assessment of decision of mental capacity and competency:  Call Dr. Erick Blinks, geriatric psychiatrist at [phone]  For guidance in geriatric dementia issues please call [facility] [phone]     If you have any severe symptoms of a stroke, or other severe issues such as confusion,severe chills or fever, etc call 911 or go to the ER as you may need to be evaluated furth     RECOMMENDATIONS FOR ALL PATIENTS WITH MEMORY PROBLEMS: 1. Continue to exercise (Recommend 30 minutes of walking everyday, or 3 hours every week) 2. Increase social interactions - continue going to [place] and enjoy social gatherings with friends and family 3. Eat healthy, avoid fried foods and eat more fruits and vegetables 4. Maintain adequate blood pressure, blood sugar, and blood cholesterol level. Reducing the risk of stroke and cardiovascular disease also helps promoting better memory. 5. Avoid stressful situations. Live a simple life and avoid aggravations. Organize your time and prepare for the next day in anticipation. 6. Sleep well, avoid any interruptions of sleep and avoid any distractions in the bedroom that may interfere with adequate sleep quality 7. Avoid sugar, avoid sweets as there is a strong link between excessive sugar intake, diabetes, and cognitive impairment We discussed the Mediterranean diet, which has been shown to help patients reduce the risk of progressive memory disorders and reduces cardiovascular risk. This includes eating fish, eat fruits and green leafy vegetables, nuts like almonds  and hazelnuts, walnuts, and also use olive oil. Avoid fast foods and fried foods as much as possible. Avoid sweets and sugar as sugar use has been linked to worsening of memory function.  There is always a concern of gradual progression of memory problems. If this is the case, then we may need to adjust level of care according to patient needs. Support, both to the patient and caregiver, should then be put into place.    The Alzheimer's Association is here all day, every day for people facing Alzheimer's disease through our free 24/7 Helpline: [phone]. The Helpline provides reliable information and support to all those who need assistance, such as individuals living with memory loss, Alzheimer's or other dementia, caregivers, health care professionals and the public.  Our highly trained and knowledgeable staff can help you with: Understanding memory loss, dementia and Alzheimer's  Medications and other treatment options  General information about aging and brain health  Skills to provide quality care and to find the best care from professionals  Legal, financial and living-arrangement decisions Our Helpline also features: Confidential care consultation provided by master's level clinicians who can help with decision-making support, crisis assistance and education on issues families face every day  Help in a caller's preferred language using our translation service that features more than 200 languages and dialects  Referrals to local community programs, services and ongoing support     FALL PRECAUTIONS: Be cautious when walking. Scan the area for obstacles that may increase the risk of trips and falls. When getting up in the mornings, sit up at the edge of the bed  for a few minutes before getting out of bed. Consider elevating the bed at the head end to avoid drop of blood pressure when getting up. Walk always in a well-lit room (use night lights in the walls). Avoid area rugs or power cords  from appliances in the middle of the walkways. Use a walker or a cane if necessary and consider physical therapy for balance exercise. Get your eyesight checked regularly.  FINANCIAL OVERSIGHT: Supervision, especially oversight when making financial decisions or transactions is also recommended.  HOME SAFETY: Consider the safety of the kitchen when operating appliances like stoves, microwave oven, and blender. Consider having supervision and share cooking responsibilities until no longer able to participate in those. Accidents with firearms and other hazards in the house should be identified and addressed as well.   ABILITY TO BE LEFT ALONE: If patient is unable to contact 911 operator, consider using LifeLine, or when the need is there, arrange for someone to stay with patients. Smoking is a fire hazard, consider supervision or cessation. Risk of wandering should be assessed by caregiver and if detected at any point, supervision and safe proof recommendations should be instituted.  MEDICATION SUPERVISION: Inability to self-administer medication needs to be constantly addressed. Implement a mechanism to ensure safe administration of the medications.   DRIVING: Regarding driving, in patients with progressive memory problems, driving will be impaired. We advise to have someone else do the driving if trouble finding directions or if minor accidents are reported. Independent driving assessment is available to determine safety of driving.   If you are interested in the driving assessment, you can contact the following:  [facility] [employer] [facility] in [place] [phone]  [facility] [phone]  [facility] [phone] or [phone]     Mediterranean Diet A Mediterranean diet refers to food and lifestyle choices that are based on the traditions of countries located on the [employer]. This way of eating has been shown to  help prevent certain conditions and improve outcomes for people who have chronic diseases, like kidney disease and heart disease. What are tips for following this plan? Lifestyle  Cook and eat meals together with your family, when possible. Drink enough fluid to keep your urine clear or pale yellow. Be physically active every day. This includes: Aerobic exercise like running or swimming. Leisure activities like gardening, walking, or housework. Get 7-8 hours of sleep each night. If recommended by your health care provider, drink red wine in moderation. This means 1 glass a day for nonpregnant women and 2 glasses a day for men. A glass of wine equals 5 oz (150 mL). Reading food labels  Check the serving size of packaged foods. For foods such as rice and pasta, the serving size refers to the amount of cooked product, not dry. Check the total fat in packaged foods. Avoid foods that have saturated fat or trans fats. Check the ingredients list for added sugars, such as corn syrup. Shopping  At the grocery store, buy most of your food from the areas near the walls of the store. This includes: Fresh fruits and vegetables (produce). Grains, beans, nuts, and seeds. Some of these may be available in unpackaged forms or large amounts (in bulk). Fresh seafood. Poultry and eggs. Low-fat dairy products. Buy whole ingredients instead of prepackaged foods. Buy fresh fruits and vegetables in-season from local farmers markets. Buy frozen fruits and vegetables in resealable bags. If you do not have access to quality fresh seafood,  buy precooked frozen shrimp or canned fish, such as tuna, salmon, or sardines. Buy small amounts of raw or cooked vegetables, salads, or olives from the deli or salad bar at your store. Stock your pantry so you always have certain foods on hand, such as olive oil, canned tuna, canned tomatoes, rice, pasta, and beans. Cooking  Cook foods with extra-virgin olive oil instead of using  butter or other vegetable oils. Have meat as a side dish, and have vegetables or grains as your main dish. This means having meat in small portions or adding small amounts of meat to foods like pasta or stew. Use beans or vegetables instead of meat in common dishes like chili or lasagna. Experiment with different cooking methods. Try roasting or broiling vegetables instead of steaming or sauteing them. Add frozen vegetables to soups, stews, pasta, or rice. Add nuts or seeds for added healthy fat at each meal. You can add these to yogurt, salads, or vegetable dishes. Marinate fish or vegetables using olive oil, lemon juice, garlic, and fresh herbs. Meal planning  Plan to eat 1 vegetarian meal one day each week. Try to work up to 2 vegetarian meals, if possible. Eat seafood 2 or more times a week. Have healthy snacks readily available, such as: Vegetable sticks with hummus. Greek yogurt. Fruit and nut trail mix. Eat balanced meals throughout the week. This includes: Fruit: 2-3 servings a day Vegetables: 4-5 servings a day Low-fat dairy: 2 servings a day Fish, poultry, or lean meat: 1 serving a day Beans and legumes: 2 or more servings a week Nuts and seeds: 1-2 servings a day Whole grains: 6-8 servings a day Extra-virgin olive oil: 3-4 servings a day Limit red meat and sweets to only a few servings a month What are my food choices? Mediterranean diet Recommended Grains: Whole-grain pasta. Brown rice. Bulgar wheat. Polenta. Couscous. Whole-wheat bread. [place]pah Cobb. Vegetables: Artichokes. Beets. Broccoli. Cabbage. Carrots. Eggplant. Green beans. Chard. Kale. Spinach. Onions. Leeks. Peas. Squash. Tomatoes. Peppers. Radishes. Fruits: Apples. Apricots. Avocado. Berries. Bananas. Cherries. Dates. Figs. Grapes. Lemons. Melon. Oranges. Peaches. Plums. Pomegranate. Meats and other protein foods: Beans. Almonds. Sunflower seeds. Pine nuts. Peanuts. Cod. Salmon. Scallops. Shrimp. Tuna.  Tilapia. Clams. Oysters. Eggs. Dairy: Low-fat milk. Cheese. Greek yogurt. Beverages: Water. Red wine. Herbal tea. Fats and oils: Extra virgin olive oil. Avocado oil. Grape seed oil. Sweets and desserts: [place] yogurt with honey. Baked apples. Poached pears. Trail mix. Seasoning and other foods: Basil. Cilantro. Coriander. Cumin. Mint. Parsley. Sage. Rosemary. Tarragon. Garlic. Oregano. Thyme. Pepper. Balsalmic vinegar. Tahini. Hummus. Tomato sauce. Olives. Mushrooms. Limit these Grains: Prepackaged pasta or rice dishes. Prepackaged cereal with added sugar. Vegetables: Deep fried potatoes (french fries). Fruits: Fruit canned in syrup. Meats and other protein foods: Beef. Pork. Lamb. Poultry with skin. Hot dogs. Tomasa Blase. Dairy: Ice cream. Sour cream. Whole milk. Beverages: Juice. Sugar-sweetened soft drinks. Beer. Liquor and spirits. Fats and oils: Butter. Canola oil. Vegetable oil. Beef fat (tallow). Lard. Sweets and desserts: Cookies. Cakes. Pies. Candy. Seasoning and other foods: Mayonnaise. Premade sauces and marinades. The items listed may not be a complete list. Talk with your dietitian about what dietary choices are right for you. Summary The Mediterranean diet includes both food and lifestyle choices. Eat a variety of fresh fruits and vegetables, beans, nuts, seeds, and whole grains. Limit the amount of red meat and sweets that you eat. Talk with your health care provider about whether it is safe for you to drink red wine in moderation.  This means 1 glass a day for nonpregnant women and 2 glasses a day for men. A glass of wine equals 5 oz (150 mL). This information is not intended to replace advice given to you by your health care provider. Make sure you discuss any questions you have with your health care provider. Document Released: 07/14/2016 Document Revised: 08/16/2016 Document Reviewed: 07/14/2016 Elsevier Interactive Patient Education  2017 [employer].

## 2023-07-28 ENCOUNTER — Other Ambulatory Visit: Payer: Self-pay | Admitting: Cardiology

## 2023-08-05 ENCOUNTER — Other Ambulatory Visit: Payer: Self-pay | Admitting: Cardiology

## 2023-08-14 ENCOUNTER — Ambulatory Visit: Payer: Self-pay | Admitting: Cardiology

## 2023-10-20 ENCOUNTER — Other Ambulatory Visit: Payer: Self-pay | Admitting: Cardiology

## 2023-11-19 ENCOUNTER — Ambulatory Visit: Payer: Self-pay

## 2024-01-12 ENCOUNTER — Ambulatory Visit: Payer: 59 | Admitting: Physician Assistant

## 2024-01-12 ENCOUNTER — Encounter: Payer: Self-pay | Admitting: Physician Assistant

## 2024-01-12 VITALS — BP 110/70 | HR 92 | Resp 18 | Ht 68.0 in | Wt 159.0 lb

## 2024-01-12 DIAGNOSIS — F0280 Dementia in other diseases classified elsewhere without behavioral disturbance: Secondary | ICD-10-CM | POA: Diagnosis not present

## 2024-01-12 DIAGNOSIS — G309 Alzheimer's disease, unspecified: Secondary | ICD-10-CM | POA: Diagnosis not present

## 2024-01-12 MED ORDER — MEMANTINE HCL 10 MG PO TABS: 10.0000 mg | ORAL_TABLET | Freq: Two times a day (BID) | ORAL | 3 refills | Status: DC

## 2024-01-12 NOTE — Patient Instructions (Signed)
 It was a pleasure to see you today at our office.   Recommendations:  Follow up in 6  months Continue Memantine  10 mg twice daily. Side effects were discussed  Recommend hearing check   Cancel the neuropsych appointment   Whom to call:  Memory  decline, memory medications: Call our office [phone]   For psychiatric meds, mood meds: Please have your primary care physician manage these medications.     For assessment of decision of mental capacity and competency:  Call Dr. Rosaline Nine, geriatric psychiatrist at [phone]  For guidance in geriatric dementia issues please call [facility] [phone]     If you have any severe symptoms of a stroke, or other severe issues such as confusion,severe chills or fever, etc call 911 or go to the ER as you may need to be evaluated furth     RECOMMENDATIONS FOR ALL PATIENTS WITH MEMORY PROBLEMS: 1. Continue to exercise (Recommend 30 minutes of walking everyday, or 3 hours every week) 2. Increase social interactions - continue going to [place] and enjoy social gatherings with friends and family 3. Eat healthy, avoid fried foods and eat more fruits and vegetables 4. Maintain adequate blood pressure, blood sugar, and blood cholesterol level. Reducing the risk of stroke and cardiovascular disease also helps promoting better memory. 5. Avoid stressful situations. Live a simple life and avoid aggravations. Organize your time and prepare for the next day in anticipation. 6. Sleep well, avoid any interruptions of sleep and avoid any distractions in the bedroom that may interfere with adequate sleep quality 7. Avoid sugar, avoid sweets as there is a strong link between excessive sugar intake, diabetes, and cognitive impairment We discussed the Mediterranean diet, which has been shown to help patients reduce the risk of progressive memory disorders and reduces cardiovascular risk. This includes eating fish, eat fruits and green leafy  vegetables, nuts like almonds and hazelnuts, walnuts, and also use olive oil. Avoid fast foods and fried foods as much as possible. Avoid sweets and sugar as sugar use has been linked to worsening of memory function.  There is always a concern of gradual progression of memory problems. If this is the case, then we may need to adjust level of care according to patient needs. Support, both to the patient and caregiver, should then be put into place.    The Alzheimer's Association is here all day, every day for people facing Alzheimer's disease through our free 24/7 Helpline: [phone]. The Helpline provides reliable information and support to all those who need assistance, such as individuals living with memory loss, Alzheimer's or other dementia, caregivers, health care professionals and the public.  Our highly trained and knowledgeable staff can help you with: Understanding memory loss, dementia and Alzheimer's  Medications and other treatment options  General information about aging and brain health  Skills to provide quality care and to find the best care from professionals  Legal, financial and living-arrangement decisions Our Helpline also features: Confidential care consultation provided by master's level clinicians who can help with decision-making support, crisis assistance and education on issues families face every day  Help in a caller's preferred language using our translation service that features more than 200 languages and dialects  Referrals to local community programs, services and ongoing support     FALL PRECAUTIONS: Be cautious when walking. Scan the area for obstacles that may increase the risk of trips and falls. When getting up in the mornings, sit up at the edge of  the bed for a few minutes before getting out of bed. Consider elevating the bed at the head end to avoid drop of blood pressure when getting up. Walk always in a well-lit room (use night lights in the walls).  Avoid area rugs or power cords from appliances in the middle of the walkways. Use a walker or a cane if necessary and consider physical therapy for balance exercise. Get your eyesight checked regularly.  FINANCIAL OVERSIGHT: Supervision, especially oversight when making financial decisions or transactions is also recommended.  HOME SAFETY: Consider the safety of the kitchen when operating appliances like stoves, microwave oven, and blender. Consider having supervision and share cooking responsibilities until no longer able to participate in those. Accidents with firearms and other hazards in the house should be identified and addressed as well.   ABILITY TO BE LEFT ALONE: If patient is unable to contact 911 operator, consider using LifeLine, or when the need is there, arrange for someone to stay with patients. Smoking is a fire hazard, consider supervision or cessation. Risk of wandering should be assessed by caregiver and if detected at any point, supervision and safe proof recommendations should be instituted.  MEDICATION SUPERVISION: Inability to self-administer medication needs to be constantly addressed. Implement a mechanism to ensure safe administration of the medications.   DRIVING: Regarding driving, in patients with progressive memory problems, driving will be impaired. We advise to have someone else do the driving if trouble finding directions or if minor accidents are reported. Independent driving assessment is available to determine safety of driving.   If you are interested in the driving assessment, you can contact the following:  [employer] in [place] [phone]  [facility] [phone]  [facility] [phone] or [phone]     Mediterranean Diet A Mediterranean diet refers to food and lifestyle choices that are based on the traditions of countries located on the [employer]. This  way of eating has been shown to help prevent certain conditions and improve outcomes for people who have chronic diseases, like kidney disease and heart disease. What are tips for following this plan? Lifestyle  Cook and eat meals together with your family, when possible. Drink enough fluid to keep your urine clear or pale yellow. Be physically active every day. This includes: Aerobic exercise like running or swimming. Leisure activities like gardening, walking, or housework. Get 7-8 hours of sleep each night. If recommended by your health care provider, drink red wine in moderation. This means 1 glass a day for nonpregnant women and 2 glasses a day for men. A glass of wine equals 5 oz (150 mL). Reading food labels  Check the serving size of packaged foods. For foods such as rice and pasta, the serving size refers to the amount of cooked product, not dry. Check the total fat in packaged foods. Avoid foods that have saturated fat or trans fats. Check the ingredients list for added sugars, such as corn syrup. Shopping  At the grocery store, buy most of your food from the areas near the walls of the store. This includes: Fresh fruits and vegetables (produce). Grains, beans, nuts, and seeds. Some of these may be available in unpackaged forms or large amounts (in bulk). Fresh seafood. Poultry and eggs. Low-fat dairy products. Buy whole ingredients instead of prepackaged foods. Buy fresh fruits and vegetables in-season from local farmers markets. Buy frozen fruits and vegetables in resealable bags. If you do not have access to quality  fresh seafood, buy precooked frozen shrimp or canned fish, such as tuna, salmon, or sardines. Buy small amounts of raw or cooked vegetables, salads, or olives from the deli or salad bar at your store. Stock your pantry so you always have certain foods on hand, such as olive oil, canned tuna, canned tomatoes, rice, pasta, and beans. Cooking  Cook foods with  extra-virgin olive oil instead of using butter or other vegetable oils. Have meat as a side dish, and have vegetables or grains as your main dish. This means having meat in small portions or adding small amounts of meat to foods like pasta or stew. Use beans or vegetables instead of meat in common dishes like chili or lasagna. Experiment with different cooking methods. Try roasting or broiling vegetables instead of steaming or sauteing them. Add frozen vegetables to soups, stews, pasta, or rice. Add nuts or seeds for added healthy fat at each meal. You can add these to yogurt, salads, or vegetable dishes. Marinate fish or vegetables using olive oil, lemon juice, garlic, and fresh herbs. Meal planning  Plan to eat 1 vegetarian meal one day each week. Try to work up to 2 vegetarian meals, if possible. Eat seafood 2 or more times a week. Have healthy snacks readily available, such as: Vegetable sticks with hummus. Greek yogurt. Fruit and nut trail mix. Eat balanced meals throughout the week. This includes: Fruit: 2-3 servings a day Vegetables: 4-5 servings a day Low-fat dairy: 2 servings a day Fish, poultry, or lean meat: 1 serving a day Beans and legumes: 2 or more servings a week Nuts and seeds: 1-2 servings a day Whole grains: 6-8 servings a day Extra-virgin olive oil: 3-4 servings a day Limit red meat and sweets to only a few servings a month What are my food choices? Mediterranean diet Recommended Grains: Whole-grain pasta. Brown rice. Bulgar wheat. Polenta. Couscous. Whole-wheat bread. [place]. Vegetables: Artichokes. Beets. Broccoli. Cabbage. Carrots. Eggplant. Green beans. Chard. Kale. Spinach. Onions. Leeks. Peas. Squash. Tomatoes. Peppers. Radishes. Fruits: Apples. Apricots. Avocado. Berries. Bananas. Cherries. Dates. Figs. Grapes. Lemons. Melon. Oranges. Peaches. Plums. Pomegranate. Meats and other protein foods: Beans. Almonds. Sunflower seeds. Pine nuts. Peanuts. Cod.  Salmon. Scallops. Shrimp. Tuna. Tilapia. Clams. Oysters. Eggs. Dairy: Low-fat milk. Cheese. Greek yogurt. Beverages: Water. Red wine. Herbal tea. Fats and oils: Extra virgin olive oil. Avocado oil. Grape seed oil. Sweets and desserts: Greek yogurt with honey. Baked apples. Poached pears. Trail mix. Seasoning and other foods: Basil. Cilantro. Coriander. Cumin. Mint. Parsley. Sage. Rosemary. Tarragon. Garlic. Oregano. Thyme. Pepper. Balsalmic vinegar. Tahini. Hummus. Tomato sauce. Olives. Mushrooms. Limit these Grains: Prepackaged pasta or rice dishes. Prepackaged cereal with added sugar. Vegetables: Deep fried potatoes (french fries). Fruits: Fruit canned in syrup. Meats and other protein foods: Beef. Pork. Lamb. Poultry with skin. Hot dogs. [place]. Dairy: Ice cream. Sour cream. Whole milk. Beverages: Juice. Sugar-sweetened soft drinks. Beer. Liquor and spirits. Fats and oils: Butter. Canola oil. Vegetable oil. Beef fat (tallow). Lard. Sweets and desserts: Cookies. Cakes. Pies. Candy. Seasoning and other foods: Mayonnaise. Premade sauces and marinades. The items listed may not be a complete list. Talk with your dietitian about what dietary choices are right for you. Summary The Mediterranean diet includes both food and lifestyle choices. Eat a variety of fresh fruits and vegetables, beans, nuts, seeds, and whole grains. Limit the amount of red meat and sweets that you eat. Talk with your health care provider about whether it is safe for you to drink red wine  in moderation. This means 1 glass a day for nonpregnant women and 2 glasses a day for men. A glass of wine equals 5 oz (150 mL). This information is not intended to replace advice given to you by your health care provider. Make sure you discuss any questions you have with your health care provider. Document Released: 07/14/2016 Document Revised: 08/16/2016 Document Reviewed: 07/14/2016 Elsevier Interactive Patient Education  2017 [employer].

## 2024-01-12 NOTE — Progress Notes (Signed)
 Assessment/Plan:   Dementia due to Alzheimer's disease  [name] is a very pleasant 78 y.o. 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 Patient is currently on memantine  10 mg twice daily. Cognitive decline noted, MMSE 16/30. unfortunately, unable to add ACHI due to bradycardia. He has progressed to dementia due to AD. He still able to participate in some ADLs, no longer drives.    Follow up in 6  months. Will cancel the neuropsych evaluation scheduled for April 2025 given worsening of his memory and performance. Continue memantine  10 mg twice daily, side effects discussed (bradycardia) Recommend good control of her cardiovascular risk factors Continue to control mood as per PCP     Subjective:    This patient is accompanied in the office by his wife who supplements the history.  Previous records as well as any outside records available were reviewed prior to todays visit. Patient was last seen on 07/11/2023 with MMSE 16/30    Any changes in memory since last visit?   Memory is about the same -wife says.  He has very little insight into his condition.  He is more confused about names and people, he has more difficulty remembering recent conversations and information. LTM is fair.  repeats oneself?  Endorsed Disoriented when walking into a room?  Sometimes he does not know where his house is or where he is.  Leaving objects?  Endorsed by his daughter   Wandering behavior?  He has been disoriented outside, does not recognize his neighborhood.  He never walks alone. Any personality changes since last visit?  denies.  He has poor insight into his condition. Any worsening depression?:  Denies.   Hallucinations or paranoia?  Denies.   Seizures? denies    Any sleep changes?  Sleeps well.  Denies  vivid dreams, REM behavior or sleepwalking   Sleep apnea?   Denies.   Any hygiene concerns? Denies.  Independent of bathing and dressing?  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 Does the patient needs help with medications?  Daughter and his wife are in charge   Who is in charge of the finances?  Wife  is in charge     Any changes in appetite? He is always hungry.  Patient have trouble swallowing? Denies.   Does the patient cook? No Any headaches?   denies   Chronic back pain  denies   Ambulates with difficulty? Has not been walking frequently. Working on that.     Recent falls or head injuries? denies     Unilateral weakness, numbness or tingling? denies   Any tremors?  Denies   Any anosmia?  Denies   Any incontinence of urine?  Endorsed, wears diapers Any bowel dysfunction?  Occasional diarrhea    Patient lives with his wife and daughter  Does the patient drive? No longer drives     Initial Consult 05/21/21 The patient is seen in neurologic consultation at the request of Jordan, Peter M, MD for the evaluation of memory.  The patient is accompanied by wife who supplements the history.  He is a 78 year old male who had memory issues for about 2 years, although he states that after his first MI and CABG in 2021 he began to notice little things, such as losing more frequently that  he is, or coming up with the name of an object or another name.  After his second MII, his symptoms appeared to worsen, with significant difficulty in recalling, although stress makes it worse .  His wife reports that when they are due to being alone, these memory issues are less evident, he could almost make it to 100% .  However, while they are surrounded by a crowd, he becomes easily distracted, and he is unable to recall, especially if some new information would have been presented a few minutes earlier, and trying to remember them 5 minutes later.  He reports that he may have  undiagnosed ADHD.  He states that when growing up, the teachers would always reprimanded him for not paying attention, dropping out of  High School,  because he could not concentrate.  However, at home he could read the [employer], tons of magazines and books , and also learning to do a lot of manual activities.   He held numerous hands-on jobs, including being a chiropractor, music therapist, curator, working in the [employer], being a occupational hygienist .  The wife says that he would become bored with one activity, and try something new.  Currently, he likes to do yard work, cutting grass 2 acres of it .  He also likes to play [employer], copywriter, advertising, board games.   His mood is overall good, without depression or irritability.  He states that he did very well during the pandemic, was not feeling lonely.  He sleeps about 6 hours at night, and always had vivid dreams, no changes in the pattern.  He denies sleepwalking. He is able to do all his activities of daily living, such as bathing, dressing up, taking his medications without missing doses, or driving without getting lost.  He drives with a GPS but not afraid of getting lost, driving long distances without any issues.  He does his own finances without missing any payments.  In fact, I had to remind my wife to pay on  time .  He does not cook.  He used to have headaches as a youngster, but since moving from [place]  to [place] , the last 34 years he has not had any migraine headaches.   He denies any trauma to the head, double vision, dizziness, focal numbness or tingling, unilateral weakness or tremors.  He has a history of BPH, reporting frequent urination with residual, and has to be seen by urology soon.  He has an appointment with them.  He denies any constipation or diarrhea.  Denies any history of sleep apnea, alcohol, he has remote tobacco history, quitting a 22 pack year in 2016.  Family history is negative for dementia. 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     CT head wo contrast 12/18/19  Mild chronic ischemic white matter disease. No acute intracranial abnormality seen.     Neuropsychological Evaluation, Dr. Jackquline 09/13/21 Mr. Krider is thus demonstrating likely memory storage problems and other findings suggestive of temporal lobe dysfunction as can often be observed in age-related progressive causes of cognitive decline. The fact that he has experienced altered mental status after each of his operations is also concerning given that having an underlying condition is a risk factor for delirium (and delirium in turn is a risk factor for dementia). He should be followed over time.  [name] was seen for a psychiatric diagnostic evaluation and neuropsychological testing. He is a 78 year old, right-hand dominant man with a  significant amount of central and mesial temporal volume loss on neuroimaging. His evans ratio is abnormal but he has no signs and symptoms of hydrocephalus and I do think this may be ex vacuo (it was not noted by radiology). 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 and preliminary review of test data suggest that neuropsychological findings are likely to demonstrate a similar pattern   PREVIOUS MEDICATIONS:   CURRENT MEDICATIONS:  Outpatient Encounter Medications as of 01/12/2024  Medication Sig   acetaminophen  (TYLENOL ) 650 MG CR tablet Take 1,300 mg by mouth every 8 (eight) hours as needed for pain.   amLODipine  (NORVASC ) 5 MG tablet TAKE ONE TABLET BY MOUTH EVERY DAY   amoxicillin -clavulanate (AUGMENTIN ) 875-125 MG tablet Take 1 tablet by mouth every 12 (twelve) hours.   aspirin  EC 81 MG tablet Take 81 mg by mouth in the morning.   B Complex-C (B-COMPLEX WITH VITAMIN C) tablet Take 1 tablet by mouth daily with breakfast.    Calcium -Magnesium -Zinc  (CAL-MAG-ZINC  PO) Take 1 tablet by mouth daily with lunch.   cetirizine (ZYRTEC) 10 MG  tablet Take 10 mg by mouth in the morning.   clopidogrel  (PLAVIX ) 75 MG tablet Take 1 tablet (75 mg total) by mouth daily with breakfast.   empagliflozin  (JARDIANCE ) 25 MG TABS tablet Take 25 mg by mouth in the morning.   finasteride (PROSCAR) 5 MG tablet Take 5 mg by mouth daily.   fluticasone  (FLONASE ) 50 MCG/ACT nasal spray Place 2 sprays into both nostrils daily as needed for allergies.   isosorbide  mononitrate (IMDUR ) 60 MG 24 hr tablet Take 1 tablet (60 mg total) by mouth daily.   memantine  (NAMENDA ) 10 MG tablet Take 1 tablet (10 mg total) by mouth 2 (two) times daily.   metoprolol  tartrate (LOPRESSOR ) 25 MG tablet TAKE 1 TABLET BY MOUTH 2 TIMES DAILY   nitroGLYCERIN  (NITROSTAT ) 0.4 MG SL tablet Place 1 tablet (0.4 mg total) under the tongue every 5 (five) minutes as needed for chest pain.   OneTouch Delica Lancets 30G MISC 1 each by Other route daily.   ONETOUCH VERIO test strip 1 each by Other route daily.   OZEMPIC, 0.25 OR 0.5 MG/DOSE, 2 MG/3ML SOPN Inject into the skin.   pantoprazole  (PROTONIX ) 40 MG tablet Take 1 tablet (40 mg total) by mouth 2 (two) times daily.   REPATHA  SURECLICK 140 MG/ML SOAJ INJECT ONE (1) PEN INTO THE SKIN EVERY 14 DAYS AS DIRECTED   Tamsulosin  HCl (FLOMAX ) 0.4 MG CAPS Take 0.4 mg by mouth at bedtime.   [DISCONTINUED] memantine  (NAMENDA ) 10 MG tablet Take 1 tablet (10 mg total) by mouth 2 (two) times daily.   No facility-administered encounter medications on file as of 01/12/2024.       01/12/2024    2:00 PM 07/11/2023    4:00 PM 12/14/2022    1:00 PM  MMSE - Mini Mental State Exam  Orientation to time 0 1 5  Orientation to Place 0 2 3  Registration 3 3 3   Attention/ Calculation 4 0 4  Recall 0 0 0  Language- name 2 objects 2 2 2   Language- repeat 1 1 1   Language- follow 3 step command 3 3 3   Language- read & follow direction 1 1 1   Write a sentence 1 1 1   Copy design 1 1 1   Total score 16 15 24       05/21/2021    2:00 PM  Montreal Cognitive  Assessment  Visuospatial/ Executive (0/5) 4  Naming (0/3) 2  Attention: Read list of digits (0/2) 2  Attention: Read list of letters (0/1) 0  Attention: Serial 7 subtraction starting at 100 (0/3) 3  Language: Repeat phrase (0/2) 2  Language : Fluency (0/1) 0  Abstraction (0/2) 1  Delayed Recall (0/5) 0  Orientation (0/6) 5  Total 19  Adjusted Score (based on education) 20    Objective:     PHYSICAL EXAMINATION:    VITALS:   Vitals:   01/12/24 1308  BP: 110/70  Pulse: 92  Resp: 18  SpO2: 95%  Weight: 159 lb (72.1 kg)  Height: 5' 8 (1.727 m)    GEN:  The patient appears stated age and is in NAD. HEENT:  Normocephalic, atraumatic.   Neurological examination:  General: NAD, well-groomed, appears stated age. Orientation: The patient is alert. Oriented to person, not to place and date Cranial nerves: There is good facial symmetry.The speech is fluent and clear, tangential. No aphasia or dysarthria. Fund of knowledge is reduced. Recent and remote memory are impaired. Attention and concentration are reduced.  Able to name objects and repeat phrases.  Hearing is reduced to conversational tone.   Sensation: Sensation is intact to light touch throughout Motor: Strength is at least antigravity x4. DTR's 2/4 in UE/LE     Movement examination: Tone: There is normal tone in the UE/LE Abnormal movements:  no tremor.  No myoclonus.  No asterixis.   Coordination:  There is no decremation with RAM's. Normal finger to nose  Gait and Station: The patient has no  difficulty arising out of a deep-seated chair without the use of the hands. The patient's stride length is good.  Gait is cautious and narrow.    Thank you for allowing us  the opportunity to participate in the care of this nice patient. Please do not hesitate to contact us  for any questions or concerns.   Total time spent on today's visit was 32 minutes dedicated to this patient today, preparing to see patient, examining the  patient, ordering tests and/or medications and counseling the patient, documenting clinical information in the EHR or other health record, independently interpreting results and communicating results to the patient/family, discussing treatment and goals, answering patient's questions and coordinating care.  Cc:  [name]., MD  Camie Sevin 01/12/2024 2:27 PM

## 2024-02-16 ENCOUNTER — Other Ambulatory Visit (HOSPITAL_COMMUNITY): Payer: Self-pay

## 2024-02-16 ENCOUNTER — Telehealth: Payer: Self-pay | Admitting: Pharmacy Technician

## 2024-02-16 NOTE — Telephone Encounter (Signed)
 Pharmacy Patient Advocate Encounter   Received notification from Fax that prior authorization for repatha is required/requested.   Insurance verification completed.   The patient is insured through [facility] .   Per test claim: PA required; PA submitted to above mentioned insurance via CoverMyMeds Key/confirmation #/EOC [id number] Status is pending

## 2024-02-16 NOTE — Telephone Encounter (Signed)
 Pharmacy Patient Advocate Encounter  Received notification from [facility] that Prior Authorization for repatha has been APPROVED from 02/16/24 to 02/15/25. Unable to obtain price due to refill too soon rejection, last fill date 02/12/24 next available fill date3/31/25   PA #/Case ID/Reference #:  [id number]

## 2024-02-25 NOTE — Progress Notes (Unsigned)
 Cardiology Office Note:  .   Date:  02/27/2024  ID:  [name], DOB September 15, 1946, MRN [id number] PCP: Cleatis Polka., MD  [facility] Providers Cardiologist:  Peter Swaziland, MD     History of Present Illness: .   [name] is a 78 y.o. 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  He had a STEMI in April 2021 that required stenting of OM2 and distal left circumflex artery, SVG to OM and PDA was occluded.  He had 70% proximal RCA lesion and a diffusely diseased PLA branch that was filled with left-to-right collaterals, this was managed medically.  Postprocedure, he was placed on aspirin and Plavix.  Anticoagulation therapy and amiodarone were discontinued as his A-fib only occurred after bypass surgery.  He was placed on Praluent for cholesterol control.  He had another cardiac catheterization in March 2022 which revealed restenosis of the stent, this was treated with scoring balloon angioplasty, proximal RCA was also stented, LAD and the distal left circumflex artery stent were patent.  He had recurrent chest pain and was hospitalized in April 2022.  Troponin was borderline elevated.  He underwent another cardiac catheterization on 03/31/2021 which showed 80% mid LAD lesion, 100% mid LAD occlusion, 99% OM2 lesion treated with DES x 2, EF 55 to 65%, widely patent distal left circumflex stent.  It was recommended he continue dual antiplatelet therapy indefinitely.  Imdur was increased to 60 mg daily.  Beta-blocker was reduced due to bradycardia.  I last saw the patient in May 2022 at which time metoprolol was further reduced.  He was last seen by Dr. Swaziland in June 2024 at which time he was doing well without any chest pain or shortness of breath.  He was however struggling with sugar control.  He presents today for follow-up.  EKG shows she has went back into atrial fibrillation  with a controlled heart rate of 69 bpm.  He has no cardiac awareness.  He denies any recent chest pain and worsening shortness of breath.  He saw his PCP Dr. Clelia Croft recently who noted he had lower extremity edema and stopped his amlodipine and added 20 mg daily of Lasix.  He only took the 20 mg Lasix only for 3 days and he has managed to lose about 7 pounds and the lower extremity edema is essentially all resolved other than trace amount of edema in the ankle area.  I recommend a repeat echocardiogram in the next few weeks.  He will have basic metabolic panel and  CBC this Friday in [facility].  I will bring the patient back in about 4 to 6 weeks to reassess the symptom and to discuss possibility of cardioversion at that time.  I have discussed the case with DOD, we will stop his aspirin and put him back on Eliquis 5 mg twice a day.  He will continue on the Plavix given the previous recommendation of lifelong antiplatelet therapy.  ROS:   He denies chest pain, palpitations, dyspnea, pnd, orthopnea, n, v, dizziness, syncope, weight gain, or early satiety. All other systems reviewed and are otherwise negative except as noted above.   He had recent lower extremity edema, however this has resolved.   Studies Reviewed: Marland Kitchen   EKG Interpretation Date/Time:  Monday February 26 2024 09:05:33 EDT Ventricular Rate:  69 PR Interval:    QRS Duration:  128 QT Interval:  452 QTC  Calculation: 484 R Axis:   138  Text Interpretation: Atrial fibrillation Non-specific intra-ventricular conduction block Confirmed by Azalee Course [phone]) on 02/27/2024 7:36:43 PM    Cardiac Studies & Procedures   ______________________________________________________________________________________________ CARDIAC CATHETERIZATION  CARDIAC CATHETERIZATION 03/31/2021  Narrative  Mid LAD-1 lesion is 80% stenosed.  Prox LAD lesion is 25% stenosed.  Mid LAD-2 lesion is 100% stenosed.  2nd Mrg-2 lesion is 99% stenosed.  RPAV  lesion is 99% stenosed.  Mid RCA lesion is 30% stenosed.  Dist RCA lesion is 30% stenosed.  Previously placed Prox RCA drug eluting stent is widely patent.  The left ventricular systolic function is normal.  LV end diastolic pressure is mildly elevated.  The left ventricular ejection fraction is 55-65% by visual estimate.  Previously placed Dist Cx stent (unknown type) is widely patent.  Mid Cx to Dist Cx lesion is 30% stenosed.  Post intervention, there is a 0% residual stenosis.  A drug-eluting stent was successfully placed using a SYNERGY XD 2.50X12.  2nd Mrg-1 lesion is 90% stenosed.  Post intervention, there is a 0% residual stenosis.  A drug-eluting stent was successfully placed using a SYNERGY XD 2.75X24.  Lat 2nd Mrg lesion is 50% stenosed.  1.  Significant underlying three-vessel coronary artery disease with patent LIMA to LAD and patent SVG to diagonal.  Chronically occluded SVG to OM 2 and SVG to right PDA.  Patent RCA stents with mild in-stent restenosis in the mid and distal stents.  Patent distal left circumflex stent with minimal restenosis.  Severe in-stent restenosis in the proximal as well as distal stent in a large OM2. 2.  Normal LV systolic function mildly elevated left ventricular end-diastolic pressure. 3.  Successful OCT guided PCI and drug-eluting stent placement to the distal and proximal OM2.  The distal stent did not cover the whole length of the previously placed stent as I was concerned about small vessel size distally and in order not to jail the inferior branch which was diseased.  I elected to restent with different platform given the quick restenosis after recent cath and balloon angioplasty.  Recommendations: Continue dual antiplatelet therapy indefinitely if possible. Aggressive treatment of risk factors. Hydrate overnight given underlying chronic kidney disease.  Findings Coronary Findings Diagnostic  Dominance: Right  Left Anterior  Descending Prox LAD lesion is 25% stenosed. Mid LAD-1 lesion is 80% stenosed. Mid LAD-2 lesion is 100% stenosed.  Left Circumflex Mid Cx to Dist Cx lesion is 30% stenosed. Previously placed Dist Cx stent (unknown type) is widely patent.  Second Obtuse Marginal Branch Vessel is large in size. 2nd Mrg-1 lesion is 90% stenosed. The lesion is type C. The lesion was previously treated using a drug eluting stent between 6-12 months ago. Optical coherence tomography (OCT) was performed. 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 2nd Mrg-2 lesion is 99% stenosed. The lesion was previously treated using a drug eluting stent between 6-12 months ago. Previously placed stent displays restenosis. Optical coherence tomography (OCT) was performed. OCT images were suboptimal due to distal location of small vessel size.  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  It also showed that the restenosis extended few millimeters beyond the proximal edge of the stent.  Lateral Second Obtuse Marginal Branch Lat 2nd Mrg lesion is 50% stenosed.  Right Coronary Artery Previously placed Prox RCA drug eluting stent is widely patent. Mid RCA lesion is 30% stenosed. The lesion was previously treated. Dist RCA lesion is 30% stenosed. The  lesion was previously treated.  Right Posterior Atrioventricular Artery RPAV lesion is 99% stenosed.  First Right Posterolateral Branch Collaterals 1st RPL filled by collaterals from Dist LAD.  Third Right Posterolateral Branch  LIMA LIMA Graft To Dist LAD LIMA and is normal in caliber. The graft exhibits no disease.  Saphenous Graft To 2nd Diag SVG and is normal in caliber. The graft exhibits no disease.  Saphenous Graft To Dist RCA SVG. Origin to Prox Graft lesion is 100% stenosed.  Graft To 2nd Mrg Origin to Prox Graft lesion is 100% stenosed.  Intervention  2nd Mrg-1 lesion Stent Lesion length:  20 mm. Pre-stent angioplasty was  performed using a BALLOON SAPPHIRE 2.0X12. Maximum pressure:  12 atm. Inflation time:  20 sec. A drug-eluting stent was successfully placed using a SYNERGY XD 2.75X24. Maximum pressure: 14 atm. Inflation time: 20 sec. Post-stent angioplasty was performed using a BALLOON SAPPHIRE [place] 3.0X15. Maximum pressure:  14 atm. Inflation time:  20 sec. Post-Intervention Lesion Assessment The intervention was successful. Pre-interventional TIMI flow is 3. Post-intervention TIMI flow is 3. No complications occurred at this lesion. There is a 0% residual stenosis post intervention.  2nd Mrg-2 lesion Stent Lesion length:  12 mm. CATH VISTA GUIDE 6FR XB3.5 guide catheter was inserted. Lesion crossed with guidewire using a WIRE RUNTHROUGH .[id number]. Pre-stent angioplasty was performed using a BALLOON WOLVERINE 2.25X10. Maximum pressure:  10 atm. Inflation time:  30 sec. A drug-eluting stent was successfully placed using a SYNERGY XD 2.50X12. Maximum pressure: 12 atm. Inflation time: 20 sec. The stent extended proximal to the previously placed stent.  I elected not to stent distally given small vessel size and the concern about jailing the inferior branch.  The balloon was advanced to the overlap area and inflated again to 12 atm. Post-Intervention Lesion Assessment The intervention was successful. Pre-interventional TIMI flow is 3. Post-intervention TIMI flow is 3. No complications occurred at this lesion. There is a 0% residual stenosis post intervention.   CARDIAC CATHETERIZATION  CARDIAC CATHETERIZATION 02/09/2021  Narrative  Mid LAD-1 lesion is 80% stenosed.  Prox LAD lesion is 25% stenosed.  Mid LAD-2 lesion is 100% stenosed.  Previously placed Dist Cx drug eluting stent is widely patent.  RPAV lesion is 99% stenosed.  Mid RCA lesion is 30% stenosed.  Dist RCA lesion is 30% stenosed.  Prox RCA lesion is 80% stenosed.  A drug-eluting stent was successfully placed using a STENT RESOLUTE ONYX  3.0X12.  Post intervention, there is a 0% residual stenosis.  LIMA and is normal in caliber.  The graft exhibits no disease.  SVG and is normal in caliber.  The graft exhibits no disease.  SVG.  Origin lesion is 100% stenosed.  SVG.  Origin to Prox Graft lesion is 100% stenosed.  2nd Mrg-1 lesion is 90% stenosed.  Post intervention, there is a 0% residual stenosis.  Scoring balloon angioplasty was performed using a BALLOON WOLVERINE 2.50X10.  2nd Mrg-2 lesion is 90% stenosed.  Scoring balloon angioplasty was performed using a BALLOON WOLVERINE 2.50X10.  Post intervention, there is a 35% residual stenosis.  The left ventricular systolic function is normal.  LV end diastolic pressure is normal.  The left ventricular ejection fraction is 55-65% by visual estimate.  1. 3 vessel obstructive CAD 2. Patent LIMA to the LAD 3. Patent SVG to the diagonal. 4. Known occlusion of SVG to OM2 and SVG to PDA 5. Restenosis of the stents in the proximal and distal OM2 6. Patent stent in the  distal LCx. 7. Normal LV function 8. Normal LVEDP 9. Successful scoring balloon angioplasty of the stents in the proximal and distal OM2 10 Successful PCI of the proximal RCA with DES x 1.  Plan: DAPT indefinitely. Continue aggressive medical therapy. Patient is a candidate for same day DC.  Findings Coronary Findings Diagnostic  Dominance: Right  Left Anterior Descending Prox LAD lesion is 25% stenosed. Mid LAD-1 lesion is 80% stenosed. Mid LAD-2 lesion is 100% stenosed.  Left Circumflex Previously placed Dist Cx drug eluting stent is widely patent.  Second Obtuse Marginal Branch 2nd Mrg-1 lesion is 90% stenosed. The lesion was previously treated using a drug eluting stent between 6-12 months ago. 2nd Mrg-2 lesion is 90% stenosed. The lesion was previously treated using a drug eluting stent between 6-12 months ago.  Right Coronary Artery Prox RCA lesion is 80% stenosed. Mid RCA  lesion is 30% stenosed. The lesion was previously treated. Dist RCA lesion is 30% stenosed. The lesion was previously treated.  Right Posterior Atrioventricular Artery RPAV lesion is 99% stenosed.  First Right Posterolateral Branch Collaterals 1st RPL filled by collaterals from Dist LAD.  Third Right Posterolateral Branch Collaterals 3rd RPL filled by collaterals from Dist Cx.  LIMA LIMA Graft To Dist LAD LIMA and is normal in caliber. The graft exhibits no disease.  Saphenous Graft To 2nd Diag SVG and is normal in caliber. The graft exhibits no disease.  Saphenous Graft To 2nd Mrg SVG. Origin lesion is 100% stenosed.  Saphenous Graft To Dist RCA SVG. Origin to Prox Graft lesion is 100% stenosed.  Intervention  2nd Mrg-1 lesion Angioplasty CATH LAUNCHER 48FR EBU3.5 guide catheter was inserted. WIRE ASAHI PROWATER 180CM guidewire used to cross lesion. Scoring balloon angioplasty was performed using a BALLOON WOLVERINE 2.50X10. Maximum pressure: 8 atm. Inflation time: 60 sec. Post-Intervention Lesion Assessment The intervention was successful. Pre-interventional TIMI flow is 3. Post-intervention TIMI flow is 3. No complications occurred at this lesion. There is a 0% residual stenosis post intervention.  2nd Mrg-2 lesion Angioplasty CATH LAUNCHER 48FR EBU3.5 guide catheter was inserted. WIRE ASAHI PROWATER 180CM guidewire used to cross lesion. Scoring balloon angioplasty was performed using a BALLOON WOLVERINE 2.50X10. Maximum pressure: 6 atm. Inflation time: 60 sec. Post-Intervention Lesion Assessment The intervention was successful. Pre-interventional TIMI flow is 3. Post-intervention TIMI flow is 3. No complications occurred at this lesion. There is a 35% residual stenosis post intervention.  Prox RCA lesion Stent GUIDE CATH VISTA JR4 48F guide catheter was inserted. Lesion crossed with guidewire using a WIRE ASAHI PROWATER 180CM. Pre-stent angioplasty was performed using a  BALLOON WOLVERINE 2.50X10. Maximum pressure:  6 atm. A drug-eluting stent was successfully placed using a STENT RESOLUTE ONYX 3.0X12. Stent strut is well apposed. Post-stent angioplasty was performed using a BALLOON SAPPHIRE [place] 3.25X8. Maximum pressure:  16 atm. Post-Intervention Lesion Assessment The intervention was successful. Pre-interventional TIMI flow is 3. Post-intervention TIMI flow is 3. No complications occurred at this lesion. There is a 0% residual stenosis post intervention.   STRESS TESTS  MYOCARDIAL PERFUSION IMAGING 11/26/2019  Narrative  The left ventricular ejection fraction is moderately decreased (30-44%).  Nuclear stress EF: 34%.  There was no ST segment deviation noted during stress.  Defect 1: There is a medium defect of severe severity present in the basal inferior, mid inferior and apical inferior location.  Findings consistent with prior myocardial infarction with peri-infarct ischemia.  This is a high risk study.  Abnormal, high risk stress nuclear study with  prior inferior infarct and mild peri-infarct ischemia.  Gated ejection fraction 34% with global hypokinesis and inferior akinesis.  Moderate left ventricular enlargement.  Study interpreted as high risk due to reduced LV function.   ECHOCARDIOGRAM  ECHOCARDIOGRAM COMPLETE 12/18/2019  Narrative ECHOCARDIOGRAM REPORT    Patient Name:   [name] [facility] Date of Exam: 12/18/2019 Medical Rec #:  [id number]       Height:       70.0 in Accession #:    [id number]      Weight:       158.0 lb Date of Birth:  08/23/1946      BSA:          1.89 m Patient Age:    73 years        BP:           160/78 mmHg Patient Gender: M               HR:           68 bpm. Exam Location:  Outpatient  Procedure: 2D Echo, Cardiac Doppler and Color Doppler  Indications:    CAD  History:        Patient has prior history of Echocardiogram examinations, most recent 04/08/2016. CAD, Arrythmias:Atrial Fibrillation;  Risk Factors:Hypertension, Diabetes and Dyslipidemia.  Sonographer:    Lavenia Atlas Referring Phys: [id number] PETER VAN TRIGT  IMPRESSIONS   1. Left ventricular ejection fraction, by visual estimation, is 55 to 60%. The left ventricle has normal function. There is no left ventricular hypertrophy. 2. Left ventricular diastolic parameters are indeterminate. 3. The left ventricle has no regional wall motion abnormalities. 4. Global right ventricle has normal systolic function.The right ventricular size is normal. No increase in right ventricular wall thickness. 5. Left atrial size was mildly dilated. 6. Right atrial size was mildly dilated. 7. The mitral valve is normal in structure. No evidence of mitral valve regurgitation. No evidence of mitral stenosis. 8. The tricuspid valve is normal in structure. 9. The aortic valve is normal in structure. Aortic valve regurgitation is not visualized. No evidence of aortic valve sclerosis or stenosis. 10. The pulmonic valve was normal in structure. Pulmonic valve regurgitation is not visualized. 11. The atrial septum is grossly normal.  FINDINGS Left Ventricle: Left ventricular ejection fraction, by visual estimation, is 55 to 60%. The left ventricle has normal function. The left ventricle has no regional wall motion abnormalities. There is no left ventricular hypertrophy. Left ventricular diastolic parameters are indeterminate.  Right Ventricle: The right ventricular size is normal. No increase in right ventricular wall thickness. Global RV systolic function is has normal systolic function.  Left Atrium: Left atrial size was mildly dilated.  Right Atrium: Right atrial size was mildly dilated  Pericardium: There is no evidence of pericardial effusion.  Mitral Valve: The mitral valve is normal in structure. No evidence of mitral valve regurgitation. No evidence of mitral valve stenosis by observation.  Tricuspid Valve: The tricuspid valve is  normal in structure. Tricuspid valve regurgitation is not demonstrated.  Aortic Valve: The aortic valve is normal in structure. Aortic valve regurgitation is not visualized. The aortic valve is structurally normal, with no evidence of sclerosis or stenosis.  Pulmonic Valve: The pulmonic valve was normal in structure. Pulmonic valve regurgitation is not visualized. Pulmonic regurgitation is not visualized.  Aorta: The aortic root and ascending aorta are structurally normal, with no evidence of dilitation.  IAS/Shunts: The atrial septum is grossly normal.   LEFT VENTRICLE  PLAX 2D LVIDd:         4.60 cm  Diastology LVIDs:         3.50 cm  LV e' lateral:   9.79 cm/s LV PW:         1.10 cm  LV E/e' lateral: 8.7 LV IVS:        1.20 cm  LV e' medial:    6.85 cm/s LVOT diam:     2.00 cm  LV E/e' medial:  12.5 LV SV:         46 ml LV SV Index:   24.69 LVOT Area:     3.14 cm   RIGHT VENTRICLE RV Basal diam:  2.70 cm RV S prime:     18.70 cm/s TAPSE (M-mode): 2.0 cm  LEFT ATRIUM             Index       RIGHT ATRIUM           Index LA diam:        4.00 cm 2.12 cm/m  RA Area:     19.50 cm LA Vol (A2C):   77.3 ml 40.94 ml/m RA Volume:   56.90 ml  30.13 ml/m LA Vol (A4C):   53.4 ml 28.28 ml/m LA Biplane Vol: 68.7 ml 36.38 ml/m AORTIC VALVE LVOT Vmax:   91.40 cm/s LVOT Vmean:  59.700 cm/s LVOT VTI:    0.211 m  AORTA Ao Root diam: 2.90 cm  MITRAL VALVE MV Area (PHT): 2.91 cm             SHUNTS MV PHT:        75.69 msec           Systemic VTI:  0.21 m MV Decel Time: 261 msec             Systemic Diam: 2.00 cm MV E velocity: 85.30 cm/s 103 cm/s MV A velocity: 62.60 cm/s 70.3 cm/s MV E/A ratio:  1.36       1.5   Kristeen Miss MD Electronically signed by Kristeen Miss MD Signature Date/Time: 12/18/2019/4:21:02 PM    Final    MONITORS  CARDIAC EVENT MONITOR 04/14/2016  Narrative  NSR  PACs  Paroxysmal atrial fibrillation. Variable ventricular response.        ______________________________________________________________________________________________      Risk Assessment/Calculations:             Physical Exam:   VS:  BP 130/70   Pulse 68   Ht 5\' 6"  (1.676 m)   Wt 154 lb (69.9 kg)   BMI 24.86 kg/m    Wt Readings from Last 3 Encounters:  02/26/24 154 lb (69.9 kg)  01/12/24 159 lb (72.1 kg)  07/11/23 155 lb (70.3 kg)    GEN: Well nourished, well developed in no acute distress NECK: No JVD; No carotid bruits CARDIAC: Irregularly irregular, no murmurs, rubs, gallops RESPIRATORY:  Clear to auscultation without rales, wheezing or rhonchi  ABDOMEN: Soft, non-tender, non-distended EXTREMITIES:  No edema; No deformity   ASSESSMENT AND PLAN: .    Paroxysmal atrial fibrillation History of PAF in the setting of CABG, no recurrence until recently.  He is now back in atrial fibrillation, rate controlled.   - Stop aspirin. - Start Eliquis 5 mg twice a day. - Continue Plavix. - Order echocardiogram. - Reassess in 4-6 weeks for cardioversion discussion.  Lower Extremity Edema Edema improved with Lasix, now trace in ankles.  PCP recently discontinued amlodipine - Continue Lasix 20  mg daily. - Order basic metabolic panel and CBC this Friday.  CAD s/p CABG Lifelong antiplatelet therapy recommended. Transition to Eliquis for atrial fibrillation management. - Continue Plavix. - Stop aspirin and start Eliquis as per atrial fibrillation management.  Hypertension: Blood pressure well-controlled  Hyperlipidemia: On Repatha  Diabetes Mellitus Type 2 (DMT2): Followed by primary care provider        Dispo: Follow-up in 4 to 6 weeks  Signed, Azalee Course, [place]

## 2024-02-26 ENCOUNTER — Encounter: Payer: Self-pay | Admitting: Physician Assistant

## 2024-02-26 ENCOUNTER — Ambulatory Visit: Attending: Physician Assistant | Admitting: Physician Assistant

## 2024-02-26 VITALS — BP 130/70 | HR 68 | Ht 66.0 in | Wt 154.0 lb

## 2024-02-26 DIAGNOSIS — R600 Localized edema: Secondary | ICD-10-CM | POA: Diagnosis not present

## 2024-02-26 DIAGNOSIS — I25810 Atherosclerosis of coronary artery bypass graft(s) without angina pectoris: Secondary | ICD-10-CM

## 2024-02-26 DIAGNOSIS — I480 Paroxysmal atrial fibrillation: Secondary | ICD-10-CM | POA: Diagnosis not present

## 2024-02-26 DIAGNOSIS — I10 Essential (primary) hypertension: Secondary | ICD-10-CM | POA: Diagnosis not present

## 2024-02-26 DIAGNOSIS — E119 Type 2 diabetes mellitus without complications: Secondary | ICD-10-CM

## 2024-02-26 DIAGNOSIS — E785 Hyperlipidemia, unspecified: Secondary | ICD-10-CM

## 2024-02-26 DIAGNOSIS — I25708 Atherosclerosis of coronary artery bypass graft(s), unspecified, with other forms of angina pectoris: Secondary | ICD-10-CM

## 2024-02-26 DIAGNOSIS — Z79899 Other long term (current) drug therapy: Secondary | ICD-10-CM

## 2024-02-26 MED ORDER — APIXABAN 5 MG PO TABS: 5.0000 mg | ORAL_TABLET | Freq: Two times a day (BID) | ORAL | 5 refills | Status: DC

## 2024-02-26 NOTE — Patient Instructions (Signed)
 Medication Instructions:  STOP ASPIRIN   START ELIQUIS 5 MG TWICE DAILY  *If you need a refill on your cardiac medications before your next appointment, please call your pharmacy*   Lab Work: [facility] AND CBC ON FRIDAY 03/01/24 If you have labs (blood work) drawn today and your tests are completely normal, you will receive your results only by: MyChart Message (if you have MyChart) OR A paper copy in the mail If you have any lab test that is abnormal or we need to change your treatment, we will call you to review the results.   Testing/Procedures:[address]- WITHIN NEXT FEW WEEKS Your physician has requested that you have an echocardiogram. Echocardiography is a painless test that uses sound waves to create images of your heart. It provides your doctor with information about the size and shape of your heart and how well your heart's chambers and valves are working. This procedure takes approximately one hour. There are no restrictions for this procedure. Please do NOT wear cologne, perfume, aftershave, or lotions (deodorant is allowed). Please arrive 15 minutes prior to your appointment time.  Please note: We ask at that you not bring children with you during ultrasound (echo/ vascular) testing. Due to room size and safety concerns, children are not allowed in the ultrasound rooms during exams. Our front office staff cannot provide observation of children in our lobby area while testing is being conducted. An adult accompanying a patient to their appointment will only be allowed in the ultrasound room at the discretion of the ultrasound technician under special circumstances. We apologize for any inconvenience.    Follow-Up: At [facility], you and your health needs are our priority.  As part of our continuing mission to provide you with exceptional heart care, we have created designated Provider Care Teams.  These Care Teams include your primary Cardiologist (physician)  and Advanced Practice Providers (APPs -  Physician Assistants and Nurse Practitioners) who all work together to provide you with the care you need, when you need it.  Your next appointment:   4-6 week(s) after echocardiogram  Provider:   Azalee Course, PA     Other Instructions

## 2024-03-02 LAB — BASIC METABOLIC PANEL WITH GFR
BUN/Creatinine Ratio: 15 (ref 10–24)
BUN: 20 mg/dL (ref 8–27)
CO2: 25 mmol/L (ref 20–29)
Calcium: 9.1 mg/dL (ref 8.6–10.2)
Chloride: 103 mmol/L (ref 96–106)
Creatinine, Ser: 1.31 mg/dL — ABNORMAL HIGH (ref 0.76–1.27)
Glucose: 208 mg/dL — ABNORMAL HIGH (ref 70–99)
Potassium: 3.8 mmol/L (ref 3.5–5.2)
Sodium: 144 mmol/L (ref 134–144)
eGFR: 56 mL/min/{1.73_m2} — ABNORMAL LOW (ref >59)

## 2024-03-02 LAB — CBC
Hematocrit: 45.6 % (ref 37.5–51.0)
Hemoglobin: 14.5 g/dL (ref 13.0–17.7)
MCH: 27.4 pg (ref 26.6–33.0)
MCHC: 31.8 g/dL (ref 31.5–35.7)
MCV: 86 fL (ref 79–97)
Platelets: 192 10*3/uL (ref 150–450)
RBC: 5.3 x10E6/uL (ref 4.14–5.80)
RDW: 12.7 % (ref 11.6–15.4)
WBC: 5.1 10*3/uL (ref 3.4–10.8)

## 2024-03-06 ENCOUNTER — Ambulatory Visit: Payer: Self-pay

## 2024-03-06 ENCOUNTER — Institutional Professional Consult (permissible substitution): Payer: BC Managed Care – PPO | Admitting: Psychology

## 2024-03-13 ENCOUNTER — Encounter: Payer: BC Managed Care – PPO | Admitting: Psychology

## 2024-03-14 ENCOUNTER — Telehealth: Payer: Self-pay | Admitting: Cardiology

## 2024-03-14 NOTE — Telephone Encounter (Signed)
 Pt returning call

## 2024-03-14 NOTE — Telephone Encounter (Signed)
 Patient's wife would like to know if patient can wear Dexcom during 4/17 echo. She says they have issues removing it and putting it back on. She says it will be placed on his left arm at the time of echo and she would like to make sure it won't interfere. Please advise.

## 2024-03-14 NOTE — Telephone Encounter (Signed)
 Attempted to call patient, no answer left message requesting a call back.

## 2024-03-14 NOTE — Telephone Encounter (Signed)
 Spoke to patient's wife she wanted to make sure ok to Dexcom blood sugar monitor when he has a echo next week.Advised ok to wear.

## 2024-03-20 ENCOUNTER — Ambulatory Visit
Admission: EM | Admit: 2024-03-20 | Discharge: 2024-03-20 | Disposition: A | Discharge status: Home / Self Care | Attending: Family Medicine | Admitting: Family Medicine

## 2024-03-20 DIAGNOSIS — J40 Bronchitis, not specified as acute or chronic: Secondary | ICD-10-CM | POA: Diagnosis not present

## 2024-03-20 DIAGNOSIS — Z87891 Personal history of nicotine dependence: Secondary | ICD-10-CM | POA: Diagnosis present

## 2024-03-20 DIAGNOSIS — J069 Acute upper respiratory infection, unspecified: Secondary | ICD-10-CM | POA: Diagnosis not present

## 2024-03-20 LAB — RESP PANEL BY RT-PCR (RSV, FLU A&B, COVID)  RVPGX2
Influenza A by PCR: NEGATIVE
Influenza B by PCR: NEGATIVE
Resp Syncytial Virus by PCR: NEGATIVE
SARS Coronavirus 2 by RT PCR: NEGATIVE

## 2024-03-20 MED ORDER — PROMETHAZINE-DM 6.25-15 MG/5ML PO SYRP: 5.0000 mL | ORAL_SOLUTION | Freq: Four times a day (QID) | ORAL | 0 refills | Status: DC | PRN

## 2024-03-20 MED ORDER — PREDNISONE 10 MG (21) PO TBPK: ORAL_TABLET | Freq: Every day | ORAL | 0 refills | Status: DC

## 2024-03-20 MED ORDER — AZITHROMYCIN 250 MG PO TABS: ORAL_TABLET | ORAL | 0 refills | Status: DC

## 2024-03-20 NOTE — ED Provider Notes (Signed)
 [facility]    CSN: [id number] Arrival date & time: 03/20/24  1859      History   Chief Complaint Chief Complaint  Patient presents with   Cough   Nasal Congestion    HPI [name] is a 78 y.o. male.   HPI  History obtained from the patient and wife . [name] presents for worsening respiratory symptoms.  Has cough, rhinorrhea, fatigue that started 4-5 days ago. Wife reports that he took a nap and when he woke up his cough was worse.  She gave him some Mucinex DM and Tylenol. He used an albuterol inhaler. Denies shortness of breath, wheezing and fever,    Former smoker and started smoking when he was younger.        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   Urinary hesitancy     Patient Active Problem List   Diagnosis Date Noted   Frequency of micturition 10/05/2022   Orthostatic lightheadedness 10/05/2022   Renal insufficiency, mild 04/01/2021   BPH (benign prostatic hyperplasia) 04/01/2021   Mild neurocognitive disorder 04/01/2021   History of nonmelanoma skin cancer 03/01/2021   Encounter for post surgical wound check 04/08/2020   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 with VG failure of bypass grafts and stents to OM and LCX  03/18/2020   CAD (coronary artery disease) of bypass graft 03/16/20 03/18/2020   STEMI involving left circumflex coronary artery (HCC) 03/16/2020   Postop check 01/22/2020   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 12/20/2019   Unstable angina (HCC) 12/04/2019   Abnormal nuclear stress test    PAF (paroxysmal atrial fibrillation) (HCC) 04/21/2016   Pre-syncope 04/10/2016   Diabetes (HCC) 04/10/2016   HLD (hyperlipidemia)    Right inguinal hernia 10/18/2011   History of colonic polyps 06/13/2011   Guaiac positive stools 06/13/2011   GERD (gastroesophageal reflux disease) 06/13/2011   ARTHRITIS 03/03/2009   DIABETES MELLITUS 03/02/2009   Essential hypertension 03/02/2009   Coronary atherosclerosis 03/02/2009   HEMORRHOIDS 03/02/2009   ESOPHAGEAL STRICTURE 03/02/2009   GERD 03/02/2009   HIATAL HERNIA 03/02/2009   POLYP, GALLBLADDER 03/02/2009    Past Surgical History:  Procedure Laterality Date   ANKLE ARTHROPLASTY  2006   CARDIOVASCULAR STRESS TEST  01/22/2009   EF 51%, NO ISCHEMIA   CARDIOVASCULAR STRESS TEST     COLONOSCOPY     CORONARY ANGIOPLASTY WITH STENT PLACEMENT  03/2005   CORONARY ARTERY BYPASS GRAFT N/A 12/20/2019   Procedure: CORONARY ARTERY BYPASS GRAFTING (CABG) times four, using left internal mammary artery and left greater saphenous vein;  Surgeon: Kerin Perna, MD;  Location: [facility] OR;  Service: Open Heart Surgery;  Laterality: N/A;   CORONARY BALLOON ANGIOPLASTY N/A 02/09/2021   Procedure: CORONARY BALLOON ANGIOPLASTY;  Surgeon: Swaziland, Peter M, MD;  Location:  [facility];  Service: Cardiovascular;  Laterality: N/A;   CORONARY STENT INTERVENTION N/A 03/16/2020   Procedure: CORONARY STENT INTERVENTION;  Surgeon: Swaziland, Peter M, MD;  Location: [facility] INVASIVE CV LAB;  Service: Cardiovascular;  Laterality: N/A;   CORONARY STENT INTERVENTION N/A 02/09/2021   Procedure: CORONARY STENT INTERVENTION;  Surgeon: Swaziland, Peter M, MD;  Location: [facility] INVASIVE CV LAB;  Service: Cardiovascular;  Laterality: N/A;   CORONARY STENT INTERVENTION N/A 03/31/2021   Procedure: CORONARY STENT INTERVENTION;  Surgeon: Iran Ouch, MD;  Location: [facility];  Service:  Cardiovascular;  Laterality: N/A;   CORONARY STENT PLACEMENT  03/10/2005   SUCCESSFUL STENTING OF THE MID AND CRUX OF THE RCA   CORONARY/GRAFT ACUTE MI REVASCULARIZATION N/A 03/16/2020   Procedure: Coronary/Graft Acute MI Revascularization;  Surgeon: Swaziland, Peter M, MD;  Location: [facility] INVASIVE CV LAB;  Service: Cardiovascular;  Laterality: N/A;   ESOPHAGEAL DILATION     multiple procedures   HERNIA REPAIR  11/30/2005   umbilical hernia   INGUINAL HERNIA REPAIR  11/28/2011   Procedure: HERNIA REPAIR INGUINAL ADULT;  Surgeon: Wilmon Arms. Corliss Skains, MD;  Location: [facility] OR;  Service: General;  Laterality: Right;  right inguinal hernia repair with mesh   KNEE SURGERY     Right   LEFT HEART CATH AND CORONARY ANGIOGRAPHY N/A 12/03/2019   Procedure: LEFT HEART CATH AND CORONARY ANGIOGRAPHY;  Surgeon: Corky Crafts, MD;  Location: [facility] INVASIVE CV LAB;  Service: Cardiovascular;  Laterality: N/A;   LEFT HEART CATH AND CORS/GRAFTS ANGIOGRAPHY N/A 03/16/2020   Procedure: LEFT HEART CATH AND CORS/GRAFTS ANGIOGRAPHY;  Surgeon: Swaziland, Peter M, MD;  Location: [facility] INVASIVE CV LAB;  Service: Cardiovascular;  Laterality: N/A;   LEFT HEART CATH AND CORS/GRAFTS ANGIOGRAPHY N/A 02/09/2021   Procedure: LEFT HEART CATH AND CORS/GRAFTS ANGIOGRAPHY;  Surgeon: Swaziland, Peter M, MD;  Location: [facility] INVASIVE CV LAB;  Service: Cardiovascular;  Laterality: N/A;   LEFT HEART CATH AND CORS/GRAFTS ANGIOGRAPHY N/A 03/31/2021   Procedure: LEFT HEART CATH AND CORS/GRAFTS ANGIOGRAPHY;  Surgeon: Iran Ouch, MD;  Location: [facility];  Service: Cardiovascular;  Laterality: N/A;   POLYPECTOMY     TEE WITHOUT CARDIOVERSION N/A 12/20/2019   Procedure: TRANSESOPHAGEAL ECHOCARDIOGRAM (TEE);  Surgeon: Donata Clay, Theron Arista, MD;  Location: [facility] OR;  Service: Open Heart Surgery;  Laterality: N/A;   TONSILLECTOMY  1958   UMBILICAL HERNIA REPAIR  11/2006   US ECHOCARDIOGRAPHY         Home Medications    Prior to Admission medications    Medication Sig Start Date End Date Taking? Authorizing Provider  azithromycin (ZITHROMAX Z-PAK) 250 MG tablet Take 2 tablets on day 1 then 1 tablet daily 03/20/24  Yes Robbyn Hodkinson, DO  predniSONE (STERAPRED UNI-PAK 21 TAB) 10 MG (21) TBPK tablet Take by mouth daily. Take 6 tabs by mouth daily for 1, then 5 tabs for 1 day, then 4 tabs for 1 day, then 3 tabs for 1 day, then 2 tabs for 1 day, then 1 tab for 1 day. 03/20/24  Yes Britzy Graul, DO  promethazine-dextromethorphan (PROMETHAZINE-DM) 6.25-15 MG/5ML syrup Take 5 mLs by mouth 4 (four) times daily as needed. 03/20/24  Yes Nashae Maudlin, Seward Meth, DO  acetaminophen (TYLENOL) 650 MG CR tablet Take 1,300 mg by mouth every 8 (eight) hours as needed for pain.    [provider]  apixaban (ELIQUIS) 5 MG TABS tablet Take 1 tablet (5 mg total) by mouth 2 (two) times  daily. 02/26/24   Azalee Course, PA  B Complex-C (B-COMPLEX WITH VITAMIN C) tablet Take 1 tablet by mouth daily with breakfast.     [provider]  Calcium-Magnesium-Zinc (CAL-MAG-ZINC PO) Take 1 tablet by mouth daily with lunch.    [provider]  cetirizine (ZYRTEC) 10 MG tablet Take 10 mg by mouth in the morning.    [provider]  clopidogrel (PLAVIX) 75 MG tablet Take 1 tablet (75 mg total) by mouth daily with breakfast. 06/02/23   [place], Peter M, MD  empagliflozin (JARDIANCE) 25 MG TABS tablet Take 25 mg by mouth in the morning. 12/08/20   [place], Peter M, MD  finasteride (PROSCAR) 5 MG tablet Take 5 mg by mouth daily. 12/28/21   [provider]  fluticasone (FLONASE) 50 MCG/ACT nasal spray Place 2 sprays into both nostrils daily as needed for allergies.    [provider]  furosemide (LASIX) 20 MG tablet Take 20 mg by mouth daily.    [provider]  isosorbide mononitrate (IMDUR) 60 MG 24 hr tablet Take 1 tablet (60 mg total) by mouth daily. 06/02/23   [place], Peter M, MD  memantine (NAMENDA) 10 MG tablet Take 1 tablet (10 mg total)  by mouth 2 (two) times daily. 01/12/24   Marcos Eke, PA-C  metoprolol tartrate (LOPRESSOR) 25 MG tablet TAKE 1 TABLET BY MOUTH 2 TIMES DAILY 10/20/23   [place], Peter M, MD  nitroGLYCERIN (NITROSTAT) 0.4 MG SL tablet Place 1 tablet (0.4 mg total) under the tongue every 5 (five) minutes as needed for chest pain. 06/02/23   [place], Peter M, MD  OneTouch Delica Lancets 30G MISC 1 each by Other route daily. 02/14/20   [provider]  [facility] VERIO test strip 1 each by Other route daily. 02/13/20   [provider]  OZEMPIC, 0.25 OR 0.5 MG/DOSE, 2 MG/3ML SOPN Inject into the skin. 09/10/22   [provider]  pantoprazole (PROTONIX) 40 MG tablet Take 1 tablet (40 mg total) by mouth 2 (two) times daily. 07/28/23   [place], Peter M, MD  REPATHA SURECLICK 140 MG/ML SOAJ INJECT ONE (1) PEN INTO THE SKIN EVERY 14 DAYS AS DIRECTED 05/23/23   [place], Peter M, MD  Tamsulosin HCl (FLOMAX) 0.4 MG CAPS Take 0.4 mg by mouth at bedtime.    [provider]    Family History Family History  Problem Relation Age of Onset   Heart disease Mother    Heart disease Sister    Heart disease Brother    Heart disease Maternal Grandfather    Ovarian cancer Paternal Grandmother    Cancer Paternal Grandmother        cervical   Colon cancer Neg Hx    Esophageal cancer Neg Hx    Stomach cancer Neg Hx     Social History Social History   Tobacco Use   Smoking status: Former    Current packs/day: 0.00    Average packs/day: 1 pack/day for 22.0 years (22.0 ttl pk-yrs)    Types: Cigarettes, Pipe    Start date: 12/17/1992    Quit date: 12/17/2014    Years since quitting: 9.2   Smokeless tobacco: Never  Substance Use Topics   Alcohol use: No    Alcohol/week: 0.0 standard drinks of alcohol   Drug use: No     Allergies   Kiwi extract, Statins, Barbiturates, Bismuth subsalicylate, Codeine, Morphine and codeine, Tape, and Altace [ramipril]   Review of Systems Review of Systems:  negative unless otherwise stated in HPI.      Physical Exam Triage Vital Signs ED Triage Vitals  Encounter Vitals Group     BP      Systolic BP Percentile      Diastolic BP Percentile      Pulse      Resp      Temp      Temp src      SpO2      Weight      Height      Head Circumference      Peak Flow      Pain Score      Pain Loc      Pain Education      Exclude from Growth Chart    No data found.  Updated Vital Signs BP 125/65 (BP Location: Left Arm)   Pulse 75   Temp 99.8 F (37.7 C) (Oral)   Resp 18   Ht 5\' 8"  (1.727 m)   Wt 70.3 kg   SpO2 93%   BMI 23.57 kg/m   Visual Acuity Right Eye Distance:   Left Eye Distance:   Bilateral Distance:    Right Eye Near:   Left Eye Near:    Bilateral Near:     Physical Exam GEN:     alert, ill but non-toxic appearing male in no distress, hard of hearing    HENT:  mucus membranes moist, oropharyngeal without lesions or erythema, clear nasal discharge EYES:   no scleral injection or discharge   RESP:  no increased work of breathing, rhonchi bilaterally, slight expiratory wheeze CVS:   regular rate and irregularly irregular rhythm Skin:   warm and dry    UC Treatments / Results  Labs (all labs ordered are listed, but only abnormal results are displayed) Labs Reviewed  RESP PANEL BY RT-PCR (RSV, FLU A&B, COVID)  [id number]    EKG   Radiology No results found.  Procedures Procedures (including critical care time)  Medications Ordered in UC Medications - No data to display  Initial Impression / Assessment and Plan / UC Course  I have reviewed the triage vital signs and the nursing notes.  Pertinent labs & imaging results that were available during my care of the patient were reviewed by me and considered in my medical decision making (see chart for details).       Pt is a 78 y.o. male who is a former smoker presents for 5 days of respiratory symptoms. Ezrah has  an elevated temperature here at 99.33F.   Satting 93% on room air whereas he recent outpatient baseline has been 93-95%. Overall pt is ill but non-toxic appearing, well hydrated, without respiratory distress. Pulmonary exam is remarkable for faint expiratory wheezing and rhonchi with cough.  COVID, RSV and influenza panel obtained and was negative.  Given his elevated temperatures suspect that he may have bronchitis.  Treat with steroids and antibiotics as below.  Promethazine DM for cough.. Discussed symptomatic treatment.  Typical duration of symptoms discussed.   Return and ED precautions given and voiced understanding. Discussed MDM, treatment plan and plan for follow-up with patient and his wife who agrees with plan.     Final Clinical Impressions(s) / UC Diagnoses   Final diagnoses:  Bronchitis  URI with cough and congestion  Former cigarette smoker     Discharge Instructions      Imre A Klemp's  RSV, influenza and COVID are all negative. I suspect he has bronchitis.  Stop by the pharmacy to pick up your prescriptions.  Follow up with your primary care provider or return to the urgent care, if not improving.   You can take Tylenol as needed for fever reduction and pain relief.    For cough: top at the pharmacy to pick up your prescription cough medication. You can use a humidifier for chest congestion and cough.  If you don't have a humidifier, you can sit in the bathroom with the hot shower running.      For congestion: take a daily anti-histamine like Zyrtec, Claritin. You can also use Flonase 1-2 sprays in each nostril daily.    It is important to stay hydrated: drink plenty of fluids (water, gatorade/powerade/pedialyte, juices, or teas) to keep your throat moisturized and help further relieve irritation/discomfort.    Return or go to the Emergency Department if symptoms worsen or do not improve in the next few days      ED Prescriptions     Medication Sig Dispense Auth. Provider   predniSONE (STERAPRED  UNI-PAK 21 TAB) 10 MG (21) TBPK tablet Take by mouth daily. Take 6 tabs by mouth daily for 1, then 5 tabs for 1 day, then 4 tabs for 1 day, then 3 tabs for 1 day, then 2 tabs for 1 day, then 1 tab for 1 day. 21 tablet Anet Logsdon, DO   azithromycin (ZITHROMAX Z-PAK) 250 MG tablet Take 2 tablets on day 1 then 1 tablet daily 6 tablet Alexxis Mackert, DO   promethazine-dextromethorphan (PROMETHAZINE-DM) 6.25-15 MG/5ML syrup Take 5 mLs by mouth 4 (four) times daily as needed. 118 mL Zanovia Rotz, DO      PDMP not reviewed this encounter.   Shayleen Eppinger, DO 03/20/24 2006

## 2024-03-20 NOTE — ED Triage Notes (Signed)
 Pt c/o cough,fatigue & runny nose x5 days. States sx's worsened today. Denies any fevers,sob or wheezing. Hx of Afib.

## 2024-03-20 NOTE — Discharge Instructions (Addendum)
 Lenell A Granillo's  RSV, influenza and COVID are all negative. I suspect he has bronchitis. Stop by the pharmacy to pick up your prescriptions.  Follow up with your primary care provider or return to the urgent care, if not improving.   You can take Tylenol as needed for fever reduction and pain relief.    For cough: top at the pharmacy to pick up your prescription cough medication. You can use a humidifier for chest congestion and cough.  If you don't have a humidifier, you can sit in the bathroom with the hot shower running.      For congestion: take a daily anti-histamine like Zyrtec, Claritin. You can also use Flonase 1-2 sprays in each nostril daily.    It is important to stay hydrated: drink plenty of fluids (water, gatorade/powerade/pedialyte, juices, or teas) to keep your throat moisturized and help further relieve irritation/discomfort.    Return or go to the Emergency Department if symptoms worsen or do not improve in the next few days

## 2024-03-21 ENCOUNTER — Ambulatory Visit

## 2024-03-26 ENCOUNTER — Ambulatory Visit (HOSPITAL_COMMUNITY): Attending: Physician Assistant

## 2024-03-26 DIAGNOSIS — I25810 Atherosclerosis of coronary artery bypass graft(s) without angina pectoris: Secondary | ICD-10-CM | POA: Insufficient documentation

## 2024-03-26 LAB — ECHOCARDIOGRAM COMPLETE
Area-P 1/2: 3.05 cm2
S' Lateral: 3.4 cm

## 2024-04-05 ENCOUNTER — Ambulatory Visit: Attending: Physician Assistant | Admitting: Physician Assistant

## 2024-04-05 ENCOUNTER — Encounter: Payer: Self-pay | Admitting: Physician Assistant

## 2024-04-05 VITALS — BP 108/56 | HR 53 | Ht 68.0 in | Wt 150.2 lb

## 2024-04-05 DIAGNOSIS — I4819 Other persistent atrial fibrillation: Secondary | ICD-10-CM | POA: Diagnosis not present

## 2024-04-05 DIAGNOSIS — I25708 Atherosclerosis of coronary artery bypass graft(s), unspecified, with other forms of angina pectoris: Secondary | ICD-10-CM | POA: Diagnosis not present

## 2024-04-05 DIAGNOSIS — I200 Unstable angina: Secondary | ICD-10-CM

## 2024-04-05 DIAGNOSIS — E785 Hyperlipidemia, unspecified: Secondary | ICD-10-CM | POA: Diagnosis not present

## 2024-04-05 DIAGNOSIS — I10 Essential (primary) hypertension: Secondary | ICD-10-CM | POA: Diagnosis not present

## 2024-04-05 DIAGNOSIS — E119 Type 2 diabetes mellitus without complications: Secondary | ICD-10-CM

## 2024-04-05 NOTE — Patient Instructions (Signed)
 Medication Instructions:  NO CHANGES *If you need a refill on your cardiac medications before your next appointment, please call your pharmacy*  Lab Work: NO LABS If you have labs (blood work) drawn today and your tests are completely normal, you will receive your results only by: MyChart Message (if you have MyChart) OR A paper copy in the mail If you have any lab test that is abnormal or we need to change your treatment, we will call you to review the results.  Testing/Procedures: NO TESTING  Follow-Up: At [facility], you and your health needs are our priority.  As part of our continuing mission to provide you with exceptional heart care, our providers are all part of one team.  This team includes your primary Cardiologist (physician) and Advanced Practice Providers or APPs (Physician Assistants and Nurse Practitioners) who all work together to provide you with the care you need, when you need it.  Your next appointment:   KEEP FOLLOW UP JUNE 2025  Provider:   Peter Swaziland, MD

## 2024-04-05 NOTE — Progress Notes (Unsigned)
 Cardiology Office Note:  .   Date:  04/07/2024  ID:  [name], DOB 07-18-1946, MRN [id number] PCP: Jeannine Milroy., MD  [facility] Providers Cardiologist:  Peter Swaziland, MD     History of Present Illness: .   [name] is a 78 y.o. 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  He had NSTEMI in April 2021 that required stenting of OM2 and distal left circumflex artery, SVG to OM and PDA was occluded.  He had 70% proximal RCA lesion and diffusely diseased PLA branch that was filled with left-to-right collaterals, this was managed medically.  Postprocedure, he was placed on aspirin  and Plavix .  Anticoagulation therapy and amiodarone  were discontinued as his A-fib only occurred after bypass surgery.  He was placed on Praluent  for cholesterol control.  He had another cardiac catheterization in March 2022 which revealed restenosis of the stent, this was treated with scoring balloon angioplasty, proximal RCA was also stented, LAD and the distal left circumflex artery stent were patent.  He had recurrent chest pain and was hospitalized in April 2022.  Troponin was borderline elevated.  He underwent another cardiac catheterization on 03/31/2021 which showed 80% mid LAD lesion, 100% mid LAD occlusion, 99% OM2 lesion treated with DES x 2, EF 55 to 65%, widely patent distal left circumflex stent.  It was recommended he continue dual antiplatelet therapy indefinitely.  Imdur  was increased to 60 mg daily.  Beta-blocker was reduced due to bradycardia.   I last saw the patient on 02/26/2024, EKG at the time showed he had went back into atrial fibrillation with a controlled heart rate of 69 bpm.  He had no cardiac awareness.  He denies any recent chest pain or shortness of breath.  His PCP had noted some lower extremity edema and started on 20 mg daily of Lasix .  He had significant urine output  after only 3 days of diuretic and the by the time I examined him, his lower extremity edema has largely resolved.  Renal function and the electrolyte were stable.  The red blood cell count normal.  His aspirin  was discontinued, he was started on Eliquis  5 mg twice a day.  We continued his Plavix  given previous recommendation of continuing antiplatelet for life.  Subsequent echocardiogram obtained on 03/26/2024 showed EF 55 to 60%, mild MR, mild to moderate TR, trivial AI.  No significant change when compared to the previous study.  Moderately dilated biatrial size.   Patient presents today accompanied by daughter.  We have reviewed the recent echocardiogram.  Talking with the patient, he is not confident that he is truly symptomatic with A-fib and does not understand why he need a cardioversion.  Talking with the daughter however she mentions patient has complained about feeling bad and more fatigued in the recent month.  He denies any shortness of breath or palpitation.  However was complicating the issue is patient has very bad dementia and he does not often remember how he felt in the past few days.  Eventually, we decided to hold off on cardioversion for now and have patient discuss the cardioversion with family member.  If he is truly asymptomatic, I am not sure if cardioversion will bring him any benefit.  ROS:   He denies chest pain, palpitations, dyspnea, pnd, orthopnea, n, v, dizziness, syncope, edema, weight gain, or early satiety. All other systems reviewed and are otherwise negative except  as noted above.    Studies Reviewed: Aaron Aas   EKG Interpretation Date/Time:  Friday Apr 05 2024 12:01:26 EDT Ventricular Rate:  62 PR Interval:    QRS Duration:  118 QT Interval:  464 QTC Calculation: 470 R Axis:   -62  Text Interpretation: Atrial fibrillation T wave inversion in the inferior leads When compared with ECG of 26-Feb-2024 09:05, Questionable change in QRS axis Borderline criteria for Inferior  infarct are now Present Serial changes of Anterior infarct Present Confirmed by Ervin Heath [phone]) on 04/07/2024 7:10:38 PM    Cardiac Studies & Procedures   ______________________________________________________________________________________________ CARDIAC CATHETERIZATION  CARDIAC CATHETERIZATION 03/31/2021  Conclusion  Mid LAD-1 lesion is 80% stenosed.  Prox LAD lesion is 25% stenosed.  Mid LAD-2 lesion is 100% stenosed.  2nd Mrg-2 lesion is 99% stenosed.  RPAV lesion is 99% stenosed.  Mid RCA lesion is 30% stenosed.  Dist RCA lesion is 30% stenosed.  Previously placed Prox RCA drug eluting stent is widely patent.  The left ventricular systolic function is normal.  LV end diastolic pressure is mildly elevated.  The left ventricular ejection fraction is 55-65% by visual estimate.  Previously placed Dist Cx stent (unknown type) is widely patent.  Mid Cx to Dist Cx lesion is 30% stenosed.  Post intervention, there is a 0% residual stenosis.  A drug-eluting stent was successfully placed using a SYNERGY XD 2.50X12.  2nd Mrg-1 lesion is 90% stenosed.  Post intervention, there is a 0% residual stenosis.  A drug-eluting stent was successfully placed using a SYNERGY XD 2.75X24.  Lat 2nd Mrg lesion is 50% stenosed.  1.  Significant underlying three-vessel coronary artery disease with patent LIMA to LAD and patent SVG to diagonal.  Chronically occluded SVG to OM 2 and SVG to right PDA.  Patent RCA stents with mild in-stent restenosis in the mid and distal stents.  Patent distal left circumflex stent with minimal restenosis.  Severe in-stent restenosis in the proximal as well as distal stent in a large OM2. 2.  Normal LV systolic function mildly elevated left ventricular end-diastolic pressure. 3.  Successful OCT guided PCI and drug-eluting stent placement to the distal and proximal OM2.  The distal stent did not cover the whole length of the previously placed stent as I was  concerned about small vessel size distally and in order not to jail the inferior branch which was diseased.  I elected to restent with different platform given the quick restenosis after recent cath and balloon angioplasty.  Recommendations: Continue dual antiplatelet therapy indefinitely if possible. Aggressive treatment of risk factors. Hydrate overnight given underlying chronic kidney disease.  Findings Coronary Findings Diagnostic  Dominance: Right  Left Anterior Descending Prox LAD lesion is 25% stenosed. Mid LAD-1 lesion is 80% stenosed. Mid LAD-2 lesion is 100% stenosed.  Left Circumflex Mid Cx to Dist Cx lesion is 30% stenosed. Previously placed Dist Cx stent (unknown type) is widely patent.  Second Obtuse Marginal Branch Vessel is large in size. 2nd Mrg-1 lesion is 90% stenosed. The lesion is type C. The lesion was previously treated using a drug eluting stent between 6-12 months ago. Optical coherence tomography (OCT) was performed. 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 2nd Mrg-2 lesion is 99% stenosed. The lesion was previously treated using a drug eluting stent between 6-12 months ago. Previously placed stent displays restenosis. Optical coherence tomography (OCT) was performed. OCT images were suboptimal due to distal location of small vessel size.  It  did show a severe neointimal hyperplasia.  It also showed that the restenosis extended few millimeters beyond the proximal edge of the stent.  Lateral Second Obtuse Marginal Branch Lat 2nd Mrg lesion is 50% stenosed.  Right Coronary Artery Previously placed Prox RCA drug eluting stent is widely patent. Mid RCA lesion is 30% stenosed. The lesion was previously treated. Dist RCA lesion is 30% stenosed. The lesion was previously treated.  Right Posterior Atrioventricular Artery RPAV lesion is 99% stenosed.  First Right Posterolateral Branch Collaterals 1st RPL filled by collaterals  from Dist LAD.  Third Right Posterolateral Branch  LIMA LIMA Graft To Dist LAD LIMA and is normal in caliber. The graft exhibits no disease.  Saphenous Graft To 2nd Diag SVG and is normal in caliber. The graft exhibits no disease.  Saphenous Graft To Dist RCA SVG. Origin to Prox Graft lesion is 100% stenosed.  Graft To 2nd Mrg Origin to Prox Graft lesion is 100% stenosed.  Intervention  2nd Mrg-1 lesion Stent Lesion length:  20 mm. Pre-stent angioplasty was performed using a BALLOON SAPPHIRE 2.0X12. Maximum pressure:  12 atm. Inflation time:  20 sec. A drug-eluting stent was successfully placed using a SYNERGY XD 2.75X24. Maximum pressure: 14 atm. Inflation time: 20 sec. Post-stent angioplasty was performed using a BALLOON SAPPHIRE [place] 3.0X15. Maximum pressure:  14 atm. Inflation time:  20 sec. Post-Intervention Lesion Assessment The intervention was successful. Pre-interventional TIMI flow is 3. Post-intervention TIMI flow is 3. No complications occurred at this lesion. There is a 0% residual stenosis post intervention.  2nd Mrg-2 lesion Stent Lesion length:  12 mm. CATH VISTA GUIDE 6FR XB3.5 guide catheter was inserted. Lesion crossed with guidewire using a WIRE RUNTHROUGH .[id number]. Pre-stent angioplasty was performed using a BALLOON WOLVERINE 2.25X10. Maximum pressure:  10 atm. Inflation time:  30 sec. A drug-eluting stent was successfully placed using a SYNERGY XD 2.50X12. Maximum pressure: 12 atm. Inflation time: 20 sec. The stent extended proximal to the previously placed stent.  I elected not to stent distally given small vessel size and the concern about jailing the inferior branch.  The balloon was advanced to the overlap area and inflated again to 12 atm. Post-Intervention Lesion Assessment The intervention was successful. Pre-interventional TIMI flow is 3. Post-intervention TIMI flow is 3. No complications occurred at this lesion. There is a 0% residual stenosis post  intervention.   CARDIAC CATHETERIZATION  CARDIAC CATHETERIZATION 02/09/2021  Conclusion  Mid LAD-1 lesion is 80% stenosed.  Prox LAD lesion is 25% stenosed.  Mid LAD-2 lesion is 100% stenosed.  Previously placed Dist Cx drug eluting stent is widely patent.  RPAV lesion is 99% stenosed.  Mid RCA lesion is 30% stenosed.  Dist RCA lesion is 30% stenosed.  Prox RCA lesion is 80% stenosed.  A drug-eluting stent was successfully placed using a STENT RESOLUTE ONYX 3.0X12.  Post intervention, there is a 0% residual stenosis.  LIMA and is normal in caliber.  The graft exhibits no disease.  SVG and is normal in caliber.  The graft exhibits no disease.  SVG.  Origin lesion is 100% stenosed.  SVG.  Origin to Prox Graft lesion is 100% stenosed.  2nd Mrg-1 lesion is 90% stenosed.  Post intervention, there is a 0% residual stenosis.  Scoring balloon angioplasty was performed using a BALLOON WOLVERINE 2.50X10.  2nd Mrg-2 lesion is 90% stenosed.  Scoring balloon angioplasty was performed using a BALLOON WOLVERINE 2.50X10.  Post intervention, there is a 35% residual stenosis.  The left  ventricular systolic function is normal.  LV end diastolic pressure is normal.  The left ventricular ejection fraction is 55-65% by visual estimate.  1. 3 vessel obstructive CAD 2. Patent LIMA to the LAD 3. Patent SVG to the diagonal. 4. Known occlusion of SVG to OM2 and SVG to PDA 5. Restenosis of the stents in the proximal and distal OM2 6. Patent stent in the distal LCx. 7. Normal LV function 8. Normal LVEDP 9. Successful scoring balloon angioplasty of the stents in the proximal and distal OM2 10 Successful PCI of the proximal RCA with DES x 1.  Plan: DAPT indefinitely. Continue aggressive medical therapy. Patient is a candidate for same day DC.  Findings Coronary Findings Diagnostic  Dominance: Right  Left Anterior Descending Prox LAD lesion is 25% stenosed. Mid LAD-1  lesion is 80% stenosed. Mid LAD-2 lesion is 100% stenosed.  Left Circumflex Previously placed Dist Cx drug eluting stent is widely patent.  Second Obtuse Marginal Branch 2nd Mrg-1 lesion is 90% stenosed. The lesion was previously treated using a drug eluting stent between 6-12 months ago. 2nd Mrg-2 lesion is 90% stenosed. The lesion was previously treated using a drug eluting stent between 6-12 months ago.  Right Coronary Artery Prox RCA lesion is 80% stenosed. Mid RCA lesion is 30% stenosed. The lesion was previously treated. Dist RCA lesion is 30% stenosed. The lesion was previously treated.  Right Posterior Atrioventricular Artery RPAV lesion is 99% stenosed.  First Right Posterolateral Branch Collaterals 1st RPL filled by collaterals from Dist LAD.  Third Right Posterolateral Branch Collaterals 3rd RPL filled by collaterals from Dist Cx.  LIMA LIMA Graft To Dist LAD LIMA and is normal in caliber. The graft exhibits no disease.  Saphenous Graft To 2nd Diag SVG and is normal in caliber. The graft exhibits no disease.  Saphenous Graft To 2nd Mrg SVG. Origin lesion is 100% stenosed.  Saphenous Graft To Dist RCA SVG. Origin to Prox Graft lesion is 100% stenosed.  Intervention  2nd Mrg-1 lesion Angioplasty CATH LAUNCHER 105FR EBU3.5 guide catheter was inserted. WIRE ASAHI PROWATER 180CM guidewire used to cross lesion. Scoring balloon angioplasty was performed using a BALLOON WOLVERINE 2.50X10. Maximum pressure: 8 atm. Inflation time: 60 sec. Post-Intervention Lesion Assessment The intervention was successful. Pre-interventional TIMI flow is 3. Post-intervention TIMI flow is 3. No complications occurred at this lesion. There is a 0% residual stenosis post intervention.  2nd Mrg-2 lesion Angioplasty CATH LAUNCHER 105FR EBU3.5 guide catheter was inserted. WIRE ASAHI PROWATER 180CM guidewire used to cross lesion. Scoring balloon angioplasty was performed using a BALLOON  WOLVERINE 2.50X10. Maximum pressure: 6 atm. Inflation time: 60 sec. Post-Intervention Lesion Assessment The intervention was successful. Pre-interventional TIMI flow is 3. Post-intervention TIMI flow is 3. No complications occurred at this lesion. There is a 35% residual stenosis post intervention.  Prox RCA lesion Stent GUIDE CATH VISTA JR4 105F guide catheter was inserted. Lesion crossed with guidewire using a WIRE ASAHI PROWATER 180CM. Pre-stent angioplasty was performed using a BALLOON WOLVERINE 2.50X10. Maximum pressure:  6 atm. A drug-eluting stent was successfully placed using a STENT RESOLUTE ONYX 3.0X12. Stent strut is well apposed. Post-stent angioplasty was performed using a BALLOON SAPPHIRE [place] 3.25X8. Maximum pressure:  16 atm. Post-Intervention Lesion Assessment The intervention was successful. Pre-interventional TIMI flow is 3. Post-intervention TIMI flow is 3. No complications occurred at this lesion. There is a 0% residual stenosis post intervention.   STRESS TESTS  MYOCARDIAL PERFUSION IMAGING 11/26/2019  Narrative  The left ventricular  ejection fraction is moderately decreased (30-44%).  Nuclear stress EF: 34%.  There was no ST segment deviation noted during stress.  Defect 1: There is a medium defect of severe severity present in the basal inferior, mid inferior and apical inferior location.  Findings consistent with prior myocardial infarction with peri-infarct ischemia.  This is a high risk study.  Abnormal, high risk stress nuclear study with prior inferior infarct and mild peri-infarct ischemia.  Gated ejection fraction 34% with global hypokinesis and inferior akinesis.  Moderate left ventricular enlargement.  Study interpreted as high risk due to reduced LV function.   ECHOCARDIOGRAM  ECHOCARDIOGRAM COMPLETE 03/26/2024  Narrative ECHOCARDIOGRAM REPORT    Patient Name:   [name] Date of Exam: 03/26/2024 Medical Rec #:  [id number]       Height:        68.0 in Accession #:    [id number]      Weight:       155.0 lb Date of Birth:  [birth year]      BSA:          1.834 m Patient Age:    77 years        BP:           130/70 mmHg Patient Gender: M               HR:           46 bpm. Exam Location:  [address]  Procedure: 2D Echo, Cardiac Doppler and Color Doppler (Both Spectral and Color Flow Doppler were utilized during procedure).  Indications:    R01.1 Murmur  History:        Patient has prior history of Echocardiogram examinations, most recent 12/18/2019. CAD and Previous Myocardial Infarction, Prior CABG, Arrythmias:Atrial Fibrillation, Signs/Symptoms:Murmur, Dyspnea and Edema; Risk Factors:Former Smoker, Family History of Coronary Artery Disease, Hypertension, Dyslipidemia and Diabetes.  Sonographer:    Ewing Holiday RDCS Referring Phys: Mardell Suttles  IMPRESSIONS   1. Left ventricular ejection fraction, by estimation, is 55 to 60%. The left ventricle has normal function. The left ventricle demonstrates regional wall motion abnormalities (see scoring diagram/findings for description). Left ventricular diastolic function could not be evaluated. 2. Right ventricular systolic function is mildly reduced. The right ventricular size is mildly enlarged. 3. Left atrial size was moderately dilated. 4. Right atrial size was moderately dilated. 5. The mitral valve is normal in structure. Mild mitral valve regurgitation. No evidence of mitral stenosis. 6. Tricuspid valve regurgitation is mild to moderate. 7. The aortic valve is normal in structure. Aortic valve regurgitation is trivial. No aortic stenosis is present. 8. The inferior vena cava is normal in size with greater than 50% respiratory variability, suggesting right atrial pressure of 3 mmHg.  Comparison(s): No significant change from prior study. Prior images reviewed side by side. Retrospectively, a similar area of basal inferior and inferoseptal hypokinesis was also present in  2021.  FINDINGS Left Ventricle: Left ventricular ejection fraction, by estimation, is 55 to 60%. The left ventricle has normal function. The left ventricle demonstrates regional wall motion abnormalities. The left ventricular internal cavity size was normal in size. There is no left ventricular hypertrophy. Abnormal (paradoxical) septal motion consistent with post-operative status. Left ventricular diastolic function could not be evaluated due to atrial fibrillation. Left ventricular diastolic function could not be evaluated.   LV Wall Scoring: The basal inferior segment and basal inferoseptal segment are hypokinetic.  Right Ventricle: The right ventricular size is mildly enlarged. No increase  in right ventricular wall thickness. Right ventricular systolic function is mildly reduced.  Left Atrium: Left atrial size was moderately dilated.  Right Atrium: Right atrial size was moderately dilated.  Pericardium: There is no evidence of pericardial effusion.  Mitral Valve: The mitral valve is normal in structure. Mild mitral valve regurgitation. No evidence of mitral valve stenosis.  Tricuspid Valve: The tricuspid valve is normal in structure. Tricuspid valve regurgitation is mild to moderate. No evidence of tricuspid stenosis.  Aortic Valve: The aortic valve is normal in structure. Aortic valve regurgitation is trivial. No aortic stenosis is present.  Pulmonic Valve: The pulmonic valve was normal in structure. Pulmonic valve regurgitation is trivial. No evidence of pulmonic stenosis.  Aorta: The aortic root is normal in size and structure.  Venous: The inferior vena cava is normal in size with greater than 50% respiratory variability, suggesting right atrial pressure of 3 mmHg.  IAS/Shunts: No atrial level shunt detected by color flow Doppler.   LEFT VENTRICLE PLAX 2D LVIDd:         4.80 cm   Diastology LVIDs:         3.40 cm   LV e' medial:    8.08 cm/s LV PW:         0.80 cm   LV  E/e' medial:  12.2 LV IVS:        1.00 cm   LV e' lateral:   17.20 cm/s LVOT diam:     2.20 cm   LV E/e' lateral: 5.7 LV SV:         42 LV SV Index:   23 LVOT Area:     3.80 cm   RIGHT VENTRICLE RV Basal diam:  4.70 cm RV Mid diam:    3.40 cm RV S prime:     9.39 cm/s TAPSE (M-mode): 1.5 cm RVSP:           26.4 mmHg  LEFT ATRIUM             Index        RIGHT ATRIUM           Index LA diam:        4.40 cm 2.40 cm/m   RA Pressure: 3.00 mmHg LA Vol (A2C):   73.1 ml 39.86 ml/m  RA Area:     22.60 cm LA Vol (A4C):   76.9 ml 41.93 ml/m  RA Volume:   72.00 ml  39.26 ml/m LA Biplane Vol: 75.5 ml 41.17 ml/m AORTIC VALVE LVOT Vmax:   53.65 cm/s LVOT Vmean:  34.900 cm/s LVOT VTI:    0.110 m  AORTA Ao Root diam: 3.20 cm Ao Asc diam:  3.20 cm  MITRAL VALVE               TRICUSPID VALVE MV Area (PHT)  cm         TR Peak grad:   23.4 mmHg MV Decel Time: 249 msec    TR Vmax:        242.00 cm/s MV E velocity: 98.78 cm/s  Estimated RAP:  3.00 mmHg RVSP:           26.4 mmHg  SHUNTS Systemic VTI:  0.11 m Systemic Diam: 2.20 cm  Karyl Paget Croitoru MD Electronically signed by Luana Rumple MD Signature Date/Time: 03/26/2024/2:05:08 PM    Final    MONITORS  CARDIAC EVENT MONITOR 04/14/2016  Narrative  NSR  PACs  Paroxysmal atrial fibrillation. Variable ventricular response.  ______________________________________________________________________________________________      Risk Assessment/Calculations:    CHA2DS2-VASc Score = 5   This indicates a 7.2% annual risk of stroke. The patient's score is based upon: CHF History: 0 HTN History: 1 Diabetes History: 1 Stroke History: 0 Vascular Disease History: 1 Age Score: 2 Gender Score: 0           Physical Exam:   VS:  BP (!) 108/56   Pulse (!) 53   Ht 5\' 8"  (1.727 m)   Wt 150 lb 3.2 oz (68.1 kg)   SpO2 96%   BMI 22.84 kg/m    Wt Readings from Last 3 Encounters:  04/05/24 150 lb 3.2 oz (68.1 kg)   03/20/24 155 lb (70.3 kg)  02/26/24 154 lb (69.9 kg)    GEN: Well nourished, well developed in no acute distress NECK: No JVD; No carotid bruits CARDIAC: Irregularly irregular, no murmurs, rubs, gallops RESPIRATORY:  Clear to auscultation without rales, wheezing or rhonchi  ABDOMEN: Soft, non-tender, non-distended EXTREMITIES:  No edema; No deformity   ASSESSMENT AND PLAN: .    Persistent atrial fibrillation: Patient previously had postop A-fib after bypass surgery, however was not placed on anticoagulation therapy due to lack of recurrence.  He was noted to be back in A-fib in March 2025, we stopped his aspirin  and started him on Eliquis .  He is rate controlled.  I am not confident he is truly symptomatic.  We had a long discussion today, we discussed both options of either continuing with a rate control therapy versus outpatient cardioversion.  He denies any chest pain, palpitation or shortness of breath.  Family noted he has been fatigued lately, however due to his severe dementia, he does not remember that.  He says he does not feel differently than how he has ever felt before.  We decided to continue with rate control therapy for the time being.  CAD s/p CABG: Aspirin  was discontinued in March given the need for Eliquis .  Will continue Plavix  together with Eliquis   Hypertension: Blood pressure well-controlled  Hyperlipidemia: On Repatha        Dispo: Follow-up with Dr. Swaziland as previously scheduled  Signed, Dilynn Munroe, PA

## 2024-05-09 ENCOUNTER — Other Ambulatory Visit: Payer: Self-pay | Admitting: Cardiology

## 2024-05-28 NOTE — Progress Notes (Signed)
 Cardiology Office Note:    Date:  05/31/2024   ID:  [name], DOB 06/27/1946, MRN [id number]  PCP:  Tyler Elsie JONETTA Mickey., MD   [facility] Providers Cardiologist:  Tyler Talent [place], MD {   Referring MD: Tyler Elsie JONETTA Mickey., MD   Chief Complaint  Patient presents with   Atrial Fibrillation   Coronary Artery Disease    History of Present Illness:    [name] is a 78 y.o. male with a hx of CAD s/p CABG 12/2019 (LIMA to LAD, SVG to diagonal, SVG to OM1 and SVG to PDA), postop atrial fibrillation, hypertension, hyperlipidemia and DM2.  He had a STEMI in April 2021 that required stenting of OM 2 and distal left circumflex artery, SVG to OM and PDA were occluded, he did have 70% proximal RCA and a diffusely diseased PLA branch that was filled with left-to-right collaterals, this was managed medically.  Postprocedure, he was placed on aspirin  and Plavix .  Anticoagulation therapy and amiodarone  were discontinued as his A. fib only occurred after bypass surgery.  He was placed on Praluent  in May 2021 for hyperlipidemia.  He had underwent another cardiac catheterization in March 2022, this demonstrated restenosis in the stent of the OM this was treated with a scoring balloon angioplasty, proximal RCA was also stented, LAD distal left circumflex artery stent was patent.  Unfortunately patient went back to the hospital on 03/30/2021 with recurrent chest pain.  Serial troponin was only borderline elevated.  His symptom was concerning for unstable angina.  Repeat cardiac catheterization performed on 03/31/2021 showed 80% mid LAD, 100% mid LAD occlusion, 99% OM2 lesion treated with DES x2, EF 55 to 65%, widely patent distal left circumflex stent.  It is recommended he continue dual antiplatelet therapy indefinitely. [name]   He was seen on 02/26/2024, EKG at the time showed atrial fibrillation with a controlled heart rate of 69 bpm.  He had no cardiac awareness.   He had some lower extremity edema and  started on 20 mg daily of Lasix .  He had significant urine output after only 3 days of diuretic and his lower extremity edema has largely resolved.  Renal function and the electrolyte were stable.  The red blood cell count normal.  His aspirin  was discontinued, he was started on Eliquis  5 mg twice a day.  We continued his Plavix  given previous recommendation of continuing antiplatelet for life.  Subsequent echocardiogram obtained on 03/26/2024 showed EF 55 to 60%, mild MR, mild to moderate TR, trivial AI.  No significant change when compared to the previous study.  Moderately dilated biatrial size. On subsequent follow up DCCV was discussed but deferred.   On follow up today he is seen with his wife. She states he is still active doing yard work and push mowing. Denies any chest pain or SOB. Really not aware of Afib. No edema. Memory is poor.    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   Urinary hesitancy     Past Surgical History:  Procedure Laterality Date   ANKLE ARTHROPLASTY  2006   CARDIOVASCULAR STRESS TEST  01/22/2009   EF 51%, NO ISCHEMIA   CARDIOVASCULAR STRESS TEST     COLONOSCOPY     CORONARY ANGIOPLASTY WITH STENT PLACEMENT  03/2005   CORONARY ARTERY BYPASS GRAFT N/A 12/20/2019   Procedure: CORONARY ARTERY BYPASS GRAFTING (CABG) times four, using left internal mammary artery and left greater saphenous vein;  Surgeon: Tyler Hanford Coy, MD;  Location: [facility] OR;  Service: Open  Heart Surgery;  Laterality: N/A;   CORONARY BALLOON ANGIOPLASTY N/A 02/09/2021   Procedure: CORONARY BALLOON ANGIOPLASTY;  Surgeon: [name], Tyler Rathje M, MD;  Location: [facility] INVASIVE CV LAB;  Service: Cardiovascular;  Laterality: N/A;   CORONARY STENT INTERVENTION N/A 03/16/2020   Procedure: CORONARY STENT INTERVENTION;  Surgeon: [name], Tyler Labelle M, MD;  Location: [facility] INVASIVE CV LAB;  Service: Cardiovascular;  Laterality: N/A;   CORONARY STENT INTERVENTION N/A 02/09/2021   Procedure: CORONARY STENT INTERVENTION;  Surgeon: [name], Tyler Frazee M, MD;  Location: [facility] INVASIVE CV LAB;  Service: Cardiovascular;  Laterality: N/A;   CORONARY STENT INTERVENTION N/A 03/31/2021   Procedure: CORONARY STENT INTERVENTION;  Surgeon: Tyler Deatrice LABOR, MD;  Location: [facility] INVASIVE CV LAB;  Service: Cardiovascular;  Laterality: N/A;   CORONARY STENT PLACEMENT  03/10/2005   SUCCESSFUL STENTING OF THE MID AND CRUX OF THE RCA   CORONARY/GRAFT ACUTE MI REVASCULARIZATION N/A 03/16/2020   Procedure: Coronary/Graft Acute MI Revascularization;  Surgeon: [name], Binnie Vonderhaar M, MD;  Location: [facility] INVASIVE CV LAB;  Service: Cardiovascular;  Laterality: N/A;   ESOPHAGEAL DILATION     multiple procedures   HERNIA REPAIR  11/30/2005   umbilical hernia   INGUINAL HERNIA REPAIR  11/28/2011   Procedure: HERNIA REPAIR INGUINAL ADULT;  Surgeon: Tyler POUR. Belinda, MD;  Location: [facility] OR;  Service: General;  Laterality: Right;  right inguinal hernia repair with mesh   KNEE SURGERY     Right   LEFT HEART CATH AND CORONARY ANGIOGRAPHY N/A 12/03/2019   Procedure: LEFT HEART CATH AND CORONARY ANGIOGRAPHY;  Surgeon: [name], MD;  Location: [facility] INVASIVE CV LAB;  Service: Cardiovascular;  Laterality: N/A;   LEFT HEART CATH AND CORS/GRAFTS ANGIOGRAPHY N/A 03/16/2020   Procedure: LEFT HEART CATH AND CORS/GRAFTS ANGIOGRAPHY;  Surgeon: [name], Aleem Elza M, MD;  Location: [facility] INVASIVE CV LAB;  Service: Cardiovascular;  Laterality: N/A;   LEFT HEART CATH AND CORS/GRAFTS  ANGIOGRAPHY N/A 02/09/2021   Procedure: LEFT HEART CATH AND CORS/GRAFTS ANGIOGRAPHY;  Surgeon: [name], Mattison Stuckey M, MD;  Location: [facility] INVASIVE CV LAB;  Service: Cardiovascular;  Laterality: N/A;   LEFT HEART CATH AND CORS/GRAFTS ANGIOGRAPHY N/A 03/31/2021   Procedure: LEFT HEART CATH AND CORS/GRAFTS ANGIOGRAPHY;  Surgeon: Tyler Deatrice LABOR, MD;  Location: [facility] INVASIVE CV LAB;  Service: Cardiovascular;  Laterality: N/A;   POLYPECTOMY     TEE WITHOUT CARDIOVERSION N/A 12/20/2019   Procedure: TRANSESOPHAGEAL ECHOCARDIOGRAM (TEE);  Surgeon: Tyler Hanford, Coy, MD;  Location: [facility] OR;  Service: Open Heart Surgery;  Laterality: N/A;   TONSILLECTOMY  1958   UMBILICAL HERNIA REPAIR  11/2006   US  ECHOCARDIOGRAPHY      Current Medications: Current Meds  Medication Sig   acetaminophen  (TYLENOL ) 650 MG CR tablet Take 1,300 mg by mouth every 8 (eight) hours as needed for pain.  B Complex-C (B-COMPLEX WITH VITAMIN C) tablet Take 1 tablet by mouth daily with breakfast.    Calcium -Magnesium -Zinc  (CAL-MAG-ZINC  PO) Take 1 tablet by mouth daily with lunch.   cetirizine (ZYRTEC) 10 MG tablet Take 10 mg by mouth in the morning.   finasteride (PROSCAR) 5 MG tablet Take 5 mg by mouth daily.   fluticasone  (FLONASE ) 50 MCG/ACT nasal spray Place 2 sprays into both nostrils daily as needed for allergies.   memantine  (NAMENDA ) 10 MG tablet Take 1 tablet (10 mg total) by mouth 2 (two) times daily.   nitroGLYCERIN  (NITROSTAT ) 0.4 MG SL tablet Place 1 tablet (0.4 mg total) under the tongue every 5 (five) minutes as needed for chest pain.   OneTouch Delica Lancets 30G MISC 1 each by Other route daily.   ONETOUCH VERIO test strip 1 each by Other route daily.   OZEMPIC, 0.25 OR 0.5 MG/DOSE, 2 MG/3ML SOPN Inject into the skin.   pantoprazole  (PROTONIX ) 40 MG tablet Take 1 tablet (40 mg total) by mouth 2 (two) times daily.   promethazine -dextromethorphan (PROMETHAZINE -DM) 6.25-15 MG/5ML syrup Take 5 mLs by mouth 4 (four) times daily as  needed.   Tamsulosin  HCl (FLOMAX ) 0.4 MG CAPS Take 0.4 mg by mouth at bedtime.   [DISCONTINUED] apixaban  (ELIQUIS ) 5 MG TABS tablet Take 1 tablet (5 mg total) by mouth 2 (two) times daily.   [DISCONTINUED] clopidogrel  (PLAVIX ) 75 MG tablet Take 1 tablet (75 mg total) by mouth daily with breakfast.   [DISCONTINUED] empagliflozin  (JARDIANCE ) 25 MG TABS tablet Take 25 mg by mouth in the morning.   [DISCONTINUED] Evolocumab  (REPATHA  SURECLICK) 140 MG/ML SOAJ INJECT ONE (1) PEN INTO THE SKIN EVERY 14 DAYS AS DIRECTED   [DISCONTINUED] furosemide  (LASIX ) 20 MG tablet Take 20 mg by mouth daily.   [DISCONTINUED] isosorbide  mononitrate (IMDUR ) 60 MG 24 hr tablet Take 1 tablet (60 mg total) by mouth daily.   [DISCONTINUED] metoprolol  tartrate (LOPRESSOR ) 25 MG tablet TAKE 1 TABLET BY MOUTH 2 TIMES DAILY   [DISCONTINUED] predniSONE  (STERAPRED UNI-PAK 21 TAB) 10 MG (21) TBPK tablet Take by mouth daily. Take 6 tabs by mouth daily for 1, then 5 tabs for 1 day, then 4 tabs for 1 day, then 3 tabs for 1 day, then 2 tabs for 1 day, then 1 tab for 1 day.     Allergies:   Kiwi extract, Statins, Barbiturates, Bismuth subsalicylate, Codeine, Morphine and codeine, Tape, and Altace [ramipril]   Social History   Socioeconomic History   Marital status: Married    Spouse name: Not on file   Number of children: 3   Years of education: Not on file   Highest education level: Not on file  Occupational History   Occupation: Retired  Tobacco Use   Smoking status: Former    Current packs/day: 0.00    Average packs/day: 1 pack/day for 22.0 years (22.0 ttl pk-yrs)    Types: Cigarettes, Pipe    Start date: 12/17/1992    Quit date: 12/17/2014    Years since quitting: 9.4   Smokeless tobacco: Never  Substance and Sexual Activity   Alcohol use: No    Alcohol/week: 0.0 standard drinks of alcohol   Drug use: No   Sexual activity: Not on file  Other Topics Concern   Not on file  Social History Narrative   Right handed    Drinks caffeine   One story home   Loves cats   Retired   Lives with wife   Social Drivers of  Health   Financial Resource Strain: Not on file  Food Insecurity: Not on file  Transportation Needs: Not on file  Physical Activity: Not on file  Stress: Not on file  Social Connections: Not on file     Family History: The patient's family history includes Cancer in his paternal grandmother; Heart disease in his brother, maternal grandfather, mother, and sister; Ovarian cancer in his paternal grandmother. There is no history of Colon cancer, Esophageal cancer, or Stomach cancer.  ROS:   Please see the history of present illness.     All other systems reviewed and are negative.  EKGs/Labs/Other Studies Reviewed:    The following studies were reviewed today:  Cath 03/31/2021 Mid LAD-1 lesion is 80% stenosed. Prox LAD lesion is 25% stenosed. Mid LAD-2 lesion is 100% stenosed. 2nd Mrg-2 lesion is 99% stenosed. RPAV lesion is 99% stenosed. Mid RCA lesion is 30% stenosed. Dist RCA lesion is 30% stenosed. Previously placed Prox RCA drug eluting stent is widely patent. The left ventricular systolic function is normal. LV end diastolic pressure is mildly elevated. The left ventricular ejection fraction is 55-65% by visual estimate. Previously placed Dist Cx stent (unknown type) is widely patent. Mid Cx to Dist Cx lesion is 30% stenosed. Post intervention, there is a 0% residual stenosis. A drug-eluting stent was successfully placed using a SYNERGY XD 2.50X12. 2nd Mrg-1 lesion is 90% stenosed. Post intervention, there is a 0% residual stenosis. A drug-eluting stent was successfully placed using a SYNERGY XD 2.75X24. Lat 2nd Mrg lesion is 50% stenosed.   1.  Significant underlying three-vessel coronary artery disease with patent LIMA to LAD and patent SVG to diagonal.  Chronically occluded SVG to OM 2 and SVG to right PDA.  Patent RCA stents with mild in-stent restenosis in the mid and  distal stents.  Patent distal left circumflex stent with minimal restenosis.  Severe in-stent restenosis in the proximal as well as distal stent in a large OM2. 2.  Normal LV systolic function mildly elevated left ventricular end-diastolic pressure. 3.  Successful OCT guided PCI and drug-eluting stent placement to the distal and proximal OM2.  The distal stent did not cover the whole length of the previously placed stent as I was concerned about small vessel size distally and in order not to jail the inferior branch which was diseased.  I elected to restent with different platform given the quick restenosis after recent cath and balloon angioplasty.   Recommendations: Continue dual antiplatelet therapy indefinitely if possible. Aggressive treatment of risk factors. Hydrate overnight given underlying chronic kidney disease.  Echo 03/26/24: IMPRESSIONS     1. Left ventricular ejection fraction, by estimation, is 55 to 60%. The  left ventricle has normal function. The left ventricle demonstrates  regional wall motion abnormalities (see scoring diagram/findings for  description). Left ventricular diastolic  function could not be evaluated.   2. Right ventricular systolic function is mildly reduced. The right  ventricular size is mildly enlarged.   3. Left atrial size was moderately dilated.   4. Right atrial size was moderately dilated.   5. The mitral valve is normal in structure. Mild mitral valve  regurgitation. No evidence of mitral stenosis.   6. Tricuspid valve regurgitation is mild to moderate.   7. The aortic valve is normal in structure. Aortic valve regurgitation is  trivial. No aortic stenosis is present.   8. The inferior vena cava is normal in size with greater than 50%  respiratory variability, suggesting right atrial pressure  of 3 mmHg.   Comparison(s): No significant change from prior study. Prior images  reviewed side by side. Retrospectively, a similar area of basal  inferior  and inferoseptal hypokinesis was also present in 2021.   EKG:  EKG is not  ordered today.     Recent Labs: 03/01/2024: BUN 20; Creatinine, Ser 1.31; Hemoglobin 14.5; Platelets 192; Potassium 3.8; Sodium 144  Recent Lipid Panel    Component Value Date/Time   CHOL 109 03/31/2021 0240   CHOL 122 02/01/2021 1136   TRIG 143 03/31/2021 0240   HDL 46 03/31/2021 0240   HDL 53 02/01/2021 1136   CHOLHDL 2.4 03/31/2021 0240   VLDL 29 03/31/2021 0240   LDLCALC 34 03/31/2021 0240   LDLCALC 50 02/01/2021 1136   Dated 04/29/21: cholesterol 110, triglycerides 83, HDL 50, LDL 43.  Dated 09/09/21: A1c 8.3%/  Dated 05/17/22: cholesterol 134, triglycerides 98, HDL 59, LDL 55. Acc 9%. CMET normal Dated 09/08/22: A1c 8.2%  Risk Assessment/Calculations:    CHA2DS2-VASc Score = 5  This indicates a 7.2% annual risk of stroke. The patient's score is based upon: CHF History: 0 HTN History: 1 Diabetes History: 1 Stroke History: 0 Vascular Disease History: 1 Age Score: 2 Gender Score: 0       Physical Exam:    VS:  BP 108/62 (BP Location: Left Arm, Patient Position: Sitting, Cuff Size: Normal)   Pulse (!) 55   Ht 5' 8 (1.727 [name])   Wt 153 lb (69.4 kg)   SpO2 96%   BMI 23.26 kg/[name]     Wt Readings from Last 3 Encounters:  05/31/24 153 lb (69.4 kg)  04/05/24 150 lb 3.2 oz (68.1 kg)  03/20/24 155 lb (70.3 kg)     GEN:  Well nourished, well developed in no acute distress HEENT: Normal NECK: No JVD; No carotid bruits LYMPHATICS: No lymphadenopathy CARDIAC: RRR, no murmurs, rubs, gallops RESPIRATORY:  Clear to auscultation without rales, wheezing or rhonchi  ABDOMEN: Soft, non-tender, non-distended MUSCULOSKELETAL:  No edema; No deformity  SKIN: Warm and dry NEUROLOGIC:  Alert and oriented x 3 PSYCHIATRIC:  Normal affect   ASSESSMENT:    1. Persistent atrial fibrillation (HCC)   2. Coronary artery disease of bypass graft of native heart with stable angina pectoris (HCC)   3.  Essential hypertension   4. Hyperlipidemia LDL goal <55   5. Controlled type 2 diabetes mellitus without complication, without long-term current use of insulin  (HCC)   6. S/P CABG x 4        PLAN:    In order of problems listed above:  CAD s/p CABG 12/2019 with subsequent PCI/DES to OM2 x2 and LCx x1 03/16/20 with cardiac cath showing occlusion of SVG to OM1 and SVG to PDA: patent LIMA to LAD and SVG to diagonal, s/p scoring balloon PCI of the OM2 for restenosis on 02/09/21. PCI of the proximal RCA with DES.  Then underwent DES x2 to distal and proximal OM 2 in late April 2022 for restenosis.   He has done well since then with stable class 1-2 angina.  Continue  Plavix  indefinitely.  On Imdur , amlodipine  and beta blocker. Prescriptions refilled.   Atrial fibrillation - recurred and persistent. Rate is well controlled. He is asymptomatic. On Eliquis . I would not pursue cardioversion.   Hypertension: Blood pressure is well controlled.  Hyperlipidemia: On Praluent  - LDL at goal. Labs followed by Dr Tyler  DM2: per PCP  6.   Memory loss/dementia. Followed by Neuro  I will follow up in 6 months.   Signed, Samora Jernberg [place], MD  05/31/2024 1:55 PM    [facility]

## 2024-05-31 ENCOUNTER — Encounter: Payer: Self-pay | Admitting: Cardiology

## 2024-05-31 ENCOUNTER — Ambulatory Visit: Payer: 59 | Attending: Cardiology | Admitting: Cardiology

## 2024-05-31 VITALS — BP 108/62 | HR 55 | Ht 68.0 in | Wt 153.0 lb

## 2024-05-31 DIAGNOSIS — I4819 Other persistent atrial fibrillation: Secondary | ICD-10-CM

## 2024-05-31 DIAGNOSIS — I25708 Atherosclerosis of coronary artery bypass graft(s), unspecified, with other forms of angina pectoris: Secondary | ICD-10-CM

## 2024-05-31 DIAGNOSIS — Z951 Presence of aortocoronary bypass graft: Secondary | ICD-10-CM

## 2024-05-31 DIAGNOSIS — I10 Essential (primary) hypertension: Secondary | ICD-10-CM | POA: Diagnosis not present

## 2024-05-31 DIAGNOSIS — E785 Hyperlipidemia, unspecified: Secondary | ICD-10-CM

## 2024-05-31 DIAGNOSIS — E119 Type 2 diabetes mellitus without complications: Secondary | ICD-10-CM

## 2024-05-31 MED ORDER — ISOSORBIDE MONONITRATE ER 60 MG PO TB24: 60.0000 mg | ORAL_TABLET | Freq: Every day | ORAL | 3 refills | Status: DC

## 2024-05-31 MED ORDER — EMPAGLIFLOZIN 25 MG PO TABS: 25.0000 mg | ORAL_TABLET | Freq: Every morning | ORAL | 3 refills | Status: AC

## 2024-05-31 MED ORDER — APIXABAN 5 MG PO TABS: 5.0000 mg | ORAL_TABLET | Freq: Two times a day (BID) | ORAL | 3 refills | Status: DC

## 2024-05-31 MED ORDER — REPATHA SURECLICK 140 MG/ML ~~LOC~~ SOAJ: 140.0000 mg | SUBCUTANEOUS | 3 refills | Status: DC

## 2024-05-31 MED ORDER — FUROSEMIDE 20 MG PO TABS: 20.0000 mg | ORAL_TABLET | Freq: Every day | ORAL | 3 refills | Status: DC

## 2024-05-31 MED ORDER — METOPROLOL TARTRATE 25 MG PO TABS: 25.0000 mg | ORAL_TABLET | Freq: Two times a day (BID) | ORAL | 3 refills | Status: DC

## 2024-05-31 MED ORDER — CLOPIDOGREL BISULFATE 75 MG PO TABS: 75.0000 mg | ORAL_TABLET | Freq: Every day | ORAL | 3 refills | Status: DC

## 2024-05-31 NOTE — Patient Instructions (Addendum)
 Medication Instructions:  Continue same medications *If you need a refill on your cardiac medications before your next appointment, please call your pharmacy*  Lab Work: None ordered  Testing/Procedures: None ordered  Follow-Up: At [facility], you and your health needs are our priority.  As part of our continuing mission to provide you with exceptional heart care, our providers are all part of one team.  This team includes your primary Cardiologist (physician) and Advanced Practice Providers or APPs (Physician Assistants and Nurse Practitioners) who all work together to provide you with the care you need, when you need it.  Your next appointment:  6 months    Monday 12/1 at 1:20 pm    Provider:  Dr.Jordan   We recommend signing up for the patient portal called MyChart.  Sign up information is provided on this After Visit Summary.  MyChart is used to connect with patients for Virtual Visits (Telemedicine).  Patients are able to view lab/test results, encounter notes, upcoming appointments, etc.  Non-urgent messages can be sent to your provider as well.   To learn more about what you can do with MyChart, go to [email or web address].

## 2024-06-28 ENCOUNTER — Ambulatory Visit: Admitting: Physician Assistant

## 2024-07-16 ENCOUNTER — Ambulatory Visit: Payer: 59 | Admitting: Physician Assistant

## 2024-08-03 ENCOUNTER — Other Ambulatory Visit: Payer: Self-pay | Admitting: Cardiology

## 2024-08-29 ENCOUNTER — Encounter: Payer: Self-pay | Admitting: Physician Assistant

## 2024-08-29 ENCOUNTER — Ambulatory Visit: Admitting: Physician Assistant

## 2024-08-29 VITALS — BP 106/53 | HR 84 | Resp 20 | Wt 150.0 lb

## 2024-08-29 DIAGNOSIS — G309 Alzheimer's disease, unspecified: Secondary | ICD-10-CM

## 2024-08-29 DIAGNOSIS — F0280 Dementia in other diseases classified elsewhere without behavioral disturbance: Secondary | ICD-10-CM

## 2024-08-29 MED ORDER — MEMANTINE HCL 10 MG PO TABS: 10.0000 mg | ORAL_TABLET | Freq: Two times a day (BID) | ORAL | 3 refills | Status: AC

## 2024-08-29 MED ORDER — MEMANTINE HCL 10 MG PO TABS: 10.0000 mg | ORAL_TABLET | Freq: Two times a day (BID) | ORAL | 3 refills | Status: DC

## 2024-08-29 MED ORDER — DONEPEZIL HCL 5 MG PO TABS: 5.0000 mg | ORAL_TABLET | Freq: Every day | ORAL | 11 refills | Status: AC

## 2024-08-29 NOTE — Progress Notes (Signed)
 Assessment/Plan:    Dementia due to Alzheimer's disease  [name] is a very pleasant 78 y.o. 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 Patient is on memantine  10 mg twice a day.  Discussed adding ACHI, but his pulse needs to be monitored very carefully as he has atrial fibrillation and in the past he had bradycardia, however today his pulse is 84.  Wife would like to try a low-dose of donepezil  and carefully monitor response.  She will let us  know if there is any concerns for bradycardia at which point she agreed that she would discontinue the medicine.  He is still able to participate in some ADLs, no longer drives.      Recommendations:   Follow up in 6 months. Continue memantine  10 mg twice daily, side effects discussed  Start Donepezil  5mg  daily. Side effects discussed, monitor for bradycardia.  Side effects discussed Recommend good control of cardiovascular risk factors Continue to control mood as per PCP    Subjective:   This patient is accompanied in the office by his wife  who supplements the history. Previous records as well as any outside records available were reviewed prior to todays visit.   Patient was last seen on 01/12/2024 with MMSE 16/30.    Any changes in memory since last visit? About the same. Some good and bad days, better outcomes when not  being overwhelmed with stuff to do-wife says. He has very little insight into his condition.  Trouble with long-term memory as well.  He has some repetitive behaviors such as picking his skin.  repeats oneself?  Endorsed Disoriented when walking into a room?  Sometimes he does not know where he is or what where his house is   Misplacing objects?  Endorsed by his family. He takes things apart, he fixes things including the  appliances. Sometimes he forgets how to put the pieces back together.   Wandering behavior?  He is frustrated when he can't remember, and if high sugars may be worse . Most of the time he is accompanied because he has been disoriented outside, not recognizing his neighborhood. Any personality changes since last visit? Denies.   Any worsening depression?: When frustrated he may feel down but not in regular basis Hallucinations or paranoia?  Denies.He thinks people ar in the yard stealing his stuff.   Seizures?   Denies.    Any sleep changes?   Does not sleep very well ,has never been sleeper.  Sometimes he takes a morning nap.  Denies vivid dreams, talks in his sleep, has his own conversation but he has done that all his life, may be worse now. Denies REM behavior or sleepwalking   Sleep apnea?   denies    Any hygiene concerns?   Denies. Needs reminder Independent of bathing and dressing?  His wife has to choose to clothing for him because he is colorblind. Does the patient needs help with medications?  Daughter and wife in charge   Who is in charge of the finances?  Wife is in charge     Any changes in appetite?  Does not eat as much as before, but does eat everyday, takes some supplements Patient have trouble swallowing?  Denies.   Does the patient cook?  Any kitchen accidents such as leaving the stove on?  Denies.   Any headaches?    Denies.   Vision changes? Denies. Chronic pain?  Denies.   Ambulates with difficulty?    Denies.  Walks frequently, mows the yard. Recent falls or head injuries?    Denies.      Unilateral weakness, numbness or tingling?  Denies.   Any tremors?  Denies.   Any anosmia?    Denies.   Any incontinence of urine?  Endorsed, wears diapers Any bowel dysfunction?  Occasional diarrhea .      Patient lives with wife and daughter.  Does the patient drive?  No longer drives    Initial Consult 05/21/21 The patient is seen in neurologic consultation at the request of  [place], Peter M, MD for the evaluation of memory.  The patient is accompanied by wife who supplements the history.  He is a 78 year old male who had memory issues for about 2 years, although he states that after his first MI and CABG in 2021 he began to notice little things, such as losing more frequently that he is, or coming up with the name of an object or another name.  After his second MII, his symptoms appeared to worsen, with significant difficulty in recalling, although stress makes it worse .  His wife reports that when they are due to being alone, these memory issues are less evident, he could almost make it to 100% .  However, while they are surrounded by a crowd, he becomes easily distracted, and he is unable to recall, especially if some new information would have been presented a few minutes earlier, and trying to remember them 5 minutes later.  He reports that he may have undiagnosed ADHD.  He states that when growing up, the teachers would always reprimanded him for not paying attention, dropping out of  High School,  because he could not concentrate.  However, at home he could read the [employer], tons of magazines and books , and also learning to do a lot of manual activities.   He held numerous hands-on jobs, including being a Chiropractor, Music therapist, Curator, working in the [employer], being a Occupational hygienist .  The wife says that he would become bored with one activity, and try something new.  Currently, he likes to do yard work, cutting grass 2 acres of it .  He also likes to play [employer], Copywriter, advertising, board games.   His mood is overall good, without depression or irritability.  He states that he did very well during the pandemic, was not feeling lonely.  He sleeps about 6 hours at night, and always had vivid dreams, no changes in the pattern.  He denies sleepwalking. He is able to do all his activities of daily living, such as bathing, dressing up, taking his  medications without missing doses, or driving without getting lost.  He drives with a GPS but not afraid of getting lost, driving long distances without any issues.  He does his own finances without missing any payments.  In fact, I had to remind my wife to pay on  time .  He does not cook.  He used to have headaches as a youngster, but since moving from [place]  to [place] , the last 34 years he has not had any migraine headaches.   He denies any trauma to the head, double vision, dizziness, focal numbness or tingling, unilateral weakness or tremors.  He has a history of BPH, reporting frequent urination with residual, and has to be seen  by urology soon.  He has an appointment with them.  He denies any constipation or diarrhea.  Denies any history of sleep apnea, alcohol, he has remote tobacco history, quitting a 22 pack year in 2016.  Family history is negative for dementia. 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     CT head wo contrast 12/18/19  Mild chronic ischemic white matter disease. No acute intracranial abnormality seen.     Neuropsychological Evaluation, Dr. Jackquline 09/13/21 Mr. Schindler is thus demonstrating likely memory storage problems and other findings suggestive of temporal lobe dysfunction as can often be observed in age-related progressive causes of cognitive decline. The fact that he has experienced altered mental status after each of his operations is also concerning given that having an underlying condition is a risk factor for delirium (and delirium in turn is a risk factor for dementia). He should be followed over time.  [name] was seen for a psychiatric diagnostic evaluation and neuropsychological testing. He is a 78 year old, right-hand dominant man with a significant amount of central and mesial temporal volume loss on neuroimaging. His evans ratio is abnormal but he has no signs and symptoms of hydrocephalus and I do think this may be ex vacuo (it  was not noted by radiology). 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 and preliminary review of test data suggest that neuropsychological findings are likely to demonstrate a similar pattern  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   Urinary hesitancy      Past Surgical History:  Procedure Laterality Date   ANKLE ARTHROPLASTY  2006   CARDIOVASCULAR STRESS TEST  01/22/2009   EF 51%, NO ISCHEMIA   CARDIOVASCULAR STRESS TEST     COLONOSCOPY     CORONARY ANGIOPLASTY WITH STENT PLACEMENT  03/2005   CORONARY ARTERY BYPASS GRAFT N/A 12/20/2019   Procedure: CORONARY ARTERY BYPASS GRAFTING (CABG) times four, using left internal mammary artery and left greater saphenous vein;  Surgeon: Fleeta Hanford Coy, MD;  Location: [facility] OR;  Service: Open Heart Surgery;  Laterality: N/A;   CORONARY BALLOON ANGIOPLASTY N/A 02/09/2021   Procedure: CORONARY BALLOON ANGIOPLASTY;  Surgeon: Swaziland, Peter M, MD;  Location: [facility] INVASIVE CV LAB;  Service: Cardiovascular;  Laterality: N/A;   CORONARY STENT INTERVENTION N/A 03/16/2020   Procedure: CORONARY STENT INTERVENTION;  Surgeon: Swaziland, Peter M, MD;  Location: [facility] INVASIVE CV LAB;  Service: Cardiovascular;  Laterality: N/A;   CORONARY STENT INTERVENTION N/A 02/09/2021   Procedure: CORONARY STENT INTERVENTION;  Surgeon: Swaziland, Peter M, MD;  Location: [facility] INVASIVE CV LAB;  Service: Cardiovascular;  Laterality: N/A;   CORONARY  STENT INTERVENTION N/A 03/31/2021   Procedure: CORONARY STENT INTERVENTION;  Surgeon: Darron Deatrice DELENA, MD;  Location: [facility] INVASIVE CV LAB;  Service: Cardiovascular;  Laterality: N/A;   CORONARY STENT PLACEMENT  03/10/2005   SUCCESSFUL STENTING OF THE MID AND CRUX OF THE RCA   CORONARY/GRAFT ACUTE MI REVASCULARIZATION N/A 03/16/2020   Procedure: Coronary/Graft Acute MI Revascularization;  Surgeon: Swaziland, Peter M, MD;  Location: [facility] INVASIVE CV LAB;  Service: Cardiovascular;  Laterality: N/A;   ESOPHAGEAL DILATION     multiple procedures   HERNIA REPAIR  11/30/2005   umbilical hernia   INGUINAL HERNIA REPAIR  11/28/2011   Procedure: HERNIA REPAIR INGUINAL ADULT;  Surgeon: [name], MD;  Location: [facility] OR;  Service: General;  Laterality: Right;  right inguinal hernia repair with mesh   KNEE SURGERY     Right   LEFT HEART CATH AND CORONARY ANGIOGRAPHY N/A 12/03/2019   Procedure: LEFT HEART CATH AND CORONARY ANGIOGRAPHY;  Surgeon: [name], MD;  Location: [facility] INVASIVE CV LAB;  Service: Cardiovascular;  Laterality: N/A;   LEFT HEART CATH AND CORS/GRAFTS ANGIOGRAPHY N/A 03/16/2020   Procedure: LEFT HEART CATH AND CORS/GRAFTS ANGIOGRAPHY;  Surgeon: [place]azil[place], Peter M, MD;  Location: [facility] INVASIVE CV LAB;  Service: Cardiovascular;  Laterality: N/A;   LEFT HEART CATH AND CORS/GRAFTS ANGIOGRAPHY N/A 02/09/2021   Procedure: LEFT HEART CATH AND CORS/GRAFTS ANGIOGRAPHY;  Surgeon: [place]azil[place], Peter M, MD;  Location: [facility] INVASIVE CV LAB;  Service: Cardiovascular;  Laterality: N/A;   LEFT HEART CATH AND CORS/GRAFTS ANGIOGRAPHY N/A 03/31/2021   Procedure: LEFT HEART CATH AND CORS/GRAFTS ANGIOGRAPHY;  Surgeon: [name], MD;  Location: [facility] INVASIVE CV LAB;  Service: Cardiovascular;  Laterality: N/A;   POLYPECTOMY     TEE WITHOUT CARDIOVERSION N/A 12/20/2019   Procedure: TRANSESOPHAGEAL ECHOCARDIOGRAM (TEE);  Surgeon: Fleeta Ochoa, Maude, MD;  Location: [facility] OR;  Service: Open Heart Surgery;  Laterality: N/A;    TONSILLECTOMY  1958   UMBILICAL HERNIA REPAIR  11/2006   US  ECHOCARDIOGRAPHY       PREVIOUS MEDICATIONS:   CURRENT MEDICATIONS:  Outpatient Encounter Medications as of 08/29/2024  Medication Sig   acetaminophen  (TYLENOL ) 650 MG CR tablet Take 1,300 mg by mouth every 8 (eight) hours as needed for pain.   apixaban  (ELIQUIS ) 5 MG TABS tablet Take 1 tablet (5 mg total) by mouth 2 (two) times daily.   B Complex-C (B-COMPLEX WITH VITAMIN C) tablet Take 1 tablet by mouth daily with breakfast.    Calcium -Magnesium -Zinc  (CAL-MAG-ZINC  PO) Take 1 tablet by mouth daily with lunch.   cetirizine (ZYRTEC) 10 MG tablet Take 10 mg by mouth in the morning.   clopidogrel  (PLAVIX ) 75 MG tablet Take 1 tablet (75 mg total) by mouth daily with breakfast.   donepezil  (ARICEPT ) 5 MG tablet Take 1 tablet (5 mg total) by mouth daily.   empagliflozin  (JARDIANCE ) 25 MG TABS tablet Take 1 tablet (25 mg total) by mouth in the morning.   Evolocumab  (REPATHA  SURECLICK) 140 MG/ML SOAJ Inject 140 mg into the skin every 14 (fourteen) days.   finasteride (PROSCAR) 5 MG tablet Take 5 mg by mouth daily.   fluticasone  (FLONASE ) 50 MCG/ACT nasal spray Place 2 sprays into both nostrils daily as needed for allergies.   furosemide  (LASIX ) 20 MG tablet Take 1 tablet (20 mg total) by mouth daily.   isosorbide  mononitrate (IMDUR ) 60 MG 24 hr tablet Take 1 tablet (60 mg total) by mouth daily.   metoprolol  tartrate (LOPRESSOR ) 25 MG tablet Take 1 tablet (25 mg total) by mouth 2 (two) times daily.   nitroGLYCERIN  (NITROSTAT ) 0.4 MG SL tablet Place 1 tablet (0.4  mg total) under the tongue every 5 (five) minutes as needed for chest pain.   OneTouch Delica Lancets 30G MISC 1 each by Other route daily.   ONETOUCH VERIO test strip 1 each by Other route daily.   OZEMPIC, 0.25 OR 0.5 MG/DOSE, 2 MG/3ML SOPN Inject into the skin.   pantoprazole  (PROTONIX ) 40 MG tablet Take 1 tablet (40 mg total) by mouth 2 (two) times daily.    promethazine -dextromethorphan (PROMETHAZINE -DM) 6.25-15 MG/5ML syrup Take 5 mLs by mouth 4 (four) times daily as needed.   Tamsulosin  HCl (FLOMAX ) 0.4 MG CAPS Take 0.4 mg by mouth at bedtime.   [DISCONTINUED] memantine  (NAMENDA ) 10 MG tablet Take 1 tablet (10 mg total) by mouth 2 (two) times daily.   [DISCONTINUED] memantine  (NAMENDA ) 10 MG tablet Take 1 tablet (10 mg total) by mouth 2 (two) times daily.   No facility-administered encounter medications on file as of 08/29/2024.     Objective:     PHYSICAL EXAMINATION:    VITALS:   Vitals:   08/29/24 0910  BP: (!) 106/53  Pulse: 84  Resp: 20  SpO2: 98%  Weight: 150 lb (68 kg)    GEN:  The patient appears stated age and is in NAD. HEENT:  Normocephalic, atraumatic.   Neurological examination:  General: NAD, well-groomed, appears stated age. Orientation: The patient is alert. Oriented to person, not to place or to date. Cranial nerves: There is good facial symmetry.The speech is fluent and clear, tangential. No aphasia or dysarthria. Fund of knowledge is reduced. Recent memory impaired and remote memory is normal.  Attention and concentration are normal.  Able to name objects and repeat phrases.  Hearing is intact to conversational tone.    Sensation: Sensation is intact to light touch throughout Motor: Strength is at least antigravity x4. DTR's 2/4 in UE/LE      05/21/2021    2:00 PM  Montreal Cognitive Assessment   Visuospatial/ Executive (0/5) 4  Naming (0/3) 2  Attention: Read list of digits (0/2) 2  Attention: Read list of letters (0/1) 0  Attention: Serial 7 subtraction starting at 100 (0/3) 3  Language: Repeat phrase (0/2) 2  Language : Fluency (0/1) 0  Abstraction (0/2) 1  Delayed Recall (0/5) 0  Orientation (0/6) 5  Total 19  Adjusted Score (based on education) 20       01/12/2024    2:00 PM 07/11/2023    4:00 PM 12/14/2022    1:00 PM  MMSE - Mini Mental State Exam  Orientation to time 0 1 5  Orientation to  Place 0 2 3  Registration 3 3 3   Attention/ Calculation 4 0 4  Recall 0 0 0  Language- name 2 objects 2 2 2   Language- repeat 1 1 1   Language- follow 3 step command 3 3 3   Language- read & follow direction 1 1 1   Write a sentence 1 1 1   Copy design 1 1 1   Total score 16 15 24        Movement examination: Tone: There is normal tone in the UE/LE Abnormal movements:  no tremor.  No myoclonus.  No asterixis.   Coordination:  There is no decremation with RAM's. Normal finger to nose  Gait and Station: The patient has no difficulty arising out of a deep-seated chair without the use of the hands. The patient's stride length is good.  Gait is cautious and narrow.   Thank you for allowing us  the opportunity to participate in the  care of this nice patient. Please do not hesitate to contact us  for any questions or concerns.   Total time spent on today's visit was 30 minutes dedicated to this patient today, preparing to see patient, examining the patient, ordering tests and/or medications and counseling the patient, documenting clinical information in the EHR or other health record, independently interpreting results and communicating results to the patient/family, discussing treatment and goals, answering patient's questions and coordinating care.  Cc:  [name]., MD  Camie Sevin 08/29/2024 11:58 AM

## 2024-08-29 NOTE — Patient Instructions (Addendum)
 It was a pleasure to see you today at our office.   Recommendations:  Follow up in 6  months Continue Memantine  10 mg twice daily. Side effects were discussed  Start Donepezil  5mg  daily. Side effects discussed , monitor his pulse while on it Check hearing on a regular basis          RECOMMENDATIONS FOR ALL PATIENTS WITH MEMORY PROBLEMS: 1. Continue to exercise (Recommend 30 minutes of walking everyday, or 3 hours every week) 2. Increase social interactions - continue going to [place] and enjoy social gatherings with friends and family 3. Eat healthy, avoid fried foods and eat more fruits and vegetables 4. Maintain adequate blood pressure, blood sugar, and blood cholesterol level. Reducing the risk of stroke and cardiovascular disease also helps promoting better memory. 5. Avoid stressful situations. Live a simple life and avoid aggravations. Organize your time and prepare for the next day in anticipation. 6. Sleep well, avoid any interruptions of sleep and avoid any distractions in the bedroom that may interfere with adequate sleep quality 7. Avoid sugar, avoid sweets as there is a strong link between excessive sugar intake, diabetes, and cognitive impairment We discussed the Mediterranean diet, which has been shown to help patients reduce the risk of progressive memory disorders and reduces cardiovascular risk. This includes eating fish, eat fruits and green leafy vegetables, nuts like almonds and hazelnuts, walnuts, and also use olive oil. Avoid fast foods and fried foods as much as possible. Avoid sweets and sugar as sugar use has been linked to worsening of memory function.  There is always a concern of gradual progression of memory problems. If this is the case, then we may need to adjust level of care according to patient needs. Support, both to the patient and caregiver, should then be put into place.    The [employer] is here all day, every day for people facing  Alzheimer's disease through our free 24/7 Helpline: [phone]. The Helpline provides reliable information and support to all those who need assistance, such as individuals living with memory loss, Alzheimer's or other dementia, caregivers, health care professionals and the public.  Our highly trained and knowledgeable staff can help you with: Understanding memory loss, dementia and Alzheimer's  Medications and other treatment options  General information about aging and brain health  Skills to provide quality care and to find the best care from professionals  Legal, financial and living-arrangement decisions Our Helpline also features: Confidential care consultation provided by master's level clinicians who can help with decision-making support, crisis assistance and education on issues families face every day  Help in a caller's preferred language using our translation service that features more than 200 languages and dialects  Referrals to local community programs, services and ongoing support     FALL PRECAUTIONS: Be cautious when walking. Scan the area for obstacles that may increase the risk of trips and falls. When getting up in the mornings, sit up at the edge of the bed for a few minutes before getting out of bed. Consider elevating the bed at the head end to avoid drop of blood pressure when getting up. Walk always in a well-lit room (use night lights in the walls). Avoid area rugs or power cords from appliances in the middle of the walkways. Use a walker or a cane if necessary and consider physical therapy for balance exercise. Get your eyesight checked regularly.  FINANCIAL OVERSIGHT: Supervision, especially oversight when making financial decisions or transactions is also recommended.  HOME SAFETY: Consider the safety of the kitchen when operating appliances like stoves, microwave oven, and blender. Consider having supervision and share cooking responsibilities until no longer able  to participate in those. Accidents with firearms and other hazards in the house should be identified and addressed as well.   ABILITY TO BE LEFT ALONE: If patient is unable to contact 911 operator, consider using LifeLine, or when the need is there, arrange for someone to stay with patients. Smoking is a fire hazard, consider supervision or cessation. Risk of wandering should be assessed by caregiver and if detected at any point, supervision and safe proof recommendations should be instituted.  MEDICATION SUPERVISION: Inability to self-administer medication needs to be constantly addressed. Implement a mechanism to ensure safe administration of the medications.   DRIVING: Regarding driving, in patients with progressive memory problems, driving will be impaired. We advise to have someone else do the driving if trouble finding directions or if minor accidents are reported. Independent driving assessment is available to determine safety of driving.   If you are interested in the driving assessment, you can contact the following:  [employer] in [place] [phone]  [facility] [phone]  [facility] [phone] or [phone]     Mediterranean Diet A Mediterranean diet refers to food and lifestyle choices that are based on the traditions of countries located on the [employer]. This way of eating has been shown to help prevent certain conditions and improve outcomes for people who have chronic diseases, like kidney disease and heart disease. What are tips for following this plan? Lifestyle  Cook and eat meals together with your family, when possible. Drink enough fluid to keep your urine clear or pale yellow. Be physically active every day. This includes: Aerobic exercise like running or swimming. Leisure activities like gardening, walking, or housework. Get 7-8 hours of sleep each night. If  recommended by your health care provider, drink red wine in moderation. This means 1 glass a day for nonpregnant women and 2 glasses a day for men. A glass of wine equals 5 oz (150 mL). Reading food labels  Check the serving size of packaged foods. For foods such as rice and pasta, the serving size refers to the amount of cooked product, not dry. Check the total fat in packaged foods. Avoid foods that have saturated fat or trans fats. Check the ingredients list for added sugars, such as corn syrup. Shopping  At the grocery store, buy most of your food from the areas near the walls of the store. This includes: Fresh fruits and vegetables (produce). Grains, beans, nuts, and seeds. Some of these may be available in unpackaged forms or large amounts (in bulk). Fresh seafood. Poultry and eggs. Low-fat dairy products. Buy whole ingredients instead of prepackaged foods. Buy fresh fruits and vegetables in-season from local farmers markets. Buy frozen fruits and vegetables in resealable bags. If you do not have access to quality fresh seafood, buy precooked frozen shrimp or canned fish, such as tuna, salmon, or sardines. Buy small amounts of raw or cooked vegetables, salads, or olives from the deli or salad bar at your store. Stock your pantry so you always have certain foods on hand, such as olive oil, canned tuna, canned tomatoes, rice, pasta, and beans. Cooking  Cook foods with extra-virgin olive oil instead of using butter or other vegetable oils. Have meat as a side dish, and have vegetables or grains as your main dish. This  means having meat in small portions or adding small amounts of meat to foods like pasta or stew. Use beans or vegetables instead of meat in common dishes like chili or lasagna. Experiment with different cooking methods. Try roasting or broiling vegetables instead of steaming or sauteing them. Add frozen vegetables to soups, stews, pasta, or rice. Add nuts or seeds for added  healthy fat at each meal. You can add these to yogurt, salads, or vegetable dishes. Marinate fish or vegetables using olive oil, lemon juice, garlic, and fresh herbs. Meal planning  Plan to eat 1 vegetarian meal one day each week. Try to work up to 2 vegetarian meals, if possible. Eat seafood 2 or more times a week. Have healthy snacks readily available, such as: Vegetable sticks with hummus. Greek yogurt. Fruit and nut trail mix. Eat balanced meals throughout the week. This includes: Fruit: 2-3 servings a day Vegetables: 4-5 servings a day Low-fat dairy: 2 servings a day Fish, poultry, or lean meat: 1 serving a day Beans and legumes: 2 or more servings a week Nuts and seeds: 1-2 servings a day Whole grains: 6-8 servings a day Extra-virgin olive oil: 3-4 servings a day Limit red meat and sweets to only a few servings a month What are my food choices? Mediterranean diet Recommended Grains: Whole-grain pasta. Brown rice. Bulgar wheat. Polenta. Couscous. Whole-wheat bread. [place] Madeira. Vegetables: Artichokes. Beets. Broccoli. Cabbage. Carrots. Eggplant. Green beans. Chard. Kale. Spinach. Onions. Leeks. Peas. Squash. Tomatoes. Peppers. Radishes. Fruits: Apples. Apricots. Avocado. Berries. Bananas. Cherries. Dates. Figs. Grapes. Lemons. Melon. Oranges. Peaches. Plums. Pomegranate. Meats and other protein foods: Beans. Almonds. Sunflower seeds. Pine nuts. Peanuts. Cod. Salmon. Scallops. Shrimp. Tuna. Tilapia. Clams. Oysters. Eggs. Dairy: Low-fat milk. Cheese. Greek yogurt. Beverages: Water. Red wine. Herbal tea. Fats and oils: Extra virgin olive oil. Avocado oil. Grape seed oil. Sweets and desserts: [place] yogurt with honey. Baked apples. Poached pears. Trail mix. Seasoning and other foods: Basil. Cilantro. Coriander. Cumin. Mint. Parsley. Sage. Rosemary. Tarragon. Garlic. Oregano. Thyme. Pepper. Balsalmic vinegar. Tahini. Hummus. Tomato sauce. Olives. Mushrooms. Limit these Grains:  Prepackaged pasta or rice dishes. Prepackaged cereal with added sugar. Vegetables: Deep fried potatoes (french fries). Fruits: Fruit canned in syrup. Meats and other protein foods: Beef. Pork. Lamb. Poultry with skin. Hot dogs. [place]. Dairy: Ice cream. Sour cream. Whole milk. Beverages: Juice. Sugar-sweetened soft drinks. Beer. Liquor and spirits. Fats and oils: Butter. Canola oil. Vegetable oil. Beef fat (tallow). Lard. Sweets and desserts: Cookies. Cakes. Pies. Candy. Seasoning and other foods: Mayonnaise. Premade sauces and marinades. The items listed may not be a complete list. Talk with your dietitian about what dietary choices are right for you. Summary The Mediterranean diet includes both food and lifestyle choices. Eat a variety of fresh fruits and vegetables, beans, nuts, seeds, and whole grains. Limit the amount of red meat and sweets that you eat. Talk with your health care provider about whether it is safe for you to drink red wine in moderation. This means 1 glass a day for nonpregnant women and 2 glasses a day for men. A glass of wine equals 5 oz (150 mL). This information is not intended to replace advice given to you by your health care provider. Make sure you discuss any questions you have with your health care provider. Document Released: 07/14/2016 Document Revised: 08/16/2016 Document Reviewed: 07/14/2016 Elsevier Interactive Patient Education  2017 [employer].

## 2024-09-12 ENCOUNTER — Ambulatory Visit
Admission: RE | Admit: 2024-09-12 | Discharge: 2024-09-12 | Disposition: A | Discharge status: Home / Self Care | Source: Ambulatory Visit | Attending: Emergency Medicine | Admitting: Emergency Medicine

## 2024-09-12 VITALS — BP 141/76 | HR 60 | Temp 98.0°F | Resp 20 | Wt 153.2 lb

## 2024-09-12 DIAGNOSIS — J069 Acute upper respiratory infection, unspecified: Secondary | ICD-10-CM | POA: Diagnosis not present

## 2024-09-12 LAB — RESP PANEL BY RT-PCR (FLU A&B, COVID) ARPGX2
Influenza A by PCR: NEGATIVE
Influenza B by PCR: NEGATIVE
SARS Coronavirus 2 by RT PCR: NEGATIVE

## 2024-09-12 MED ORDER — BENZONATATE 100 MG PO CAPS: 200.0000 mg | ORAL_CAPSULE | Freq: Three times a day (TID) | ORAL | 0 refills | Status: AC

## 2024-09-12 MED ORDER — IPRATROPIUM BROMIDE 0.06 % NA SOLN: 2.0000 | Freq: Four times a day (QID) | NASAL | 12 refills | Status: AC

## 2024-09-12 MED ORDER — PROMETHAZINE-DM 6.25-15 MG/5ML PO SYRP: 5.0000 mL | ORAL_SOLUTION | Freq: Four times a day (QID) | ORAL | 0 refills | Status: AC | PRN

## 2024-09-12 NOTE — ED Triage Notes (Signed)
 Patient's wife states about 5 days of cough that seems to be worsening.  Took 1 dose of an old promethazine  cough syrup this morning.  Endorses that chest hurts to cough, patient with dementia wife in room

## 2024-09-12 NOTE — ED Provider Notes (Signed)
 [facility]    CSN: [id number] Arrival date & time: 09/12/24  1632      History   Chief Complaint Chief Complaint  Patient presents with   Cough    Sneezing, runny nose, congestion, - Entered by patient    HPI [name] is a 78 y.o. male.   HPI  78 year old male with past medical history significant for diabetes, hypertension, GERD, dysrhythmia, BPH, CAD, hiatal hernia, atrial fibrillation, renal insufficiency, and mild neurocognitive disorder presents for evaluation of 5 days worth of respiratory symptoms to include runny nose, nasal congestion, sneezing, and a nonproductive cough.  In the last 3 days he has developed pain in his ribs when he coughs.  No fever, ear pain, wheezing, or shortness of breath.  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   Urinary hesitancy     Patient Active Problem List   Diagnosis Date Noted   Frequency of micturition 10/05/2022   Orthostatic lightheadedness 10/05/2022   Renal insufficiency, mild 04/01/2021   BPH (benign prostatic hyperplasia) 04/01/2021   Mild neurocognitive disorder 04/01/2021   History of nonmelanoma skin cancer 03/01/2021   Encounter for post surgical wound check 04/08/2020   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 with VG failure of bypass grafts and stents to OM and LCX  03/18/2020   CAD (coronary artery disease) of bypass graft 03/16/20 03/18/2020   STEMI involving  left circumflex coronary artery (HCC) 03/16/2020   Postop check 01/22/2020   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 12/20/2019   Unstable angina (HCC) 12/04/2019   Abnormal nuclear stress test    PAF (paroxysmal atrial fibrillation) (HCC) 04/21/2016   Pre-syncope 04/10/2016   Diabetes (HCC) 04/10/2016   HLD (hyperlipidemia)    Right inguinal hernia 10/18/2011   History of colonic polyps 06/13/2011   Guaiac positive stools 06/13/2011   GERD (gastroesophageal reflux disease) 06/13/2011   ARTHRITIS 03/03/2009   DIABETES MELLITUS 03/02/2009   Essential hypertension 03/02/2009   Coronary atherosclerosis 03/02/2009   HEMORRHOIDS 03/02/2009   ESOPHAGEAL STRICTURE 03/02/2009   GERD 03/02/2009   HIATAL HERNIA 03/02/2009   POLYP, GALLBLADDER 03/02/2009    Past Surgical History:  Procedure Laterality Date   ANKLE ARTHROPLASTY  2006   CARDIOVASCULAR STRESS TEST  01/22/2009   EF 51%, NO ISCHEMIA   CARDIOVASCULAR STRESS TEST     COLONOSCOPY     CORONARY ANGIOPLASTY WITH STENT PLACEMENT  03/2005   CORONARY ARTERY BYPASS GRAFT N/A 12/20/2019   Procedure: CORONARY ARTERY BYPASS GRAFTING (CABG) times four, using left internal mammary artery and left greater saphenous vein;  Surgeon: Fleeta Hanford Coy, MD;  Location: [facility] OR;  Service: Open Heart Surgery;  Laterality: N/A;   CORONARY BALLOON ANGIOPLASTY N/A 02/09/2021   Procedure: CORONARY BALLOON ANGIOPLASTY;  Surgeon: Swaziland, Peter M, MD;  Location: [facility]  INVASIVE CV LAB;  Service: Cardiovascular;  Laterality: N/A;   CORONARY STENT INTERVENTION N/A 03/16/2020   Procedure: CORONARY STENT INTERVENTION;  Surgeon: Swaziland, Peter M, MD;  Location: [facility] INVASIVE CV LAB;  Service: Cardiovascular;  Laterality: N/A;   CORONARY STENT INTERVENTION N/A 02/09/2021   Procedure: CORONARY STENT INTERVENTION;  Surgeon: Swaziland, Peter M, MD;  Location: [facility] INVASIVE CV LAB;  Service: Cardiovascular;  Laterality: N/A;   CORONARY STENT INTERVENTION N/A 03/31/2021   Procedure: CORONARY STENT  INTERVENTION;  Surgeon: [name], MD;  Location: [facility] INVASIVE CV LAB;  Service: Cardiovascular;  Laterality: N/A;   CORONARY STENT PLACEMENT  03/10/2005   SUCCESSFUL STENTING OF THE MID AND CRUX OF THE RCA   CORONARY/GRAFT ACUTE MI REVASCULARIZATION N/A 03/16/2020   Procedure: Coronary/Graft Acute MI Revascularization;  Surgeon: Swaziland, Peter M, MD;  Location: [facility] INVASIVE CV LAB;  Service: Cardiovascular;  Laterality: N/A;   ESOPHAGEAL DILATION     multiple procedures   HERNIA REPAIR  11/30/2005   umbilical hernia   INGUINAL HERNIA REPAIR  11/28/2011   Procedure: HERNIA REPAIR INGUINAL ADULT;  Surgeon: [name], MD;  Location: [facility] OR;  Service: General;  Laterality: Right;  right inguinal hernia repair with mesh   KNEE SURGERY     Right   LEFT HEART CATH AND CORONARY ANGIOGRAPHY N/A 12/03/2019   Procedure: LEFT HEART CATH AND CORONARY ANGIOGRAPHY;  Surgeon: [name], MD;  Location: [facility] INVASIVE CV LAB;  Service: Cardiovascular;  Laterality: N/A;   LEFT HEART CATH AND CORS/GRAFTS ANGIOGRAPHY N/A 03/16/2020   Procedure: LEFT HEART CATH AND CORS/GRAFTS ANGIOGRAPHY;  Surgeon: [place]azil[place], Peter M, MD;  Location: [facility] INVASIVE CV LAB;  Service: Cardiovascular;  Laterality: N/A;   LEFT HEART CATH AND CORS/GRAFTS ANGIOGRAPHY N/A 02/09/2021   Procedure: LEFT HEART CATH AND CORS/GRAFTS ANGIOGRAPHY;  Surgeon: [place]azil[place], Peter M, MD;  Location: [facility] INVASIVE CV LAB;  Service: Cardiovascular;  Laterality: N/A;   LEFT HEART CATH AND CORS/GRAFTS ANGIOGRAPHY N/A 03/31/2021   Procedure: LEFT HEART CATH AND CORS/GRAFTS ANGIOGRAPHY;  Surgeon: [name], MD;  Location: [facility] INVASIVE CV LAB;  Service: Cardiovascular;  Laterality: N/A;   POLYPECTOMY     TEE WITHOUT CARDIOVERSION N/A 12/20/2019   Procedure: TRANSESOPHAGEAL ECHOCARDIOGRAM (TEE);  Surgeon: Fleeta Ochoa, Maude, MD;  Location: [facility] OR;  Service: Open Heart Surgery;  Laterality: N/A;   TONSILLECTOMY  1958   UMBILICAL HERNIA REPAIR  11/2006    US  ECHOCARDIOGRAPHY         Home Medications    Prior to Admission medications   Medication Sig Start Date End Date Taking? Authorizing Provider  benzonatate (TESSALON) 100 MG capsule Take 2 capsules (200 mg total) by mouth every 8 (eight) hours. 09/12/24  Yes Bernardino Ditch, NP  ipratropium (ATROVENT) 0.06 % nasal spray Place 2 sprays into both nostrils 4 (four) times daily. 09/12/24  Yes Bernardino Ditch, NP  promethazine -dextromethorphan (PROMETHAZINE -DM) 6.25-15 MG/5ML syrup Take 5 mLs by mouth 4 (four) times daily as needed. 09/12/24  Yes Bernardino Ditch, NP  acetaminophen  (TYLENOL ) 650 MG CR tablet Take 1,300 mg by mouth every 8 (eight) hours as needed for pain.    [provider]  apixaban  (ELIQUIS ) 5 MG TABS tablet Take 1 tablet (5 mg total) by mouth 2 (two) times daily. 05/31/24   [place], Peter M, MD  B Complex-C (B-COMPLEX WITH VITAMIN C) tablet Take 1 tablet by mouth daily with breakfast.     [provider]  Calcium -Magnesium -Zinc  (CAL-MAG-ZINC  PO) Take  1 tablet by mouth daily with lunch.    [provider]  cetirizine (ZYRTEC) 10 MG tablet Take 10 mg by mouth in the morning.    [provider]  clopidogrel  (PLAVIX ) 75 MG tablet Take 1 tablet (75 mg total) by mouth daily with breakfast. 05/31/24   [place], Peter M, MD  donepezil  (ARICEPT ) 5 MG tablet Take 1 tablet (5 mg total) by mouth daily. 08/29/24   Wertman, Sara E, PA-C  empagliflozin  (JARDIANCE ) 25 MG TABS tablet Take 1 tablet (25 mg total) by mouth in the morning. 05/31/24   [place], Peter M, MD  Evolocumab  (REPATHA  SURECLICK) 140 MG/ML SOAJ Inject 140 mg into the skin every 14 (fourteen) days. 05/31/24   [place], Peter M, MD  finasteride (PROSCAR) 5 MG tablet Take 5 mg by mouth daily. 12/28/21   [provider]  fluticasone  (FLONASE ) 50 MCG/ACT nasal spray Place 2 sprays into both nostrils daily as needed for allergies.    [provider]  furosemide  (LASIX ) 20 MG tablet Take 1 tablet (20  mg total) by mouth daily. 05/31/24   [place], Peter M, MD  isosorbide  mononitrate (IMDUR ) 60 MG 24 hr tablet Take 1 tablet (60 mg total) by mouth daily. 05/31/24   [place], Peter M, MD  memantine  (NAMENDA ) 10 MG tablet Take 1 tablet (10 mg total) by mouth 2 (two) times daily. 08/29/24   Wertman, Sara E, PA-C  metoprolol  tartrate (LOPRESSOR ) 25 MG tablet Take 1 tablet (25 mg total) by mouth 2 (two) times daily. 05/31/24   [place], Peter M, MD  nitroGLYCERIN  (NITROSTAT ) 0.4 MG SL tablet Place 1 tablet (0.4 mg total) under the tongue every 5 (five) minutes as needed for chest pain. 06/02/23   [place], Peter M, MD  OneTouch Delica Lancets 30G MISC 1 each by Other route daily. 02/14/20   [provider]  [facility] VERIO test strip 1 each by Other route daily. 02/13/20   [provider]  OZEMPIC, 0.25 OR 0.5 MG/DOSE, 2 MG/3ML SOPN Inject into the skin. 09/10/22   [provider]  pantoprazole  (PROTONIX ) 40 MG tablet Take 1 tablet (40 mg total) by mouth 2 (two) times daily. 08/06/24   [place], Peter M, MD  Tamsulosin  HCl (FLOMAX ) 0.4 MG CAPS Take 0.4 mg by mouth at bedtime.    [provider]    Family History Family History  Problem Relation Age of Onset   Heart disease Mother    Heart disease Sister    Heart disease Brother    Heart disease Maternal Grandfather    Ovarian cancer Paternal Grandmother    Cancer Paternal Grandmother        cervical   Colon cancer Neg Hx    Esophageal cancer Neg Hx    Stomach cancer Neg Hx     Social History Social History   Tobacco Use   Smoking status: Former    Current packs/day: 0.00    Average packs/day: 1 pack/day for 22.0 years (22.0 ttl pk-yrs)    Types: Cigarettes, Pipe    Start date: 12/17/1992    Quit date: 12/17/2014    Years since quitting: 9.7   Smokeless tobacco: Never  Substance Use Topics   Alcohol use: No    Alcohol/week: 0.0 standard drinks of alcohol   Drug use: No     Allergies   Kiwi extract, Statins,  Barbiturates, Bismuth subsalicylate, Codeine, Morphine and codeine, Tape, and Altace [ramipril]   Review of Systems Review of Systems  Constitutional:  Negative for fever.  HENT:  Positive for congestion, rhinorrhea and sneezing. Negative for ear pain.   Respiratory:  Positive for cough. Negative for shortness of breath and wheezing.      Physical Exam Triage Vital Signs ED Triage Vitals  Encounter Vitals Group     BP      Girls Systolic BP Percentile      Girls Diastolic BP Percentile      Boys Systolic BP Percentile      Boys Diastolic BP Percentile      Pulse      Resp      Temp      Temp src      SpO2      Weight      Height      Head Circumference      Peak Flow      Pain Score      Pain Loc      Pain Education      Exclude from Growth Chart    No data found.  Updated Vital Signs BP (!) 141/76 (BP Location: Right Arm)   Pulse 60   Temp 98 F (36.7 C) (Oral)   Resp 20   Wt 153 lb 3.2 oz (69.5 kg)   SpO2 94%   BMI 23.29 kg/m   Visual Acuity Right Eye Distance:   Left Eye Distance:   Bilateral Distance:    Right Eye Near:   Left Eye Near:    Bilateral Near:     Physical Exam Vitals and nursing note reviewed.  Constitutional:      Appearance: Normal appearance. He is not ill-appearing.  HENT:     Head: Normocephalic and atraumatic.     Right Ear: Tympanic membrane, ear canal and external ear normal. There is no impacted cerumen.     Left Ear: Tympanic membrane, ear canal and external ear normal. There is no impacted cerumen.     Nose: Congestion and rhinorrhea present.     Comments: His mucosa is erythematous and edematous with clear discharge in both nares.    Mouth/Throat:     Mouth: Mucous membranes are moist.     Pharynx: Oropharynx is clear. Posterior oropharyngeal erythema present. No oropharyngeal exudate.     Comments: Mild erythema to the posterior oropharynx with clear postnasal drip. Cardiovascular:     Rate and Rhythm: Normal rate  and regular rhythm.     Pulses: Normal pulses.     Heart sounds: Normal heart sounds. No murmur heard.    No friction rub. No gallop.  Pulmonary:     Effort: Pulmonary effort is normal.     Breath sounds: Normal breath sounds. No wheezing, rhonchi or rales.  Musculoskeletal:     Cervical back: Normal range of motion and neck supple. No tenderness.  Lymphadenopathy:     Cervical: No cervical adenopathy.  Skin:    General: Skin is warm and dry.     Capillary Refill: Capillary refill takes less than 2 seconds.     Findings: No rash.  Neurological:     General: No focal deficit present.     Mental Status: He is alert and oriented to person, place, and time.      UC Treatments / Results  Labs (all labs ordered are listed, but only abnormal results are displayed) Labs Reviewed  RESP PANEL BY RT-PCR (FLU A&B, COVID) [id number]    EKG   Radiology No results found.  Procedures Procedures (including critical care  time)  Medications Ordered in UC Medications - No data to display  Initial Impression / Assessment and Plan / UC Course  I have reviewed the triage vital signs and the nursing notes.  Pertinent labs & imaging results that were available during my care of the patient were reviewed by me and considered in my medical decision making (see chart for details).   Patient is a nontoxic-appearing 78 year old male presenting for evaluation of 5 days with a respiratory symptoms as outlined HPI above.  He is not in any acute respiratory distress and he is able to speak in full sentences without dyspnea or tachypnea.  Respiratory rate at triage was 20 with a 94% room air oxygen saturation.  He is afebrile with an oral temp of 98.  His physical exam does reveal inflammation of his upper respiratory tract as evidenced by inflamed nasal mucosa with clear nasal discharge.  He also has erythema to the posterior oropharynx with clear postnasal drip.  Cardiopulmonary exam reveals clear lung  sounds in all fields.  Differential diagnose include COVID, influenza, viral respiratory illness.  I will order a COVID and flu PCR.  Respiratory panel is negative for COVID influenza.  I will discharge patient with a diagnosis of viral URI with a cough.  I will prescribe Atrovent nasal spray for nasal congestion and postnasal drip and Tessalon Perles and Promethazine  DM cough syrup for cough and congestion.  He may use over-the-counter Tylenol  as needed for fever or pain.   Final Clinical Impressions(s) / UC Diagnoses   Final diagnoses:  Viral URI with cough     Discharge Instructions      Your testing today was negative for COVID or influenza.  Your exam is consistent with a viral respiratory infection.  Use over-the-counter Tylenol  according to the package instructions as needed for any pain or if you develop a fever.  Use the Atrovent nasal spray, 2 squirts in each nostril every 6 hours, as needed for runny nose and postnasal drip.  Use the Tessalon Perles every 8 hours during the day.  Take them with a small sip of water.  They may give you some numbness to the base of your tongue or a metallic taste in your mouth, this is normal.  Use the Promethazine  DM cough syrup at bedtime for cough and congestion.  It will make you drowsy so do not take it during the day.  Return for reevaluation or see your primary care provider for any new or worsening symptoms.      ED Prescriptions     Medication Sig Dispense Auth. Provider   benzonatate (TESSALON) 100 MG capsule Take 2 capsules (200 mg total) by mouth every 8 (eight) hours. 21 capsule Bernardino Ditch, NP   ipratropium (ATROVENT) 0.06 % nasal spray Place 2 sprays into both nostrils 4 (four) times daily. 15 mL Bernardino Ditch, NP   promethazine -dextromethorphan (PROMETHAZINE -DM) 6.25-15 MG/5ML syrup Take 5 mLs by mouth 4 (four) times daily as needed. 118 mL Bernardino Ditch, NP      PDMP not reviewed this encounter.   Bernardino Ditch,  NP 09/12/24 [phone]

## 2024-09-12 NOTE — Discharge Instructions (Addendum)
 Your testing today was negative for COVID or influenza.  Your exam is consistent with a viral respiratory infection.  Use over-the-counter Tylenol  according to the package instructions as needed for any pain or if you develop a fever.  Use the Atrovent nasal spray, 2 squirts in each nostril every 6 hours, as needed for runny nose and postnasal drip.  Use the Tessalon Perles every 8 hours during the day.  Take them with a small sip of water.  They may give you some numbness to the base of your tongue or a metallic taste in your mouth, this is normal.  Use the Promethazine  DM cough syrup at bedtime for cough and congestion.  It will make you drowsy so do not take it during the day.  Return for reevaluation or see your primary care provider for any new or worsening symptoms.

## 2024-10-29 NOTE — Progress Notes (Signed)
 Cardiology Office Note:    Date:  11/04/2024   ID:  [name], DOB 04-14-1946, MRN [id number]  PCP:  [name]., MD   [facility] Providers Cardiologist:  Corianna Avallone, MD {   Referring MD: [name]., MD   Chief Complaint  Patient presents with   Coronary Artery Disease    History of Present Illness:    [name] is a 78 y.o. male with a hx of CAD s/p CABG 12/2019 (LIMA to LAD, SVG to diagonal, SVG to OM1 and SVG to PDA), postop atrial fibrillation, hypertension, hyperlipidemia and DM2.  He had a STEMI in April 2021 that required stenting of OM 2 and distal left circumflex artery, SVG to OM and PDA were occluded, he did have 70% proximal RCA and a diffusely diseased PLA branch that was filled with left-to-right collaterals, this was managed medically.  Postprocedure, he was placed on aspirin  and Plavix .  Anticoagulation therapy and amiodarone  were discontinued as his A. fib only occurred after bypass surgery.  He was placed on Praluent  in May 2021 for hyperlipidemia.  He had underwent another cardiac catheterization in March 2022, this demonstrated restenosis in the stent of the OM this was treated with a scoring balloon angioplasty, proximal RCA was also stented, LAD distal left circumflex artery stent was patent.  Unfortunately patient went back to the hospital on 03/30/2021 with recurrent chest pain.  Serial troponin was only borderline elevated.  His symptom was concerning for unstable angina.  Repeat cardiac catheterization performed on 03/31/2021 showed 80% mid LAD, 100% mid LAD occlusion, 99% OM2 lesion treated with DES x2, EF 55 to 65%, widely patent distal left circumflex stent.  It is recommended he continue dual antiplatelet therapy indefinitely. [name]   He was seen on 02/26/2024, EKG at the time showed atrial fibrillation with a controlled heart rate of 69 bpm.  He had no cardiac awareness.   He had some lower extremity edema and started on 20 mg daily of  Lasix .  He had significant urine output after only 3 days of diuretic and his lower extremity edema has largely resolved.  Renal function and the electrolyte were stable.  The red blood cell count normal.  His aspirin  was discontinued, he was started on Eliquis  5 mg twice a day.  We continued his Plavix  given previous recommendation of continuing antiplatelet for life.  Subsequent echocardiogram obtained on 03/26/2024 showed EF 55 to 60%, mild MR, mild to moderate TR, trivial AI.  No significant change when compared to the previous study.  Moderately dilated biatrial size. On subsequent follow up DCCV was discussed but deferred.   On follow up today he is doing OK. Dementia is advancing. Still putters around the yard some but not very active. Denies any chest pain or SOB. Amlodipine  was stopped due to ankle edema and this hasn't really helped. On Tresiba and Mounjaro now with significant improvement in A1c. Was prescribed donazepril by Neuro but Dr Loreli concerned about low HR. Was 51 when seen by him.   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   Urinary hesitancy     Past Surgical History:  Procedure Laterality Date   ANKLE ARTHROPLASTY  2006   CARDIOVASCULAR STRESS TEST  01/22/2009   EF 51%, NO ISCHEMIA   CARDIOVASCULAR STRESS TEST     COLONOSCOPY     CORONARY ANGIOPLASTY WITH STENT PLACEMENT  03/2005   CORONARY ARTERY BYPASS GRAFT N/A 12/20/2019   Procedure: CORONARY ARTERY BYPASS  GRAFTING (CABG) times four, using left internal mammary artery and left greater saphenous vein;  Surgeon: Fleeta Hanford Coy, MD;  Location: [facility] OR;  Service: Open Heart Surgery;  Laterality: N/A;   CORONARY BALLOON ANGIOPLASTY N/A 02/09/2021   Procedure: CORONARY BALLOON ANGIOPLASTY;  Surgeon: Zykerria Tanton M, MD;  Location: [facility] INVASIVE CV LAB;  Service: Cardiovascular;  Laterality: N/A;   CORONARY STENT INTERVENTION N/A 03/16/2020   Procedure: CORONARY STENT INTERVENTION;  Surgeon: Emiline Mancebo M, MD;  Location: [facility] INVASIVE CV LAB;  Service: Cardiovascular;  Laterality: N/A;   CORONARY STENT INTERVENTION N/A 02/09/2021   Procedure: CORONARY STENT INTERVENTION;  Surgeon: [name], MD;  Location: [facility] INVASIVE CV LAB;  Service: Cardiovascular;  Laterality: N/A;   CORONARY STENT INTERVENTION N/A 03/31/2021   Procedure: CORONARY STENT INTERVENTION;  Surgeon: [name], MD;  Location: [facility] INVASIVE CV LAB;  Service: Cardiovascular;  Laterality: N/A;   CORONARY STENT PLACEMENT  03/10/2005   SUCCESSFUL STENTING OF THE MID AND CRUX OF THE RCA   CORONARY/GRAFT ACUTE MI REVASCULARIZATION N/A 03/16/2020   Procedure: Coronary/Graft Acute MI Revascularization;  Surgeon: [name], MD;  Location: [facility] INVASIVE CV LAB;  Service: Cardiovascular;  Laterality: N/A;   ESOPHAGEAL DILATION     multiple procedures   HERNIA REPAIR  11/30/2005   umbilical hernia   INGUINAL HERNIA REPAIR  11/28/2011   Procedure: HERNIA REPAIR INGUINAL ADULT;  Surgeon: [name], MD;  Location: [facility] OR;  Service: General;  Laterality: Right;  right inguinal hernia repair with mesh   KNEE SURGERY     Right   LEFT HEART CATH AND CORONARY ANGIOGRAPHY N/A 12/03/2019   Procedure: LEFT HEART CATH AND CORONARY ANGIOGRAPHY;  Surgeon: [name], MD;  Location: [facility] INVASIVE CV LAB;  Service: Cardiovascular;  Laterality: N/A;   LEFT HEART CATH AND CORS/GRAFTS ANGIOGRAPHY N/A 03/16/2020   Procedure: LEFT HEART CATH AND  CORS/GRAFTS ANGIOGRAPHY;  Surgeon: Raiden Yearwood M, MD;  Location: [facility] INVASIVE CV LAB;  Service: Cardiovascular;  Laterality: N/A;   LEFT HEART CATH AND CORS/GRAFTS ANGIOGRAPHY N/A 02/09/2021   Procedure: LEFT HEART CATH AND CORS/GRAFTS ANGIOGRAPHY;  Surgeon: Merit Maybee M, MD;  Location: [facility] INVASIVE CV LAB;  Service: Cardiovascular;  Laterality: N/A;   LEFT HEART CATH AND CORS/GRAFTS ANGIOGRAPHY N/A 03/31/2021   Procedure: LEFT HEART CATH AND CORS/GRAFTS ANGIOGRAPHY;  Surgeon: [name], MD;  Location: [facility] INVASIVE CV LAB;  Service: Cardiovascular;  Laterality: N/A;   POLYPECTOMY     TEE WITHOUT CARDIOVERSION N/A 12/20/2019   Procedure: TRANSESOPHAGEAL ECHOCARDIOGRAM (TEE);  Surgeon: Fleeta Hanford, Coy, MD;  Location: [facility] OR;  Service: Open Heart Surgery;  Laterality: N/A;   TONSILLECTOMY  1958   UMBILICAL HERNIA REPAIR  11/2006   US  ECHOCARDIOGRAPHY      Current Medications: Current Meds  Medication Sig   acetaminophen  (TYLENOL ) 650 MG CR tablet Take 1,300 mg by mouth every 8 (eight) hours as needed for pain.   B Complex-C (B-COMPLEX WITH VITAMIN C) tablet Take 1 tablet by mouth daily with breakfast.    benzonatate  (TESSALON ) 100 MG capsule Take 2 capsules (200 mg total) by mouth every 8 (eight) hours.   Calcium -Magnesium -Zinc  (CAL-MAG-ZINC  PO) Take 1 tablet by mouth daily with lunch.   cetirizine (ZYRTEC) 10 MG tablet Take 10 mg by mouth in the morning.   donepezil  (ARICEPT ) 5 MG tablet Take 1 tablet (5 mg total) by mouth daily.   empagliflozin  (JARDIANCE ) 25 MG TABS tablet Take 1 tablet (25 mg total) by mouth in the morning.   finasteride (PROSCAR) 5 MG tablet Take 5 mg by mouth daily.   fluticasone  (FLONASE ) 50 MCG/ACT nasal spray Place 2 sprays into both nostrils daily as needed for allergies.   insulin  degludec (TRESIBA  FLEXTOUCH) 100 UNIT/ML FlexTouch Pen Inject 10 Units into the skin daily.   ipratropium (ATROVENT ) 0.06 % nasal spray Place 2 sprays into both nostrils 4 (four)  times daily.   memantine  (NAMENDA ) 10 MG tablet Take 1 tablet (10 mg total) by mouth 2 (two) times daily.   metoprolol  succinate (TOPROL  XL) 25 MG 24 hr tablet Take 1 tablet (25 mg total) by mouth daily.   nitroGLYCERIN  (NITROSTAT ) 0.4 MG SL tablet Place 1 tablet (0.4 mg total) under the tongue every 5 (five) minutes as needed for chest pain.   OneTouch Delica Lancets 30G MISC 1 each by Other route daily.   ONETOUCH VERIO test strip 1 each by Other route daily.   pantoprazole  (PROTONIX ) 40 MG tablet Take 1 tablet (40 mg total) by mouth 2 (two) times daily.   promethazine -dextromethorphan (PROMETHAZINE -DM) 6.25-15 MG/5ML syrup Take 5 mLs by mouth 4 (four) times daily as needed.   Tamsulosin  HCl (FLOMAX ) 0.4 MG CAPS Take 0.4 mg by mouth at bedtime.   tirzepatide  (MOUNJARO ) 2.5 MG/0.5ML Pen Inject 2.5 mg into the skin once a week.   [DISCONTINUED] apixaban  (ELIQUIS ) 5 MG TABS tablet Take 1 tablet (5 mg total) by mouth 2 (two) times daily.   [DISCONTINUED] clopidogrel  (PLAVIX ) 75 MG tablet Take 1 tablet (75 mg total) by mouth daily with breakfast.   [DISCONTINUED] Evolocumab  (REPATHA  SURECLICK) 140 MG/ML SOAJ Inject 140 mg into the skin every 14 (fourteen) days.   [DISCONTINUED] furosemide  (LASIX ) 20 MG tablet Take 1 tablet (20 mg total) by mouth daily.   [DISCONTINUED] isosorbide  mononitrate (IMDUR ) 60 MG 24 hr tablet Take 1 tablet (60 mg total) by mouth daily.   [DISCONTINUED] metoprolol  tartrate (LOPRESSOR ) 25 MG tablet Take 1 tablet (25 mg total) by mouth 2 (two) times daily.   [DISCONTINUED] OZEMPIC, 0.25 OR 0.5 MG/DOSE, 2 MG/3ML SOPN Inject into the skin.     Allergies:   Kiwi extract, Statins, Barbiturates, Bismuth subsalicylate, Codeine, Morphine and codeine, Tape, and Altace [ramipril]   Social History   Socioeconomic History   Marital status: Married    Spouse name: Not on file   Number of children: 3   Years of education: Not on file   Highest education level: Not on file   Occupational History   Occupation: Retired  Tobacco Use   Smoking status: Former    Current packs/day: 0.00    Average packs/day: 1 pack/day for 22.0 years (22.0 ttl pk-yrs)    Types: Cigarettes, Pipe    Start date: 12/17/1992    Quit date: 12/17/2014  Years since quitting: 9.8   Smokeless tobacco: Never  Substance and Sexual Activity   Alcohol use: No    Alcohol/week: 0.0 standard drinks of alcohol   Drug use: No   Sexual activity: Not on file  Other Topics Concern   Not on file  Social History Narrative   Right handed   Drinks caffeine   One story home   Loves cats   Retired   Lives with wife   Social Drivers of Corporate Investment Banker Strain: Not on file  Food Insecurity: Not on file  Transportation Needs: Not on file  Physical Activity: Not on file  Stress: Not on file  Social Connections: Not on file     Family History: The patient's family history includes Cancer in his paternal grandmother; Heart disease in his brother, maternal grandfather, mother, and sister; Ovarian cancer in his paternal grandmother. There is no history of Colon cancer, Esophageal cancer, or Stomach cancer.  ROS:   Please see the history of present illness.     All other systems reviewed and are negative.  EKGs/Labs/Other Studies Reviewed:    The following studies were reviewed today:  Cath 03/31/2021 Mid LAD-1 lesion is 80% stenosed. Prox LAD lesion is 25% stenosed. Mid LAD-2 lesion is 100% stenosed. 2nd Mrg-2 lesion is 99% stenosed. RPAV lesion is 99% stenosed. Mid RCA lesion is 30% stenosed. Dist RCA lesion is 30% stenosed. Previously placed Prox RCA drug eluting stent is widely patent. The left ventricular systolic function is normal. LV end diastolic pressure is mildly elevated. The left ventricular ejection fraction is 55-65% by visual estimate. Previously placed Dist Cx stent (unknown type) is widely patent. Mid Cx to Dist Cx lesion is 30% stenosed. Post  intervention, there is a 0% residual stenosis. A drug-eluting stent was successfully placed using a SYNERGY XD 2.50X12. 2nd Mrg-1 lesion is 90% stenosed. Post intervention, there is a 0% residual stenosis. A drug-eluting stent was successfully placed using a SYNERGY XD 2.75X24. Lat 2nd Mrg lesion is 50% stenosed.   1.  Significant underlying three-vessel coronary artery disease with patent LIMA to LAD and patent SVG to diagonal.  Chronically occluded SVG to OM 2 and SVG to right PDA.  Patent RCA stents with mild in-stent restenosis in the mid and distal stents.  Patent distal left circumflex stent with minimal restenosis.  Severe in-stent restenosis in the proximal as well as distal stent in a large OM2. 2.  Normal LV systolic function mildly elevated left ventricular end-diastolic pressure. 3.  Successful OCT guided PCI and drug-eluting stent placement to the distal and proximal OM2.  The distal stent did not cover the whole length of the previously placed stent as I was concerned about small vessel size distally and in order not to jail the inferior branch which was diseased.  I elected to restent with different platform given the quick restenosis after recent cath and balloon angioplasty.   Recommendations: Continue dual antiplatelet therapy indefinitely if possible. Aggressive treatment of risk factors. Hydrate overnight given underlying chronic kidney disease.  Echo 03/26/24: IMPRESSIONS     1. Left ventricular ejection fraction, by estimation, is 55 to 60%. The  left ventricle has normal function. The left ventricle demonstrates  regional wall motion abnormalities (see scoring diagram/findings for  description). Left ventricular diastolic  function could not be evaluated.   2. Right ventricular systolic function is mildly reduced. The right  ventricular size is mildly enlarged.   3. Left atrial size was moderately  dilated.   4. Right atrial size was moderately dilated.   5. The  mitral valve is normal in structure. Mild mitral valve  regurgitation. No evidence of mitral stenosis.   6. Tricuspid valve regurgitation is mild to moderate.   7. The aortic valve is normal in structure. Aortic valve regurgitation is  trivial. No aortic stenosis is present.   8. The inferior vena cava is normal in size with greater than 50%  respiratory variability, suggesting right atrial pressure of 3 mmHg.   Comparison(s): No significant change from prior study. Prior images  reviewed side by side. Retrospectively, a similar area of basal inferior  and inferoseptal hypokinesis was also present in 2021.     Recent Labs: 03/01/2024: BUN 20; Creatinine, Ser 1.31; Hemoglobin 14.5; Platelets 192; Potassium 3.8; Sodium 144  Recent Lipid Panel    Component Value Date/Time   CHOL 109 03/31/2021 0240   CHOL 122 02/01/2021 1136   TRIG 143 03/31/2021 0240   HDL 46 03/31/2021 0240   HDL 53 02/01/2021 1136   CHOLHDL 2.4 03/31/2021 0240   VLDL 29 03/31/2021 0240   LDLCALC 34 03/31/2021 0240   LDLCALC 50 02/01/2021 1136   Dated 04/29/21: cholesterol 110, triglycerides 83, HDL 50, LDL 43.  Dated 09/09/21: A1c 8.3%/  Dated 05/17/22: cholesterol 134, triglycerides 98, HDL 59, LDL 55. Acc 9%. CMET normal Dated 09/08/22: A1c 8.2% Dated 09/17/24: cholesterol 93, triglycerides 66, HDL 47. LDL 33. Glucose 167. CMET normal. CBC normal. TSH normal. A1c 8.9%.   Risk Assessment/Calculations:    CHA2DS2-VASc Score = 5  This indicates a 7.2% annual risk of stroke. The patient's score is based upon: CHF History: 0 HTN History: 1 Diabetes History: 1 Stroke History: 0 Vascular Disease History: 1 Age Score: 2 Gender Score: 0       Physical Exam:    VS:  BP (!) 109/48   Pulse 68   Ht 5' 8 (1.727 m)   Wt 158 lb (71.7 kg)   SpO2 98%   BMI 24.02 kg/m     Wt Readings from Last 3 Encounters:  11/04/24 158 lb (71.7 kg)  09/12/24 153 lb 3.2 oz (69.5 kg)  08/29/24 150 lb (68 kg)     GEN:   Well nourished, well developed in no acute distress HEENT: Normal NECK: No JVD; No carotid bruits LYMPHATICS: No lymphadenopathy CARDIAC: RRR, no murmurs, rubs, gallops RESPIRATORY:  Clear to auscultation without rales, wheezing or rhonchi  ABDOMEN: Soft, non-tender, non-distended MUSCULOSKELETAL:  1-2+ edema; No deformity  SKIN: Warm and dry NEUROLOGIC:  Alert and oriented x 3 PSYCHIATRIC:  Normal affect   ASSESSMENT:    1. Persistent atrial fibrillation (HCC)   2. Coronary artery disease of bypass graft of native heart with stable angina pectoris   3. S/P CABG x 4   4. Essential hypertension   5. Leg edema         PLAN:    In order of problems listed above:  CAD s/p CABG 12/2019 with subsequent PCI/DES to OM2 x2 and LCx x1 03/16/20 with cardiac cath showing occlusion of SVG to OM1 and SVG to PDA: patent LIMA to LAD and SVG to diagonal, s/p scoring balloon PCI of the OM2 for restenosis on 02/09/21. PCI of the proximal RCA with DES.  Then underwent DES x2 to distal and proximal OM 2 in late April 2022 for restenosis.   He has done well since then with stable class 1-2 angina.  Continue  Plavix  indefinitely.  On Imdur , beta blocker. Prescriptions refilled.   Atrial fibrillation - recurred and persistent. Rate is well controlled if not slow. Will switch metoprolol  tartrate 25 mg bid to Toprol  XL 25 mg daily. He is asymptomatic. On Eliquis .  Hypertension: Blood pressure is well controlled.  Hyperlipidemia: excellent control on Repatha   DM2: per PCP. Improved control on Tresiba and Mounjaro  6.   Memory loss/dementia. Followed by Neuro. With reduction in metoprolol  dose should be ok to try donazepril. Could always stop metoprolol  if needed.  7.   Edema. Echo ok. No improvement off amlodpine. Continue lasix  and restrict salt intake.   I will follow up in 6 months.   Signed, Titianna Loomis, MD  11/04/2024 1:32 PM    [facility]

## 2024-11-04 ENCOUNTER — Telehealth: Payer: Self-pay | Admitting: Cardiology

## 2024-11-04 ENCOUNTER — Ambulatory Visit: Attending: Cardiology | Admitting: Cardiology

## 2024-11-04 ENCOUNTER — Encounter: Payer: Self-pay | Admitting: Cardiology

## 2024-11-04 VITALS — BP 109/48 | HR 68 | Ht 68.0 in | Wt 158.0 lb

## 2024-11-04 DIAGNOSIS — Z951 Presence of aortocoronary bypass graft: Secondary | ICD-10-CM

## 2024-11-04 DIAGNOSIS — I10 Essential (primary) hypertension: Secondary | ICD-10-CM | POA: Diagnosis not present

## 2024-11-04 DIAGNOSIS — R600 Localized edema: Secondary | ICD-10-CM

## 2024-11-04 DIAGNOSIS — I25708 Atherosclerosis of coronary artery bypass graft(s), unspecified, with other forms of angina pectoris: Secondary | ICD-10-CM

## 2024-11-04 DIAGNOSIS — I4819 Other persistent atrial fibrillation: Secondary | ICD-10-CM | POA: Diagnosis not present

## 2024-11-04 MED ORDER — MOUNJARO 2.5 MG/0.5ML ~~LOC~~ SOAJ: 2.5000 mg | SUBCUTANEOUS | Status: AC

## 2024-11-04 MED ORDER — CLOPIDOGREL BISULFATE 75 MG PO TABS: 75.0000 mg | ORAL_TABLET | Freq: Every day | ORAL | 3 refills | Status: AC

## 2024-11-04 MED ORDER — APIXABAN 5 MG PO TABS: 5.0000 mg | ORAL_TABLET | Freq: Two times a day (BID) | ORAL | 3 refills | Status: AC

## 2024-11-04 MED ORDER — TRESIBA FLEXTOUCH 100 UNIT/ML ~~LOC~~ SOPN: 10.0000 [IU] | PEN_INJECTOR | Freq: Every day | SUBCUTANEOUS | Status: AC

## 2024-11-04 MED ORDER — METOPROLOL SUCCINATE ER 25 MG PO TB24: 25.0000 mg | ORAL_TABLET | Freq: Every day | ORAL | 3 refills | Status: AC

## 2024-11-04 MED ORDER — REPATHA SURECLICK 140 MG/ML ~~LOC~~ SOAJ: 140.0000 mg | SUBCUTANEOUS | 3 refills | Status: AC

## 2024-11-04 MED ORDER — FUROSEMIDE 20 MG PO TABS: 20.0000 mg | ORAL_TABLET | Freq: Every day | ORAL | 3 refills | Status: AC

## 2024-11-04 MED ORDER — ISOSORBIDE MONONITRATE ER 60 MG PO TB24: 60.0000 mg | ORAL_TABLET | Freq: Every day | ORAL | 3 refills | Status: AC

## 2024-11-04 NOTE — Telephone Encounter (Signed)
  Pt c/o medication issue:  1. Name of Medication: apixaban  (ELIQUIS ) 5 MG TABS tablet  clopidogrel  (PLAVIX ) 75 MG tablet   2. How are you currently taking this medication (dosage and times per day)? As written  3. Are you having a reaction (difficulty breathing--STAT)? No   4. What is your medication issue? Megan from [facility] calling to clarify if the patient does need to take both of these medications

## 2024-11-04 NOTE — Patient Instructions (Signed)
 Medication Instructions:  Stop Metoprolol  Tartrate  Start Toprol  XL 25 mg daily Continue all other medications *If you need a refill on your cardiac medications before your next appointment, please call your pharmacy*  Lab Work: None ordered  Testing/Procedures: None ordered  Follow-Up: At [facility], you and your health needs are our priority.  As part of our continuing mission to provide you with exceptional heart care, our providers are all part of one team.  This team includes your primary Cardiologist (physician) and Advanced Practice Providers or APPs (Physician Assistants and Nurse Practitioners) who all work together to provide you with the care you need, when you need it.  Your next appointment:  6 months   Call in March to schedule June appointment     Provider:  Dr.Jordan    We recommend signing up for the patient portal called MyChart.  Sign up information is provided on this After Visit Summary.  MyChart is used to connect with patients for Virtual Visits (Telemedicine).  Patients are able to view lab/test results, encounter notes, upcoming appointments, etc.  Non-urgent messages can be sent to your provider as well.   To learn more about what you can do with MyChart, go to [email or web address].

## 2024-11-04 NOTE — Telephone Encounter (Signed)
 Confirmed with Megan that Dr Candee HARPER note addresses that pt should be on both.

## 2025-02-26 ENCOUNTER — Ambulatory Visit: Admitting: Physician Assistant
